# Patient Record
Sex: Male | Born: 1960 | Race: White | Hispanic: No | Marital: Single | Smoking: Former smoker
Health system: Southern US, Community
[De-identification: ages and names within clinical notes are randomized; demographics above are authoritative.]

## PROBLEM LIST (undated history)

## (undated) DIAGNOSIS — F1010 Alcohol abuse, uncomplicated: Secondary | ICD-10-CM

## (undated) DIAGNOSIS — I10 Essential (primary) hypertension: Secondary | ICD-10-CM

## (undated) DIAGNOSIS — I499 Cardiac arrhythmia, unspecified: Secondary | ICD-10-CM

## (undated) DIAGNOSIS — E785 Hyperlipidemia, unspecified: Secondary | ICD-10-CM

## (undated) DIAGNOSIS — K769 Liver disease, unspecified: Secondary | ICD-10-CM

## (undated) DIAGNOSIS — R000 Tachycardia, unspecified: Secondary | ICD-10-CM

## (undated) DIAGNOSIS — E039 Hypothyroidism, unspecified: Secondary | ICD-10-CM

## (undated) DIAGNOSIS — F419 Anxiety disorder, unspecified: Secondary | ICD-10-CM

## (undated) DIAGNOSIS — T7840XA Allergy, unspecified, initial encounter: Secondary | ICD-10-CM

## (undated) DIAGNOSIS — Z87442 Personal history of urinary calculi: Secondary | ICD-10-CM

## (undated) DIAGNOSIS — D510 Vitamin B12 deficiency anemia due to intrinsic factor deficiency: Secondary | ICD-10-CM

## (undated) DIAGNOSIS — K8020 Calculus of gallbladder without cholecystitis without obstruction: Secondary | ICD-10-CM

## (undated) DIAGNOSIS — M1990 Unspecified osteoarthritis, unspecified site: Secondary | ICD-10-CM

## (undated) DIAGNOSIS — F329 Major depressive disorder, single episode, unspecified: Secondary | ICD-10-CM

## (undated) DIAGNOSIS — F32A Depression, unspecified: Secondary | ICD-10-CM

## (undated) DIAGNOSIS — C189 Malignant neoplasm of colon, unspecified: Secondary | ICD-10-CM

## (undated) DIAGNOSIS — E538 Deficiency of other specified B group vitamins: Secondary | ICD-10-CM

## (undated) DIAGNOSIS — I483 Typical atrial flutter: Secondary | ICD-10-CM

## (undated) DIAGNOSIS — K7460 Unspecified cirrhosis of liver: Secondary | ICD-10-CM

## (undated) DIAGNOSIS — N200 Calculus of kidney: Secondary | ICD-10-CM

## (undated) DIAGNOSIS — D649 Anemia, unspecified: Secondary | ICD-10-CM

## (undated) DIAGNOSIS — R12 Heartburn: Secondary | ICD-10-CM

## (undated) DIAGNOSIS — R569 Unspecified convulsions: Secondary | ICD-10-CM

## (undated) HISTORY — DX: Hypothyroidism, unspecified: E03.9

## (undated) HISTORY — DX: Unspecified osteoarthritis, unspecified site: M19.90

## (undated) HISTORY — DX: Hyperlipidemia, unspecified: E78.5

## (undated) HISTORY — DX: Tachycardia, unspecified: R00.0

## (undated) HISTORY — DX: Anxiety disorder, unspecified: F41.9

## (undated) HISTORY — PX: MINOR CARPAL TUNNEL: SHX6472

## (undated) HISTORY — DX: Allergy, unspecified, initial encounter: T78.40XA

## (undated) HISTORY — DX: Heartburn: R12

## (undated) HISTORY — DX: Vitamin B12 deficiency anemia due to intrinsic factor deficiency: D51.0

## (undated) HISTORY — DX: Deficiency of other specified B group vitamins: E53.8

## (undated) HISTORY — DX: Malignant neoplasm of colon, unspecified: C18.9

## (undated) HISTORY — DX: Typical atrial flutter: I48.3

## (undated) HISTORY — DX: Unspecified convulsions: R56.9

## (undated) HISTORY — DX: Unspecified cirrhosis of liver: K74.60

## (undated) HISTORY — DX: Liver disease, unspecified: K76.9

---

## 2003-08-22 ENCOUNTER — Emergency Department (HOSPITAL_COMMUNITY): Admission: EM | Admit: 2003-08-22 | Discharge: 2003-08-22 | Payer: Self-pay | Admitting: *Deleted

## 2008-10-19 ENCOUNTER — Emergency Department (HOSPITAL_COMMUNITY): Admission: EM | Admit: 2008-10-19 | Discharge: 2008-10-19 | Payer: Self-pay | Admitting: Emergency Medicine

## 2010-05-03 LAB — LITHIUM LEVEL: Lithium Lvl: 1.31 mEq/L (ref 0.80–1.40)

## 2010-05-03 LAB — URINALYSIS, ROUTINE W REFLEX MICROSCOPIC
Bilirubin Urine: NEGATIVE
Ketones, ur: NEGATIVE mg/dL
Nitrite: NEGATIVE
Protein, ur: NEGATIVE mg/dL
Urobilinogen, UA: 1 mg/dL (ref 0.0–1.0)

## 2010-05-03 LAB — RAPID URINE DRUG SCREEN, HOSP PERFORMED
Cocaine: NOT DETECTED
Tetrahydrocannabinol: NOT DETECTED

## 2012-06-16 ENCOUNTER — Other Ambulatory Visit: Payer: Self-pay | Admitting: Neurology

## 2012-06-16 DIAGNOSIS — G35 Multiple sclerosis: Secondary | ICD-10-CM

## 2012-06-23 ENCOUNTER — Ambulatory Visit (HOSPITAL_COMMUNITY)
Admission: RE | Admit: 2012-06-23 | Discharge: 2012-06-23 | Disposition: A | Discharge status: Home / Self Care | Payer: Self-pay | Source: Ambulatory Visit | Attending: Neurology | Admitting: Neurology

## 2012-06-23 ENCOUNTER — Encounter (HOSPITAL_COMMUNITY): Payer: Self-pay

## 2012-06-23 DIAGNOSIS — G9389 Other specified disorders of brain: Secondary | ICD-10-CM | POA: Insufficient documentation

## 2012-06-23 DIAGNOSIS — G35 Multiple sclerosis: Secondary | ICD-10-CM

## 2012-06-23 DIAGNOSIS — R209 Unspecified disturbances of skin sensation: Secondary | ICD-10-CM | POA: Insufficient documentation

## 2013-11-03 ENCOUNTER — Other Ambulatory Visit (HOSPITAL_COMMUNITY): Payer: Self-pay | Admitting: Respiratory Therapy

## 2013-11-03 DIAGNOSIS — G4730 Sleep apnea, unspecified: Secondary | ICD-10-CM

## 2015-03-03 ENCOUNTER — Encounter (HOSPITAL_COMMUNITY): Payer: Self-pay | Admitting: Emergency Medicine

## 2015-03-03 ENCOUNTER — Emergency Department (HOSPITAL_COMMUNITY)
Admission: EM | Admit: 2015-03-03 | Discharge: 2015-03-03 | Disposition: A | Discharge status: Home / Self Care | Payer: Self-pay | Attending: Emergency Medicine | Admitting: Emergency Medicine

## 2015-03-03 DIAGNOSIS — K625 Hemorrhage of anus and rectum: Secondary | ICD-10-CM | POA: Insufficient documentation

## 2015-03-03 DIAGNOSIS — Q232 Congenital mitral stenosis: Secondary | ICD-10-CM | POA: Insufficient documentation

## 2015-03-03 DIAGNOSIS — I10 Essential (primary) hypertension: Secondary | ICD-10-CM | POA: Insufficient documentation

## 2015-03-03 HISTORY — DX: Essential (primary) hypertension: I10

## 2015-03-03 NOTE — ED Notes (Signed)
Having rectal bleeding (blood-tinged) in last year.  Pt has stopping taking all other medications because he has read on the Internet that it causes.  Currently on nothing except Xanax, last dose last night.  Had one blood-tinged stool this am, stool not blood.

## 2015-03-08 ENCOUNTER — Emergency Department (HOSPITAL_COMMUNITY)
Admission: EM | Admit: 2015-03-08 | Discharge: 2015-03-08 | Disposition: A | Discharge status: Home / Self Care | Payer: Self-pay | Attending: Emergency Medicine | Admitting: Emergency Medicine

## 2015-03-08 ENCOUNTER — Encounter (HOSPITAL_COMMUNITY): Payer: Self-pay | Admitting: Emergency Medicine

## 2015-03-08 DIAGNOSIS — Q232 Congenital mitral stenosis: Secondary | ICD-10-CM | POA: Insufficient documentation

## 2015-03-08 DIAGNOSIS — K921 Melena: Secondary | ICD-10-CM | POA: Insufficient documentation

## 2015-03-08 DIAGNOSIS — Z880 Allergy status to penicillin: Secondary | ICD-10-CM | POA: Insufficient documentation

## 2015-03-08 DIAGNOSIS — R0789 Other chest pain: Secondary | ICD-10-CM | POA: Insufficient documentation

## 2015-03-08 DIAGNOSIS — R0602 Shortness of breath: Secondary | ICD-10-CM | POA: Insufficient documentation

## 2015-03-08 DIAGNOSIS — I10 Essential (primary) hypertension: Secondary | ICD-10-CM | POA: Insufficient documentation

## 2015-03-08 DIAGNOSIS — Z79899 Other long term (current) drug therapy: Secondary | ICD-10-CM | POA: Insufficient documentation

## 2015-03-08 DIAGNOSIS — F329 Major depressive disorder, single episode, unspecified: Secondary | ICD-10-CM | POA: Insufficient documentation

## 2015-03-08 DIAGNOSIS — R51 Headache: Secondary | ICD-10-CM | POA: Insufficient documentation

## 2015-03-08 DIAGNOSIS — R05 Cough: Secondary | ICD-10-CM | POA: Insufficient documentation

## 2015-03-08 DIAGNOSIS — R509 Fever, unspecified: Secondary | ICD-10-CM | POA: Insufficient documentation

## 2015-03-08 HISTORY — DX: Major depressive disorder, single episode, unspecified: F32.9

## 2015-03-08 HISTORY — DX: Depression, unspecified: F32.A

## 2015-03-08 LAB — CBC WITH DIFFERENTIAL/PLATELET
BASOS PCT: 0 %
Basophils Absolute: 0 10*3/uL (ref 0.0–0.1)
Eosinophils Absolute: 0.2 10*3/uL (ref 0.0–0.7)
Eosinophils Relative: 3 %
HEMATOCRIT: 43.9 % (ref 39.0–52.0)
HEMOGLOBIN: 15.4 g/dL (ref 13.0–17.0)
LYMPHS ABS: 1.7 10*3/uL (ref 0.7–4.0)
LYMPHS PCT: 29 %
MCH: 31.5 pg (ref 26.0–34.0)
MCHC: 35.1 g/dL (ref 30.0–36.0)
MCV: 89.8 fL (ref 78.0–100.0)
MONOS PCT: 9 %
Monocytes Absolute: 0.5 10*3/uL (ref 0.1–1.0)
NEUTROS ABS: 3.4 10*3/uL (ref 1.7–7.7)
NEUTROS PCT: 59 %
Platelets: 180 10*3/uL (ref 150–400)
RBC: 4.89 MIL/uL (ref 4.22–5.81)
RDW: 12.6 % (ref 11.5–15.5)
WBC: 5.8 10*3/uL (ref 4.0–10.5)

## 2015-03-08 LAB — COMPREHENSIVE METABOLIC PANEL
ALBUMIN: 4 g/dL (ref 3.5–5.0)
ALK PHOS: 81 U/L (ref 38–126)
ALT: 21 U/L (ref 17–63)
ANION GAP: 8 (ref 5–15)
AST: 20 U/L (ref 15–41)
BILIRUBIN TOTAL: 0.6 mg/dL (ref 0.3–1.2)
BUN: 14 mg/dL (ref 6–20)
CALCIUM: 8.5 mg/dL — AB (ref 8.9–10.3)
CO2: 27 mmol/L (ref 22–32)
Chloride: 104 mmol/L (ref 101–111)
Creatinine, Ser: 0.88 mg/dL (ref 0.61–1.24)
GFR calc Af Amer: >60 mL/min (ref >60)
GFR calc non Af Amer: >60 mL/min (ref >60)
GLUCOSE: 95 mg/dL (ref 65–99)
Potassium: 3.3 mmol/L — ABNORMAL LOW (ref 3.5–5.1)
Sodium: 139 mmol/L (ref 135–145)
TOTAL PROTEIN: 6.7 g/dL (ref 6.5–8.1)

## 2015-03-08 LAB — LACTATE DEHYDROGENASE: LDH: 124 U/L (ref 98–192)

## 2015-03-08 NOTE — ED Notes (Signed)
Lab at bedside

## 2015-03-08 NOTE — ED Notes (Signed)
PT c/o bright red blood intermittently x8 months and worsening x7 days. PT also states, "I think I have black mold poisoning bc I have symptoms of headache, blood in stool, chronic nausea, flu like symptoms, body aches for over 6 months."

## 2015-03-08 NOTE — ED Provider Notes (Signed)
CSN: [id number]     Arrival date & time 03/08/15  1031 History  By signing my name below, I, Hansel Feinstein, attest that this documentation has been prepared under the direction and in the presence of Merrily Pew, MD. Electronically Signed: Hansel Feinstein, ED Scribe. 03/08/2015. 11:35 AM.    Chief Complaint  Patient presents with  . Rectal Bleeding    The history is provided by the patient. No language interpreter was used.    HPI Comments: Earl [name] is a 55 y.o. male with h/o congenital mitral stenosis, HTN who presents to the Emergency Department complaining of mild, intermittent bloody stools for 8 months. Last episode yesterday. Pt states he sees a small amount of blood-streaking in his stools and occasionally sees blood on toilet paper. Pt attributes his symptoms to SSRIs and BP medication, and notes resolution of symptoms after he discontinues these medications. He states he is not happy with his BP medication because his BP is occasionally low. Pt has not previously been evaluated for his current symptoms. No h/o hemorrhoids, diverticulosis. No FHx of colon cancer. He also complains of persistent cough, mild SOB, chest tightness with deep inhalation, constant mild HA, subjective fever. He notes these symptoms have been ongoing for 6 months and reports he believes he has been exposed to toxic mold. Pt reports he has seen mold in his home and is in the process of having the mold removed. Pt is not currently followed by a PCP. He is followed by neurology. He denies CP, abdominal pain.   Past Medical History  Diagnosis Date  . MS (congenital mitral stenosis)   . Hypertension   . Irregular heart beat   . Depression    Past Surgical History  Procedure Laterality Date  . Minor carpal tunnel      right   History reviewed. No pertinent family history. Social History  Substance Use Topics  . Smoking status: Former Smoker    Quit date: 03/03/1979  . Smokeless tobacco: None  . Alcohol Use:  4.8 oz/week    8 Cans of beer per week     Comment: drinks heavily at times    Review of Systems  Constitutional: Positive for fever ( subjective).  Respiratory: Positive for cough, chest tightness and shortness of breath.   Cardiovascular: Negative for chest pain.  Gastrointestinal: Positive for blood in stool. Negative for abdominal pain.  Neurological: Positive for headaches.  All other systems reviewed and are negative.  Allergies  Amoxicillin and Asa  Home Medications   Prior to Admission medications   Medication Sig Start Date End Date Taking? Authorizing Provider  ALPRAZolam Duanne Moron) 1 MG tablet Take 1 mg by mouth 4 (four) times daily as needed for anxiety.   Yes Historical Provider, MD  cyanocobalamin (,VITAMIN B-12,) 1000 MCG/ML injection Inject 1,000 mcg into the muscle once.   Yes Historical Provider, MD  gabapentin (NEURONTIN) 600 MG tablet Take 600 mg by mouth 3 (three) times daily.   Yes Historical Provider, MD  Vitamin D, Ergocalciferol, (DRISDOL) 50000 units CAPS capsule Take 50,000 Units by mouth every 7 (seven) days.   Yes Historical Provider, MD  zinc gluconate 50 MG tablet Take 25 mg by mouth once a week.   Yes Historical Provider, MD   BP 116/93 mmHg  Pulse 68  Temp(Src) 97.5 F (36.4 C) (Oral)  Resp 16  Ht 5\' 10"  (1.778 m)  Wt 175 lb (79.379 kg)  BMI 25.11 kg/m2  SpO2 100% Physical Exam  Constitutional: He is oriented to person, place, and time. He appears well-developed and well-nourished.  HENT:  Head: Normocephalic and atraumatic.  Eyes: Conjunctivae and EOM are normal. Pupils are equal, round, and reactive to light.  Neck: Normal range of motion. Neck supple.  Cardiovascular: Normal rate, regular rhythm and normal heart sounds.  Exam reveals no gallop and no friction rub.   No murmur heard. Pulmonary/Chest: Effort normal and breath sounds normal. No respiratory distress. He has no wheezes. He has no rales.  Lungs CTA bilaterally.   Abdominal:  Soft. Bowel sounds are normal. He exhibits no distension and no mass. There is no tenderness. There is no rebound and no guarding.  Genitourinary: Prostate normal.  Chaperone present throughout entire exam.  No gross blood, no hemorrhoids, no fissures.   Musculoskeletal: Normal range of motion.  Neurological: He is alert and oriented to person, place, and time.  Skin: Skin is warm and dry.  Psychiatric: He has a normal mood and affect. His behavior is normal.  Nursing note and vitals reviewed.   ED Course  Procedures (including critical care time) DIAGNOSTIC STUDIES: Oxygen Saturation is 98% on RA, normal by my interpretation.    COORDINATION OF CARE: 11:33 AM Discussed treatment plan with pt at bedside and pt agreed to plan.   Labs Review Labs Reviewed  COMPREHENSIVE METABOLIC PANEL - Abnormal; Notable for the following:    Potassium 3.3 (*)    Calcium 8.5 (*)    All other components within normal limits  CBC WITH DIFFERENTIAL/PLATELET  LACTATE DEHYDROGENASE  POCT GASTRIC OCCULT BLOOD (1-CARD TO LAB)  POC OCCULT BLOOD, ED    Imaging Review No results found. I have personally reviewed and evaluated these images and lab results as part of my medical decision-making.  MDM   Final diagnoses:  Blood in stool    Patient multiple complaints which she thinks are all related to toxic mold. However here his main reason for coming in his rectal bleeding. He has intermittent episodes of bright red blood on his stool studies on his toilet paper after wiping. No weakness, she was breath, chest pain no, light headedness or feeling like to pass out. Labs and rest of exam as above. Suspect possible irritation as a cause of his bleeding since his Hemoccult here is negative. He will obtain PCP follow-up for further evaluation of these symptoms. Patient is also 55 years old and needs a colonoscopy so we'll also try to get that done as well in [place] where he's from, i have low suspicion for  inflammatory bowel disease, neoplasm at this time however these are possibilities and is why he needs close follow-up.  I personally performed the services described in this documentation, which was scribed in my presence. The recorded information has been reviewed and is accurate.    Merrily Pew, MD 03/08/15 1539

## 2015-03-08 NOTE — Discharge Instructions (Signed)
°Emergency Department Resource Guide °1) Find a Doctor and Pay Out of Pocket °Although you won't have to find out who is covered by your insurance plan, it is a good idea to ask around and get recommendations. You will then need to call the office and see if the doctor you have chosen will accept you as a new patient and what types of options they offer for patients who are self-pay. Some doctors offer discounts or will set up payment plans for their patients who do not have insurance, but you will need to ask so you aren't surprised when you get to your appointment. ° °2) Contact Your Local Health Department °Not all health departments have doctors that can see patients for sick visits, but many do, so it is worth a call to see if yours does. If you don't know where your local health department is, you can check in your phone book. The CDC also has a tool to help you locate your state's health department, and many state websites also have listings of all of their local health departments. ° °3) Find a Walk-in Clinic °If your illness is not likely to be very severe or complicated, you may want to try a walk in clinic. These are popping up all over the country in pharmacies, drugstores, and shopping centers. They're usually staffed by nurse practitioners or physician assistants that have been trained to treat common illnesses and complaints. They're usually fairly quick and inexpensive. However, if you have serious medical issues or chronic medical problems, these are probably not your best option. ° °No Primary Care Doctor: °- Call [facility] at  832-8000 - they can help you locate a primary care doctor that  accepts your insurance, provides certain services, etc. °- Physician Referral Service- 1-800-533-3463 ° °Chronic Pain Problems: °Organization         Address  Phone   Notes  °[facility]  (336) 297-2271 Patients need to be referred by their primary care doctor.  ° °Medication  Assistance: °Organization         Address  Phone   Notes  °[place] [facility] [address] °[place], [place] [address] (336) 641-8030 --Must be a resident of [place] °-- Must have NO insurance coverage whatsoever (no Medicaid/ Medicare, etc.) °-- The pt. MUST have a primary care doctor that directs their care regularly and follows them in the community °  °[facility]  (866) 331-1348   °[employer]  (888) 892-1162   ° °Agencies that provide inexpensive medical care: °Organization         Address  Phone   Notes  °[facility]  (336) 832-8035   °[facility]    (336) 832-7272   °[facility] [address] °[place], [place] [address] (336) 832-4777   °[facility] [address], °[place] (336) 271-4999   °[facility]    (336) 373-0678   °[facility]    (336) 272-1050   °[facility] ° [address], [place] Phone:  (336) 832-4444, Fax:  (336) 832-4440 Hours of Operation:  9 am - 6 pm, M-F.  Also accepts Medicaid/Medicare and self-pay.  °[facility] ° [address], [place] Phone: (336) 832-3150, Fax: (336) [phone]. Hours of Operation:  8:30 am - 5:30 pm, M-F.  Also accepts Medicaid and self-pay.  °[facility] [place] [address], [place] Phone: (336) 878-6027   °[facility] [address], [place], [place] (336)723-1848, Ext. 123 Mondays & Thursdays: 7-9 AM.  First 15 patients are seen on a first come, first serve basis. °  ° °Medicaid-accepting [place] Providers: ° °Organization         Address  Phone   Notes  °[facility] [address], [place] (336) 641-2100 Also accepts self-pay patients.  °[facility] [address], [place] ° (336) 856-9996   °[facility] [address], [place]  (336) 288-8857   °[facility] [address] [place] [address], [place] (336) 299-7000   °Veita Bland [address], [place]  ° (336) 373-1557 Only accepts [place] Access Medicaid patients after they have their name applied to their card.  ° °Self-Pay (no insurance) in [place]: ° °Organization         Address  Phone   Notes  °Sickle Cell Patients, [facility] [address], [place] (336) 832-1970   °[facility] [address], [place] (336) 832-4400   °[facility] ° [address], [place] (336) 992-4800   °[facility]/Dr. Osei-Bonsu ° [address] [place] [address], [place] or [address], [place] (336) 841-8500 Phone number for both [place] and [place] locations is the same.  °[facility] [address], [place] (336) 299-0000   °[facility] [address] [place] [address], [place] or [address] (336) 852-7530 °(336) 878-2260   °[facility] [address], [place] (336) 350-1642, phone; (336) 294-5005, fax Sees patients 1st and 3rd Saturday of every month.  Must not qualify for public or private insurance (i.e. Medicaid, Medicare, [place] Health Choice, Veterans' Benefits) • Household income should be no more than 200% of the poverty level •The clinic cannot treat you if you are pregnant or think you are pregnant • Sexually transmitted diseases are not treated at the clinic.  ° ° °Dental Care: °Organization         Address  Phone  Notes  °[place] [facility] [address], [place] (336) 641-6152 Accepts children up to age 21 who are enrolled in Medicaid or [place] Health Choice; pregnant women with a Medicaid card; and children who have applied for Medicaid or [place] Health Choice, but were declined, whose parents can pay a reduced fee at time of service.  °[place]  [facility] [place]  [address], [place] (336) 641-7733 Accepts children up to age 21 who are enrolled in Medicaid or [place] Health Choice; pregnant women with a Medicaid card; and children who have applied for Medicaid or [place] Health Choice, but were declined, whose parents can pay a reduced fee at time of service.  °[facility] ° [address], [place] (336) 641-4533 Patients are seen by appointment only. Walk-ins are not accepted. [facility] will see patients 18 years of age and older. °Monday - Tuesday (8am-5pm) °Most Wednesdays (8:30-5pm) °$30 per visit, cash only  °[facility] ° [address], [place] (336) 641-4533 Patients are seen by appointment only. Walk-ins are not accepted. [facility] will see patients 18 years of age and older. °One   Wednesday Evening (Monthly: Volunteer Based).  $30 per visit, cash only  °[facility]  (919) 537-3737 for adults; Children under age 4, call Graduate Pediatric Dentistry at (919) [phone]. Children aged 4-14, please call (919) [phone] to request a pediatric application. ° Dental services are provided in all areas of dental care including fillings, crowns and bridges, complete and partial dentures, implants, gum treatment, root canals, and extractions. Preventive care is also provided. Treatment is provided to both adults and children. °Patients are selected via a lottery and there is often a waiting list. °  °[facility] [address], °[place] ° (336) 763-8833 [email or web address] °  °[facility] [address], [place], [place] (336)723-1848, Ext. 123 Second and Fourth Thursday of each month, opens at 6:30 AM; Clinic ends at 9 AM.  Patients are seen on a first-come first-served basis, and a limited number are seen during each clinic.  ° °[facility] ° [address], [place], [place] (336) 723-7904    Eligibility Requirements °You must have lived in [place], [place], or [place] counties for at least the last three months. °  You cannot be eligible for state or federal sponsored healthcare insurance, including Veterans Administration, Medicaid, or Medicare. °  You generally cannot be eligible for healthcare insurance through your employer.  °  How to apply: °Eligibility screenings are held every Tuesday and Wednesday afternoon from 1:00 pm until 4:00 pm. You do not need an appointment for the interview!  °[facility] [address], [place], [place] 336-631-2330   °[facility]  336-342-8273   °[facility]  336-703-3100   °[facility]  336-570-6415   ° °Behavioral Health Resources in the Community: °Intensive Outpatient Programs °Organization         Address  Phone  Notes  °[facility] [address], [place], [place] 336-878-6098   °[facility] [address], [place], [place] 336-832-9800   °[facility] [address], [place], [place] ° 336-882-2125   °[facility] [address],  °[place], [place] 1-800-853-5163 or 336-641-4981   °Substance Abuse Resources °Organization         Address  Phone  Notes  °[facility]  336-882-2125   °[facility]  336-784-9470   °[facility]  336-285-9073   °[facility]  336-845-3988   °Residential & Outpatient Substance Abuse Program  1-800-659-3381   °Psychological Services °Organization         Address  Phone  Notes  °[facility]  336- 832-9600   °[facility]  336- 378-7881   °[facility] [address], [place] 1-800-853-5163 or 336-641-4981   ° °Mobile Crisis Teams °Organization         Address  Phone  Notes  °[facility], Mobile Crisis Care Unit  1-877-626-1772   °Assertive °[facility] ° [address]  [place], [place] 336-834-9664   °Sharon DeEsch [address] °[place] 336-554-5454   ° °Self-Help/Support Groups °Organization         Address  Phone             Notes  °[facility] - variety of support groups  336- 373-1402 Call for more information  °Narcotics Anonymous (NA), [facility] [address], °[place]  2 meetings at this location  ° °  Residential Treatment Programs °Organization         Address  Phone  Notes  °[facility] [address],    °[place]  1-866-801-8205   °[facility] ° [address], [place], [place] 704-293-8524   °[facility] [address], [place] 336-845-3988 Admissions: 8am-3pm M-F  °[facility] [address]    °[place], [place] 336-841-1104   °[facility] [address], [place], [place] 336-379-7146   °[facility] [address]  °[place], [place] 336-285-9073   °[facility] - Intensive Outpatient [address], [place], [place] 336-852-3033   °[facility] [address]  °[place], [place] 1-877-615-2722 or 336-784-9470   °[facility] ([facility] [address] , [place] 336-227-7417 Accepts Medicaid  °[facility] [address]  °[place] 1-800-659-3381 Substance Abuse/Addiction Treatment  ° °[place] Behavioral Health Resources °Organization         Address  Phone  Notes  °[facility]  (888) 581-9988   °Julie Brannon, PhD [address] [place], [place]   (336) 349-5553 or (336) 951-0000   °[facility]   [address] °[place], [place] (336) 349-4454   °[facility] [address], [place], [place] (336) 342-8316 Insurance/Medicaid/sponsorship through [facility]  °[facility] [address]                                    [place], [place] (336) 342-8316 Therapy/tele-psych/case    °[facility] [address]  ° [place], [place] (336) 349-2233    °Dr. Arfeen  (336) 349-4544   °[facility] [place]  [employer] [place] [facility] 1) [address], [place] °2) [address], [place] °3)  [address], [place] (336) 349-3220 °(336) 342-7768 ° °(336) 342-8140   °[place] [facility] (336) 342-1394 or (336) 342-3537 (After Hours)    ° ° °

## 2015-03-08 NOTE — ED Notes (Signed)
MD at bedside. 

## 2015-03-08 NOTE — ED Notes (Signed)
Occult Blood Stool is NEGATIVE

## 2015-03-09 LAB — POC OCCULT BLOOD, ED: Fecal Occult Bld: NEGATIVE

## 2015-06-29 ENCOUNTER — Emergency Department (HOSPITAL_COMMUNITY): Payer: Medicaid Other

## 2015-06-29 ENCOUNTER — Emergency Department (HOSPITAL_COMMUNITY)
Admission: EM | Admit: 2015-06-29 | Discharge: 2015-06-29 | Disposition: A | Discharge status: Home / Self Care | Payer: Medicaid Other | Attending: Emergency Medicine | Admitting: Emergency Medicine

## 2015-06-29 ENCOUNTER — Encounter (HOSPITAL_COMMUNITY): Payer: Self-pay | Admitting: *Deleted

## 2015-06-29 DIAGNOSIS — Z79899 Other long term (current) drug therapy: Secondary | ICD-10-CM | POA: Diagnosis not present

## 2015-06-29 DIAGNOSIS — S0083XA Contusion of other part of head, initial encounter: Secondary | ICD-10-CM | POA: Insufficient documentation

## 2015-06-29 DIAGNOSIS — Y999 Unspecified external cause status: Secondary | ICD-10-CM | POA: Insufficient documentation

## 2015-06-29 DIAGNOSIS — S0093XA Contusion of unspecified part of head, initial encounter: Secondary | ICD-10-CM | POA: Insufficient documentation

## 2015-06-29 DIAGNOSIS — Y929 Unspecified place or not applicable: Secondary | ICD-10-CM | POA: Diagnosis not present

## 2015-06-29 DIAGNOSIS — S0011XA Contusion of right eyelid and periocular area, initial encounter: Secondary | ICD-10-CM | POA: Diagnosis not present

## 2015-06-29 DIAGNOSIS — F329 Major depressive disorder, single episode, unspecified: Secondary | ICD-10-CM | POA: Diagnosis not present

## 2015-06-29 DIAGNOSIS — Y9389 Activity, other specified: Secondary | ICD-10-CM | POA: Diagnosis not present

## 2015-06-29 DIAGNOSIS — S0990XA Unspecified injury of head, initial encounter: Secondary | ICD-10-CM | POA: Diagnosis present

## 2015-06-29 DIAGNOSIS — I10 Essential (primary) hypertension: Secondary | ICD-10-CM | POA: Diagnosis not present

## 2015-06-29 DIAGNOSIS — Z87891 Personal history of nicotine dependence: Secondary | ICD-10-CM | POA: Insufficient documentation

## 2015-06-29 DIAGNOSIS — W1809XA Striking against other object with subsequent fall, initial encounter: Secondary | ICD-10-CM | POA: Insufficient documentation

## 2015-06-29 NOTE — Discharge Instructions (Signed)
Follow up with your md as needed.  Take tylenol or motrin for pain

## 2015-06-29 NOTE — ED Notes (Signed)
Pt comes in by EMS for fall. Per had 2 fall during the night while [facility]. Pt has hematoma to posterior head and his left forehead, cheek and nose. Pt is alert and oriented at this time. NAD noted

## 2015-06-29 NOTE — ED Provider Notes (Signed)
CSN: [id number]     Arrival date & time 06/29/15  [id number] History  By signing my name below, I, Earl [name], attest that this documentation has been prepared under the direction and in the presence of Earl Ferguson, MD.   Electronically Signed: Nicole [name], ED Scribe. 06/29/2015. 10:22 AM   Chief Complaint  Patient presents with  . Fall    Patient is a 55 y.o. male presenting with fall. The history is provided by the patient. No language interpreter was used.  Fall This is a chronic problem. The current episode started 12 to 24 hours ago. Episode frequency: Pt says he has a history of falls. The problem has been resolved. Pertinent negatives include no chest pain, no abdominal pain and no headaches. Nothing aggravates the symptoms. Nothing relieves the symptoms. He has tried nothing for the symptoms.   HPI Comments: Earl [name] is a 55 y.o. male who presents to the Emergency Department complaining of sudden onset, head injury s/p fall last night after having a syncopal episode. Pt states he has hx of syncope which is being treated by Dr. Merlene Laughter. Pt reports associated posterior head and left forehead hematoma. He also complains of right eye ecchymosis, and swelling of his nose. He states "this is the hardest I have hit my head when I have fallen". No other associated symptoms. No worsening or alleviating factors noted. Pt denies neck pain, back pain, or any other pertinent symptoms. Pt is UTD on his tetanus.  Past Medical History  Diagnosis Date  . MS (congenital mitral stenosis)   . Hypertension   . Irregular heart beat   . Depression    Past Surgical History  Procedure Laterality Date  . Minor carpal tunnel      right   No family history on file. Social History  Substance Use Topics  . Smoking status: Former Smoker    Quit date: 03/03/1979  . Smokeless tobacco: None  . Alcohol Use: 4.8 oz/week    8 Cans of beer per week     Comment: drinks heavily at times     Review of Systems  Constitutional: Negative for appetite change and fatigue.  HENT: Negative for congestion, ear discharge and sinus pressure.        Swelling noted to nose.  Eyes: Negative for discharge.  Respiratory: Negative for cough.   Cardiovascular: Negative for chest pain.  Gastrointestinal: Negative for abdominal pain and diarrhea.  Genitourinary: Negative for frequency and hematuria.  Musculoskeletal: Negative for back pain and neck pain.  Skin: Negative for rash.       Posterior head and left forehead hematoma. Right eye ecchymosis.  Neurological: Negative for seizures and headaches.  Psychiatric/Behavioral: Negative for hallucinations.     Allergies  Amoxicillin and Asa  Home Medications   Prior to Admission medications   Medication Sig Start Date End Date Taking? Authorizing Provider  ALPRAZolam Duanne Moron) 1 MG tablet Take 1 mg by mouth 4 (four) times daily as needed for anxiety.    Historical Provider, MD  cyanocobalamin (,VITAMIN B-12,) 1000 MCG/ML injection Inject 1,000 mcg into the muscle once.    Historical Provider, MD  gabapentin (NEURONTIN) 600 MG tablet Take 600 mg by mouth 3 (three) times daily.    Historical Provider, MD  Vitamin D, Ergocalciferol, (DRISDOL) 50000 units CAPS capsule Take 50,000 Units by mouth every 7 (seven) days.    Historical Provider, MD  zinc gluconate 50 MG tablet Take 25 mg by mouth once a week.  Historical Provider, MD   BP 158/99 mmHg  Pulse 94  Temp(Src) 97.1 F (36.2 C) (Oral)  Resp 16  Ht 5\' 10"  (1.778 m)  Wt 25 lb (11.34 kg)  BMI 3.59 kg/m2  SpO2 100% Physical Exam  Constitutional: He is oriented to person, place, and time. He appears well-developed.  HENT:  Head: Normocephalic.  Ecchymosis to right eye and forehead. Swelling to his nose. Dried blood to both nares.  Eyes: Conjunctivae and EOM are normal. No scleral icterus.  Neck: Neck supple. No thyromegaly present.  Cardiovascular: Normal rate and regular  rhythm.  Exam reveals no gallop and no friction rub.   No murmur heard. Pulmonary/Chest: No stridor. He has no wheezes. He has no rales. He exhibits no tenderness.  Abdominal: He exhibits no distension. There is no tenderness. There is no rebound.  Musculoskeletal: Normal range of motion. He exhibits no edema.  Lymphadenopathy:    He has no cervical adenopathy.  Neurological: He is oriented to person, place, and time. He exhibits normal muscle tone. Coordination normal.  Skin: Ecchymosis noted. No rash noted. No erythema.  Psychiatric: He has a normal mood and affect. His behavior is normal.    ED Course  Procedures (including critical care time) DIAGNOSTIC STUDIES: Oxygen Saturation is 100% on RA, normal by my interpretation.    COORDINATION OF CARE: 10:22 AM Discussed treatment plan with pt at bedside and pt agreed to plan.  Labs Review Labs Reviewed - No data to display  Imaging Review No results found. I have personally reviewed and evaluated these images and lab results as part of my medical decision-making.   EKG Interpretation None      MDM   Final diagnoses:  None   Patient has CT of the head and cervical spine and facial bones which did not show any significant injury. He will be discharged home and will take Tylenol or Motrin for pain and follow-up with his PCP.j   The chart was scribed for me under my direct supervision.  I personally performed the history, physical, and medical decision making and all procedures in the evaluation of this patient.Earl Ferguson, MD 06/29/15 [phone]

## 2015-08-30 ENCOUNTER — Encounter: Payer: Self-pay | Admitting: Pediatrics

## 2015-08-30 ENCOUNTER — Ambulatory Visit (INDEPENDENT_AMBULATORY_CARE_PROVIDER_SITE_OTHER): Payer: Medicaid Other | Admitting: Pediatrics

## 2015-08-30 VITALS — BP 155/99 | HR 70 | Temp 97.1°F | Ht 70.0 in | Wt 224.2 lb

## 2015-08-30 DIAGNOSIS — R930 Abnormal findings on diagnostic imaging of skull and head, not elsewhere classified: Secondary | ICD-10-CM | POA: Diagnosis not present

## 2015-08-30 DIAGNOSIS — G629 Polyneuropathy, unspecified: Secondary | ICD-10-CM | POA: Diagnosis not present

## 2015-08-30 DIAGNOSIS — R9089 Other abnormal findings on diagnostic imaging of central nervous system: Secondary | ICD-10-CM

## 2015-08-30 DIAGNOSIS — Z6832 Body mass index (BMI) 32.0-32.9, adult: Secondary | ICD-10-CM | POA: Diagnosis not present

## 2015-08-30 DIAGNOSIS — E538 Deficiency of other specified B group vitamins: Secondary | ICD-10-CM | POA: Diagnosis not present

## 2015-08-30 DIAGNOSIS — I10 Essential (primary) hypertension: Secondary | ICD-10-CM | POA: Diagnosis not present

## 2015-08-30 DIAGNOSIS — Z1211 Encounter for screening for malignant neoplasm of colon: Secondary | ICD-10-CM | POA: Diagnosis not present

## 2015-08-30 DIAGNOSIS — K625 Hemorrhage of anus and rectum: Secondary | ICD-10-CM | POA: Diagnosis not present

## 2015-08-30 DIAGNOSIS — Z6834 Body mass index (BMI) 34.0-34.9, adult: Secondary | ICD-10-CM | POA: Insufficient documentation

## 2015-08-30 MED ORDER — AMLODIPINE BESYLATE 5 MG PO TABS: 5.0000 mg | ORAL_TABLET | Freq: Every day | ORAL | 3 refills | Status: DC

## 2015-08-30 NOTE — Progress Notes (Signed)
Subjective:    Patient ID: Earl [name], male    DOB: Nov 14, 1960, 55 y.o.   MRN: [id number]  CC: New Patient (Initial Visit) (Hypertension)   HPI: Earl [name] is a 55 y.o. male presenting for New Patient (Initial Visit) (Hypertension)  HTN: ongoing problem Says he was on a diuretic that caused bleeding in urine and stool per pt, bright orange urine, burning Has been on hydrochlorothiazide, he thinks that is the medicine that helped Has cuff at home, getting high, sometimes 180s/110s Has been taking prednisone for clonidine reaction, having dry mouth, sore throat, then pneumonia Was given bactrim to help with chest symptoms Off of clonidine for past 3-4 weeks Not on any BP meds now No headaches, no vision changes No chest pain, no SOB or CP with exertion  Seeing a neurologist for syncope and neuropathy in legs/feet On gabapentin for neuropathy Had MRI that was abnormal, possibly MS vs microvascular ischemic changes Neurologist prescribing xanax  Getting B12 shots regularly for B12 def   Depression screen [facility] 2/9 08/30/2015  Decreased Interest 0  Down, Depressed, Hopeless 0  PHQ - 2 Score 0   Fam hx: no colon ca, HTN in father Mother-HLD  Past Medical History:  Diagnosis Date  . Depression   . Hypertension   . Irregular heart beat   . MS (congenital mitral stenosis)     History  Smoking Status  . Former Smoker  . Quit date: 03/03/1979  Smokeless Tobacco  . Never Used   EtOH, usually no more than 1-2 glasses at a time At most a bottle of wine once a month in a day  ROS: All systems negative other than what is in HPI     Objective:    BP (!) 155/99 (BP Location: Left Arm, Cuff Size: Large)   Pulse 70   Temp 97.1 F (36.2 C) (Oral)   Ht 5' 10"  (1.778 m)   Wt 224 lb 3.2 oz (101.7 kg)   BMI 32.17 kg/m   Wt Readings from Last 3 Encounters:  08/30/15 224 lb 3.2 oz (101.7 kg)  06/29/15 25 lb (11.3 kg)  03/08/15 175 lb (79.4 kg)     Gen: NAD,  alert, cooperative with exam, NCAT EYES: EOMI, no scleral injection or icterus ENT:  TMs obscurred with cerumen b/l OP without erythema LYMPH: no cervical LAD CV: NRRR, normal S1/S2, no murmur, distal pulses 2+ b/l Resp: CTABL, no wheezes, normal WOB Ext: No edema, warm Neuro: Alert and oriented, strength equal b/l UE and LE, coordination grossly normal MSK: normal muscle bulk     Assessment & Plan:    Demario was seen today for hypertension.  Diagnoses and all orders for this visit:  Essential hypertension Uncontrolled, not on any medicines Start below, let me know if still elevated in 5 days, will increase to 75m RTC 2 weeks for recheck -     amLODipine (NORVASC) 5 MG tablet; Take 1 tablet (5 mg total) by mouth daily. -     BMP8+EGFR  Screening for colon cancer Rectal bleeding, BRB, back in Feb, seen in ED for it. Neg hemoccult at that time No recent bleeding Due for screening colonoscopy -     Ambulatory referral to Gastroenterology  Neuropathy ([facility] -     Ambulatory referral to Neurology  Rectal bleeding Due for screening colonoscopy, ordered  Abnormal brain MRI Followed by neurology, possibly MS as cause of neuropathy symptoms per pt   Follow up plan: Return  in about 2 weeks (around 09/13/2015) for recheck meds/BP.  [name], MD [facility] 08/30/2015, 9:00 AM

## 2015-08-31 ENCOUNTER — Telehealth: Payer: Self-pay | Admitting: Pediatrics

## 2015-08-31 ENCOUNTER — Telehealth: Payer: Self-pay | Admitting: *Deleted

## 2015-08-31 LAB — BMP8+EGFR
BUN/Creatinine Ratio: 14 (ref 9–20)
BUN: 13 mg/dL (ref 6–24)
CO2: 24 mmol/L (ref 18–29)
CREATININE: 0.92 mg/dL (ref 0.76–1.27)
Calcium: 9 mg/dL (ref 8.7–10.2)
Chloride: 102 mmol/L (ref 96–106)
GFR, EST AFRICAN AMERICAN: 107 mL/min/{1.73_m2} (ref >59)
GFR, EST NON AFRICAN AMERICAN: 93 mL/min/{1.73_m2} (ref >59)
Glucose: 82 mg/dL (ref 65–99)
POTASSIUM: 4.4 mmol/L (ref 3.5–5.2)
SODIUM: 141 mmol/L (ref 134–144)

## 2015-08-31 NOTE — Telephone Encounter (Signed)
Pt called to inform BP is 190/128 Heart rate is 110-120 Denies cp or sob Pt instructed to go to ED Verbalizes understanding

## 2015-09-03 ENCOUNTER — Other Ambulatory Visit: Payer: Self-pay | Admitting: Pediatrics

## 2015-09-03 ENCOUNTER — Telehealth: Payer: Self-pay | Admitting: Pediatrics

## 2015-09-03 DIAGNOSIS — I10 Essential (primary) hypertension: Secondary | ICD-10-CM

## 2015-09-03 MED ORDER — AMLODIPINE BESYLATE 10 MG PO TABS: 10.0000 mg | ORAL_TABLET | Freq: Every day | ORAL | 0 refills | Status: DC

## 2015-09-03 NOTE — Telephone Encounter (Signed)
That's fine, I sent in the refill. Should be on 10mg  amlodipine once a day then.

## 2015-09-03 NOTE — Telephone Encounter (Signed)
What are his blood pressures at home?

## 2015-09-03 NOTE — Telephone Encounter (Signed)
Pt aware of rx and to cont to monitor

## 2015-09-03 NOTE — Telephone Encounter (Signed)
Dr Evette Doffing, He states that on day 3 of the new med - he started taking 2 pills a day - now on the 2 pills a day, he is still running about 130-140 over 90 about every time he checks the BP  Please advise

## 2015-09-05 ENCOUNTER — Encounter (INDEPENDENT_AMBULATORY_CARE_PROVIDER_SITE_OTHER): Payer: Self-pay | Admitting: *Deleted

## 2015-09-14 ENCOUNTER — Ambulatory Visit (INDEPENDENT_AMBULATORY_CARE_PROVIDER_SITE_OTHER): Payer: Medicaid Other | Admitting: Pediatrics

## 2015-09-14 ENCOUNTER — Encounter: Payer: Self-pay | Admitting: Pediatrics

## 2015-09-14 VITALS — BP 130/86 | HR 79 | Temp 97.6°F | Ht 70.0 in | Wt 225.6 lb

## 2015-09-14 DIAGNOSIS — E538 Deficiency of other specified B group vitamins: Secondary | ICD-10-CM

## 2015-09-14 DIAGNOSIS — Z6832 Body mass index (BMI) 32.0-32.9, adult: Secondary | ICD-10-CM

## 2015-09-14 DIAGNOSIS — I10 Essential (primary) hypertension: Secondary | ICD-10-CM

## 2015-09-14 MED ORDER — LISINOPRIL 20 MG PO TABS: 20.0000 mg | ORAL_TABLET | Freq: Every day | ORAL | 3 refills | Status: DC

## 2015-09-14 NOTE — Patient Instructions (Signed)
Goal blood pressure <140 on top, <90 on bottom

## 2015-09-14 NOTE — Progress Notes (Signed)
  Subjective:   Patient ID: Earl [name], male    DOB: 1960-06-30, 55 y.o.   MRN: [id number] CC: Follow-up (2 week)  HPI: Earl [name] is a 55 y.o. male presenting for Follow-up (2 week)  Taking 0.'1mg'$  clonidine BID now Tried up to '10mg'$  of amlodipine for 10 days, no change in BP so went back to clonidine  Now on clonidine alone Dry mouth ongoing No headaches Just dry mouth BPs better controlled at home Remains anxious Does not want to try SNRI again, had bad reaction Hesitant about SSRIs On xanax Rx by neurologist for possible MS  GAD 7 : Generalized Anxiety Score 09/14/2015  Nervous, Anxious, on Edge 1  Control/stop worrying 3  Worry too much - different things 3  Trouble relaxing 3  Restless 2  Easily annoyed or irritable 3  Afraid - awful might happen 3  Total GAD 7 Score 18  Anxiety Difficulty Very difficult    Depression screen [facility] 2/9 09/14/2015 08/30/2015  Decreased Interest 3 0  Down, Depressed, Hopeless 2 0  PHQ - 2 Score 5 0  Altered sleeping 3 -  Tired, decreased energy 1 -  Change in appetite 3 -  Feeling bad or failure about yourself  1 -  Trouble concentrating 1 -  Moving slowly or fidgety/restless 2 -  Suicidal thoughts 0 -  PHQ-9 Score 16 -  Difficult doing work/chores Very difficult -     Relevant past medical, surgical, family and social history reviewed. Allergies and medications reviewed and updated. History  Smoking Status  . Former Smoker  . Quit date: 03/03/1979  Smokeless Tobacco  . Never Used   ROS: Per HPI   Objective:    BP 130/86   Pulse 79   Temp 97.6 F (36.4 C) (Oral)   Ht '5\' 10"'$  (1.778 m)   Wt 225 lb 9.6 oz (102.3 kg)   BMI 32.37 kg/m   Wt Readings from Last 3 Encounters:  09/14/15 225 lb 9.6 oz (102.3 kg)  08/30/15 224 lb 3.2 oz (101.7 kg)  06/29/15 25 lb (11.3 kg)    Gen: NAD, alert, cooperative with exam, NCAT EYES: EOMI, no conjunctival injection, or no icterus CV: NRRR, normal S1/S2, no murmur, distal pulses  2+ b/l Resp: CTABL, no wheezes, normal WOB Ext: No edema, warm Neuro: Alert and oriented, strength equal b/l UE and LE, coordination grossly normal MSK: normal muscle bulk Psych: normal affect  Assessment & Plan:  Earl [name] was seen today for follow-up multiple med problems.  Diagnoses and all orders for this visit:  Essential hypertension Amlodipine didn't help his BP per pt Doesn't like s/e of clonidine Will start lisinopril -     lisinopril (PRINIVIL,ZESTRIL) 20 MG tablet; Take 1 tablet (20 mg total) by mouth daily. -     BMP8+EGFR; Future in 4 weeks  BMI 32.0-32.9,adult Continue lifestyle changes, increase physical activity and diet changes  Vitamin B12 deficiency Has been low in past, was on injections, last injection several months ago -     Vitamin B12  Anxiety Discussed options, ideally would see counselor Pt hesitant now Not interested in medication  Follow up plan: Return in about 3 months (around 12/15/2015). [name], MD [place]

## 2015-09-15 LAB — VITAMIN B12: VITAMIN B 12: 261 pg/mL (ref 211–946)

## 2015-09-17 ENCOUNTER — Other Ambulatory Visit: Payer: Self-pay | Admitting: *Deleted

## 2015-09-17 DIAGNOSIS — E538 Deficiency of other specified B group vitamins: Secondary | ICD-10-CM

## 2015-09-17 MED ORDER — CYANOCOBALAMIN 1000 MCG/ML IJ SOLN
1000.0000 ug | INTRAMUSCULAR | Status: DC
  Administered 2015-09-20 – 2015-12-31 (×4): 1000 ug via INTRAMUSCULAR

## 2015-09-18 ENCOUNTER — Telehealth: Payer: Self-pay | Admitting: Pediatrics

## 2015-09-18 NOTE — Telephone Encounter (Signed)
Hope you don't mind, but thought you could handle this better than me.WS

## 2015-09-18 NOTE — Telephone Encounter (Signed)
Pt started Thursday evening headache started yesterday, and low back pain & left leg pain has history of back pain in this area but has had no new injury to this area. Took med this am, about half hour later back hurt but no headache today.

## 2015-09-19 ENCOUNTER — Other Ambulatory Visit (INDEPENDENT_AMBULATORY_CARE_PROVIDER_SITE_OTHER): Payer: Self-pay | Admitting: *Deleted

## 2015-09-19 DIAGNOSIS — Z1211 Encounter for screening for malignant neoplasm of colon: Secondary | ICD-10-CM | POA: Insufficient documentation

## 2015-09-19 DIAGNOSIS — Z800 Family history of malignant neoplasm of digestive organs: Secondary | ICD-10-CM | POA: Insufficient documentation

## 2015-09-19 NOTE — Telephone Encounter (Signed)
Spoke with pt. Symptoms have resolved, pt thinks due to dehydration. SBPs in 120s, off of clonidine, on lisinopril 20mg  once a day now.

## 2015-09-20 ENCOUNTER — Ambulatory Visit (INDEPENDENT_AMBULATORY_CARE_PROVIDER_SITE_OTHER): Payer: Medicaid Other | Admitting: *Deleted

## 2015-09-20 DIAGNOSIS — E538 Deficiency of other specified B group vitamins: Secondary | ICD-10-CM | POA: Diagnosis not present

## 2015-09-20 NOTE — Progress Notes (Signed)
Pt given B12 injection IM right deltoid and tolerated well. 

## 2015-10-29 ENCOUNTER — Encounter: Payer: Self-pay | Admitting: Pediatrics

## 2015-10-29 ENCOUNTER — Ambulatory Visit (INDEPENDENT_AMBULATORY_CARE_PROVIDER_SITE_OTHER): Payer: Medicaid Other | Admitting: Pediatrics

## 2015-10-29 VITALS — BP 136/89 | HR 64 | Temp 97.3°F | Ht 70.0 in | Wt 226.2 lb

## 2015-10-29 DIAGNOSIS — I10 Essential (primary) hypertension: Secondary | ICD-10-CM

## 2015-10-29 DIAGNOSIS — E538 Deficiency of other specified B group vitamins: Secondary | ICD-10-CM

## 2015-10-29 DIAGNOSIS — L219 Seborrheic dermatitis, unspecified: Secondary | ICD-10-CM | POA: Diagnosis not present

## 2015-10-29 MED ORDER — LISINOPRIL 40 MG PO TABS: 40.0000 mg | ORAL_TABLET | Freq: Every day | ORAL | 3 refills | Status: DC

## 2015-10-29 MED ORDER — KETOCONAZOLE 2 % EX SHAM: 1.0000 "application " | MEDICATED_SHAMPOO | CUTANEOUS | 0 refills | Status: DC

## 2015-10-29 NOTE — Patient Instructions (Signed)
Antifungal shampoo: (ketoconazole) The shampoo should be left on for five to ten minutes before rinsing off. The medicated shampoo can be used daily or at least two or three times per week for several weeks, until remission is achieved. Subsequently, the use of the medicated shampoo once a week may be helpful to prevent relapse.  Let me know if not improving

## 2015-10-29 NOTE — Progress Notes (Signed)
  Subjective:   Patient ID: Earl [name], male    DOB: 1960-11-11, 55 y.o.   MRN: [id number] CC: Follow-up HTN HPI: Earl [name] is a 55 y.o. male presenting for Follow-up  HTN: taking lisinopril twice a day BPs at home 110s-120s No side effects from medications Off of clonidine Dry mouth resolved Had some cough over hte past few weeks but has also resolved, he did have some other URI symptoms Has flaky skin over ears in hair line, some in side burns as well  Relevant past medical, surgical, family and social history reviewed. Allergies and medications reviewed and updated. History  Smoking Status  . Former Smoker  . Quit date: 03/03/1979  Smokeless Tobacco  . Never Used   ROS: Per HPI   Objective:    BP 136/89   Pulse 64   Temp 97.3 F (36.3 C) (Oral)   Ht 5' 10" (1.778 m)   Wt 226 lb 3.2 oz (102.6 kg)   BMI 32.46 kg/m   Wt Readings from Last 3 Encounters:  10/29/15 226 lb 3.2 oz (102.6 kg)  09/14/15 225 lb 9.6 oz (102.3 kg)  08/30/15 224 lb 3.2 oz (101.7 kg)    Gen: NAD, alert, cooperative with exam, NCAT EYES: EOMI, no conjunctival injection, or no icterus ENT:  TMs pearly gray b/l, OP without erythema LYMPH: no cervical LAD CV: NRRR, normal S1/S2, no murmur, distal pulses 2+ b/l Resp: CTABL, no wheezes, normal WOB Ext: No edema, warm Neuro: Alert and oriented, strength equal b/l UE and LE, coordination grossly normal MSK: normal muscle bulk Skin: flaking skin over ears b/l, also in hair, esp just in hairline  Assessment & Plan:  Cornie was seen today for follow-up multiple med prolems.  Diagnoses and all orders for this visit:  Seborrheic dermatitis -     ketoconazole (NIZORAL) 2 % shampoo; Apply 1 application topically 2 (two) times a week.  Essential hypertension Improved control, took 20 mg yesterday Cont 23m lisinopril daily Labs today -     lisinopril (PRINIVIL,ZESTRIL) 40 MG tablet; Take 1 tablet (40 mg total) by mouth daily. -      BMP8+EGFR  Vitamin B12 deficiency Due for injection today  Follow up plan: Return in about 6 months (around 04/28/2016). CAssunta Found MD [place]

## 2015-10-30 LAB — BMP8+EGFR
BUN/Creatinine Ratio: 11 (ref 9–20)
BUN: 9 mg/dL (ref 6–24)
CALCIUM: 8.7 mg/dL (ref 8.7–10.2)
CO2: 25 mmol/L (ref 18–29)
CREATININE: 0.84 mg/dL (ref 0.76–1.27)
Chloride: 101 mmol/L (ref 96–106)
GFR calc Af Amer: 113 mL/min/{1.73_m2} (ref >59)
GFR, EST NON AFRICAN AMERICAN: 98 mL/min/{1.73_m2} (ref >59)
GLUCOSE: 82 mg/dL (ref 65–99)
Potassium: 4.1 mmol/L (ref 3.5–5.2)
SODIUM: 139 mmol/L (ref 134–144)

## 2015-11-29 ENCOUNTER — Encounter (INDEPENDENT_AMBULATORY_CARE_PROVIDER_SITE_OTHER): Payer: Self-pay | Admitting: *Deleted

## 2015-11-29 ENCOUNTER — Telehealth (INDEPENDENT_AMBULATORY_CARE_PROVIDER_SITE_OTHER): Payer: Self-pay | Admitting: *Deleted

## 2015-11-29 NOTE — Telephone Encounter (Signed)
Patient needs trilyte 

## 2015-11-30 ENCOUNTER — Ambulatory Visit (INDEPENDENT_AMBULATORY_CARE_PROVIDER_SITE_OTHER): Payer: Medicaid Other | Admitting: Pediatrics

## 2015-11-30 ENCOUNTER — Encounter: Payer: Self-pay | Admitting: Pediatrics

## 2015-11-30 VITALS — BP 117/82 | HR 71 | Temp 98.3°F | Ht 70.0 in | Wt 231.2 lb

## 2015-11-30 DIAGNOSIS — E538 Deficiency of other specified B group vitamins: Secondary | ICD-10-CM

## 2015-11-30 DIAGNOSIS — K921 Melena: Secondary | ICD-10-CM | POA: Insufficient documentation

## 2015-11-30 DIAGNOSIS — R319 Hematuria, unspecified: Secondary | ICD-10-CM | POA: Insufficient documentation

## 2015-11-30 DIAGNOSIS — I10 Essential (primary) hypertension: Secondary | ICD-10-CM

## 2015-11-30 DIAGNOSIS — Z7289 Other problems related to lifestyle: Secondary | ICD-10-CM | POA: Insufficient documentation

## 2015-11-30 DIAGNOSIS — Z789 Other specified health status: Secondary | ICD-10-CM | POA: Diagnosis not present

## 2015-11-30 LAB — URINALYSIS, COMPLETE
BILIRUBIN UA: NEGATIVE
GLUCOSE, UA: NEGATIVE
KETONES UA: NEGATIVE
Leukocytes, UA: NEGATIVE
NITRITE UA: NEGATIVE
Protein, UA: NEGATIVE
RBC UA: NEGATIVE
SPEC GRAV UA: 1.015 (ref 1.005–1.030)
UUROB: 0.2 mg/dL (ref 0.2–1.0)
pH, UA: 5.5 (ref 5.0–7.5)

## 2015-11-30 LAB — MICROSCOPIC EXAMINATION
Bacteria, UA: NONE SEEN
EPITHELIAL CELLS (NON RENAL): NONE SEEN /HPF (ref 0–10)
RBC MICROSCOPIC, UA: NONE SEEN /HPF (ref 0–?)

## 2015-11-30 NOTE — Progress Notes (Signed)
  Subjective:   Patient ID: Earl [name], male    DOB: September 25, 1960, 55 y.o.   MRN: [id number] CC: Follow-up (hypertension)  HPI: Earl [name] is a 55 y.o. male presenting for Follow-up (hypertension)  Has had blood in stool bc of HCTZ and a certain kind of beer he says Has colonoscopy scheduled next few weeks Also had episode of BRB in stools after episode of heavy drinking last week No further bleeding  HTN: Started on lisinopril Doing fine on medication Lowest at home 105/65  EtOH: Drinks most days, sometimes 1/2 bottle, sometimes whole bottle Never had withdrawal from EtOH  Followed by neurology for focal neuro symtpoms, some L sided smile droop  Relevant past medical, surgical, family and social history reviewed. Allergies and medications reviewed and updated. History  Smoking Status  . Former Smoker  . Quit date: 03/03/1979  Smokeless Tobacco  . Never Used   ROS: Per HPI   Objective:    BP 117/82   Pulse 71   Temp 98.3 F (36.8 C) (Oral)   Ht 5\' 10"  (1.778 m)   Wt 231 lb 3.2 oz (104.9 kg)   BMI 33.17 kg/m   Wt Readings from Last 3 Encounters:  11/30/15 231 lb 3.2 oz (104.9 kg)  10/29/15 226 lb 3.2 oz (102.6 kg)  09/14/15 225 lb 9.6 oz (102.3 kg)    Gen: NAD, alert, cooperative with exam, NCAT EYES: EOMI, no conjunctival injection, or no icterus LYMPH: no cervical LAD CV: NRRR, normal S1/S2, no murmur, distal pulses 2+ b/l Resp: CTABL, no wheezes, normal WOB Abd: +BS, soft, NTND. no guarding or organomegaly Ext: No edema, warm Neuro: Alert and oriented MSK: normal muscle bulk  Assessment & Plan:  [name] was seen today for follow-up multiple med problems.  Diagnoses and all orders for this visit:  Hematuria, unspecified type Occurred while on HCTZ one year ago Stopped when he stopped the medication None since None before -     Urinalysis, Complete  Alcohol use Discussed decreasing amount of drinking No h/o withdrawal  Hematochezia None  now Has colonoscopy scheduled in future Discussed decreasing alcohol use  Essential hypertension Well controlled  Cont medication Let me know if starts to decrease or getting lightheaded at home  Follow up plan: 5 mo as scheduled [name], MD [place]

## 2015-12-03 MED ORDER — PEG 3350-KCL-NA BICARB-NACL 420 G PO SOLR: 4000.0000 mL | Freq: Once | ORAL | 0 refills | Status: AC

## 2015-12-06 ENCOUNTER — Telehealth (INDEPENDENT_AMBULATORY_CARE_PROVIDER_SITE_OTHER): Payer: Self-pay | Admitting: *Deleted

## 2015-12-06 NOTE — Telephone Encounter (Signed)
Agree 

## 2015-12-06 NOTE — Telephone Encounter (Signed)
Referring MD/PCP: [facility]   Procedure: tcs  Reason/Indication:  Screening, fam hx colon ca  Has patient had this procedure before?  yno  If so, when, by whom and where?    Is there a family history of colon cancer?  Yes, great aunt  Who?  What age when diagnosed?    Is patient diabetic?   no      Does patient have prosthetic heart valve or mechanical valve?  no  Do you have a pacemaker?  no  Has patient ever had endocarditis? no  Has patient had joint replacement within last 12 months?  no  Does patient tend to be constipated or take laxatives? yes  Does patient have a history of alcohol/drug use?  no  Is patient on Coumadin, Plavix and/or Aspirin? no  Medications: xanax 1 mg bid, vit d3 50,000 iu daily, gabapentin 600 mg 1/2 tab tid, zinc 50 mg 1 tab weekly, potassium daily      NO NEED FOR PROPOFOL PER DR REHMAN Allergies: see epic  Medication Adjustment:       Procedure date & time: 01/02/16 at 1200

## 2015-12-31 ENCOUNTER — Ambulatory Visit (INDEPENDENT_AMBULATORY_CARE_PROVIDER_SITE_OTHER): Payer: Medicaid Other | Admitting: Pediatrics

## 2015-12-31 ENCOUNTER — Encounter: Payer: Self-pay | Admitting: Pediatrics

## 2015-12-31 VITALS — BP 111/79 | HR 58 | Temp 99.2°F | Ht 70.0 in | Wt 237.0 lb

## 2015-12-31 DIAGNOSIS — I10 Essential (primary) hypertension: Secondary | ICD-10-CM | POA: Diagnosis not present

## 2015-12-31 DIAGNOSIS — Z789 Other specified health status: Secondary | ICD-10-CM

## 2015-12-31 DIAGNOSIS — Z800 Family history of malignant neoplasm of digestive organs: Secondary | ICD-10-CM | POA: Diagnosis not present

## 2015-12-31 DIAGNOSIS — Z7289 Other problems related to lifestyle: Secondary | ICD-10-CM

## 2015-12-31 DIAGNOSIS — K625 Hemorrhage of anus and rectum: Secondary | ICD-10-CM

## 2015-12-31 DIAGNOSIS — E538 Deficiency of other specified B group vitamins: Secondary | ICD-10-CM

## 2015-12-31 NOTE — Progress Notes (Signed)
  Subjective:   Patient ID: Earl [name], male    DOB: 1960/08/01, 55 y.o.   MRN: [id number] CC: Follow-up (1 month) multiple med problems  HPI: Earl [name] is a 55 y.o. male presenting for Follow-up (1 month)  Has colonoscopy later this week to eval bleeding  Quit drinking completely a month ago Last stool yesterday, no blood then About a week ago went twice in one day, had some red stools then, says he isnt sure if it was food or stool Has had some constipation since he quit drinking Thinks the red wine was keeping him regular  HTN: taking lisinopril daily  One month ago had episode of BP in 90s, felt lightheaded No symptoms since then BP at home has been aroudn the same  Relevant past medical, surgical, family and social history reviewed. Allergies and medications reviewed and updated. History  Smoking Status  . Former Smoker  . Quit date: 03/03/1979  Smokeless Tobacco  . Never Used   ROS: Per HPI   Objective:    BP 111/79   Pulse (!) 58   Temp 99.2 F (37.3 C) (Oral)   Ht 5\' 10"  (1.778 m)   Wt 237 lb (107.5 kg)   BMI 34.01 kg/m   Wt Readings from Last 3 Encounters:  12/31/15 237 lb (107.5 kg)  11/30/15 231 lb 3.2 oz (104.9 kg)  10/29/15 226 lb 3.2 oz (102.6 kg)    Gen: NAD, alert, cooperative with exam, NCAT EYES: EOMI, no conjunctival injection, or no icterus CV: NRRR, normal S1/S2, no murmur Resp: CTABL, no wheezes, normal WOB Abd: +BS, soft, NTND. no guarding or organomegaly Ext: No edema, warm Neuro: Alert and oriented MSK: normal muscle bulk  Assessment & Plan:  Nitin was seen today for follow-up.  Diagnoses and all orders for this visit:  Essential hypertension Well controlled One episode of lightheadedness a month ago, none since then Cont current med regimen  Rectal bleeding Continues to regularly have blood in stool, not every time Has colonoscopy scheduled   Vitamin B12 deficiency Cont B12 injections  Family history of colon  cancer Has colonoscopy scheduled  Alcohol use Stopped drinking x 1 mo Plans to continue abstinence  Follow up plan: Return in about 5 months (around 05/30/2016). [name], MD [place]

## 2016-01-01 ENCOUNTER — Telehealth (INDEPENDENT_AMBULATORY_CARE_PROVIDER_SITE_OTHER): Payer: Self-pay | Admitting: *Deleted

## 2016-01-01 NOTE — Telephone Encounter (Signed)
Patient is having medical issues, swollen glands in face, twitching & weakness on one side of mouth which causes spit to run out, seeing neurologist, trying to get a diagnosis of what is causing this -- per Dr Laural Golden, patient needs to hold off on doing TCS until these issues are resolved. Patient is aware

## 2016-01-02 ENCOUNTER — Encounter (HOSPITAL_COMMUNITY): Admission: RE | Payer: Self-pay | Source: Ambulatory Visit

## 2016-01-02 ENCOUNTER — Ambulatory Visit (HOSPITAL_COMMUNITY): Admission: RE | Admit: 2016-01-02 | Payer: Medicaid Other | Source: Ambulatory Visit | Admitting: Internal Medicine

## 2016-01-02 SURGERY — COLONOSCOPY
Anesthesia: Moderate Sedation

## 2016-02-04 ENCOUNTER — Encounter (INDEPENDENT_AMBULATORY_CARE_PROVIDER_SITE_OTHER): Payer: Self-pay | Admitting: *Deleted

## 2016-02-04 ENCOUNTER — Other Ambulatory Visit (INDEPENDENT_AMBULATORY_CARE_PROVIDER_SITE_OTHER): Payer: Self-pay | Admitting: *Deleted

## 2016-02-04 DIAGNOSIS — Z800 Family history of malignant neoplasm of digestive organs: Secondary | ICD-10-CM | POA: Insufficient documentation

## 2016-02-04 DIAGNOSIS — Z1211 Encounter for screening for malignant neoplasm of colon: Secondary | ICD-10-CM

## 2016-03-04 ENCOUNTER — Other Ambulatory Visit: Payer: Self-pay | Admitting: Pediatrics

## 2016-03-04 DIAGNOSIS — I10 Essential (primary) hypertension: Secondary | ICD-10-CM

## 2016-03-11 ENCOUNTER — Ambulatory Visit: Payer: Medicaid Other | Admitting: Pediatrics

## 2016-03-11 ENCOUNTER — Telehealth: Payer: Self-pay | Admitting: Pediatrics

## 2016-03-21 ENCOUNTER — Telehealth (INDEPENDENT_AMBULATORY_CARE_PROVIDER_SITE_OTHER): Payer: Self-pay | Admitting: *Deleted

## 2016-03-21 NOTE — Telephone Encounter (Signed)
Referring MD/PCP: [facility]  Procedure: tcs  Reason/Indication:  Screening, fam hx colon ca  Has patient had this procedure before?  yno             If so, when, by whom and where?    Is there a family history of colon cancer?  Yes, great aunt             Who?  What age when diagnosed?    Is patient diabetic?   no                                                  Does patient have prosthetic heart valve or mechanical valve?  no  Do you have a pacemaker?  no  Has patient ever had endocarditis? no  Has patient had joint replacement within last 12 months?  no  Does patient tend to be constipated or take laxatives? yes  Does patient have a history of alcohol/drug use?  no  Is patient on Coumadin, Plavix and/or Aspirin? no  Medications: xanax 1 mg bid, vit d3 50,000 iu daily, gabapentin 600 mg 1/2 tab tid, zinc 50 mg 1 tab weekly, potassium daily  NO NEED FOR PROPOFOL PER DR [facility]  Allergies: see epic  Medication Adjustment per Dr Laural Golden:                                             Procedure date & time: 04/17/16 at 1200

## 2016-03-24 NOTE — Telephone Encounter (Signed)
agree

## 2016-04-16 NOTE — OR Nursing (Signed)
Called patient for pre-op phone call. Patient stated he had an episode last week where his heart rate got up to 175. Patient stated that he has had palpitations for years. He takes Lisinopril for hypertension. He has not seen a cardiologist in several years. Dr. Laural Golden notified and said ok to proceed with colonoscopy tomorrow.

## 2016-04-17 ENCOUNTER — Encounter (HOSPITAL_COMMUNITY): Payer: Self-pay | Admitting: *Deleted

## 2016-04-17 ENCOUNTER — Encounter (HOSPITAL_COMMUNITY)
Admission: RE | Disposition: A | Discharge status: Home / Self Care | Payer: Self-pay | Source: Ambulatory Visit | Attending: Internal Medicine

## 2016-04-17 ENCOUNTER — Ambulatory Visit (HOSPITAL_COMMUNITY)
Admission: RE | Admit: 2016-04-17 | Discharge: 2016-04-17 | Disposition: A | Discharge status: Home / Self Care | Payer: Medicaid Other | Source: Ambulatory Visit | Attending: Internal Medicine | Admitting: Internal Medicine

## 2016-04-17 DIAGNOSIS — Z886 Allergy status to analgesic agent status: Secondary | ICD-10-CM | POA: Insufficient documentation

## 2016-04-17 DIAGNOSIS — D125 Benign neoplasm of sigmoid colon: Secondary | ICD-10-CM | POA: Insufficient documentation

## 2016-04-17 DIAGNOSIS — Z880 Allergy status to penicillin: Secondary | ICD-10-CM | POA: Diagnosis not present

## 2016-04-17 DIAGNOSIS — Z79899 Other long term (current) drug therapy: Secondary | ICD-10-CM | POA: Insufficient documentation

## 2016-04-17 DIAGNOSIS — F329 Major depressive disorder, single episode, unspecified: Secondary | ICD-10-CM | POA: Insufficient documentation

## 2016-04-17 DIAGNOSIS — Z1211 Encounter for screening for malignant neoplasm of colon: Secondary | ICD-10-CM

## 2016-04-17 DIAGNOSIS — Z800 Family history of malignant neoplasm of digestive organs: Secondary | ICD-10-CM | POA: Insufficient documentation

## 2016-04-17 DIAGNOSIS — K648 Other hemorrhoids: Secondary | ICD-10-CM | POA: Diagnosis not present

## 2016-04-17 DIAGNOSIS — I10 Essential (primary) hypertension: Secondary | ICD-10-CM | POA: Diagnosis not present

## 2016-04-17 DIAGNOSIS — Z87891 Personal history of nicotine dependence: Secondary | ICD-10-CM | POA: Insufficient documentation

## 2016-04-17 DIAGNOSIS — C187 Malignant neoplasm of sigmoid colon: Secondary | ICD-10-CM | POA: Diagnosis not present

## 2016-04-17 HISTORY — PX: COLONOSCOPY: SHX5424

## 2016-04-17 HISTORY — PX: POLYPECTOMY: SHX5525

## 2016-04-17 SURGERY — COLONOSCOPY
Anesthesia: Moderate Sedation

## 2016-04-17 MED ORDER — MEPERIDINE HCL 50 MG/ML IJ SOLN
INTRAMUSCULAR | Status: AC
  Filled 2016-04-17: qty 1

## 2016-04-17 MED ORDER — SODIUM CHLORIDE 0.9 % IV SOLN
INTRAVENOUS | Status: DC
Start: 2016-04-17 — End: 2016-04-17
  Administered 2016-04-17: 10:00:00 via INTRAVENOUS

## 2016-04-17 MED ORDER — PROMETHAZINE HCL 25 MG/ML IJ SOLN
INTRAMUSCULAR | Status: DC | PRN
  Administered 2016-04-17: 12.5 mg via INTRAVENOUS

## 2016-04-17 MED ORDER — MIDAZOLAM HCL 5 MG/5ML IJ SOLN
INTRAMUSCULAR | Status: AC
  Filled 2016-04-17: qty 10

## 2016-04-17 MED ORDER — MIDAZOLAM HCL 5 MG/5ML IJ SOLN
INTRAMUSCULAR | Status: AC
  Filled 2016-04-17: qty 5

## 2016-04-17 MED ORDER — PROMETHAZINE HCL 25 MG/ML IJ SOLN
INTRAMUSCULAR | Status: AC
  Filled 2016-04-17: qty 1

## 2016-04-17 MED ORDER — DOCUSATE SODIUM 100 MG PO CAPS: 200.0000 mg | ORAL_CAPSULE | Freq: Every day | ORAL | 0 refills | Status: DC

## 2016-04-17 MED ORDER — MIDAZOLAM HCL 5 MG/5ML IJ SOLN
INTRAMUSCULAR | Status: DC | PRN
  Administered 2016-04-17: 2 mg via INTRAVENOUS
  Administered 2016-04-17 (×2): 3 mg via INTRAVENOUS
  Administered 2016-04-17: 2 mg via INTRAVENOUS
  Administered 2016-04-17: 3 mg via INTRAVENOUS
  Administered 2016-04-17: 2 mg via INTRAVENOUS

## 2016-04-17 MED ORDER — PSYLLIUM 28 % PO PACK: 1.0000 | PACK | Freq: Every day | ORAL | Status: DC

## 2016-04-17 MED ORDER — STERILE WATER FOR IRRIGATION IR SOLN
Status: DC | PRN
  Administered 2016-04-17: 2.5 mL

## 2016-04-17 MED ORDER — MEPERIDINE HCL 50 MG/ML IJ SOLN
INTRAMUSCULAR | Status: DC | PRN
  Administered 2016-04-17 (×4): 25 mg via INTRAVENOUS

## 2016-04-17 NOTE — H&P (Signed)
Earl [name] is an 56 y.o. male.   Chief Complaint: Patient is here for colonoscopy. HPI: Patient is 56 year old Caucasian male was here for screening colonoscopy. He says he has had lifelong constipation. He takes laxatives every now and then. He denies abdominal pain change in bowel habits or rectal bleeding. Family history is positive for CRC in great  Aunt.  Past Medical History:  Diagnosis Date  . Depression   . Hypertension   . Irregular heart beat   . MS (congenital mitral stenosis)     Past Surgical History:  Procedure Laterality Date  . MINOR CARPAL TUNNEL     right    History reviewed. No pertinent family history. Social History:  reports that he quit smoking about 37 years ago. He has never used smokeless tobacco. He reports that he drinks about 4.8 oz of alcohol per week . He reports that he does not use drugs.  Allergies:  Allergies  Allergen Reactions  . Amitriptyline Anaphylaxis  . Amoxicillin Anaphylaxis  . Asa [Aspirin] Anaphylaxis  . Clonidine Derivatives Other (See Comments)    Dry Mouth  . Hydrochlorothiazide     Caused rectal bleeding and hematuria    Facility-Administered Medications Prior to Admission  Medication Dose Route Frequency Provider Last Rate Last Dose  . cyanocobalamin ((VITAMIN B-12)) injection 1,000 mcg  1,000 mcg Intramuscular Q30 days [name], MD   1,000 mcg at 12/31/15 1529   Medications Prior to Admission  Medication Sig Dispense Refill  . ALPRAZolam (XANAX) 1 MG tablet Take 1 mg by mouth 2 (two) times daily as needed for anxiety.     . calcium carbonate (TUMS - DOSED IN MG ELEMENTAL CALCIUM) 500 MG chewable tablet Chew 3 tablets by mouth as needed for indigestion or heartburn.    . DULoxetine (CYMBALTA) 30 MG capsule Take 30 mg by mouth daily.    Marland Kitchen gabapentin (NEURONTIN) 300 MG capsule Take 300-600 mg by mouth 3 (three) times daily as needed (foot pain).     Marland Kitchen ketoconazole (NIZORAL) 2 % shampoo Apply 1 application  topically 2 (two) times a week. 120 mL 0  . lisinopril (PRINIVIL,ZESTRIL) 40 MG tablet TAKE ONE TABLET BY MOUTH ONCE DAILY 30 tablet 3  . pregabalin (LYRICA) 100 MG capsule Take 100 mg by mouth 2 (two) times daily.    . ranitidine (ZANTAC) 150 MG tablet Take 150 mg by mouth as needed for heartburn.    . Vitamin D, Ergocalciferol, (DRISDOL) 50000 units CAPS capsule Take 50,000 Units by mouth every Friday.     . zinc gluconate 50 MG tablet Take 50 mg by mouth every Friday.     . Cyanocobalamin (B-12 COMPLIANCE INJECTION) 1000 MCG/ML KIT Inject 1,000 mcg as directed. Every 2 to 3 months      No results found for this or any previous visit (from the past 48 hour(s)). No results found.  ROS  Blood pressure 123/89, pulse 80, temperature 97.6 F (36.4 C), temperature source Oral, resp. rate 17, height 5' 10"  (1.778 m), weight 215 lb (97.5 kg), SpO2 98 %. Physical Exam  Constitutional: He appears well-developed and well-nourished.  HENT:  Mouth/Throat: Oropharynx is clear and moist.  Eyes: Conjunctivae are normal. No scleral icterus.  Neck: No thyromegaly present.  Cardiovascular: Normal rate, regular rhythm and normal heart sounds.   No murmur heard. Respiratory: Effort normal and breath sounds normal.  GI:  Abdomen is full but soft and nontender without organomegaly or masses.  Musculoskeletal: He exhibits  no edema.  Lymphadenopathy:    He has no cervical adenopathy.  Neurological: He is alert.  Skin: Skin is warm and dry.     Assessment/Plan Average risk screening colonoscopy.  Hildred Laser, MD 04/17/2016, 10:45 AM

## 2016-04-17 NOTE — Discharge Instructions (Signed)
No aspirin or NSAIDs for 1 week. Resume usual medications and high fiber diet. Colace 200mg  by mouth daily at bedtime. Metamucil 3-4 g by mouth daily at bedtime. Physician will call with biopsy results.      Colonoscopy, Adult, Care After This sheet gives you information about how to care for yourself after your procedure. Your doctor may also give you more specific instructions. If you have problems or questions, call your doctor. Follow these instructions at home: General instructions    For the first 24 hours after the procedure:  Do not drive or use machinery.  Do not sign important documents.  Do not drink alcohol.  Do your daily activities more slowly than normal.  Eat foods that are soft and easy to digest.  Rest often.  Take over-the-counter or prescription medicines only as told by your doctor.  It is up to you to get the results of your procedure. Ask your doctor, or the department performing the procedure, when your results will be ready. To help cramping and bloating:   Try walking around.  Put heat on your belly (abdomen) as told by your doctor. Use a heat source that your doctor recommends, such as a moist heat pack or a heating pad.  Put a towel between your skin and the heat source.  Leave the heat on for 20-30 minutes.  Remove the heat if your skin turns bright red. This is especially important if you cannot feel pain, heat, or cold. You can get burned. Eating and drinking   Drink enough fluid to keep your pee (urine) clear or pale yellow.  Return to your normal diet as told by your doctor. Avoid heavy or fried foods that are hard to digest.  Avoid drinking alcohol for as long as told by your doctor. Contact a doctor if:  You have blood in your poop (stool) 2-3 days after the procedure. Get help right away if:  You have more than a small amount of blood in your poop.  You see large clumps of tissue (blood clots) in your poop.  Your belly is  swollen.  You feel sick to your stomach (nauseous).  You throw up (vomit).  You have a fever.  You have belly pain that gets worse, and medicine does not help your pain.    Colon Polyps Polyps are tissue growths inside the body. Polyps can grow in many places, including the large intestine (colon). A polyp may be a round bump or a mushroom-shaped growth. You could have one polyp or several. Most colon polyps are noncancerous (benign). However, some colon polyps can become cancerous over time. What are the causes? The exact cause of colon polyps is not known. What increases the risk? This condition is more likely to develop in people who:  Have a family history of colon cancer or colon polyps.  Are older than 30 or older than 45 if they are African American.  Have inflammatory bowel disease, such as ulcerative colitis or Crohn disease.  Are overweight.  Smoke cigarettes.  Do not get enough exercise.  Drink too much alcohol.  Eat a diet that is:  High in fat and red meat.  Low in fiber.  Had childhood cancer that was treated with abdominal radiation. What are the signs or symptoms? Most polyps do not cause symptoms. If you have symptoms, they may include:  Blood coming from your rectum when having a bowel movement.  Blood in your stool.The stool may look dark red or  black.  A change in bowel habits, such as constipation or diarrhea. How is this diagnosed? This condition is diagnosed with a colonoscopy. This is a procedure that uses a lighted, flexible scope to look at the inside of your colon. How is this treated? Treatment for this condition involves removing any polyps that are found. Those polyps will then be tested for cancer. If cancer is found, your health care provider will talk to you about options for colon cancer treatment. Follow these instructions at home: Diet   Eat plenty of fiber, such as fruits, vegetables, and whole grains.  Eat foods that are  high in calcium and vitamin D, such as milk, cheese, yogurt, eggs, liver, fish, and broccoli.  Limit foods high in fat, red meats, and processed meats, such as hot dogs, sausage, bacon, and lunch meats.  Maintain a healthy weight, or lose weight if recommended by your health care provider. General instructions   Do not smoke cigarettes.  Do not drink alcohol excessively.  Keep all follow-up visits as told by your health care provider. This is important. This includes keeping regularly scheduled colonoscopies. Talk to your health care provider about when you need a colonoscopy.  Exercise every day or as told by your health care provider. Contact a health care provider if:  You have new or worsening bleeding during a bowel movement.  You have new or increased blood in your stool.  You have a change in bowel habits.  You unexpectedly lose weight. This information is not intended to replace advice given to you by your health care provider. Make sure you discuss any questions you have with your health care provider. Document Released: 10/10/2003 Document Revised: 06/21/2015 Document Reviewed: 12/04/2014 Elsevier Interactive Patient Education  2017 [employer].

## 2016-04-17 NOTE — Op Note (Signed)
[facility] Patient Name: Earl [name] Procedure Date: 04/17/2016 10:16 AM MRN: [id number] Date of Birth: 10/17/1960 Attending MD: Hildred Laser , MD CSN: [id number] Age: 56 Admit Type: Outpatient Procedure:                Colonoscopy Indications:              Screening for colorectal malignant neoplasm Providers:                Hildred Laser, MD, Janeece Riggers, RN, Lurline Del, RN Referring MD:             [name], MD Medicines:                Promethazine 12.5 mg IV, Meperidine 100 mg IV,                            Midazolam 15 mg IV Complications:            No immediate complications. Estimated Blood Loss:     Estimated blood loss: none. Procedure:                Pre-Anesthesia Assessment:                           - Prior to the procedure, a History and Physical                            was performed, and patient medications and                            allergies were reviewed. [name] tolerance of                            previous anesthesia was also reviewed. The risks                            and benefits of the procedure and the sedation                            options and risks were discussed with the patient.                            All questions were answered, and informed consent                            was obtained. Prior Anticoagulants: The patient has                            taken no previous anticoagulant or antiplatelet                            agents. ASA Grade Assessment: II - A patient with                            mild systemic disease. After reviewing the risks  and benefits, the patient was deemed in                            satisfactory condition to undergo the procedure.                           After obtaining informed consent, the colonoscope                            was passed under direct vision. Throughout the                            procedure, the patient's blood pressure, pulse,  and                            oxygen saturations were monitored continuously. The                            EC-[id number] ([id number]) scope was introduced through                            the anus and advanced to the the cecum, identified                            by appendiceal orifice and ileocecal valve. The                            colonoscopy was performed without difficulty. The                            patient tolerated the procedure well. The quality                            of the bowel preparation was adequate. The                            ileocecal valve, appendiceal orifice, and rectum                            were photographed. Scope In: 11:03:59 AM Scope Out: 11:20:12 AM Total Procedure Duration: 0 hours 16 minutes 13 seconds  Findings:      The perianal and digital rectal examinations were normal.      A 10 mm polyp was found in the distal sigmoid colon. The polyp was       semi-pedunculated. The polyp was removed with a hot snare. Resection and       retrieval were complete.      The exam was otherwise normal throughout the examined colon.      Internal hemorrhoids were found during retroflexion. The hemorrhoids       were medium-sized. Impression:               - One 10 mm polyp in the distal sigmoid colon,  removed with a hot snare. Resected and retrieved.                           - Internal hemorrhoids. Moderate Sedation:      Moderate (conscious) sedation was administered by the endoscopy nurse       and supervised by the endoscopist. The following parameters were       monitored: oxygen saturation, heart rate, blood pressure, CO2       capnography and response to care. Total physician intraservice time was       30 minutes. Recommendation:           - Patient has a contact number available for                            emergencies. The signs and symptoms of potential                            delayed complications were  discussed with the                            patient. Return to normal activities tomorrow.                            Written discharge instructions were provided to the                            patient.                           - Resume previous diet.                           - High fiber diet today.                           - Continue present medications.                           - Await pathology results.                           - No aspirin, ibuprofen, naproxen, or other                            non-steroidal anti-inflammatory drugs for 7 days                            after polyp removal.                           - Repeat colonoscopy in 5 years for surveillance. Procedure Code(s):        --- Professional ---                           [phone], Colonoscopy, flexible; with removal of  tumor(s), polyp(s), or other lesion(s) by snare                            technique                           99152, Moderate sedation services provided by the                            same physician or other qualified health care                            professional performing the diagnostic or                            therapeutic service that the sedation supports,                            requiring the presence of an independent trained                            observer to assist in the monitoring of the                            patient's level of consciousness and physiological                            status; initial 15 minutes of intraservice time,                            patient age 11 years or older                           [phone], Moderate sedation services; each additional                            15 minutes intraservice time Diagnosis Code(s):        --- Professional ---                           Z12.11, Encounter for screening for malignant                            neoplasm of colon                           D12.5, Benign neoplasm of  sigmoid colon                           K64.8, Other hemorrhoids CPT copyright 2016 American Medical Association. All rights reserved. The codes documented in this report are preliminary and upon coder review may  be revised to meet current compliance requirements. Hildred Laser, MD Hildred Laser, MD 04/17/2016 11:30:21 AM This report has been signed electronically. Number of Addenda: 0

## 2016-04-21 ENCOUNTER — Encounter (HOSPITAL_COMMUNITY): Payer: Self-pay | Admitting: Internal Medicine

## 2016-04-28 ENCOUNTER — Encounter (INDEPENDENT_AMBULATORY_CARE_PROVIDER_SITE_OTHER): Payer: Self-pay | Admitting: *Deleted

## 2016-04-28 ENCOUNTER — Ambulatory Visit (INDEPENDENT_AMBULATORY_CARE_PROVIDER_SITE_OTHER): Payer: Medicaid Other | Admitting: Pediatrics

## 2016-04-28 ENCOUNTER — Other Ambulatory Visit (INDEPENDENT_AMBULATORY_CARE_PROVIDER_SITE_OTHER): Payer: Self-pay | Admitting: Internal Medicine

## 2016-04-28 ENCOUNTER — Encounter: Payer: Self-pay | Admitting: Pediatrics

## 2016-04-28 VITALS — BP 118/76 | HR 70 | Temp 97.7°F | Ht 70.0 in | Wt 239.2 lb

## 2016-04-28 DIAGNOSIS — F329 Major depressive disorder, single episode, unspecified: Secondary | ICD-10-CM | POA: Diagnosis not present

## 2016-04-28 DIAGNOSIS — K5900 Constipation, unspecified: Secondary | ICD-10-CM

## 2016-04-28 DIAGNOSIS — F32A Depression, unspecified: Secondary | ICD-10-CM

## 2016-04-28 DIAGNOSIS — E538 Deficiency of other specified B group vitamins: Secondary | ICD-10-CM

## 2016-04-28 DIAGNOSIS — C187 Malignant neoplasm of sigmoid colon: Secondary | ICD-10-CM

## 2016-04-28 DIAGNOSIS — I10 Essential (primary) hypertension: Secondary | ICD-10-CM | POA: Diagnosis not present

## 2016-04-28 DIAGNOSIS — R002 Palpitations: Secondary | ICD-10-CM | POA: Diagnosis not present

## 2016-04-28 MED ORDER — LINACLOTIDE 290 MCG PO CAPS: 290.0000 ug | ORAL_CAPSULE | Freq: Every day | ORAL | 1 refills | Status: DC

## 2016-04-28 MED ORDER — DULOXETINE HCL 30 MG PO CPEP: 60.0000 mg | ORAL_CAPSULE | Freq: Every day | ORAL | 3 refills | Status: DC

## 2016-04-28 MED ORDER — CYANOCOBALAMIN 1000 MCG/ML IJ SOLN
1000.0000 ug | Freq: Once | INTRAMUSCULAR | Status: AC
  Administered 2016-04-28: 1000 ug via INTRAMUSCULAR

## 2016-04-28 NOTE — Progress Notes (Signed)
  Subjective:   Patient ID: Earl [name], male    DOB: 1960-06-28, 56 y.o.   MRN: [id number] CC: Follow-up multiple med problems  HPI: [name] is a 56 y.o. male presenting for Follow-up  Gained about 40 lbs after stopping drinking EtOH Has lost 10 lbs Is constipated all the time Takes OTC stool softener regularly, not helping Going about every 6 days  Recent cancerous polyp removed Following with GI  Appetite has gone way up since stopping drinking Avoids sugary foods, eating low fat foods Eating vegetables some Says he feels hungry all the time  Says he doesn't feel like taking with people, going out since he stopped drinking Doesn't feel sad or hopeless Mood has been down though On cymbalta 30mg  now Has been on several SSRIs in the past, have not helped  Episode at home of heart palpitations, says he checked his HR and it was in the 170s Stayed like that for several minutes Has never happened before or since Had some chest pressure at the time  Relevant past medical, surgical, family and social history reviewed. Allergies and medications reviewed and updated. History  Smoking Status  . Former Smoker  . Quit date: 03/03/1979  Smokeless Tobacco  . Never Used   ROS: Per HPI   Objective:    BP 118/76   Pulse 70   Temp 97.7 F (36.5 C) (Oral)   Ht 5\' 10"  (1.778 m)   Wt 239 lb 3.2 oz (108.5 kg)   BMI 34.32 kg/m   Wt Readings from Last 3 Encounters:  04/28/16 239 lb 3.2 oz (108.5 kg)  04/17/16 215 lb (97.5 kg)  12/31/15 237 lb (107.5 kg)    Gen: NAD, alert, cooperative with exam, NCAT EYES: EOMI, no conjunctival injection, or no icterus ENT:  TMs pearly gray b/l, OP without erythema LYMPH: no cervical LAD CV: NRRR, normal S1/S2, no murmur, distal pulses 2+ b/l Resp: CTABL, no wheezes, normal WOB Abd: +BS, soft, NTND. no guarding or organomegaly Ext: No edema, warm Neuro: Alert and oriented, strength equal b/l UE and LE, coordination grossly  normal MSK: normal muscle bulk  Assessment & Plan:  [name] was seen today for follow-up multi med problems  Diagnoses and all orders for this visit:  Depression, unspecified depression type Ongoing symptoms, increase to 60mg  daiy -     DULoxetine (CYMBALTA) 30 MG capsule; Take 2 capsules (60 mg total) by mouth daily.  Constipation, unspecified constipation type OTC meds not working, trial of below -     linaclotide (LINZESS) 290 MCG CAPS capsule; Take 1 capsule (290 mcg total) by mouth daily before breakfast.  Essential hypertension Well controlle,d cont current med  Vitamin B12 deficiency B12 shot today, return to clinic q4wks for next IM shots  Heart palpitations Says HR at home was 170s per his cuff Lasted a few minutes then came down slowly Will refer to cardiology  Follow up plan: No Follow-up on file. [name], MD [place]

## 2016-04-28 NOTE — Addendum Note (Signed)
Addended by: Wardell Heath on: 04/28/2016 02:54 PM   Modules accepted: Orders

## 2016-04-29 LAB — CEA: CEA: <0.5 ng/mL

## 2016-04-30 ENCOUNTER — Ambulatory Visit (HOSPITAL_COMMUNITY)
Admission: RE | Admit: 2016-04-30 | Discharge: 2016-04-30 | Disposition: A | Discharge status: Home / Self Care | Payer: Medicaid Other | Source: Ambulatory Visit | Attending: Internal Medicine | Admitting: Internal Medicine

## 2016-04-30 DIAGNOSIS — N200 Calculus of kidney: Secondary | ICD-10-CM | POA: Diagnosis not present

## 2016-04-30 DIAGNOSIS — C187 Malignant neoplasm of sigmoid colon: Secondary | ICD-10-CM | POA: Insufficient documentation

## 2016-04-30 LAB — POCT I-STAT CREATININE: Creatinine, Ser: 1 mg/dL (ref 0.61–1.24)

## 2016-04-30 MED ORDER — IOPAMIDOL (ISOVUE-300) INJECTION 61%
100.0000 mL | Freq: Once | INTRAVENOUS | Status: AC | PRN
  Administered 2016-04-30: 100 mL via INTRAVENOUS

## 2016-05-05 ENCOUNTER — Telehealth (INDEPENDENT_AMBULATORY_CARE_PROVIDER_SITE_OTHER): Payer: Self-pay | Admitting: Internal Medicine

## 2016-05-05 ENCOUNTER — Other Ambulatory Visit (INDEPENDENT_AMBULATORY_CARE_PROVIDER_SITE_OTHER): Payer: Self-pay | Admitting: *Deleted

## 2016-05-05 DIAGNOSIS — K625 Hemorrhage of anus and rectum: Secondary | ICD-10-CM

## 2016-05-05 NOTE — Telephone Encounter (Signed)
I am forwarding this to Deberah Castle ,NP as she is the extender in the office, to address. Dr.Rehman is out off campus for a few days.

## 2016-05-05 NOTE — Telephone Encounter (Signed)
Patient called and stated that his regular doctor has prescribed Linzess for him and under the circumstances he'd like to know if this is something that he should take.  If not, he would like for [place] to prescribe something for him.  He wants to know if Linzess is the drug he should be taking right now.  He has not taken any yet.  [phone]

## 2016-05-05 NOTE — Telephone Encounter (Signed)
If this medication was prescribed for constipation, it is okay to take. Please let patient know.

## 2016-05-05 NOTE — Telephone Encounter (Signed)
Patient was called and advised that Dr.Rehman is out of town . This will be addressed with him and we would call him back with Dr.Rehman's recommendation.

## 2016-05-07 NOTE — Telephone Encounter (Signed)
Unable to reach patient vis phone. Rang numerous times ,no voice mail picked up.

## 2016-05-07 NOTE — Telephone Encounter (Signed)
Per Dr.Rehman - Patient may take this for constipation. Patient called and made aware.

## 2016-05-15 ENCOUNTER — Encounter: Payer: Self-pay | Admitting: Cardiovascular Disease

## 2016-05-15 ENCOUNTER — Ambulatory Visit (INDEPENDENT_AMBULATORY_CARE_PROVIDER_SITE_OTHER): Payer: Medicaid Other | Admitting: Cardiovascular Disease

## 2016-05-15 VITALS — BP 111/77 | HR 60 | Ht 70.0 in | Wt 236.2 lb

## 2016-05-15 DIAGNOSIS — I10 Essential (primary) hypertension: Secondary | ICD-10-CM

## 2016-05-15 DIAGNOSIS — R002 Palpitations: Secondary | ICD-10-CM

## 2016-05-15 NOTE — Patient Instructions (Signed)
Medication Instructions:  Continue all current medications.  Labwork: none  Testing/Procedures: none  Follow-Up: As needed.    Any Other Special Instructions Will Be Listed Below (If Applicable).  If you need a refill on your cardiac medications before your next appointment, please call your pharmacy.  

## 2016-05-15 NOTE — Progress Notes (Signed)
CARDIOLOGY CONSULT NOTE  Patient ID: Earl [name] MRN: [id number] DOB/AGE: January 22, 1961 56 y.o.  Admit date: (Not on file) Primary Physician: [name], MD Referring Physician: Evette Doffing  Reason for Consultation: palpitations  HPI: Earl [name] is a 56 y.o. male who is being seen today for the evaluation of palpitations at the request of [name], MD.   He has a history of hypertension, depression, and vitamin B-12 deficiency.  Labs on 08/30/15 which I reviewed showed normal renal function and sodium, potassium, and calcium levels.  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  He said he has had palpitations on and off for years. He may go several months without experiencing them and then may have them once or twice a week. They last for 5 seconds. After starting Lyrica within one to two weeks he developed rapid palpitations. He checked his heart rate and it was 175 bpm using 2 different cuffs. It gradually resolved on its own. On a separate occasion it was as high as 150 bpm. It also resolved on its own.  He did an IT consultant and found that some other patients who have used Lyrica experienced similar symptoms.  He denies exertional chest pain, shortness of breath, and sleep apnea. He denies leg swelling, orthopnea, and paroxysmal nocturnal dyspnea.  He had experienced dizziness in the past but it was due to antihypertensive medications.  He quit drinking alcohol in December 2017 and put on 40 pounds but has since lost 10 pounds.    Allergies  Allergen Reactions  . Amitriptyline Anaphylaxis  . Amoxicillin Anaphylaxis  . Asa [Aspirin] Anaphylaxis  . Clonidine Derivatives Other (See Comments)    Dry Mouth  . Hydrochlorothiazide     Caused rectal bleeding and hematuria     Current Outpatient Prescriptions  Medication Sig Dispense Refill  . ALPRAZolam (XANAX) 1 MG tablet Take 1 mg by mouth 2 (two) times daily as needed for anxiety.     . calcium carbonate (TUMS - DOSED IN MG ELEMENTAL CALCIUM) 500 MG chewable tablet Chew 3 tablets by mouth as needed for indigestion or heartburn.    . Cyanocobalamin (B-12 COMPLIANCE INJECTION) 1000 MCG/ML KIT Inject 1,000 mcg as directed. Every 2 to 3 months    . docusate sodium (COLACE) 100 MG capsule Take 2 capsules (200 mg total) by mouth daily. 1 capsule 0  . DULoxetine (CYMBALTA) 30 MG capsule Take 2 capsules (60 mg total) by mouth daily. 60 capsule 3  . gabapentin (NEURONTIN) 300 MG capsule Take 300-600 mg by mouth 3 (three) times daily as needed (foot pain).     Marland Kitchen ketoconazole (NIZORAL) 2 % shampoo Apply 1 application topically 2 (two) times a week. 120 mL 0  . linaclotide (LINZESS) 290 MCG CAPS capsule Take 1 capsule (290 mcg total) by mouth daily before breakfast. 30 capsule 1  . lisinopril (PRINIVIL,ZESTRIL) 40 MG tablet TAKE ONE TABLET BY MOUTH ONCE DAILY 30 tablet 3  . pregabalin (LYRICA) 100 MG capsule Take 100 mg by mouth 2 (two) times daily.    . psyllium (METAMUCIL SMOOTH TEXTURE) 28 % packet Take 1 packet by mouth at bedtime.    . ranitidine (ZANTAC) 150 MG tablet Take 150 mg by mouth as needed for heartburn.    . Vitamin D, Ergocalciferol, (DRISDOL) 50000 units  CAPS capsule Take 50,000 Units by mouth every Friday.     . zinc gluconate 50 MG tablet Take 50 mg by mouth every Friday.      No current facility-administered medications for this visit.     Past Medical History:  Diagnosis Date  . Colon cancer ([place])   . Depression   . Hypertension   . Irregular heart beat   . MS (congenital mitral stenosis)     Past Surgical History:  Procedure Laterality Date  . COLONOSCOPY N/A 04/17/2016   Procedure: COLONOSCOPY;  Surgeon: [name], MD;  [facility];  Service: Endoscopy;  Laterality:  N/A;  1200  . MINOR CARPAL TUNNEL     right  . POLYPECTOMY  04/17/2016   Procedure: POLYPECTOMY;  Surgeon: [name], MD;  Location: [facility];  Service: Endoscopy;;  sigmoid    Social History   Social History  . Marital status: Single    Spouse name: N/A  . Number of children: N/A  . Years of education: N/A   Occupational History  . Not on file.   Social History Main Topics  . Smoking status: Former Smoker    Quit date: 03/03/1979  . Smokeless tobacco: Never Used  . Alcohol use 4.8 oz/week    8 Cans of beer per week     Comment: drinks heavily at times  . Drug use: No  . Sexual activity: Not on file   Other Topics Concern  . Not on file   Social History Narrative  . No narrative on file     No family history of premature CAD in 1st degree relatives.  Current Meds  Medication Sig  . ALPRAZolam (XANAX) 1 MG tablet Take 1 mg by mouth 2 (two) times daily as needed for anxiety.   . calcium carbonate (TUMS - DOSED IN MG ELEMENTAL CALCIUM) 500 MG chewable tablet Chew 3 tablets by mouth as needed for indigestion or heartburn.  . Cyanocobalamin (B-12 COMPLIANCE INJECTION) 1000 MCG/ML KIT Inject 1,000 mcg as directed. Every 2 to 3 months  . docusate sodium (COLACE) 100 MG capsule Take 2 capsules (200 mg total) by mouth daily.  . DULoxetine (CYMBALTA) 30 MG capsule Take 2 capsules (60 mg total) by mouth daily.  Marland Kitchen gabapentin (NEURONTIN) 300 MG capsule Take 300-600 mg by mouth 3 (three) times daily as needed (foot pain).   Marland Kitchen ketoconazole (NIZORAL) 2 % shampoo Apply 1 application topically 2 (two) times a week.  . linaclotide (LINZESS) 290 MCG CAPS capsule Take 1 capsule (290 mcg total) by mouth daily before breakfast.  . lisinopril (PRINIVIL,ZESTRIL) 40 MG tablet TAKE ONE TABLET BY MOUTH ONCE DAILY  . pregabalin (LYRICA) 100 MG capsule Take 100 mg by mouth 2 (two) times daily.  . psyllium (METAMUCIL SMOOTH TEXTURE) 28 % packet Take 1 packet by mouth at bedtime.  .  ranitidine (ZANTAC) 150 MG tablet Take 150 mg by mouth as needed for heartburn.  . Vitamin D, Ergocalciferol, (DRISDOL) 50000 units CAPS capsule Take 50,000 Units by mouth every Friday.   . zinc gluconate 50 MG tablet Take 50 mg by mouth every Friday.       Review of systems complete and found to be negative unless listed above in HPI    Physical exam Blood pressure 111/77, pulse 60, height 5' 10"  (1.778 m), weight 236 lb 3.2 oz (107.1 kg), SpO2 96 %. General: NAD Neck: No JVD, no thyromegaly or thyroid nodule.  Lungs: Clear to  auscultation bilaterally with normal respiratory effort. CV: Nondisplaced PMI. Regular rate and rhythm, normal S1/S2, no S3/S4, no murmur.  No peripheral edema.  No carotid bruit.    Abdomen: Firm, obese. Skin: Intact without lesions or rashes.  Neurologic: Alert and oriented x 3.  Psych: Normal affect. Extremities: No clubbing or cyanosis.  HEENT: Normal.   ECG: Most recent ECG reviewed.   Labs: Lab Results  Component Value Date/Time   K 4.1 10/29/2015 05:03 PM   BUN 9 10/29/2015 05:03 PM   CREATININE 1.00 04/30/2016 12:49 PM   ALT 21 03/08/2015 11:38 AM   HGB 15.4 03/08/2015 11:38 AM     Lipids: No results found for: LDLCALC, LDLDIRECT, CHOL, TRIG, HDL      ASSESSMENT AND PLAN:  1. Palpitations: It appears there was a direct correlation between tachycardia and palpitations after the institution of Lyrica. They have since resolved since stopping this medication. His ECG is normal. I told him should he have a recurrence of rapid palpitations, I would then consider event monitoring. Physical exam is unremarkable. I do not feel cardiovascular testing is indicated at this time.  2. Hypertension: Controlled. No changes.  Disposition: Follow up prn  Signed: Kate Sable, M.D., F.A.C.C.  05/15/2016, 9:35 AM

## 2016-05-26 ENCOUNTER — Ambulatory Visit (INDEPENDENT_AMBULATORY_CARE_PROVIDER_SITE_OTHER): Payer: Medicaid Other | Admitting: *Deleted

## 2016-05-26 DIAGNOSIS — E538 Deficiency of other specified B group vitamins: Secondary | ICD-10-CM | POA: Diagnosis not present

## 2016-05-26 MED ORDER — CYANOCOBALAMIN 1000 MCG/ML IJ SOLN
1000.0000 ug | INTRAMUSCULAR | Status: DC
Start: 2016-05-26 — End: 2019-05-13
  Administered 2016-05-26 – 2018-09-22 (×12): 1000 ug via INTRAMUSCULAR

## 2016-05-26 NOTE — Progress Notes (Signed)
Pt given vit B12 inj Tolerated well 

## 2016-06-02 ENCOUNTER — Ambulatory Visit: Payer: Medicaid Other | Admitting: Pediatrics

## 2016-06-08 ENCOUNTER — Encounter (HOSPITAL_COMMUNITY): Payer: Self-pay | Admitting: *Deleted

## 2016-06-08 ENCOUNTER — Emergency Department (HOSPITAL_COMMUNITY)
Admission: EM | Admit: 2016-06-08 | Discharge: 2016-06-08 | Disposition: A | Discharge status: Home / Self Care | Payer: Medicaid Other | Attending: Emergency Medicine | Admitting: Emergency Medicine

## 2016-06-08 DIAGNOSIS — R002 Palpitations: Secondary | ICD-10-CM | POA: Diagnosis present

## 2016-06-08 DIAGNOSIS — Z85038 Personal history of other malignant neoplasm of large intestine: Secondary | ICD-10-CM | POA: Diagnosis not present

## 2016-06-08 DIAGNOSIS — Z79899 Other long term (current) drug therapy: Secondary | ICD-10-CM | POA: Insufficient documentation

## 2016-06-08 DIAGNOSIS — F1010 Alcohol abuse, uncomplicated: Secondary | ICD-10-CM | POA: Diagnosis not present

## 2016-06-08 DIAGNOSIS — I480 Paroxysmal atrial fibrillation: Secondary | ICD-10-CM | POA: Insufficient documentation

## 2016-06-08 DIAGNOSIS — Z87891 Personal history of nicotine dependence: Secondary | ICD-10-CM | POA: Insufficient documentation

## 2016-06-08 DIAGNOSIS — I10 Essential (primary) hypertension: Secondary | ICD-10-CM | POA: Insufficient documentation

## 2016-06-08 DIAGNOSIS — I4892 Unspecified atrial flutter: Secondary | ICD-10-CM

## 2016-06-08 LAB — COMPREHENSIVE METABOLIC PANEL
ALK PHOS: 69 U/L (ref 38–126)
ALT: 28 U/L (ref 17–63)
AST: 25 U/L (ref 15–41)
Albumin: 4.2 g/dL (ref 3.5–5.0)
Anion gap: 9 (ref 5–15)
BILIRUBIN TOTAL: 0.5 mg/dL (ref 0.3–1.2)
BUN: 14 mg/dL (ref 6–20)
CALCIUM: 9.8 mg/dL (ref 8.9–10.3)
CO2: 27 mmol/L (ref 22–32)
CREATININE: 0.92 mg/dL (ref 0.61–1.24)
Chloride: 100 mmol/L — ABNORMAL LOW (ref 101–111)
GFR calc non Af Amer: >60 mL/min (ref >60)
Glucose, Bld: 98 mg/dL (ref 65–99)
Potassium: 3.6 mmol/L (ref 3.5–5.1)
Sodium: 136 mmol/L (ref 135–145)
TOTAL PROTEIN: 7 g/dL (ref 6.5–8.1)

## 2016-06-08 LAB — CBC WITH DIFFERENTIAL/PLATELET
BASOS ABS: 0 10*3/uL (ref 0.0–0.1)
Basophils Relative: 0 %
Eosinophils Absolute: 0.2 10*3/uL (ref 0.0–0.7)
Eosinophils Relative: 2 %
HCT: 46.5 % (ref 39.0–52.0)
HEMOGLOBIN: 16.4 g/dL (ref 13.0–17.0)
LYMPHS ABS: 3 10*3/uL (ref 0.7–4.0)
LYMPHS PCT: 49 %
MCH: 32 pg (ref 26.0–34.0)
MCHC: 35.3 g/dL (ref 30.0–36.0)
MCV: 90.6 fL (ref 78.0–100.0)
Monocytes Absolute: 0.5 10*3/uL (ref 0.1–1.0)
Monocytes Relative: 7 %
NEUTROS ABS: 2.7 10*3/uL (ref 1.7–7.7)
NEUTROS PCT: 42 %
PLATELETS: 198 10*3/uL (ref 150–400)
RBC: 5.13 MIL/uL (ref 4.22–5.81)
RDW: 13 % (ref 11.5–15.5)
WBC: 6.4 10*3/uL (ref 4.0–10.5)

## 2016-06-08 LAB — MAGNESIUM: MAGNESIUM: 1.9 mg/dL (ref 1.7–2.4)

## 2016-06-08 LAB — TROPONIN I

## 2016-06-08 MED ORDER — DILTIAZEM HCL 100 MG IV SOLR
5.0000 mg/h | INTRAVENOUS | Status: DC
  Administered 2016-06-08: 5 mg/h via INTRAVENOUS
  Filled 2016-06-08 (×2): qty 100

## 2016-06-08 MED ORDER — DILTIAZEM HCL 30 MG PO TABS
30.0000 mg | ORAL_TABLET | Freq: Once | ORAL | Status: AC
  Administered 2016-06-08: 30 mg via ORAL
  Filled 2016-06-08: qty 1

## 2016-06-08 MED ORDER — DILTIAZEM LOAD VIA INFUSION
20.0000 mg | Freq: Once | INTRAVENOUS | Status: AC
  Administered 2016-06-08: 20 mg via INTRAVENOUS
  Filled 2016-06-08: qty 20

## 2016-06-08 MED ORDER — FLECAINIDE ACETATE 100 MG PO TABS
ORAL_TABLET | ORAL | Status: AC
  Filled 2016-06-08: qty 3

## 2016-06-08 MED ORDER — APIXABAN 5 MG PO TABS
ORAL_TABLET | ORAL | Status: AC
  Filled 2016-06-08: qty 1

## 2016-06-08 MED ORDER — APIXABAN 5 MG PO TABS
5.0000 mg | ORAL_TABLET | ORAL | Status: AC
  Administered 2016-06-08: 5 mg via ORAL
  Filled 2016-06-08: qty 1

## 2016-06-08 MED ORDER — FLECAINIDE ACETATE 100 MG PO TABS
300.0000 mg | ORAL_TABLET | ORAL | Status: AC
  Administered 2016-06-08: 300 mg via ORAL
  Filled 2016-06-08: qty 3

## 2016-06-08 MED ORDER — DILTIAZEM HCL ER COATED BEADS 120 MG PO CP24: 120.0000 mg | ORAL_CAPSULE | Freq: Every day | ORAL | 0 refills | Status: DC

## 2016-06-08 MED ORDER — APIXABAN 5 MG PO TABS: 5.0000 mg | ORAL_TABLET | Freq: Two times a day (BID) | ORAL | 0 refills | Status: DC

## 2016-06-08 NOTE — Discharge Instructions (Signed)
Do not drink alcohol - it can cause your heart to go into atrial fibrillation or atrial flutter.  Lyrica may play a role in developing atrial flutter - talk with your cardiologist about if it is safe to take.

## 2016-06-08 NOTE — ED Provider Notes (Addendum)
[facility] DEPT Provider Note   CSN: [id number] Arrival date & time: 06/08/16  0234     History   Chief Complaint Chief Complaint  Patient presents with  . Tachycardia    HPI Earl [name] is a 56 y.o. male.  The history is provided by the patient.  He had onset at 1 AM since of palpitations. He used a blood pressure machine to check his heart rate and noted it was around 150. He has had several other episodes of in the last several months. He had seen a cardiologist. There was some concern it could be related to Lyrica and he had stopped taking Lyrica. This is the first episode he has had since discontinuing Lyrica. He is a heavy drinker with up to 10 drinks a day. With this current episode of palpitations, he denies chest pain or dyspnea with or is mild nausea. There is no diaphoresis. He is a nonsmoker.  Past Medical History:  Diagnosis Date  . Colon cancer ([place])   . Depression   . Hypertension   . Irregular heart beat   . MS (congenital mitral stenosis)     Patient Active Problem List   Diagnosis Date Noted  . Family hx of colon cancer 02/04/2016  . Alcohol use 11/30/2015  . Hematuria 11/30/2015  . Hematochezia 11/30/2015  . Special screening for malignant neoplasms, colon 09/19/2015  . Family history of colon cancer 09/19/2015  . Neuropathy 08/30/2015  . Rectal bleeding 08/30/2015  . Essential hypertension 08/30/2015  . Abnormal brain MRI 08/30/2015  . Vitamin B12 deficiency 08/30/2015  . BMI 32.0-32.9,adult 08/30/2015    Past Surgical History:  Procedure Laterality Date  . COLONOSCOPY N/A 04/17/2016   Procedure: COLONOSCOPY;  Surgeon: [name], MD;  [facility];  Service: Endoscopy;  Laterality: N/A;  1200  . MINOR CARPAL TUNNEL     right  . POLYPECTOMY  04/17/2016   Procedure: POLYPECTOMY;  Surgeon: [name], MD;  [facility];  Service: Endoscopy;;  sigmoid       Home Medications    Prior to Admission  medications   Medication Sig Start Date End Date Taking? Authorizing Provider  ALPRAZolam Duanne Moron) 1 MG tablet Take 1 mg by mouth 2 (two) times daily as needed for anxiety.    Yes [provider]  calcium carbonate (TUMS - DOSED IN MG ELEMENTAL CALCIUM) 500 MG chewable tablet Chew 3 tablets by mouth as needed for indigestion or heartburn.   Yes [provider]  Cyanocobalamin (B-12 COMPLIANCE INJECTION) 1000 MCG/ML KIT Inject 1,000 mcg as directed. Every 2 to 3 months   Yes [provider]  docusate sodium (COLACE) 100 MG capsule Take 2 capsules (200 mg total) by mouth daily. 04/17/16  Yes Rehman, Mechele Dawley, MD  DULoxetine (CYMBALTA) 30 MG capsule Take 2 capsules (60 mg total) by mouth daily. 04/28/16  Yes [name], MD  gabapentin (NEURONTIN) 300 MG capsule Take 300-600 mg by mouth 3 (three) times daily as needed (foot pain).    Yes [provider]  ketoconazole (NIZORAL) 2 % shampoo Apply 1 application topically 2 (two) times a week. 10/29/15  Yes [name], MD  linaclotide [facility]) 290 MCG CAPS capsule Take 1 capsule (290 mcg total) by mouth daily before breakfast. 04/28/16  Yes [name], MD  lisinopril (PRINIVIL,ZESTRIL) 40 MG tablet TAKE ONE TABLET BY MOUTH ONCE DAILY 03/05/16  Yes [name], MD  psyllium ([place])  28 % packet Take 1 packet by mouth at bedtime. 04/17/16  Yes Rehman, Mechele Dawley, MD  ranitidine (ZANTAC) 150 MG tablet Take 150 mg by mouth as needed for heartburn.   Yes [provider]  Vitamin D, Ergocalciferol, (DRISDOL) 50000 units CAPS capsule Take 50,000 Units by mouth every Friday.    Yes [provider]  zinc gluconate 50 MG tablet Take 50 mg by mouth every Friday.    Yes [provider]  pregabalin (LYRICA) 100 MG capsule Take 100 mg by mouth 2 (two) times daily.    [provider]    Family History Family History  Problem Relation Age of Onset  . Heart disease  Father   . Coronary artery disease Father   . Coronary artery disease Paternal Grandfather     Social History Social History  Substance Use Topics  . Smoking status: Former Smoker    Quit date: 03/03/1979  . Smokeless tobacco: Never Used  . Alcohol use 4.8 oz/week    8 Cans of beer per week     Comment: drinks heavily at times     Allergies   Amitriptyline; Amoxicillin; Asa [aspirin]; Clonidine derivatives; and Hydrochlorothiazide   Review of Systems Review of Systems  All other systems reviewed and are negative.    Physical Exam Updated Vital Signs BP (!) 127/103   Pulse (!) 147   Temp 97.9 F (36.6 C) (Oral)   Resp 17   Ht _0  (1.778 m)   Wt 220 lb (99.8 kg)   SpO2 96%   BMI 31.57 kg/m   Physical Exam  Nursing note and vitals reviewed.  56 year old male, resting comfortably and in no acute distress. Vital signs are significant for hypertension and tachycardia. Oxygen saturation is 96%, which is normal. Head is normocephalic and atraumatic. PERRLA, EOMI. Oropharynx is clear. Neck is nontender and supple without adenopathy or JVD. Back is nontender and there is no CVA tenderness. Lungs are clear without rales, wheezes, or rhonchi. Chest is nontender. Heart is tachycardic without murmur. Abdomen is soft, flat, nontender without masses or hepatosplenomegaly and peristalsis is normoactive. Extremities have no cyanosis or edema, full range of motion is present. Skin is warm and dry without rash. Neurologic: Mental status is normal, cranial nerves are intact, there are no motor or sensory deficits.  ED Treatments / Results  Labs (all labs ordered are listed, but only abnormal results are displayed) Labs Reviewed  COMPREHENSIVE METABOLIC PANEL - Abnormal; Notable for the following:       Result Value   Chloride 100 (*)    All other components within normal limits  CBC WITH DIFFERENTIAL/PLATELET  TROPONIN I  MAGNESIUM    EKG  EKG  Interpretation  Date/Time:  Sunday Jun 08 2016 02:42:05 EDT Ventricular Rate:  154 PR Interval:    QRS Duration: 97 QT Interval:  307 QTC Calculation: 492 R Axis:   27 Text Interpretation:  Atrial flutter with 2 to 1 block ST depression, probably rate related Borderline prolonged QT interval No old tracing to compare Confirmed by Delora Fuel ([id number]) on 06/08/2016 2:47:44 AM       EKG Interpretation  Date/Time:  Sunday Jun 08 2016 03:51:33 EDT Ventricular Rate:  73 PR Interval:    QRS Duration: 109 QT Interval:  383 QTC Calculation: 422 R Axis:   3 Text Interpretation:  Sinus rhythm Normal ECG When compared with ECG of EARLIER SAME DATE Sinus rhythm has replaced Atrial flutter with 2  to 1 block Nonspecific ST abnormality has resolved Confirmed by Delora Fuel ([id number]) on 06/08/2016 3:59:25 AM       Procedures Procedures (including critical care time) CRITICAL CARE Performed by: HYIFO,YDXAJ Total critical care time: 35 minutes Critical care time was exclusive of separately billable procedures and treating other patients. Critical care was necessary to treat or prevent imminent or life-threatening deterioration. Critical care was time spent personally by me on the following activities: development of treatment plan with patient and/or surrogate as well as nursing, discussions with consultants, evaluation of patient's response to treatment, examination of patient, obtaining history from patient or surrogate, ordering and performing treatments and interventions, ordering and review of laboratory studies, ordering and review of radiographic studies, pulse oximetry and re-evaluation of patient's condition.  Medications Ordered in ED Medications  apixaban (ELIQUIS) tablet 5 mg (not administered)  diltiazem (CARDIZEM) 1 mg/mL load via infusion 20 mg (not administered)    And  diltiazem (CARDIZEM) 100 mg in dextrose 5 % 100 mL (1 mg/mL) infusion (not administered)  flecainide (TAMBOCOR)  tablet 300 mg (not administered)     Initial Impression / Assessment and Plan / ED Course  I have reviewed the triage vital signs and the nursing notes.  Pertinent labs & imaging results that were available during my care of the patient were reviewed by me and considered in my medical decision making (see chart for details).  Atrial flutter with 21 conduction. Old records are reviewed confirming cardiology evaluation 2 months ago. He did not have any outpatient cardiac monitoring done. Patient was given option of immediate electrical cardioversion versus an attempt at chemical conversion with flecainide. He has chosen flecainide. He is given diltiazem for rate control and is started on apixaban for anticoagulation.  CHA2DS2-VASc Score = 1 (1 point for hypertension)  Shortly following above-noted medication, he converted to sinus rhythm. He is discharged with prescriptions for diltiazem and apixaban, and referred to the atrial fibrillation clinic. He has asked whether he can resume taking Lyrica. Review of literature does show a slight increased risk of atrial arrhythmias with taking Lyrica. He is advised to discuss this with his cardiologist.  Final Clinical Impressions(s) / ED Diagnoses   Final diagnoses:  Atrial flutter, paroxysmal (HCC)  Alcohol abuse    New Prescriptions New Prescriptions   APIXABAN (ELIQUIS) 5 MG TABS TABLET    Take 1 tablet (5 mg total) by mouth 2 (two) times daily.   DILTIAZEM (CARDIZEM CD) 120 MG 24 HR CAPSULE    Take 1 capsule (120 mg total) by mouth daily.     Delora Fuel, MD 28/78/67 [id number]    Delora Fuel, MD 94/70/96 [phone]

## 2016-06-08 NOTE — ED Triage Notes (Signed)
Pt states he was watching TV and felt like heart was racing. Checked & HR was 163.

## 2016-06-08 NOTE — ED Notes (Signed)
Pt verbalized understanding of discharge instructions. Pt ambulatory to waiting room.  

## 2016-06-12 ENCOUNTER — Encounter (HOSPITAL_COMMUNITY): Payer: Self-pay | Admitting: Nurse Practitioner

## 2016-06-12 ENCOUNTER — Ambulatory Visit (HOSPITAL_COMMUNITY)
Admission: RE | Admit: 2016-06-12 | Discharge: 2016-06-12 | Disposition: A | Discharge status: Home / Self Care | Payer: Medicaid Other | Source: Ambulatory Visit | Attending: Nurse Practitioner | Admitting: Nurse Practitioner

## 2016-06-12 VITALS — BP 118/76 | HR 73 | Ht 70.0 in | Wt 233.8 lb

## 2016-06-12 DIAGNOSIS — Z79899 Other long term (current) drug therapy: Secondary | ICD-10-CM | POA: Insufficient documentation

## 2016-06-12 DIAGNOSIS — Z8249 Family history of ischemic heart disease and other diseases of the circulatory system: Secondary | ICD-10-CM | POA: Insufficient documentation

## 2016-06-12 DIAGNOSIS — Z87891 Personal history of nicotine dependence: Secondary | ICD-10-CM | POA: Diagnosis not present

## 2016-06-12 DIAGNOSIS — Z7901 Long term (current) use of anticoagulants: Secondary | ICD-10-CM | POA: Diagnosis not present

## 2016-06-12 DIAGNOSIS — Z888 Allergy status to other drugs, medicaments and biological substances status: Secondary | ICD-10-CM | POA: Insufficient documentation

## 2016-06-12 DIAGNOSIS — Z886 Allergy status to analgesic agent status: Secondary | ICD-10-CM | POA: Insufficient documentation

## 2016-06-12 DIAGNOSIS — I10 Essential (primary) hypertension: Secondary | ICD-10-CM | POA: Diagnosis not present

## 2016-06-12 DIAGNOSIS — Z880 Allergy status to penicillin: Secondary | ICD-10-CM | POA: Insufficient documentation

## 2016-06-12 DIAGNOSIS — I4892 Unspecified atrial flutter: Secondary | ICD-10-CM | POA: Insufficient documentation

## 2016-06-12 DIAGNOSIS — Z85038 Personal history of other malignant neoplasm of large intestine: Secondary | ICD-10-CM | POA: Insufficient documentation

## 2016-06-12 DIAGNOSIS — F329 Major depressive disorder, single episode, unspecified: Secondary | ICD-10-CM | POA: Insufficient documentation

## 2016-06-12 MED ORDER — DILTIAZEM HCL ER COATED BEADS 120 MG PO CP24
120.0000 mg | ORAL_CAPSULE | Freq: Every day | ORAL | 6 refills | Status: DC
Start: 2016-06-12 — End: 2016-07-31

## 2016-06-12 MED ORDER — APIXABAN 5 MG PO TABS: 5.0000 mg | ORAL_TABLET | Freq: Two times a day (BID) | ORAL | 6 refills | Status: DC

## 2016-06-12 MED ORDER — DILTIAZEM HCL 30 MG PO TABS: ORAL_TABLET | ORAL | 1 refills | Status: DC

## 2016-06-12 NOTE — Patient Instructions (Addendum)
Cardizem 30mg  -- take 1 tablet every 4 hours AS NEEDED for palpitations or rapid heart rate   Butch Penny will be in touch with you once echo has resulted and she has discussed ekg with [name]

## 2016-06-12 NOTE — Progress Notes (Signed)
Primary Care Physician: Earl Maize, MD Referring Physician: Dr. Lonia [name] is a 56 y.o. male with a h/o HTN and palpitations that is in the afib clinic for evaluation. He has noted palpitations for few months but had sustained tachycardia and was evaluated in the ER with atrial flutter. He was given flecainide pill in pocket but converted just as he was swallowing pills. He was placed on diltiazem and apixaban for a chadsvasc score of 1. He has read all about atrial flutter and wants to have an ablation. He very much dislikes meds and wants to get off DOAC as soon as possible.  He was drinking alcohol but has quit his alcohol use when the irregular heart beat started. No tobacco or significant caffeine use. Denies a snoring history.  Today, he denies symptoms of palpitations, chest pain, shortness of breath, orthopnea, PND, lower extremity edema, dizziness, presyncope, syncope, or neurologic sequela. The patient is tolerating medications without difficulties and is otherwise without complaint today.   Past Medical History:  Diagnosis Date  . Colon cancer ([place])   . Depression   . Hypertension   . Irregular heart beat   . MS (congenital mitral stenosis)    Past Surgical History:  Procedure Laterality Date  . COLONOSCOPY N/A 04/17/2016   Procedure: COLONOSCOPY;  Surgeon: Earl Houston, MD;  [facility];  Service: Endoscopy;  Laterality: N/A;  1200  . MINOR CARPAL TUNNEL     right  . POLYPECTOMY  04/17/2016   Procedure: POLYPECTOMY;  Surgeon: Earl Houston, MD;  [facility];  Service: Endoscopy;;  sigmoid    Current Outpatient Prescriptions  Medication Sig Dispense Refill  . ALPRAZolam (XANAX) 1 MG tablet Take 1 mg by mouth 2 (two) times daily as needed for anxiety.     Marland Kitchen apixaban (ELIQUIS) 5 MG TABS tablet Take 1 tablet (5 mg total) by mouth 2 (two) times daily. 60 tablet 6  . calcium carbonate (TUMS - DOSED IN MG ELEMENTAL CALCIUM)  500 MG chewable tablet Chew 3 tablets by mouth as needed for indigestion or heartburn.    . Cyanocobalamin (B-12 COMPLIANCE INJECTION) 1000 MCG/ML KIT Inject 1,000 mcg as directed. Every 2 to 3 months    . diltiazem (CARDIZEM CD) 120 MG 24 hr capsule Take 1 capsule (120 mg total) by mouth daily. 30 capsule 6  . DULoxetine (CYMBALTA) 30 MG capsule Take 2 capsules (60 mg total) by mouth daily. 60 capsule 3  . gabapentin (NEURONTIN) 300 MG capsule Take 300-600 mg by mouth 3 (three) times daily as needed (foot pain).     Marland Kitchen ketoconazole (NIZORAL) 2 % shampoo Apply 1 application topically 2 (two) times a week. 120 mL 0  . lisinopril (PRINIVIL,ZESTRIL) 40 MG tablet TAKE ONE TABLET BY MOUTH ONCE DAILY 30 tablet 3  . pregabalin (LYRICA) 100 MG capsule Take 100 mg by mouth 2 (two) times daily.    . Vitamin D, Ergocalciferol, (DRISDOL) 50000 units CAPS capsule Take 50,000 Units by mouth every Friday.     . zinc gluconate 50 MG tablet Take 50 mg by mouth every Friday.     . diltiazem (CARDIZEM) 30 MG tablet Take 1 tablet every 4 hours AS NEEDED for palpitations/rapid heart rate 45 tablet 1  . linaclotide (LINZESS) 290 MCG CAPS capsule Take 1 capsule (290 mcg total) by mouth daily before breakfast. (Patient not taking: Reported on 06/12/2016) 30 capsule 1  . ranitidine (ZANTAC) 150 MG  tablet Take 150 mg by mouth as needed for heartburn.     Current Facility-Administered Medications  Medication Dose Route Frequency Provider Last Rate Last Dose  . cyanocobalamin ((VITAMIN B-12)) injection 1,000 mcg  1,000 mcg Intramuscular Q30 days Earl Maize, MD   1,000 mcg at 05/26/16 1520    Allergies  Allergen Reactions  . Amitriptyline Anaphylaxis  . Amoxicillin Anaphylaxis  . Asa [Aspirin] Anaphylaxis  . Clonidine Derivatives Other (See Comments)    Dry Mouth  . Hydrochlorothiazide     Caused rectal bleeding and hematuria    Social History   Social History  . Marital status: Single    Spouse name: N/A    . Number of children: N/A  . Years of education: N/A   Occupational History  . Not on file.   Social History Main Topics  . Smoking status: Former Smoker    Quit date: 03/03/1979  . Smokeless tobacco: Never Used  . Alcohol use 4.8 oz/week    8 Cans of beer per week     Comment: drinks heavily at times  . Drug use: No  . Sexual activity: Not on file   Other Topics Concern  . Not on file   Social History Narrative  . No narrative on file    Family History  Problem Relation Age of Onset  . Heart disease Father   . Coronary artery disease Father   . Coronary artery disease Paternal Grandfather     ROS- All systems are reviewed and negative except as per the HPI above  Physical Exam: Vitals:   06/12/16 1439  BP: 118/76  Pulse: 73  Weight: 233 lb 12.8 oz (106.1 kg)  Height: '5\' 10"'$  (1.778 m)   Wt Readings from Last 3 Encounters:  06/12/16 233 lb 12.8 oz (106.1 kg)  06/08/16 220 lb (99.8 kg)  05/15/16 236 lb 3.2 oz (107.1 kg)    Labs: Lab Results  Component Value Date   NA 136 06/08/2016   K 3.6 06/08/2016   CL 100 (L) 06/08/2016   CO2 27 06/08/2016   GLUCOSE 98 06/08/2016   BUN 14 06/08/2016   CREATININE 0.92 06/08/2016   CALCIUM 9.8 06/08/2016   MG 1.9 06/08/2016   No results found for: INR No results found for: CHOL, HDL, LDLCALC, TRIG   GEN- The patient is well appearing, alert and oriented x 3 today.   Head- normocephalic, atraumatic Eyes-  Sclera clear, conjunctiva pink Ears- hearing intact Oropharynx- clear Neck- supple, no JVP Lymph- no cervical lymphadenopathy Lungs- Clear to ausculation bilaterally, normal work of breathing Heart- Regular rate and rhythm, no murmurs, rubs or gallops, PMI not laterally displaced GI- soft, NT, ND, + BS Extremities- no clubbing, cyanosis, or edema MS- no significant deformity or atrophy Skin- no rash or lesion Psych- euthymic mood, full affect Neuro- strength and sensation are intact  EKG- ER ekg's  reviewed and appear to be atrial flutter, possible typical Today, NSR at 73 bpm, pr int 166 ms qrs int 100 ms qtc 458 ms    Assessment and Plan: 1. New onset atrial flutter Appears typical  Continue xarelto for a chadsvasc score of 1(htn) Continue cardizem 120 mg a day 30 mg cardizem given as well for breakthrough episodes Echo   Will refer to [name] for possible atrial flutter ablation pending echo results, as he is not a fan of meds  Butch Penny C. Mila Homer [facility] [address] [place], [place] [address]  [phone]

## 2016-06-17 ENCOUNTER — Ambulatory Visit (HOSPITAL_COMMUNITY)
Admission: RE | Admit: 2016-06-17 | Discharge: 2016-06-17 | Disposition: A | Discharge status: Home / Self Care | Payer: Medicaid Other | Source: Ambulatory Visit | Attending: Nurse Practitioner | Admitting: Nurse Practitioner

## 2016-06-17 DIAGNOSIS — I4892 Unspecified atrial flutter: Secondary | ICD-10-CM | POA: Insufficient documentation

## 2016-06-17 LAB — ECHOCARDIOGRAM COMPLETE
E decel time: 211 msec
E/e' ratio: 9.96
FS: 35 % (ref 28–44)
IVS/LV PW RATIO, ED: 0.98
LA vol A4C: 38 ml
LADIAMINDEX: 1.42 cm/m2
LASIZE: 33 mm
LEFT ATRIUM END SYS DIAM: 33 mm
LV E/e'average: 9.96
LV TDI E'LATERAL: 7.83
LV sys vol index: 12 mL/m2
LV sys vol: 27 mL
LVDIAVOL: 73 mL (ref 62–150)
LVDIAVOLIN: 31 mL/m2
LVEEMED: 9.96
LVELAT: 7.83 cm/s
LVOT SV: 94 mL
LVOT VTI: 22.6 cm
LVOT area: 4.15 cm2
LVOT diameter: 23 mm
LVOT peak grad rest: 4 mmHg
LVOT peak vel: 103 cm/s
Lateral S' vel: 11.7 cm/s
MV Dec: 211
MV pk A vel: 68.2 m/s
MV pk E vel: 78 m/s
MVPG: 2 mmHg
PW: 10.1 mm — AB (ref 0.6–1.1)
RV sys press: 16 mmHg
Reg peak vel: 181 cm/s
Simpson's disk: 63
Stroke v: 46 ml
TAPSE: 21.2 mm
TDI e' medial: 7.4
TRMAXVEL: 181 cm/s

## 2016-06-17 NOTE — Progress Notes (Signed)
*  PRELIMINARY RESULTS* Echocardiogram 2D Echocardiogram has been performed.  Earl [name] 06/17/2016, 3:33 PM

## 2016-06-25 ENCOUNTER — Ambulatory Visit: Payer: Medicaid Other

## 2016-06-25 ENCOUNTER — Telehealth: Payer: Self-pay | Admitting: Internal Medicine

## 2016-06-25 NOTE — Telephone Encounter (Signed)
Unsure of who called patient but I did review his upcoming appt with [name] and his echo results. Pt will call if further questions.

## 2016-06-25 NOTE — Telephone Encounter (Signed)
New Message ° ° pt verbalized that she is returning call for rn °

## 2016-06-30 ENCOUNTER — Ambulatory Visit (INDEPENDENT_AMBULATORY_CARE_PROVIDER_SITE_OTHER): Payer: Medicaid Other | Admitting: Internal Medicine

## 2016-06-30 ENCOUNTER — Encounter: Payer: Self-pay | Admitting: Internal Medicine

## 2016-06-30 VITALS — BP 119/74 | HR 63 | Ht 70.0 in | Wt 237.6 lb

## 2016-06-30 DIAGNOSIS — F1020 Alcohol dependence, uncomplicated: Secondary | ICD-10-CM | POA: Diagnosis not present

## 2016-06-30 DIAGNOSIS — I4892 Unspecified atrial flutter: Secondary | ICD-10-CM | POA: Diagnosis not present

## 2016-06-30 DIAGNOSIS — I10 Essential (primary) hypertension: Secondary | ICD-10-CM

## 2016-06-30 NOTE — Patient Instructions (Signed)
Medication Instructions:  Your physician recommends that you continue on your current medications as directed. Please refer to the Current Medication list given to you today.   Labwork: Your physician recommends that you return for lab work on 07/23/16 at 2pm---you do not have to fast   Testing/Procedures:  Your physician has recommended that you have an ablation. Catheter ablation is a medical procedure used to treat some cardiac arrhythmias (irregular heartbeats). During catheter ablation, a long, thin, flexible tube is put into a blood vessel in your groin (upper thigh), or neck. This tube is called an ablation catheter. It is then guided to your heart through the blood vessel. Radio frequency waves destroy small areas of heart tissue where abnormal heartbeats may cause an arrhythmia to start. Please see the instruction sheet given to you today.---07/31/16  Please arrive at [facility] at 5:30am Do not eat or drink after midnight the night prior to the procedure Do not take any medications the morning of the test Plan for one night stay Will need someone to drive you home at discharge      Follow-Up: Your physician recommends that you schedule a follow-up appointment in: 4 weeks from 07/31/16 with Dr Rayann Heman   Any Other Special Instructions Will Be Listed Below (If Applicable).     If you need a refill on your cardiac medications before your next appointment, please call your pharmacy.

## 2016-06-30 NOTE — Progress Notes (Signed)
Electrophysiology Office Note   Date:  07/02/2016   ID:  Earl [name], DOB Jun 05, 1960, MRN [id number]  PCP:  [name], MD  Cardiologist:  Dr Bronson Ing Primary Electrophysiologist: [name], MD    Chief Complaint  Patient presents with  . Atrial Flutter     History of Present Illness: Earl [name] is a 56 y.o. male who presents today for electrophysiology evaluation.   The patient has typical appearing atrial flutter.  He is referred for EP consultation regarding therapeutic strategies by Dr Bronson Ing and Roderic Palau NP.  He has HTN and heavy ETOH use.  Recently, he developed tachypalpitations.  He was evaluated and noted to have atrial flutter.  He has been placed on eliquis and diltiazem.  He worries about tolerance of medicines long term.  She reports fatigue and "nervousness" when in atrial flutter.  Today, he denies symptoms of chest pain, shortness of breath, orthopnea, PND, lower extremity edema, claudication, dizziness, presyncope, syncope, snoring, bleeding, or neurologic sequela. The patient is tolerating medications without difficulties and is otherwise without complaint today.    Past Medical History:  Diagnosis Date  . Colon cancer ([place])   . Depression   . Hypertension   . Typical atrial flutter [name])    Past Surgical History:  Procedure Laterality Date  . COLONOSCOPY N/A 04/17/2016   Procedure: COLONOSCOPY;  Surgeon: [name], MD;  [facility];  Service: Endoscopy;  Laterality: N/A;  1200  . MINOR CARPAL TUNNEL     right  . POLYPECTOMY  04/17/2016   Procedure: POLYPECTOMY;  Surgeon: [name], MD;  Location: [facility];  Service: Endoscopy;;  sigmoid     Current Outpatient Prescriptions  Medication Sig Dispense Refill  . ALPRAZolam (XANAX) 1 MG tablet Take 1 mg by mouth 2 (two) times daily as needed for anxiety.     Marland Kitchen apixaban (ELIQUIS) 5 MG TABS tablet Take 1 tablet (5 mg total) by mouth 2 (two) times daily. 60  tablet 6  . calcium carbonate (TUMS - DOSED IN MG ELEMENTAL CALCIUM) 500 MG chewable tablet Chew 3 tablets by mouth as needed for indigestion or heartburn (Use as directed).     . Cyanocobalamin (B-12 COMPLIANCE INJECTION) 1000 MCG/ML KIT Inject 1,000 mcg as directed. Every 2 to 3 months    . diltiazem (CARDIZEM CD) 120 MG 24 hr capsule Take 1 capsule (120 mg total) by mouth daily. 30 capsule 6  . diltiazem (CARDIZEM) 30 MG tablet Take 1 tablet by mouth every 4 hours AS NEEDED for palpitations/rapid heart rate    . DULoxetine (CYMBALTA) 30 MG capsule Take 2 capsules (60 mg total) by mouth daily. 60 capsule 3  . gabapentin (NEURONTIN) 300 MG capsule Take 300-600 mg by mouth 3 (three) times daily as needed (foot pain).     Marland Kitchen ketoconazole (NIZORAL) 2 % shampoo Apply 1 application topically 2 (two) times a week. 120 mL 0  . pregabalin (LYRICA) 100 MG capsule Take 100 mg by mouth 2 (two) times daily.    . ranitidine (ZANTAC) 150 MG tablet Take 150 mg by mouth daily as needed for heartburn.     . Vitamin D, Ergocalciferol, (DRISDOL) 50000 units CAPS capsule Take 50,000 Units by mouth every Friday.     . zinc gluconate 50 MG tablet Take 50 mg by mouth every Friday.     . linaclotide (LINZESS) 72 MCG capsule Take 1 capsule (72 mcg total) by mouth daily before breakfast.  30 capsule 3  . lisinopril (PRINIVIL,ZESTRIL) 40 MG tablet Take 1 tablet (40 mg total) by mouth daily. 90 tablet 1   Current Facility-Administered Medications  Medication Dose Route Frequency Provider Last Rate Last Dose  . cyanocobalamin ((VITAMIN B-12)) injection 1,000 mcg  1,000 mcg Intramuscular Q30 days [name], MD   1,000 mcg at 07/01/16 1554    Allergies:   Amitriptyline; Amoxicillin; Asa [aspirin]; Clonidine derivatives; and Hydrochlorothiazide   Social History:  The patient  reports that he quit smoking about 37 years ago. He has never used smokeless tobacco. He reports that he drinks about 4.8 oz of alcohol per week .  He reports that he does not use drugs.   Family History:  [name]  family history includes Coronary artery disease in his father and paternal grandfather; Heart disease in his father.    ROS:  Please see the history of present illness.   All other systems are personally reviewed and negative.    PHYSICAL EXAM: VS:  BP 119/74   Pulse 63   Ht 5\' 10"  (1.778 m)   Wt 237 lb 9.6 oz (107.8 kg)   SpO2 96%   BMI 34.09 kg/m  , BMI Body mass index is 34.09 kg/m. GEN: Well nourished, well developed, in no acute distress  HEENT: normal  Neck: no JVD, carotid bruits, or masses Cardiac: RRR; no murmurs, rubs, or gallops,no edema  Respiratory:  clear to auscultation bilaterally, normal work of breathing GI: soft, nontender, nondistended, + BS MS: no deformity or atrophy  Skin: warm and dry  Neuro:  Strength and sensation are intact Psych: euthymic mood, full affect  EKG:  EKG is ordered today. The ekg ordered today is personally reviewed and shows sinus rhythm 63 bpm, PR 180 msec, QRS 110 msec, Qtc 440 msec, otherwise normal ekg   Recent Labs: 06/08/2016: ALT 28; BUN 14; Creatinine, Ser 0.92; Hemoglobin 16.4; Magnesium 1.9; Platelets 198; Potassium 3.6; Sodium 136  personally reviewed   Lipid Panel  No results found for: CHOL, TRIG, HDL, CHOLHDL, VLDL, LDLCALC, LDLDIRECT personally reviewed   Wt Readings from Last 3 Encounters:  07/01/16 233 lb (105.7 kg)  06/30/16 237 lb 9.6 oz (107.8 kg)  06/12/16 233 lb 12.8 oz (106.1 kg)      Other studies personally reviewed: Additional studies/ records that were reviewed today include: AF clinic notes , echo 06/17/16 Review of the above records today demonstrates: EF 55-60%, no significant valvular disease, normal LA size   ASSESSMENT AND PLAN:  1.  Typical appearing atrial flutter The patient had typical appearing atrial flutter documented 06/08/16 on ekg.  He has not had afib documented.  Therapeutic strategies for atrial flutter  including medicine and ablation were discussed in detail with the patient today. Risk, benefits, and alternatives to EP study and radiofrequency ablation were also discussed in detail today. These risks include but are not limited to stroke, bleeding, vascular damage, tamponade, perforation, damage to the heart and other structures, AV block requiring pacemaker, worsening renal function, and death. The patient understands these risk and wishes to proceed.  We will therefore proceed with catheter ablation at the next available time. He will continue eliquis in the short term.  2. ETOH We discussed avoidance in order to control atrial flutter and prevent atrial fibrillation in the future.  3. HTN Stable No change required today    Current medicines are reviewed at length with the patient today.   The patient does not have concerns  regarding his medicines.  The following changes were made today:  none  Labs/ tests ordered today include:  Orders Placed This Encounter  Procedures  . Basic metabolic panel  . CBC with Differential  . EKG 12-Lead     Signed, [name], MD    [place] [address] [phone] (office) [phone] (fax)

## 2016-07-01 ENCOUNTER — Encounter: Payer: Self-pay | Admitting: Pediatrics

## 2016-07-01 ENCOUNTER — Ambulatory Visit (INDEPENDENT_AMBULATORY_CARE_PROVIDER_SITE_OTHER): Payer: Medicaid Other | Admitting: Pediatrics

## 2016-07-01 VITALS — BP 124/88 | HR 70 | Temp 96.8°F | Ht 70.0 in | Wt 233.0 lb

## 2016-07-01 DIAGNOSIS — F419 Anxiety disorder, unspecified: Secondary | ICD-10-CM

## 2016-07-01 DIAGNOSIS — K5900 Constipation, unspecified: Secondary | ICD-10-CM | POA: Diagnosis not present

## 2016-07-01 DIAGNOSIS — I10 Essential (primary) hypertension: Secondary | ICD-10-CM

## 2016-07-01 DIAGNOSIS — E538 Deficiency of other specified B group vitamins: Secondary | ICD-10-CM | POA: Diagnosis not present

## 2016-07-01 MED ORDER — LINACLOTIDE 72 MCG PO CAPS: 72.0000 ug | ORAL_CAPSULE | Freq: Every day | ORAL | 3 refills | Status: DC

## 2016-07-01 MED ORDER — LISINOPRIL 40 MG PO TABS: 40.0000 mg | ORAL_TABLET | Freq: Every day | ORAL | 1 refills | Status: DC
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
## 2016-07-01 NOTE — Patient Instructions (Addendum)
[email or web address]  Your provider wants you to schedule an appointment with a Psychologist/Psychiatrist. The following list of offices requires the patient to call and make their own appointment, as there is information they need that only you can provide. Please feel free to choose form the following providers:  [place] in [place]  [place]  [phone] [place], [place]  (Scheduled through [place]) Must call and do an interview for appointment. Sees Children / Accepts Medicaid  [facility] [place]  [address] [place], [place]  [phone] [address] [place], [place] for Autism but does not treat it Sees Children / Accepts Medicaid  [facility]    [phone] [address]   [place], [place] Medication management, substance abuse, bipolar, grief, family, marriage, OCD, anxiety, PTSD Sees children / Accepts Medicaid  [facility]    [phone] [address] [place], [place] children / Accepts [facility]  [facility]  [phone] [address] [place], [place]   Dr Lorenza Evangelist     [phone] [address] [place], [place]  Sees ADD & ADHD for treatment Accepts [facility]  [facility]  [phone] [address] [place], [place] for Autism Accepts Medicaid  [facility]   [phone] [address], [place] children as young as 70 years old Accepts [facility]     [phone]    [place], [place] [address] Sees children Accepts Medicaid

## 2016-07-02 ENCOUNTER — Encounter: Payer: Self-pay | Admitting: Internal Medicine

## 2016-07-23 ENCOUNTER — Encounter (INDEPENDENT_AMBULATORY_CARE_PROVIDER_SITE_OTHER): Payer: Self-pay | Admitting: *Deleted

## 2016-07-23 ENCOUNTER — Other Ambulatory Visit: Payer: Medicaid Other | Admitting: *Deleted

## 2016-07-23 ENCOUNTER — Other Ambulatory Visit: Payer: Medicaid Other

## 2016-07-23 ENCOUNTER — Other Ambulatory Visit (INDEPENDENT_AMBULATORY_CARE_PROVIDER_SITE_OTHER): Payer: Self-pay | Admitting: *Deleted

## 2016-07-23 DIAGNOSIS — I4892 Unspecified atrial flutter: Secondary | ICD-10-CM

## 2016-07-23 DIAGNOSIS — K625 Hemorrhage of anus and rectum: Secondary | ICD-10-CM

## 2016-07-23 DIAGNOSIS — C187 Malignant neoplasm of sigmoid colon: Secondary | ICD-10-CM

## 2016-07-24 LAB — CBC WITH DIFFERENTIAL/PLATELET
Basophils Absolute: 0 10*3/uL (ref 0.0–0.2)
Basos: 0 %
EOS (ABSOLUTE): 0.1 10*3/uL (ref 0.0–0.4)
EOS: 2 %
HEMATOCRIT: 43 % (ref 37.5–51.0)
HEMOGLOBIN: 14.9 g/dL (ref 13.0–17.7)
IMMATURE GRANS (ABS): 0 10*3/uL (ref 0.0–0.1)
IMMATURE GRANULOCYTES: 0 %
LYMPHS ABS: 1.6 10*3/uL (ref 0.7–3.1)
Lymphs: 26 %
MCH: 30.9 pg (ref 26.6–33.0)
MCHC: 34.7 g/dL (ref 31.5–35.7)
MCV: 89 fL (ref 79–97)
MONOCYTES: 9 %
Monocytes Absolute: 0.6 10*3/uL (ref 0.1–0.9)
Neutrophils Absolute: 3.8 10*3/uL (ref 1.4–7.0)
Neutrophils: 63 %
Platelets: 201 10*3/uL (ref 150–379)
RBC: 4.82 x10E6/uL (ref 4.14–5.80)
RDW: 13.6 % (ref 12.3–15.4)
WBC: 6.2 10*3/uL (ref 3.4–10.8)

## 2016-07-24 LAB — BASIC METABOLIC PANEL
BUN / CREAT RATIO: 14 (ref 9–20)
BUN: 12 mg/dL (ref 6–24)
CO2: 19 mmol/L — ABNORMAL LOW (ref 20–29)
CREATININE: 0.84 mg/dL (ref 0.76–1.27)
Calcium: 7.9 mg/dL — ABNORMAL LOW (ref 8.7–10.2)
Chloride: 106 mmol/L (ref 96–106)
GFR calc Af Amer: 112 mL/min/{1.73_m2} (ref >59)
GFR calc non Af Amer: 97 mL/min/{1.73_m2} (ref >59)
Glucose: 91 mg/dL (ref 65–99)
Potassium: 4.4 mmol/L (ref 3.5–5.2)
Sodium: 142 mmol/L (ref 134–144)

## 2016-07-29 ENCOUNTER — Other Ambulatory Visit: Payer: Self-pay | Admitting: Pediatrics

## 2016-07-29 NOTE — Progress Notes (Signed)
Pt aware and he will come by Friday afternoon or Monday for the calcium re-draw

## 2016-07-31 ENCOUNTER — Ambulatory Visit (HOSPITAL_COMMUNITY): Payer: Medicaid Other | Admitting: Certified Registered Nurse Anesthetist

## 2016-07-31 ENCOUNTER — Ambulatory Visit (HOSPITAL_COMMUNITY)
Admission: RE | Admit: 2016-07-31 | Discharge: 2016-07-31 | Disposition: A | Discharge status: Home / Self Care | Payer: Medicaid Other | Source: Ambulatory Visit | Attending: Internal Medicine | Admitting: Internal Medicine

## 2016-07-31 ENCOUNTER — Encounter (HOSPITAL_COMMUNITY)
Admission: RE | Disposition: A | Discharge status: Home / Self Care | Payer: Self-pay | Source: Ambulatory Visit | Attending: Internal Medicine

## 2016-07-31 DIAGNOSIS — Z87891 Personal history of nicotine dependence: Secondary | ICD-10-CM | POA: Diagnosis not present

## 2016-07-31 DIAGNOSIS — Z85038 Personal history of other malignant neoplasm of large intestine: Secondary | ICD-10-CM | POA: Diagnosis not present

## 2016-07-31 DIAGNOSIS — I471 Supraventricular tachycardia: Secondary | ICD-10-CM | POA: Insufficient documentation

## 2016-07-31 DIAGNOSIS — Z880 Allergy status to penicillin: Secondary | ICD-10-CM | POA: Insufficient documentation

## 2016-07-31 DIAGNOSIS — I483 Typical atrial flutter: Secondary | ICD-10-CM | POA: Diagnosis not present

## 2016-07-31 DIAGNOSIS — F329 Major depressive disorder, single episode, unspecified: Secondary | ICD-10-CM | POA: Insufficient documentation

## 2016-07-31 DIAGNOSIS — Z8249 Family history of ischemic heart disease and other diseases of the circulatory system: Secondary | ICD-10-CM | POA: Insufficient documentation

## 2016-07-31 DIAGNOSIS — I10 Essential (primary) hypertension: Secondary | ICD-10-CM | POA: Insufficient documentation

## 2016-07-31 DIAGNOSIS — Z886 Allergy status to analgesic agent status: Secondary | ICD-10-CM | POA: Insufficient documentation

## 2016-07-31 DIAGNOSIS — I4892 Unspecified atrial flutter: Secondary | ICD-10-CM | POA: Diagnosis not present

## 2016-07-31 HISTORY — PX: A-FLUTTER ABLATION: EP1230

## 2016-07-31 SURGERY — A-FLUTTER ABLATION
Anesthesia: General

## 2016-07-31 MED ORDER — ISOPROTERENOL HCL 0.2 MG/ML IJ SOLN
INTRAMUSCULAR | Status: AC
  Filled 2016-07-31: qty 5

## 2016-07-31 MED ORDER — FENTANYL CITRATE (PF) 100 MCG/2ML IJ SOLN
INTRAMUSCULAR | Status: DC | PRN
  Administered 2016-07-31 (×2): 25 ug via INTRAVENOUS
  Administered 2016-07-31: 50 ug via INTRAVENOUS
  Administered 2016-07-31 (×2): 25 ug via INTRAVENOUS
  Administered 2016-07-31: 50 ug via INTRAVENOUS
  Administered 2016-07-31 (×4): 25 ug via INTRAVENOUS

## 2016-07-31 MED ORDER — BUPIVACAINE HCL (PF) 0.25 % IJ SOLN
INTRAMUSCULAR | Status: DC | PRN
  Administered 2016-07-31: 20 mL

## 2016-07-31 MED ORDER — SODIUM CHLORIDE 0.9 % IV SOLN: 250.0000 mL | INTRAVENOUS | Status: DC | PRN

## 2016-07-31 MED ORDER — HYDROCODONE-ACETAMINOPHEN 5-325 MG PO TABS
ORAL_TABLET | ORAL | Status: AC
  Filled 2016-07-31: qty 2

## 2016-07-31 MED ORDER — HYDROMORPHONE HCL 1 MG/ML IJ SOLN: 0.2500 mg | INTRAMUSCULAR | Status: DC | PRN

## 2016-07-31 MED ORDER — ALBUTEROL SULFATE HFA 108 (90 BASE) MCG/ACT IN AERS
INHALATION_SPRAY | RESPIRATORY_TRACT | Status: DC | PRN
  Administered 2016-07-31: 2 via RESPIRATORY_TRACT

## 2016-07-31 MED ORDER — SODIUM CHLORIDE 0.9% FLUSH: 3.0000 mL | INTRAVENOUS | Status: DC | PRN

## 2016-07-31 MED ORDER — HYDROCODONE-ACETAMINOPHEN 5-325 MG PO TABS
1.0000 | ORAL_TABLET | ORAL | Status: DC | PRN
Start: 2016-07-31 — End: 2016-07-31
  Administered 2016-07-31: 2 via ORAL
  Filled 2016-07-31: qty 2

## 2016-07-31 MED ORDER — ONDANSETRON HCL 4 MG/2ML IJ SOLN
INTRAMUSCULAR | Status: DC | PRN
  Administered 2016-07-31: 4 mg via INTRAVENOUS

## 2016-07-31 MED ORDER — PROPOFOL 10 MG/ML IV BOLUS
INTRAVENOUS | Status: DC | PRN
  Administered 2016-07-31: 30 mg via INTRAVENOUS
  Administered 2016-07-31: 200 mg via INTRAVENOUS

## 2016-07-31 MED ORDER — MEPERIDINE HCL 25 MG/ML IJ SOLN: 6.2500 mg | INTRAMUSCULAR | Status: DC | PRN

## 2016-07-31 MED ORDER — MIDAZOLAM HCL 5 MG/5ML IJ SOLN
INTRAMUSCULAR | Status: DC | PRN
  Administered 2016-07-31: 2 mg via INTRAVENOUS

## 2016-07-31 MED ORDER — SODIUM CHLORIDE 0.9% FLUSH: 3.0000 mL | Freq: Two times a day (BID) | INTRAVENOUS | Status: DC

## 2016-07-31 MED ORDER — ONDANSETRON HCL 4 MG/2ML IJ SOLN: 4.0000 mg | Freq: Once | INTRAMUSCULAR | Status: DC | PRN

## 2016-07-31 MED ORDER — LIDOCAINE HCL (CARDIAC) 20 MG/ML IV SOLN
INTRAVENOUS | Status: DC | PRN
  Administered 2016-07-31: 100 mg via INTRAVENOUS

## 2016-07-31 MED ORDER — DEXTROSE 5 % IV SOLN
INTRAVENOUS | Status: DC | PRN
  Administered 2016-07-31: 10 ug/min via INTRAVENOUS

## 2016-07-31 MED ORDER — SODIUM CHLORIDE 0.9 % IV SOLN
INTRAVENOUS | Status: DC
Start: 2016-07-31 — End: 2016-07-31
  Administered 2016-07-31 (×2): via INTRAVENOUS

## 2016-07-31 MED ORDER — BUPIVACAINE HCL (PF) 0.25 % IJ SOLN
INTRAMUSCULAR | Status: AC
  Filled 2016-07-31: qty 30

## 2016-07-31 SURGICAL SUPPLY — 11 items
BAG SNAP BAND KOVER 36X36 (MISCELLANEOUS) ×3 IMPLANT
BLANKET WARM UNDERBOD FULL ACC (MISCELLANEOUS) ×3 IMPLANT
CATH EZ STEER NAV 8MM F-J CUR (ABLATOR) ×3 IMPLANT
CATH WEBSTER BI DIR CS D-F CRV (CATHETERS) ×3 IMPLANT
GLIDESHEATH SLENDER 7FR .021G (SHEATH) IMPLANT
PACK EP LATEX FREE (CUSTOM PROCEDURE TRAY) ×2
PACK EP LF (CUSTOM PROCEDURE TRAY) ×1 IMPLANT
PAD DEFIB LIFELINK (PAD) ×3 IMPLANT
PATCH CARTO3 (PAD) ×3 IMPLANT
SHEATH PINNACLE 7F 10CM (SHEATH) ×6 IMPLANT
SHEATH PINNACLE 8F 10CM (SHEATH) ×3 IMPLANT

## 2016-07-31 NOTE — Progress Notes (Signed)
Site area: rt groin fv sheaths Site Prior to Removal:  Level 0 Pressure Applied For: 20 minutes Manual:   yes Patient Status During Pull:  stable Post Pull Site:  Level 0 Post Pull Instructions Given:  yes Post Pull Pulses Present: palpable Dressing Applied:  Gauze and tegaderm Bedrest begins @ 1050 Comments: IV saline locked

## 2016-07-31 NOTE — H&P (Signed)
Cardiologist:  Dr Bronson Ing Primary Electrophysiologist: [name], MD          Chief Complaint  Patient presents with  . Atrial Flutter     History of Present Illness: Earl [name] is a 56 y.o. male who presents today for atrial flutter ablation.   The patient has typical appearing atrial flutter.  He is referred for EP consultation regarding therapeutic strategies by Dr Bronson Ing and Roderic Palau NP.  He has HTN and heavy ETOH use.  Recently, he developed tachypalpitations.  He was evaluated and noted to have atrial flutter.  He has been placed on eliquis and diltiazem.  He worries about tolerance of medicines long term.  She reports fatigue and "nervousness" when in atrial flutter. no afib history. Today, he denies symptoms of chest pain, shortness of breath, orthopnea, PND, lower extremity edema, claudication, dizziness, presyncope, syncope, snoring, bleeding, or neurologic sequela. The patient is tolerating medications without difficulties and is otherwise without complaint today.        Past Medical History:  Diagnosis Date  . Colon cancer ([place])   . Depression   . Hypertension   . Typical atrial flutter [facility])         Past Surgical History:  Procedure Laterality Date  . COLONOSCOPY N/A 04/17/2016   Procedure: COLONOSCOPY;  Surgeon: [name], MD;  [facility];  Service: Endoscopy;  Laterality: N/A;  1200  . MINOR CARPAL TUNNEL     right  . POLYPECTOMY  04/17/2016   Procedure: POLYPECTOMY;  Surgeon: [name], MD;  [facility];  Service: Endoscopy;;  sigmoid           Current Outpatient Prescriptions  Medication Sig Dispense Refill  . ALPRAZolam (XANAX) 1 MG tablet Take 1 mg by mouth 2 (two) times daily as needed for anxiety.     Marland Kitchen apixaban (ELIQUIS) 5 MG TABS tablet Take 1 tablet (5 mg total) by mouth 2 (two) times daily. 60 tablet 6  . calcium carbonate (TUMS - DOSED IN MG ELEMENTAL CALCIUM) 500 MG  chewable tablet Chew 3 tablets by mouth as needed for indigestion or heartburn (Use as directed).     . Cyanocobalamin (B-12 COMPLIANCE INJECTION) 1000 MCG/ML KIT Inject 1,000 mcg as directed. Every 2 to 3 months    . diltiazem (CARDIZEM CD) 120 MG 24 hr capsule Take 1 capsule (120 mg total) by mouth daily. 30 capsule 6  . diltiazem (CARDIZEM) 30 MG tablet Take 1 tablet by mouth every 4 hours AS NEEDED for palpitations/rapid heart rate    . DULoxetine (CYMBALTA) 30 MG capsule Take 2 capsules (60 mg total) by mouth daily. 60 capsule 3  . gabapentin (NEURONTIN) 300 MG capsule Take 300-600 mg by mouth 3 (three) times daily as needed (foot pain).     Marland Kitchen ketoconazole (NIZORAL) 2 % shampoo Apply 1 application topically 2 (two) times a week. 120 mL 0  . pregabalin (LYRICA) 100 MG capsule Take 100 mg by mouth 2 (two) times daily.    . ranitidine (ZANTAC) 150 MG tablet Take 150 mg by mouth daily as needed for heartburn.     . Vitamin D, Ergocalciferol, (DRISDOL) 50000 units CAPS capsule Take 50,000 Units by mouth every Friday.     . zinc gluconate 50 MG tablet Take 50 mg by mouth every Friday.     . linaclotide (LINZESS) 72 MCG capsule Take 1 capsule (72 mcg total) by mouth daily before breakfast. 30 capsule 3  .  lisinopril (PRINIVIL,ZESTRIL) 40 MG tablet Take 1 tablet (40 mg total) by mouth daily. 90 tablet 1            Current Facility-Administered Medications  Medication Dose Route Frequency Provider Last Rate Last Dose  . cyanocobalamin ((VITAMIN B-12)) injection 1,000 mcg  1,000 mcg Intramuscular Q30 days [name], MD   1,000 mcg at 07/01/16 1554    Allergies:   Amitriptyline; Amoxicillin; Asa [aspirin]; Clonidine derivatives; and Hydrochlorothiazide   Social History:  The patient  reports that he quit smoking about 37 years ago. He has never used smokeless tobacco. He reports that he drinks about 4.8 oz of alcohol per week . He reports that he does not use drugs.    Family History:  [name]  family history includes Coronary artery disease in his father and paternal grandfather; Heart disease in his father.    ROS:  Please see the history of present illness.   All other systems are personally reviewed and negative.    PHYSICAL EXAM: VS:   Vitals:   07/31/16 0539 07/31/16 0619  BP: (!) 151/90   Pulse: 64   Resp:  18  Temp: 97.9 F (36.6 C)     GEN: Well nourished, well developed, in no acute distress  HEENT: normal  Neck: no JVD, carotid bruits, or masses Cardiac: RRR; no murmurs, rubs, or gallops,no edema  Respiratory:  clear to auscultation bilaterally, normal work of breathing GI: soft, nontender, nondistended, + BS MS: no deformity or atrophy  Skin: warm and dry  Neuro:  Strength and sensation are intact Psych: euthymic mood, full affect    Other studies personally reviewed: Additional studies/ records that were reviewed today include: AF clinic notes , echo 06/17/16 Review of the above records today demonstrates: EF 55-60%, no significant valvular disease, normal LA size   ASSESSMENT AND PLAN:  1.  Typical appearing atrial flutter The patient had typical appearing atrial flutter documented 06/08/16 on ekg.  He has not had afib documented.  Therapeutic strategies for atrial flutter including medicine and ablation were discussed in detail with the patient today. Risk, benefits, and alternatives to EP study and radiofrequency ablation were also discussed in detail today. These risks include but are not limited to stroke, bleeding, vascular damage, tamponade, perforation, damage to the heart and other structures, AV block requiring pacemaker, worsening renal function, and death. The patient understands these risk and wishes to proceed.  He reports compliance with eliquis without interruption since his last visit with me.   2. ETOH I have advised that he quit. I worry that this may be an issue for him going  forward.  [name] MD, [facility] 07/31/2016 7:25 AM

## 2016-07-31 NOTE — Discharge Instructions (Signed)
No driving for 3 days. No lifting over 5 lbs for 1 week. No sexual activity for 1 week. You may return to work in 1 week. Keep procedure site clean & dry. If you notice increased pain, swelling, bleeding or pus, call/return!  You may shower, but no soaking baths/hot tubs/pools for 1 week.   Angiogram, Care After This sheet gives you information about how to care for yourself after your procedure. Your health care provider may also give you more specific instructions. If you have problems or questions, contact your health care provider. What can I expect after the procedure? After the procedure, it is common to have bruising and tenderness at the catheter insertion area. Follow these instructions at home: Insertion site care  Follow instructions from your health care provider about how to take care of your insertion site. Make sure you: ? Wash your hands with soap and water before you change your bandage (dressing). If soap and water are not available, use hand sanitizer. ? Change your dressing as told by your health care provider. ? Leave stitches (sutures), skin glue, or adhesive strips in place. These skin closures may need to stay in place for 2 weeks or longer. If adhesive strip edges start to loosen and curl up, you may trim the loose edges. Do not remove adhesive strips completely unless your health care provider tells you to do that.  Do not take baths, swim, or use a hot tub until your health care provider approves.  You may shower 24-48 hours after the procedure or as told by your health care provider. ? Gently wash the site with plain soap and water. ? Pat the area dry with a clean towel. ? Do not rub the site. This may cause bleeding.  Do not apply powder or lotion to the site. Keep the site clean and dry.  Check your insertion site every day for signs of infection. Check for: ? Redness, swelling, or pain. ? Fluid or blood. ? Warmth. ? Pus or a bad smell. Activity  Rest as  told by your health care provider, usually for 1-2 days.  Do not lift anything that is heavier than 10 lbs. (4.5 kg) or as told by your health care provider.  Do not drive for 24 hours if you were given a medicine to help you relax (sedative).  Do not drive or use heavy machinery while taking prescription pain medicine. General instructions  Return to your normal activities as told by your health care provider, usually in about a week. Ask your health care provider what activities are safe for you.  If the catheter site starts bleeding, lie flat and put pressure on the site. If the bleeding does not stop, get help right away. This is a medical emergency.  Drink enough fluid to keep your urine clear or pale yellow. This helps flush the contrast dye from your body.  Take over-the-counter and prescription medicines only as told by your health care provider.  Keep all follow-up visits as told by your health care provider. This is important. Contact a health care provider if:  You have a fever or chills.  You have redness, swelling, or pain around your insertion site.  You have fluid or blood coming from your insertion site.  The insertion site feels warm to the touch.  You have pus or a bad smell coming from your insertion site.  You have bruising around the insertion site.  You notice blood collecting in the tissue around  the catheter site (hematoma). The hematoma may be painful to the touch. Get help right away if:  You have severe pain at the catheter insertion area.  The catheter insertion area swells very fast.  The catheter insertion area is bleeding, and the bleeding does not stop when you hold steady pressure on the area.  The area near or just beyond the catheter insertion site becomes pale, cool, tingly, or numb. These symptoms may represent a serious problem that is an emergency. Do not wait to see if the symptoms will go away. Get medical help right away. Call your  local emergency services (911 in the [place] Do not drive yourself to the hospital. Summary  After the procedure, it is common to have bruising and tenderness at the catheter insertion area.  After the procedure, it is important to rest and drink plenty of fluids.  Do not take baths, swim, or use a hot tub until your health care provider says it is okay to do so. You may shower 24-48 hours after the procedure or as told by your health care provider.  If the catheter site starts bleeding, lie flat and put pressure on the site. If the bleeding does not stop, get help right away. This is a medical emergency. This information is not intended to replace advice given to you by your health care provider. Make sure you discuss any questions you have with your health care provider. Document Released: 08/01/2004 Document Revised: 12/19/2015 Document Reviewed: 12/19/2015 Elsevier Interactive Patient Education  2017 [employer].

## 2016-07-31 NOTE — Anesthesia Preprocedure Evaluation (Signed)
Anesthesia Evaluation  Patient identified by MRN, date of birth, ID band Patient awake    Reviewed: Allergy & Precautions, NPO status , Patient's Chart, lab work & pertinent test results  Airway Mallampati: I  TM Distance: >3 FB Neck ROM: Full    Dental   Pulmonary former smoker,    Pulmonary exam normal        Cardiovascular hypertension, Pt. on medications Normal cardiovascular exam     Neuro/Psych Depression    GI/Hepatic   Endo/Other    Renal/GU      Musculoskeletal   Abdominal   Peds  Hematology   Anesthesia Other Findings   Reproductive/Obstetrics                             Anesthesia Physical Anesthesia Plan  ASA: III  Anesthesia Plan: General   Post-op Pain Management:    Induction: Intravenous  PONV Risk Score and Plan: 2 and Ondansetron and Treatment may vary due to age or medical condition  Airway Management Planned: LMA  Additional Equipment:   Intra-op Plan:   Post-operative Plan: Extubation in OR  Informed Consent: I have reviewed the patients History and Physical, chart, labs and discussed the procedure including the risks, benefits and alternatives for the proposed anesthesia with the patient or authorized representative who has indicated his/her understanding and acceptance.     Plan Discussed with: CRNA and Surgeon  Anesthesia Plan Comments:         Anesthesia Quick Evaluation

## 2016-07-31 NOTE — Transfer of Care (Signed)
Immediate Anesthesia Transfer of Care Note  Patient: Earl [name]  Procedure(s) Performed: Procedure(s): A-Flutter Ablation (N/A)  Patient Location: PACU and Cath Lab  Anesthesia Type:General  Level of Consciousness: awake, alert , oriented and patient cooperative  Airway & Oxygen Therapy: Patient Spontanous Breathing and Patient connected to face mask oxygen  Post-op Assessment: Report given to RN and Post -op Vital signs reviewed and stable  Post vital signs: Reviewed and stable  Last Vitals:  Vitals:   07/31/16 0539 07/31/16 0619  BP: (!) 151/90   Pulse: 64   Resp:  18  Temp: 36.6 C     Last Pain:  Vitals:   07/31/16 0539  TempSrc: Oral         Complications: No apparent anesthesia complications

## 2016-07-31 NOTE — Anesthesia Procedure Notes (Signed)
Procedure Name: LMA Insertion Date/Time: 07/31/2016 7:57 AM Performed by: Shirlyn Goltz Pre-anesthesia Checklist: Patient identified, Emergency Drugs available, Suction available and Patient being monitored Patient Re-evaluated:Patient Re-evaluated prior to inductionOxygen Delivery Method: Circle system utilized Preoxygenation: Pre-oxygenation with 100% oxygen Intubation Type: IV induction Ventilation: Mask ventilation without difficulty LMA: LMA inserted LMA Size: 5.0 Number of attempts: 2 Placement Confirmation: positive ETCO2 and breath sounds checked- equal and bilateral Tube secured with: Tape Dental Injury: Teeth and Oropharynx as per pre-operative assessment

## 2016-07-31 NOTE — Anesthesia Postprocedure Evaluation (Signed)
Anesthesia Post Note  Patient: [name]  Procedure(s) Performed: Procedure(s) (LRB): A-Flutter Ablation (N/A)     Patient location during evaluation: PACU Anesthesia Type: General Level of consciousness: awake and alert Pain management: pain level controlled Vital Signs Assessment: post-procedure vital signs reviewed and stable Respiratory status: spontaneous breathing, nonlabored ventilation, respiratory function stable and patient connected to nasal cannula oxygen Cardiovascular status: blood pressure returned to baseline and stable Postop Assessment: no signs of nausea or vomiting Anesthetic complications: no    Last Vitals:  Vitals:   07/31/16 1230 07/31/16 1300  BP: (!) 150/95 (!) 149/84  Pulse: 88 85  Resp: (!) 22 13  Temp:      Last Pain:  Vitals:   07/31/16 1206  TempSrc:   PainSc: 3                  Kela Baccari DAVID

## 2016-08-01 ENCOUNTER — Encounter (HOSPITAL_COMMUNITY): Payer: Self-pay | Admitting: Internal Medicine

## 2016-08-04 ENCOUNTER — Other Ambulatory Visit: Payer: Medicaid Other

## 2016-08-05 LAB — CMP14+EGFR
A/G RATIO: 1.8 (ref 1.2–2.2)
ALK PHOS: 91 IU/L (ref 39–117)
ALT: 38 IU/L (ref 0–44)
AST: 28 IU/L (ref 0–40)
Albumin: 4.2 g/dL (ref 3.5–5.5)
BILIRUBIN TOTAL: 0.5 mg/dL (ref 0.0–1.2)
BUN/Creatinine Ratio: 12 (ref 9–20)
BUN: 12 mg/dL (ref 6–24)
CHLORIDE: 103 mmol/L (ref 96–106)
CO2: 22 mmol/L (ref 20–29)
Calcium: 8.8 mg/dL (ref 8.7–10.2)
Creatinine, Ser: 1.04 mg/dL (ref 0.76–1.27)
GFR calc Af Amer: 92 mL/min/{1.73_m2} (ref >59)
GFR, EST NON AFRICAN AMERICAN: 79 mL/min/{1.73_m2} (ref >59)
GLOBULIN, TOTAL: 2.4 g/dL (ref 1.5–4.5)
Glucose: 84 mg/dL (ref 65–99)
POTASSIUM: 4.3 mmol/L (ref 3.5–5.2)
SODIUM: 139 mmol/L (ref 134–144)
Total Protein: 6.6 g/dL (ref 6.0–8.5)

## 2016-08-05 LAB — CEA: CEA: <0.5 ng/mL

## 2016-08-07 ENCOUNTER — Other Ambulatory Visit (INDEPENDENT_AMBULATORY_CARE_PROVIDER_SITE_OTHER): Payer: Self-pay | Admitting: *Deleted

## 2016-08-07 DIAGNOSIS — Z800 Family history of malignant neoplasm of digestive organs: Secondary | ICD-10-CM

## 2016-08-27 ENCOUNTER — Encounter: Payer: Self-pay | Admitting: Internal Medicine

## 2016-08-27 ENCOUNTER — Ambulatory Visit (INDEPENDENT_AMBULATORY_CARE_PROVIDER_SITE_OTHER): Payer: Medicaid Other | Admitting: Internal Medicine

## 2016-08-27 VITALS — BP 104/82 | HR 65 | Ht 70.0 in | Wt 238.4 lb

## 2016-08-27 DIAGNOSIS — I471 Supraventricular tachycardia: Secondary | ICD-10-CM | POA: Diagnosis not present

## 2016-08-27 DIAGNOSIS — I4892 Unspecified atrial flutter: Secondary | ICD-10-CM

## 2016-08-27 DIAGNOSIS — I10 Essential (primary) hypertension: Secondary | ICD-10-CM | POA: Diagnosis not present

## 2016-08-27 NOTE — Patient Instructions (Signed)
Medication Instructions:  STOP Eliquis   Labwork: None Ordered   Testing/Procedures: None Ordered   Follow-Up: Your physician recommends that you schedule a follow-up appointment in: as needed with [name]   If you need a refill on your cardiac medications before your next appointment, please call your pharmacy.   Thank you for choosing [facility]! Christen Bame, RN [phone]

## 2016-08-27 NOTE — Progress Notes (Signed)
PCP: [name], MD Primary Cardiologist: Dr Lonia Skinner is a 56 y.o. male who presents today for routine electrophysiology followup.  Since his recent ablation, the patient reports doing very well.  he denies procedure related complications and is pleased with the results of the procedure.  Today, he denies symptoms of palpitations, chest pain, shortness of breath,  lower extremity edema, dizziness, presyncope, or syncope.  The patient is otherwise without complaint today.   Past Medical History:  Diagnosis Date  . Colon cancer ([place])   . Depression   . Hypertension   . Typical atrial flutter [facility])    Past Surgical History:  Procedure Laterality Date  . A-FLUTTER ABLATION N/A 07/31/2016   Procedure: A-Flutter Ablation;  Surgeon: [name], MD;  Location: [place] CV LAB;  Service: Cardiovascular;  Laterality: N/A;  . COLONOSCOPY N/A 04/17/2016   Procedure: COLONOSCOPY;  Surgeon: [name], MD;  [facility];  Service: Endoscopy;  Laterality: N/A;  1200  . MINOR CARPAL TUNNEL     right  . POLYPECTOMY  04/17/2016   Procedure: POLYPECTOMY;  Surgeon: [name], MD;  [facility];  Service: Endoscopy;;  sigmoid    ROS- all systems are personally reviewed and negatives except as per HPI above  Current Outpatient Prescriptions  Medication Sig Dispense Refill  . ALPRAZolam (XANAX) 1 MG tablet Take 1 mg by mouth 2 (two) times daily as needed for anxiety.     Marland Kitchen apixaban (ELIQUIS) 5 MG TABS tablet Take 1 tablet (5 mg total) by mouth 2 (two) times daily. 60 tablet 6  . calcium carbonate (TUMS - DOSED IN MG ELEMENTAL CALCIUM) 500 MG chewable tablet Chew 2-3 tablets by mouth as needed for indigestion or heartburn (Use as directed).     . Cyanocobalamin (B-12 COMPLIANCE INJECTION) 1000 MCG/ML KIT Inject 1,000 mcg as directed every 30 (thirty) days.     Marland Kitchen diltiazem (CARDIZEM) 30 MG tablet Take 30 mg by mouth every 4 (four) hours as needed  (palpitations/rapid heart rate).     . DULoxetine (CYMBALTA) 30 MG capsule Take 30 mg by mouth 2 (two) times daily.    Marland Kitchen gabapentin (NEURONTIN) 300 MG capsule Take 300 mg by mouth 3 (three) times daily as needed (foot pain).    . hydrocortisone cream 1 % Apply 1 application topically daily as needed for itching.    Marland Kitchen ketoconazole (NIZORAL) 2 % shampoo Apply 1 application topically 2 (two) times a week. (Patient taking differently: Apply 1 application topically once a week. ) 120 mL 0  . ketoconazole (NIZORAL) 2 % shampoo Apply 1 application topically once a week.    . linaclotide (LINZESS) 72 MCG capsule Take 72 mcg by mouth daily as needed (constipation).    Marland Kitchen lisinopril (PRINIVIL,ZESTRIL) 40 MG tablet Take 1 tablet (40 mg total) by mouth daily. 90 tablet 1  . pregabalin (LYRICA) 100 MG capsule Take 100 mg by mouth 2 (two) times daily as needed (pain).    . ranitidine (ZANTAC) 150 MG tablet Take 150 mg by mouth daily as needed for heartburn.     . Vitamin D, Ergocalciferol, (DRISDOL) 50000 units CAPS capsule Take 50,000 Units by mouth once a week.     . zinc gluconate 50 MG tablet Take 50 mg by mouth once a week. Take with vitamin d     Current Facility-Administered Medications  Medication Dose Route Frequency Provider Last Rate Last Dose  . cyanocobalamin ((VITAMIN  B-12)) injection 1,000 mcg  1,000 mcg Intramuscular Q30 days [name], MD   1,000 mcg at 07/01/16 1554    Physical Exam: Vitals:   08/27/16 1345  BP: 104/82  Pulse: 65  Weight: 238 lb 6.4 oz (108.1 kg)  Height: 5' 10" (1.778 m)    GEN- The patient is well appearing, alert and oriented x 3 today.   Head- normocephalic, atraumatic Eyes-  Sclera clear, conjunctiva pink Ears- hearing intact Oropharynx- clear Lungs- Clear to ausculation bilaterally, normal work of breathing Heart- Regular rate and rhythm, no murmurs, rubs or gallops, PMI not laterally displaced GI- soft, NT, ND, + BS Extremities- no clubbing,  cyanosis, or edema  EKG tracing ordered today is personally reviewed and shows sinus rhythm, normal ekg  Assessment and Plan:  1. Ectopic atrial tachycardia/ atrial flutter Doing well s/p ablation Stop anticoagulation  2. ETOH Cessation advised  3. HTN Stable No change required today  Return to see me as needed  [name] MD, [facility] 08/27/2016 2:18 PM

## 2016-08-29 ENCOUNTER — Encounter: Payer: Self-pay | Admitting: Pediatrics

## 2016-08-29 ENCOUNTER — Ambulatory Visit: Payer: Medicaid Other | Admitting: Pediatrics

## 2016-08-29 ENCOUNTER — Ambulatory Visit (INDEPENDENT_AMBULATORY_CARE_PROVIDER_SITE_OTHER): Payer: Medicaid Other | Admitting: Pediatrics

## 2016-08-29 VITALS — BP 138/89 | HR 73 | Temp 97.1°F | Ht 70.0 in | Wt 238.0 lb

## 2016-08-29 DIAGNOSIS — H6122 Impacted cerumen, left ear: Secondary | ICD-10-CM

## 2016-08-29 NOTE — Progress Notes (Signed)
  Subjective:   Patient ID: Earl [name], male    DOB: 14-Jun-1960, 57 y.o.   MRN: [id number] CC: Ear stopped up  HPI: Earl [name] is a 56 y.o. male presenting for Ear stopped up  Feels much better since the ablation, says feels like his heart is running "smoother"  L ear has felt stopped up for the last few weeks Used a qtip recently and felt even more stopped up  Some nasal drainage last few weeks  Continues to be bothered by neuropathy, takes gabapentin, limits his ability to walk for long distances Hoping to start exercising at the pool at the [facility] Eating one large meal a day, sometimes smaller snacks                                                                 Says he is not drinking any etoh      Doesn't snore, sleeping ok at night, feels well rested when he gets up in the morning  Relevant past medical, surgical, family and social history reviewed. Allergies and medications reviewed and updated. History  Smoking Status  . Former Smoker  . Quit date: 03/03/1979  Smokeless Tobacco  . Never Used   ROS: Per HPI   Objective:    BP 138/89   Pulse 73   Temp (!) 97.1 F (36.2 C) (Oral)   Ht 5\' 10"  (1.778 m)   Wt 238 lb (108 kg)   BMI 34.15 kg/m   Wt Readings from Last 3 Encounters:  08/29/16 238 lb (108 kg)  08/27/16 238 lb 6.4 oz (108.1 kg)  07/31/16 228 lb (103.4 kg)    Gen: NAD, alert, cooperative with exam, NCAT EYES: EOMI, no conjunctival injection, or no icterus ENT:  L TM with impacted cerumen, slightly injected TM L side after TMs pearly gray b/l, OP without erythema LYMPH: no cervical LAD CV: NRRR, normal S1/S2, no murmur, distal pulses 2+ b/l Resp: CTABL, no wheezes, normal WOB Abd: +BS, soft, NTND. no guarding or organomegaly Ext: No edema, warm Neuro: Alert and oriented  Assessment & Plan:  Earl [name] was seen today for ear stopped up.  Diagnoses and all orders for this visit:  Impacted cerumen of left ear Ear irrigated with removal of  cerumen and improvement in symptoms  Impacted cerumen removal: After procedure described to patient and patient agreed with proceeding, cerumen impaction was removed from L ears using irrigation.  Pt tolerated procedure well.     Follow up plan: No Follow-up on file. [name], MD [place]

## 2016-09-02 ENCOUNTER — Ambulatory Visit (INDEPENDENT_AMBULATORY_CARE_PROVIDER_SITE_OTHER): Payer: Medicaid Other | Admitting: *Deleted

## 2016-09-02 DIAGNOSIS — E538 Deficiency of other specified B group vitamins: Secondary | ICD-10-CM

## 2016-09-02 NOTE — Progress Notes (Signed)
Pt given Cyanocobalamin inj Tolerated well 

## 2016-09-07 ENCOUNTER — Encounter (HOSPITAL_COMMUNITY): Payer: Self-pay | Admitting: Emergency Medicine

## 2016-09-07 ENCOUNTER — Emergency Department (HOSPITAL_COMMUNITY)
Admission: EM | Admit: 2016-09-07 | Discharge: 2016-09-07 | Disposition: A | Discharge status: Home / Self Care | Payer: Medicaid Other | Attending: Emergency Medicine | Admitting: Emergency Medicine

## 2016-09-07 DIAGNOSIS — Z87891 Personal history of nicotine dependence: Secondary | ICD-10-CM | POA: Diagnosis not present

## 2016-09-07 DIAGNOSIS — I10 Essential (primary) hypertension: Secondary | ICD-10-CM | POA: Diagnosis not present

## 2016-09-07 DIAGNOSIS — Z79899 Other long term (current) drug therapy: Secondary | ICD-10-CM | POA: Insufficient documentation

## 2016-09-07 DIAGNOSIS — Z85038 Personal history of other malignant neoplasm of large intestine: Secondary | ICD-10-CM | POA: Insufficient documentation

## 2016-09-07 DIAGNOSIS — K0889 Other specified disorders of teeth and supporting structures: Secondary | ICD-10-CM | POA: Diagnosis present

## 2016-09-07 DIAGNOSIS — K047 Periapical abscess without sinus: Secondary | ICD-10-CM | POA: Insufficient documentation

## 2016-09-07 HISTORY — DX: Alcohol abuse, uncomplicated: F10.10

## 2016-09-07 MED ORDER — HYDROCODONE-ACETAMINOPHEN 5-325 MG PO TABS: 2.0000 | ORAL_TABLET | ORAL | 0 refills | Status: DC | PRN

## 2016-09-07 MED ORDER — HYDROCODONE-ACETAMINOPHEN 5-325 MG PO TABS
2.0000 | ORAL_TABLET | Freq: Once | ORAL | Status: AC
  Administered 2016-09-07: 2 via ORAL
  Filled 2016-09-07: qty 2

## 2016-09-07 NOTE — ED Provider Notes (Signed)
[place] DEPT Provider Note   CSN: [id number] Arrival date & time: 09/07/16  8786     History   Chief Complaint Chief Complaint  Patient presents with  . Dental Pain    HPI Earl [name] is a 56 y.o. male.  The history is provided by the patient. No language interpreter was used.  Dental Pain   This is a new problem. The problem occurs constantly. The problem has been gradually worsening. The pain is severe. The treatment provided no relief.  Pt saw dentist in [place] 2 days ago.  Pt started on clindamycin.  Pt complains of increased pain.    Past Medical History:  Diagnosis Date  . Alcohol abuse   . Colon cancer ([place])   . Depression   . Hypertension   . Typical atrial flutter [facility])     Patient Active Problem List   Diagnosis Date Noted  . Family hx of colon cancer 02/04/2016  . Alcohol use 11/30/2015  . Hematuria 11/30/2015  . Hematochezia 11/30/2015  . Special screening for malignant neoplasms, colon 09/19/2015  . Family history of colon cancer 09/19/2015  . Neuropathy 08/30/2015  . Rectal bleeding 08/30/2015  . Essential hypertension 08/30/2015  . Abnormal brain MRI 08/30/2015  . Vitamin B12 deficiency 08/30/2015  . BMI 32.0-32.9,adult 08/30/2015    Past Surgical History:  Procedure Laterality Date  . A-FLUTTER ABLATION N/A 07/31/2016   Procedure: A-Flutter Ablation;  Surgeon: [name], MD;  Location: [place] CV LAB;  Service: Cardiovascular;  Laterality: N/A;  . COLONOSCOPY N/A 04/17/2016   Procedure: COLONOSCOPY;  Surgeon: [name], MD;  [facility];  Service: Endoscopy;  Laterality: N/A;  1200  . MINOR CARPAL TUNNEL     right  . POLYPECTOMY  04/17/2016   Procedure: POLYPECTOMY;  Surgeon: [name], MD;  [facility];  Service: Endoscopy;;  sigmoid       Home Medications    Prior to Admission medications   Medication Sig Start Date End Date Taking? Authorizing Provider  ALPRAZolam Duanne Moron) 1 MG tablet  Take 1 mg by mouth 2 (two) times daily as needed for anxiety.     [provider]  calcium carbonate (TUMS - DOSED IN MG ELEMENTAL CALCIUM) 500 MG chewable tablet Chew 2-3 tablets by mouth as needed for indigestion or heartburn (Use as directed).     [provider]  Cyanocobalamin (B-12 COMPLIANCE INJECTION) 1000 MCG/ML KIT Inject 1,000 mcg as directed every 30 (thirty) days.     [provider]  diltiazem (CARDIZEM) 30 MG tablet Take 30 mg by mouth every 4 (four) hours as needed (palpitations/rapid heart rate).     [provider]  DULoxetine (CYMBALTA) 30 MG capsule Take 30 mg by mouth 2 (two) times daily.    [provider]  gabapentin (NEURONTIN) 300 MG capsule Take 300 mg by mouth 3 (three) times daily as needed (foot pain).    [provider]  HYDROcodone-acetaminophen (NORCO/VICODIN) 5-325 MG tablet Take 2 tablets by mouth every 4 (four) hours as needed. 09/07/16   Fransico Meadow, PA-C  hydrocortisone cream 1 % Apply 1 application topically daily as needed for itching.    [provider]  ketoconazole (NIZORAL) 2 % shampoo Apply 1 application topically 2 (two) times a week. Patient taking differently: Apply 1 application topically once a week.  10/29/15   [name], MD  ketoconazole (NIZORAL) 2 % shampoo Apply 1 application topically once a week.  [provider]  linaclotide (LINZESS) 72 MCG capsule Take 72 mcg by mouth daily as needed (constipation).    [provider]  lisinopril (PRINIVIL,ZESTRIL) 40 MG tablet Take 1 tablet (40 mg total) by mouth daily. 07/01/16   [name], MD  pregabalin (LYRICA) 100 MG capsule Take 100 mg by mouth 2 (two) times daily as needed (pain).    [provider]  ranitidine (ZANTAC) 150 MG tablet Take 150 mg by mouth daily as needed for heartburn.     [provider]  Vitamin D, Ergocalciferol, (DRISDOL) 50000 units CAPS capsule Take 50,000 Units by  mouth once a week.     [provider]  zinc gluconate 50 MG tablet Take 50 mg by mouth once a week. Take with vitamin d    [provider]    Family History Family History  Problem Relation Age of Onset  . Heart disease Father   . Coronary artery disease Father   . Coronary artery disease Paternal Grandfather     Social History Social History  Substance Use Topics  . Smoking status: Former Smoker    Quit date: 03/03/1979  . Smokeless tobacco: Never Used  . Alcohol use 4.8 oz/week    8 Cans of beer per week     Comment: drinks heavily at times     Allergies   Amitriptyline; Amoxicillin; Asa [aspirin]; Clonidine derivatives; and Hydrochlorothiazide   Review of Systems Review of Systems  All other systems reviewed and are negative.    Physical Exam Updated Vital Signs BP (!) 154/114   Pulse 60   Temp 97.6 F (36.4 C) (Axillary)   Resp 18   Ht 5' 10"  (1.778 m)   Wt 108 kg (238 lb)   SpO2 100%   BMI 34.15 kg/m   Physical Exam  Constitutional: He appears well-developed and well-nourished.  HENT:  Head: Normocephalic.  Large cavity right lower mouth  Swelling around gum.   Eyes: Pupils are equal, round, and reactive to light.  Pulmonary/Chest: Effort normal.  Musculoskeletal: Normal range of motion.  Neurological: He is alert.  Skin: Skin is warm.  Nursing note and vitals reviewed.    ED Treatments / Results  Labs (all labs ordered are listed, but only abnormal results are displayed) Labs Reviewed - No data to display  EKG  EKG Interpretation None       Radiology No results found.  Procedures Procedures (including critical care time)  Medications Ordered in ED Medications  HYDROcodone-acetaminophen (NORCO/VICODIN) 5-325 MG per tablet 2 tablet (not administered)     Initial Impression / Assessment and Plan / ED Course  I have reviewed the triage vital signs and the nursing notes.  Pertinent labs & imaging results that  were available during my care of the patient were reviewed by me and considered in my medical decision making (see chart for details).     Pt and father want something done acutely.  I advised pt he will need to call his dentist on Monday.  He does not need an I and D. Currently.  P tgiven 2 hydrocodone her RX  Meds ordered this encounter  Medications  . HYDROcodone-acetaminophen (NORCO/VICODIN) 5-325 MG per tablet 2 tablet  . HYDROcodone-acetaminophen (NORCO/VICODIN) 5-325 MG tablet    Sig: Take 2 tablets by mouth every 4 (four) hours as needed.    Dispense:  10 tablet    Refill:  0    Order Specific Question:   Supervising Provider  Answer:   MILLER, [place]  .    Final Clinical Impressions(s) / ED Diagnoses   Final diagnoses:  Dental abscess    New Prescriptions New Prescriptions   HYDROCODONE-ACETAMINOPHEN (NORCO/VICODIN) 5-325 MG TABLET    Take 2 tablets by mouth every 4 (four) hours as needed.  An After Visit Summary was printed and given to the patient.    Fransico Meadow, PA-C 09/07/16 [place], [place], DO 09/10/16 1544

## 2016-09-07 NOTE — ED Triage Notes (Signed)
Pt c/o right lower toothache worsening over night. Pt states he has been on antibiotics and has an appointment with a dentist.

## 2016-09-07 NOTE — Discharge Instructions (Signed)
Call your Dentist tomorrow.  Continue clindamycin.

## 2016-09-09 ENCOUNTER — Emergency Department (HOSPITAL_COMMUNITY)
Admission: EM | Admit: 2016-09-09 | Discharge: 2016-09-09 | Disposition: A | Discharge status: Home / Self Care | Payer: Medicaid Other | Attending: Emergency Medicine | Admitting: Emergency Medicine

## 2016-09-09 ENCOUNTER — Encounter (HOSPITAL_COMMUNITY): Payer: Self-pay

## 2016-09-09 ENCOUNTER — Telehealth: Payer: Self-pay | Admitting: Internal Medicine

## 2016-09-09 ENCOUNTER — Ambulatory Visit: Payer: Medicaid Other | Admitting: Family Medicine

## 2016-09-09 DIAGNOSIS — L089 Local infection of the skin and subcutaneous tissue, unspecified: Secondary | ICD-10-CM | POA: Diagnosis not present

## 2016-09-09 DIAGNOSIS — Z87891 Personal history of nicotine dependence: Secondary | ICD-10-CM | POA: Diagnosis not present

## 2016-09-09 DIAGNOSIS — Z79899 Other long term (current) drug therapy: Secondary | ICD-10-CM | POA: Diagnosis not present

## 2016-09-09 DIAGNOSIS — I10 Essential (primary) hypertension: Secondary | ICD-10-CM | POA: Insufficient documentation

## 2016-09-09 DIAGNOSIS — L539 Erythematous condition, unspecified: Secondary | ICD-10-CM | POA: Diagnosis present

## 2016-09-09 DIAGNOSIS — Z85038 Personal history of other malignant neoplasm of large intestine: Secondary | ICD-10-CM | POA: Insufficient documentation

## 2016-09-09 MED ORDER — SULFAMETHOXAZOLE-TRIMETHOPRIM 800-160 MG PO TABS: 1.0000 | ORAL_TABLET | Freq: Two times a day (BID) | ORAL | 0 refills | Status: AC

## 2016-09-09 MED ORDER — SULFAMETHOXAZOLE-TRIMETHOPRIM 800-160 MG PO TABS
1.0000 | ORAL_TABLET | Freq: Once | ORAL | Status: AC
  Administered 2016-09-09: 1 via ORAL
  Filled 2016-09-09: qty 1

## 2016-09-09 NOTE — Discharge Instructions (Signed)
As discussed, Follow up with your primary care provider for recheck in 3 days. Take bactrim for the next 7 days even if it gets better.  Return if symptoms worsen, fever, chills, pain, swelling or other concerning symptoms in the meantime.

## 2016-09-09 NOTE — ED Triage Notes (Signed)
Per Pt, Pt is coming from home with complaints of erythema noted to the left thumb that started right after his Atrial Flutter in July. Pt reports decreasing with Bactrim and returning. Hx of MRSA.

## 2016-09-09 NOTE — ED Provider Notes (Signed)
[facility] DEPT Provider Note   CSN: [id number] Arrival date & time: 09/09/16  1626  By signing my name below, I, Margit Banda, attest that this documentation has been prepared under the direction and in the presence of Avie Echevaria, PA-C. Electronically Signed: Margit Banda, ED Scribe. 09/09/16. 7:45 PM.  History   Chief Complaint Chief Complaint  Patient presents with  . Hand INfection    HPI [name] is a 56 y.o. male with a PMHx of MRSA, who presents to the Emergency Department complaining of gradually redness to his left thumb that started ~ 2 days. Associated sx include mild swelling. States like it feels like a sun burn. Pt reports he saw a dentist for a dental abscess and was given bactrim which was improving the redness to his thumb. After taking bactrim for 7 days he saw another dentist who switched him to clindamycin which he has been taking for the last 3 days. Pt states the redness to his thumb has returned and has become worsen than before. No known injuries. Pt denies fever, chills, nausea, vomiting, pain, or any other complaints at this time.   The history is provided by the patient. No language interpreter was used.    Past Medical History:  Diagnosis Date  . Alcohol abuse   . Colon cancer ([place])   . Depression   . Hypertension   . Typical atrial flutter [facility])     Patient Active Problem List   Diagnosis Date Noted  . Family hx of colon cancer 02/04/2016  . Alcohol use 11/30/2015  . Hematuria 11/30/2015  . Hematochezia 11/30/2015  . Special screening for malignant neoplasms, colon 09/19/2015  . Family history of colon cancer 09/19/2015  . Neuropathy 08/30/2015  . Rectal bleeding 08/30/2015  . Essential hypertension 08/30/2015  . Abnormal brain MRI 08/30/2015  . Vitamin B12 deficiency 08/30/2015  . BMI 32.0-32.9,adult 08/30/2015    Past Surgical History:  Procedure Laterality Date  . A-FLUTTER ABLATION N/A 07/31/2016   Procedure:  A-Flutter Ablation;  Surgeon: [name], MD;  Location: [name];  Service: Cardiovascular;  Laterality: N/A;  . COLONOSCOPY N/A 04/17/2016   Procedure: COLONOSCOPY;  Surgeon: [name], MD;  Location: [facility];  Service: Endoscopy;  Laterality: N/A;  1200  . MINOR CARPAL TUNNEL     right  . POLYPECTOMY  04/17/2016   Procedure: POLYPECTOMY;  Surgeon: [name], MD;  Location: [facility]O SUITE;  Service: Endoscopy;;  sigmoid       Home Medications    Prior to Admission medications   Medication Sig Start Date End Date Taking? Authorizing Provider  ALPRAZolam Duanne Moron) 1 MG tablet Take 1 mg by mouth 2 (two) times daily as needed for anxiety.     [provider]  calcium carbonate (TUMS - DOSED IN MG ELEMENTAL CALCIUM) 500 MG chewable tablet Chew 2-3 tablets by mouth as needed for indigestion or heartburn (Use as directed).     [provider]  Cyanocobalamin (B-12 COMPLIANCE INJECTION) 1000 MCG/ML KIT Inject 1,000 mcg as directed every 30 (thirty) days.     [provider]  diltiazem (CARDIZEM) 30 MG tablet Take 30 mg by mouth every 4 (four) hours as needed (palpitations/rapid heart rate).     [provider]  DULoxetine (CYMBALTA) 30 MG capsule Take 30 mg by mouth 2 (two) times daily.    [provider]  gabapentin (NEURONTIN) 300 MG capsule Take 300 mg by mouth 3 (three) times  daily as needed (foot pain).    [provider]  HYDROcodone-acetaminophen (NORCO/VICODIN) 5-325 MG tablet Take 2 tablets by mouth every 4 (four) hours as needed. 09/07/16   Fransico Meadow, PA-C  hydrocortisone cream 1 % Apply 1 application topically daily as needed for itching.    [provider]  ketoconazole (NIZORAL) 2 % shampoo Apply 1 application topically 2 (two) times a week. Patient taking differently: Apply 1 application topically once a week.  10/29/15   [name], MD  ketoconazole (NIZORAL) 2 % shampoo Apply 1  application topically once a week.    [provider]  linaclotide (LINZESS) 72 MCG capsule Take 72 mcg by mouth daily as needed (constipation).    [provider]  lisinopril (PRINIVIL,ZESTRIL) 40 MG tablet Take 1 tablet (40 mg total) by mouth daily. 07/01/16   [name], MD  pregabalin (LYRICA) 100 MG capsule Take 100 mg by mouth 2 (two) times daily as needed (pain).    [provider]  ranitidine (ZANTAC) 150 MG tablet Take 150 mg by mouth daily as needed for heartburn.     [provider]  sulfamethoxazole-trimethoprim (BACTRIM DS,SEPTRA DS) 800-160 MG tablet Take 1 tablet by mouth 2 (two) times daily. 09/09/16 09/16/16  Avie Echevaria B, PA-C  Vitamin D, Ergocalciferol, (DRISDOL) 50000 units CAPS capsule Take 50,000 Units by mouth once a week.     [provider]  zinc gluconate 50 MG tablet Take 50 mg by mouth once a week. Take with vitamin d    [provider]    Family History Family History  Problem Relation Age of Onset  . Heart disease Father   . Coronary artery disease Father   . Coronary artery disease Paternal Grandfather     Social History Social History  Substance Use Topics  . Smoking status: Former Smoker    Quit date: 03/03/1979  . Smokeless tobacco: Never Used  . Alcohol use 4.8 oz/week    8 Cans of beer per week     Comment: drinks heavily at times     Allergies   Amitriptyline; Amoxicillin; Asa [aspirin]; Clonidine derivatives; and Hydrochlorothiazide   Review of Systems Review of Systems  Constitutional: Negative for chills and fever.  Gastrointestinal: Negative for nausea and vomiting.  Musculoskeletal: Negative for arthralgias and joint swelling.  Skin: Positive for color change. Negative for pallor and wound.  Neurological: Negative for weakness and numbness.     Physical Exam Updated Vital Signs BP (!) 155/85 (BP Location: Right Arm)   Pulse 90   Temp 98.5 F (36.9 C) (Oral)   Resp  16   Ht 5' 10"  (1.778 m)   Wt 108 kg (238 lb)   SpO2 96%   BMI 34.15 kg/m   Physical Exam  Constitutional: He appears well-developed and well-nourished. No distress.  Patient is afebrile, non-toxic appearing, sitting comfortably in chair in no acute distress.  HENT:  Head: Normocephalic and atraumatic.  Eyes: Conjunctivae are normal.  Neck: Normal range of motion.  Cardiovascular: Normal rate, regular rhythm, normal heart sounds and intact distal pulses.   Pulmonary/Chest: Effort normal and breath sounds normal. No respiratory distress. He has no wheezes. He has no rales.  Abdominal: He exhibits no distension.  Musculoskeletal: Normal range of motion. He exhibits no edema, tenderness or deformity.  Erythema to his left thumb over the DIP to the dorsum of the hand. No tenderness or warmth. FROM.  Neurological: He is alert. No sensory  deficit.  Skin: Skin is warm and dry. Capillary refill takes less than 2 seconds. No rash noted. He is not diaphoretic. There is erythema. No pallor.  Psychiatric: He has a normal mood and affect. His behavior is normal.  Nursing note and vitals reviewed.    ED Treatments / Results  DIAGNOSTIC STUDIES: Oxygen Saturation is 96% on RA, adequate by my interpretation.   COORDINATION OF CARE: 7:45 PM-Discussed next steps with pt. Pt verbalized understanding and is agreeable with the plan.   Labs (all labs ordered are listed, but only abnormal results are displayed) Labs Reviewed - No data to display  EKG  EKG Interpretation None       Radiology No results found.  Procedures Procedures (including critical care time)  Medications Ordered in ED Medications  sulfamethoxazole-trimethoprim (BACTRIM DS,SEPTRA DS) 800-160 MG per tablet 1 tablet (not administered)     Initial Impression / Assessment and Plan / ED Course  I have reviewed the triage vital signs and the nursing notes.  Pertinent labs & imaging results that were available during  my care of the patient were reviewed by me and considered in my medical decision making (see chart for details).     Patient presents with erythema to the left thumb (1.5 x 1.5 cm). No arthralgia or swelling, warmth.  Patient reported improvement with bactrim and recurrence after discontinuation and starting with clindamycin. He describes the sensation as comparable to a sunburn. No pain with movement. Patient is afebrile, nontoxic-appearing. Brisk cap refill and sensation intact.  Patient was discussed with Dr. Dayna Barker who recommends starting bactrim in addition to clindamycin and have it rechecked in 3 days.  Discharge home with bactrim and close follow up. . Discussed strict return precautions and advised to return to the emergency department if experiencing any new or worsening symptoms. Instructions were understood and patient agreed with discharge plan.  Final Clinical Impressions(s) / ED Diagnoses   Final diagnoses:  Superficial skin infection    New Prescriptions New Prescriptions   SULFAMETHOXAZOLE-TRIMETHOPRIM (BACTRIM DS,SEPTRA DS) 800-160 MG TABLET    Take 1 tablet by mouth 2 (two) times daily.   I personally performed the services described in this documentation, which was scribed in my presence. The recorded information has been reviewed and is accurate.     Emeline General, PA-C 09/09/16 2050    Merrily Pew, MD 09/09/16 [phone]

## 2016-09-09 NOTE — Telephone Encounter (Signed)
New Message  Pt call requesting to speak with RN. Pt states after his procedure with [name] he thumb looked and felt like it was burnt. Pt states he's not sure what exactly what it was. Pt states it went away, but has now come back and he thinks it could possibly be mercer. Pt states he has had mercer in the past and would like to speak wit some on as soon as possible. Please call back to discuss

## 2016-09-09 NOTE — Telephone Encounter (Signed)
Returned call to patient and have asked that he call his PCP to take a look at his thumb.  He will call them.

## 2016-09-11 ENCOUNTER — Ambulatory Visit (INDEPENDENT_AMBULATORY_CARE_PROVIDER_SITE_OTHER): Payer: Medicaid Other | Admitting: Pediatrics

## 2016-09-11 ENCOUNTER — Encounter: Payer: Self-pay | Admitting: Pediatrics

## 2016-09-11 ENCOUNTER — Encounter (INDEPENDENT_AMBULATORY_CARE_PROVIDER_SITE_OTHER): Payer: Self-pay

## 2016-09-11 VITALS — BP 139/87 | HR 79 | Temp 97.5°F | Ht 70.0 in | Wt 242.0 lb

## 2016-09-11 DIAGNOSIS — L03012 Cellulitis of left finger: Secondary | ICD-10-CM | POA: Diagnosis not present

## 2016-09-11 NOTE — Progress Notes (Signed)
  Subjective:   Patient ID: [name], male    DOB: 03/20/1960, 56 y.o.   MRN: [id number] CC: Follow-up (Left thumb, MRSA)  HPI: [name] is a 56 y.o. male presenting for Follow-up (Left thumb, MRSA)  Seen in ED Has had red swollen L thumb IP joint No pain Was treated with bactrim, switched to clindamycin and had worsening redness so in ED was switched back to bactrim Pt says has been improving since then No drainage Thumb joint feels slightly tight, numb over redness, normal feeling in palmar surface of thumb and tip of thumb Normal thumb nail No cuts or scrapes to thumb  Constipation ongoing Has to take linzess for stooling, has multiple loose stools after that, limits his going out that day however  Relevant past medical, surgical, family and social history reviewed. Allergies and medications reviewed and updated. History  Smoking Status  . Former Smoker  . Quit date: 03/03/1979  Smokeless Tobacco  . Never Used   ROS: Per HPI   Objective:    BP 139/87   Pulse 79   Temp (!) 97.5 F (36.4 C) (Oral)   Ht 5\' 10"  (1.778 m)   Wt 242 lb (109.8 kg)   BMI 34.72 kg/m   Wt Readings from Last 3 Encounters:  09/11/16 242 lb (109.8 kg)  09/09/16 238 lb (108 kg)  09/07/16 238 lb (108 kg)    Gen: NAD, alert, cooperative with exam, NCAT EYES: EOMI, no conjunctival injection, or no icterus CV: NRRR, normal S1/S2, no murmur, distal pulses 2+ b/l Resp: CTABL, no wheezes, normal WOB Ext: No edema LE Neuro: Alert and oriented, decreased sensation over IP joint L thumb to monofilament and light touch, normal sensation otherwise on L hand MSK: slightly decreased flexion L thumb compared with R in IP joint Slightly red and slightly swollen L IP joint L side compared to R side  Assessment & Plan:  [name] was seen today for follow-up med problems  Diagnoses and all orders for this visit:  Cellulitis of finger of left hand Improving, cont abx  Constipation  Side  effects from OTC meds including discomfort or they didn't work linzess lowest dose helps, sometimes too much Cont to increase fiber in diet, drink plenty of fluids Will cont linzess for now prn   Follow up plan: No Follow-up on file. [name], MD [place]

## 2016-09-24 ENCOUNTER — Encounter: Payer: Self-pay | Admitting: Family Medicine

## 2016-09-24 ENCOUNTER — Ambulatory Visit (INDEPENDENT_AMBULATORY_CARE_PROVIDER_SITE_OTHER): Payer: Medicaid Other | Admitting: Family Medicine

## 2016-09-24 VITALS — BP 115/78 | HR 58 | Temp 97.8°F | Ht 70.0 in | Wt 243.0 lb

## 2016-09-24 DIAGNOSIS — L03019 Cellulitis of unspecified finger: Secondary | ICD-10-CM

## 2016-09-24 MED ORDER — DOXYCYCLINE HYCLATE 100 MG PO TABS: 100.0000 mg | ORAL_TABLET | Freq: Two times a day (BID) | ORAL | 0 refills | Status: DC

## 2016-09-24 NOTE — Progress Notes (Signed)
Chief Complaint  Patient presents with  . Cellulitis    pt here today c/o ?infection of left thumb    HPI  Patient presents today for Sudden onset overnight of redness and swelling in the left thumb. Patient says this is the third time it's happened recently. He took sulfa for just a couple of weeks ago. He is concerned about MRSA. In the ER recently they thought it might be. However he denies any vesicle formation blistering pus etc. He denies any pain except that it feels like a sunburn but it isn't a painful sunburn. He denies fever chills and sweats.  PMH: Smoking status noted ROS: Per HPI  Objective: BP 115/78   Pulse (!) 58   Temp 97.8 F (36.6 C) (Oral)   Ht 5\' 10"  (1.778 m)   Wt 243 lb (110.2 kg)   BMI 34.87 kg/m  Gen: NAD, alert, cooperative with exam HEENT: NCAT, EOMI, PERRL Skin: There is a 3 cm erythema on the dorsum of the left thumb. Photo below shows comparison to the right thumb. Mild edema noted as well.    Ext: No edema, warm Neuro: Alert and oriented, No gross deficits  Assessment and plan:  1. Cellulitis of finger, unspecified laterality     Meds ordered this encounter  Medications  . doxycycline (VIBRA-TABS) 100 MG tablet    Sig: Take 1 tablet (100 mg total) by mouth 2 (two) times daily.    Dispense:  20 tablet    Refill:  0    Orders Placed This Encounter  Procedures  . Anaerobic and Aerobic Culture  . Ambulatory referral to Infectious Disease    Referral Priority:   Routine    Referral Type:   Consultation    Referral Reason:   Specialty Services Required    Requested Specialty:   Infectious Diseases    Number of Visits Requested:   1    Follow up as needed.  Claretta Fraise, MD

## 2016-09-26 ENCOUNTER — Telehealth: Payer: Self-pay | Admitting: Family Medicine

## 2016-09-26 NOTE — Telephone Encounter (Signed)
Pt aware we don't have final report back yet and will call back with results as soon as we get them.

## 2016-09-28 LAB — ANAEROBIC AND AEROBIC CULTURE

## 2016-10-01 ENCOUNTER — Encounter: Payer: Self-pay | Admitting: Internal Medicine

## 2016-10-01 ENCOUNTER — Ambulatory Visit (INDEPENDENT_AMBULATORY_CARE_PROVIDER_SITE_OTHER): Payer: Medicaid Other | Admitting: Internal Medicine

## 2016-10-01 DIAGNOSIS — M7989 Other specified soft tissue disorders: Secondary | ICD-10-CM | POA: Diagnosis not present

## 2016-10-01 NOTE — Progress Notes (Signed)
[facility] for Infectious Disease      Reason for Consult: possible cellulitis of thumb    Referring Physician: Dr. Livia Snellen    Patient ID: [name], male    DOB: 03/14/1960, 56 y.o.   MRN: [id number]  HPI:   Here for concern for thumb infection.  This has occurred 3 or 4 times since early July after his recent cardiac ablation for aflutter.  He describes this as swelling with pealing after the swelling that goes away on its own.  He has taken multiple courses of antibiotics including Bactrim, clindamycin and doxyxcycline, none of which worked.  It does not get painful or tender, is not warm and his flexibility remains largely intact.  It does just resolve on its own.  Not associated with fever or chills.  The distribution of the erythema is confluent and in a stocking glove like distribution with straight borders.  Thumb crush injury as a child.  Worked as a Furniture conservator/restorer.  Previous record reviewed pcp and thumb erythema noted in recent picture.   Past Medical History:  Diagnosis Date  . Alcohol abuse   . Colon cancer ([place])   . Depression   . Hypertension   . Typical atrial flutter ([place])     Prior to Admission medications   Medication Sig Start Date End Date Taking? Authorizing Provider  ALPRAZolam Duanne Moron) 1 MG tablet Take 1 mg by mouth 2 (two) times daily as needed for anxiety.    Yes [provider]  calcium carbonate (TUMS - DOSED IN MG ELEMENTAL CALCIUM) 500 MG chewable tablet Chew 2-3 tablets by mouth as needed for indigestion or heartburn (Use as directed).    Yes [provider]  Cyanocobalamin (B-12 COMPLIANCE INJECTION) 1000 MCG/ML KIT Inject 1,000 mcg as directed every 30 (thirty) days.    Yes [provider]  doxycycline (VIBRA-TABS) 100 MG tablet Take 1 tablet (100 mg total) by mouth 2 (two) times daily. 09/24/16  Yes Claretta Fraise, MD  DULoxetine (CYMBALTA) 30 MG capsule Take 30 mg by mouth 2 (two) times daily.   Yes [provider]  gabapentin (NEURONTIN) 300 MG capsule Take 300 mg by mouth 3 (three) times daily as needed (foot pain).   Yes [provider]  hydrocortisone cream 1 % Apply 1 application topically daily as needed for itching.   Yes [provider]  ketoconazole (NIZORAL) 2 % shampoo Apply 1 application topically once a week.   Yes [provider]  linaclotide (LINZESS) 72 MCG capsule Take 72 mcg by mouth daily as needed (constipation).   Yes [provider]  lisinopril (PRINIVIL,ZESTRIL) 40 MG tablet Take 1 tablet (40 mg total) by mouth daily. 07/01/16  Yes [name], MD  pregabalin (LYRICA) 100 MG capsule Take 100 mg by mouth 2 (two) times daily as needed (pain).   Yes [provider]  ranitidine (ZANTAC) 150 MG tablet Take 150 mg by mouth daily as needed for heartburn.    Yes [provider]  Vitamin D, Ergocalciferol, (DRISDOL) 50000 units CAPS capsule Take 50,000 Units by mouth once a week.    Yes [provider]  zinc gluconate 50 MG tablet Take 50 mg by mouth once a week. Take with vitamin d   Yes [provider]  diltiazem (CARDIZEM) 30 MG tablet Take 30 mg by mouth every 4 (four) hours as needed (palpitations/rapid heart rate).     [provider]    Allergies  Allergen  Reactions  . Amitriptyline Anaphylaxis  . Amoxicillin Anaphylaxis and Rash    Has patient had a PCN reaction causing immediate rash, facial/tongue/throat swelling, SOB or lightheadedness with hypotension: Yes Has patient had a PCN reaction causing severe rash involving mucus membranes or skin necrosis: No Has patient had a PCN reaction that required hospitalization: No Has patient had a PCN reaction occurring within the last 10 years: Yes If all of the above answers are "NO", then may proceed with Cephalosporin use.   Diona Fanti [Aspirin] Anaphylaxis  . Clonidine Derivatives Other (See Comments)    Severe Dry Mouth  . Hydrochlorothiazide      Caused rectal bleeding and hematuria    Social History  Substance Use Topics  . Smoking status: Former Smoker    Quit date: 03/03/1979  . Smokeless tobacco: Never Used  . Alcohol use No     Comment: drinks heavily at times    Family History  Problem Relation Age of Onset  . Heart disease Father   . Coronary artery disease Father   . Coronary artery disease Paternal Grandfather     Review of Systems  Constitutional: negative for fevers and chills Gastrointestinal: negative for nausea and diarrhea Integument/breast: negative for rash All other systems reviewed and are negative    Constitutional: in no apparent distress and alert  Vitals:   10/01/16 1529  BP: (!) 136/94  Pulse: 64   EYES: anicteric ENMT: no thrush Cardiovascular: Cor RRR Respiratory: CTA B'; normal respiratory effort GI: Bowel sounds are normal, liver is not enlarged, spleen is not enlarged Musculoskeletal: no pedal edema noted; left thumb now without erythema, slight swelling compared to right thumb Skin: negatives: no rash  Labs: Lab Results  Component Value Date   WBC 6.2 07/23/2016   HGB 14.9 07/23/2016   HCT 43.0 07/23/2016   MCV 89 07/23/2016   PLT 201 07/23/2016    Lab Results  Component Value Date   CREATININE 1.04 08/04/2016   BUN 12 08/04/2016   NA 139 08/04/2016   K 4.3 08/04/2016   CL 103 08/04/2016   CO2 22 08/04/2016    Lab Results  Component Value Date   ALT 38 08/04/2016   AST 28 08/04/2016   ALKPHOS 91 08/04/2016   BILITOT 0.5 08/04/2016     Assessment: Thumb erythema.  I am unsure of what is causing this.  I doubt this is an infection without the warmth or pain associated with cellulitis and that it is recurrent and in an even distribution.  I would suspect more an issue due to his previous injury though without pain associated with this it seems less likely related to his carpel tunnel, de Quervain's or other similar issue.    Plan: 1) I will refer to a hand  specialist/orthopedist for diagnosis or if any work up indicated.     2) no antibiotics indicated

## 2016-10-02 ENCOUNTER — Ambulatory Visit (INDEPENDENT_AMBULATORY_CARE_PROVIDER_SITE_OTHER): Payer: Medicaid Other | Admitting: Pediatrics

## 2016-10-02 ENCOUNTER — Encounter: Payer: Self-pay | Admitting: Pediatrics

## 2016-10-02 VITALS — BP 98/72 | HR 69 | Temp 98.9°F | Ht 68.0 in | Wt 239.6 lb

## 2016-10-02 DIAGNOSIS — M7989 Other specified soft tissue disorders: Secondary | ICD-10-CM

## 2016-10-02 DIAGNOSIS — E538 Deficiency of other specified B group vitamins: Secondary | ICD-10-CM

## 2016-10-02 DIAGNOSIS — G629 Polyneuropathy, unspecified: Secondary | ICD-10-CM | POA: Diagnosis not present

## 2016-10-02 DIAGNOSIS — F419 Anxiety disorder, unspecified: Secondary | ICD-10-CM | POA: Diagnosis not present

## 2016-10-02 DIAGNOSIS — I10 Essential (primary) hypertension: Secondary | ICD-10-CM
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
## 2016-10-03 LAB — VITAMIN D 25 HYDROXY (VIT D DEFICIENCY, FRACTURES): Vit D, 25-Hydroxy: 56.2 ng/mL (ref 30.0–100.0)

## 2016-10-03 LAB — FOLATE: Folate: 8.3 ng/mL (ref >3.0)

## 2016-10-03 LAB — TSH: TSH: 4.57 u[IU]/mL — ABNORMAL HIGH (ref 0.450–4.500)

## 2016-10-03 LAB — VITAMIN B12

## 2016-10-08 ENCOUNTER — Telehealth: Payer: Self-pay | Admitting: Pediatrics

## 2016-10-10 NOTE — Telephone Encounter (Signed)
Result note in 

## 2016-10-10 NOTE — Telephone Encounter (Signed)
Patient aware of lab results.

## 2016-10-23 ENCOUNTER — Other Ambulatory Visit (INDEPENDENT_AMBULATORY_CARE_PROVIDER_SITE_OTHER): Payer: Self-pay | Admitting: *Deleted

## 2016-10-23 ENCOUNTER — Encounter (INDEPENDENT_AMBULATORY_CARE_PROVIDER_SITE_OTHER): Payer: Self-pay | Admitting: *Deleted

## 2016-10-23 DIAGNOSIS — Z800 Family history of malignant neoplasm of digestive organs: Secondary | ICD-10-CM

## 2016-11-06 LAB — CEA: CEA: <0.5 ng/mL

## 2016-11-10 ENCOUNTER — Other Ambulatory Visit (INDEPENDENT_AMBULATORY_CARE_PROVIDER_SITE_OTHER): Payer: Self-pay | Admitting: *Deleted

## 2016-11-10 DIAGNOSIS — Z800 Family history of malignant neoplasm of digestive organs: Secondary | ICD-10-CM

## 2016-12-22 ENCOUNTER — Encounter (INDEPENDENT_AMBULATORY_CARE_PROVIDER_SITE_OTHER): Payer: Self-pay | Admitting: *Deleted

## 2016-12-22 ENCOUNTER — Other Ambulatory Visit (INDEPENDENT_AMBULATORY_CARE_PROVIDER_SITE_OTHER): Payer: Self-pay | Admitting: *Deleted

## 2016-12-22 DIAGNOSIS — Z800 Family history of malignant neoplasm of digestive organs: Secondary | ICD-10-CM

## 2017-01-02 ENCOUNTER — Other Ambulatory Visit: Payer: Self-pay | Admitting: Pediatrics

## 2017-01-02 DIAGNOSIS — I10 Essential (primary) hypertension: Secondary | ICD-10-CM

## 2017-01-08 LAB — CEA: CEA: <0.5 ng/mL

## 2017-01-14 ENCOUNTER — Other Ambulatory Visit (INDEPENDENT_AMBULATORY_CARE_PROVIDER_SITE_OTHER): Payer: Self-pay | Admitting: *Deleted

## 2017-02-09 ENCOUNTER — Telehealth: Payer: Self-pay | Admitting: Pediatrics

## 2017-02-09 NOTE — Telephone Encounter (Signed)
NTBS __  Made appt to discuss all

## 2017-02-10 ENCOUNTER — Ambulatory Visit: Payer: Medicaid Other | Admitting: Pediatrics

## 2017-02-10 ENCOUNTER — Other Ambulatory Visit: Payer: Self-pay

## 2017-02-10 ENCOUNTER — Encounter: Payer: Self-pay | Admitting: Pediatrics

## 2017-02-10 VITALS — BP 145/87 | HR 65 | Temp 96.9°F | Ht 68.0 in | Wt 254.0 lb

## 2017-02-10 DIAGNOSIS — I10 Essential (primary) hypertension: Secondary | ICD-10-CM

## 2017-02-10 DIAGNOSIS — E785 Hyperlipidemia, unspecified: Secondary | ICD-10-CM | POA: Diagnosis not present

## 2017-02-10 DIAGNOSIS — E039 Hypothyroidism, unspecified: Secondary | ICD-10-CM

## 2017-02-10 MED ORDER — AMLODIPINE BESYLATE 5 MG PO TABS: 5.0000 mg | ORAL_TABLET | Freq: Every day | ORAL | 3 refills | Status: DC

## 2017-02-10 NOTE — Progress Notes (Signed)
  Subjective:   Patient ID: Earl [name], male    DOB: Dec 17, 1960, 57 y.o.   MRN: [id number] CC: Weight Gain and Hypertension  HPI: [name] is a 57 y.o. male presenting for Weight Gain and Hypertension  Quit drinking alcohol and has had weight since then  Has tried topamax for weight loss, caused memory loss  HTN: BPs 150s/100s at home yesterday No CP, SOB Wants to exercise more regularly Was walking 2 hrs/day a year ago Not walking regularly he says due to neuropathy in his feet  Typically eats one meal a day, heavy in potatoes, ramen noodles, says he decreases salt he puts in  Thyroid level: borderline hypothyroid in the past, repeat now  Relevant past medical, surgical, family and social history reviewed. Allergies and medications reviewed and updated. Social History   Tobacco Use  Smoking Status Former Smoker  . Last attempt to quit: 03/03/1979  . Years since quitting: 37.9  Smokeless Tobacco Never Used   ROS: Per HPI   Objective:    BP (!) 145/87   Pulse 65   Temp (!) 96.9 F (36.1 C) (Oral)   Ht _0  (1.727 m)   Wt 254 lb (115.2 kg)   BMI 38.62 kg/m   Wt Readings from Last 3 Encounters:  02/10/17 254 lb (115.2 kg)  10/02/16 239 lb 9.6 oz (108.7 kg)  10/01/16 232 lb (105.2 kg)    Gen: NAD, alert, cooperative with exam, NCAT EYES: EOMI, no conjunctival injection, or no icterus ENT:   OP without erythema LYMPH: no cervical LAD CV: NRRR, normal S1/S2, no murmur, distal pulses 2+ b/l Resp: CTABL, no wheezes, normal WOB Abd: +BS, soft, NTND. no guarding or organomegaly Ext: No edema, warm Neuro: Alert and oriented, strength equal b/l UE and LE, coordination grossly normal MSK: normal muscle bulk  Assessment & Plan:  [name] was seen today for weight gain and hypertension.  Diagnoses and all orders for this visit:  Essential hypertension Cont lisinopril, start below DASH diet -     amLODipine (NORVASC) 5 MG tablet; Take 1 tablet (5 mg total)  by mouth daily. -     BMP8+EGFR  Hyperlipidemia, unspecified hyperlipidemia type Check lipids -     Lipid panel  Hypothyroidism, unspecified type TSH 4.57 last check -     TSH  Elevated BMI Walk daily, decrease sugar/carb intake  Follow up plan: Return in about 4 weeks (around 03/10/2017). [name], MD [place]

## 2017-02-10 NOTE — Patient Instructions (Addendum)
DASH Eating Plan DASH stands for "Dietary Approaches to Stop Hypertension." The DASH eating plan is a healthy eating plan that has been shown to reduce high blood pressure (hypertension). It may also reduce your risk for type 2 diabetes, heart disease, and stroke. The DASH eating plan may also help with weight loss. What are tips for following this plan? General guidelines  Avoid eating more than 2,300 mg (milligrams) of salt (sodium) a day. If you have hypertension, you may need to reduce your sodium intake to 1,500 mg a day.  Limit alcohol intake to no more than 1 drink a day for nonpregnant women and 2 drinks a day for men. One drink equals 12 oz of beer, 5 oz of wine, or 1 oz of hard liquor.  Work with your health care provider to maintain a healthy body weight or to lose weight. Ask what an ideal weight is for you.  Get at least 30 minutes of exercise that causes your heart to beat faster (aerobic exercise) most days of the week. Activities may include walking, swimming, or biking.  Work with your health care provider or diet and nutrition specialist (dietitian) to adjust your eating plan to your individual calorie needs. Reading food labels  Check food labels for the amount of sodium per serving. Choose foods with less than 5 percent of the Daily Value of sodium. Generally, foods with less than 300 mg of sodium per serving fit into this eating plan.  To find whole grains, look for the word "whole" as the first word in the ingredient list. Shopping  Buy products labeled as "low-sodium" or "no salt added."  Buy fresh foods. Avoid canned foods and premade or frozen meals. Cooking  Avoid adding salt when cooking. Use salt-free seasonings or herbs instead of table salt or sea salt. Check with your health care provider or pharmacist before using salt substitutes.  Do not fry foods. Cook foods using healthy methods such as baking, boiling, grilling, and broiling instead.  Cook with  heart-healthy oils, such as olive, canola, soybean, or sunflower oil. Meal planning   Eat a balanced diet that includes: ? 5 or more servings of fruits and vegetables each day. At each meal, try to fill half of your plate with fruits and vegetables. ? Up to 6-8 servings of whole grains each day. ? Less than 6 oz of lean meat, poultry, or fish each day. A 3-oz serving of meat is about the same size as a deck of cards. One egg equals 1 oz. ? 2 servings of low-fat dairy each day. ? A serving of nuts, seeds, or beans 5 times each week. ? Heart-healthy fats. Healthy fats called Omega-3 fatty acids are found in foods such as flaxseeds and coldwater fish, like sardines, salmon, and mackerel.  Limit how much you eat of the following: ? Canned or prepackaged foods. ? Food that is high in trans fat, such as fried foods. ? Food that is high in saturated fat, such as fatty meat. ? Sweets, desserts, sugary drinks, and other foods with added sugar. ? Full-fat dairy products.  Do not salt foods before eating.  Try to eat at least 2 vegetarian meals each week.  Eat more home-cooked food and less restaurant, buffet, and fast food.  When eating at a restaurant, ask that your food be prepared with less salt or no salt, if possible. What foods are recommended? The items listed may not be a complete list. Talk with your dietitian about what   dietary choices are best for you. Grains Whole-grain or whole-wheat bread. Whole-grain or whole-wheat pasta. Brown rice. Oatmeal. Quinoa. Bulgur. Whole-grain and low-sodium cereals. Pita bread. Low-fat, low-sodium crackers. Whole-wheat flour tortillas. Vegetables Fresh or frozen vegetables (raw, steamed, roasted, or grilled). Low-sodium or reduced-sodium tomato and vegetable juice. Low-sodium or reduced-sodium tomato sauce and tomato paste. Low-sodium or reduced-sodium canned vegetables. Fruits All fresh, dried, or frozen fruit. Canned fruit in natural juice (without  added sugar). Meat and other protein foods Skinless chicken or turkey. Ground chicken or turkey. Pork with fat trimmed off. Fish and seafood. Egg whites. Dried beans, peas, or lentils. Unsalted nuts, nut butters, and seeds. Unsalted canned beans. Lean cuts of beef with fat trimmed off. Low-sodium, lean deli meat. Dairy Low-fat (1%) or fat-free (skim) milk. Fat-free, low-fat, or reduced-fat cheeses. Nonfat, low-sodium ricotta or cottage cheese. Low-fat or nonfat yogurt. Low-fat, low-sodium cheese. Fats and oils Soft margarine without trans fats. Vegetable oil. Low-fat, reduced-fat, or light mayonnaise and salad dressings (reduced-sodium). Canola, safflower, olive, soybean, and sunflower oils. Avocado. Seasoning and other foods Herbs. Spices. Seasoning mixes without salt. Unsalted popcorn and pretzels. Fat-free sweets. What foods are not recommended? The items listed may not be a complete list. Talk with your dietitian about what dietary choices are best for you. Grains Baked goods made with fat, such as croissants, muffins, or some breads. Dry pasta or rice meal packs. Vegetables Creamed or fried vegetables. Vegetables in a cheese sauce. Regular canned vegetables (not low-sodium or reduced-sodium). Regular canned tomato sauce and paste (not low-sodium or reduced-sodium). Regular tomato and vegetable juice (not low-sodium or reduced-sodium). Pickles. Olives. Fruits Canned fruit in a light or heavy syrup. Fried fruit. Fruit in cream or butter sauce. Meat and other protein foods Fatty cuts of meat. Ribs. Fried meat. Bacon. Sausage. Bologna and other processed lunch meats. Salami. Fatback. Hotdogs. Bratwurst. Salted nuts and seeds. Canned beans with added salt. Canned or smoked fish. Whole eggs or egg yolks. Chicken or turkey with skin. Dairy Whole or 2% milk, cream, and half-and-half. Whole or full-fat cream cheese. Whole-fat or sweetened yogurt. Full-fat cheese. Nondairy creamers. Whipped toppings.  Processed cheese and cheese spreads. Fats and oils Butter. Stick margarine. Lard. Shortening. Ghee. Bacon fat. Tropical oils, such as coconut, palm kernel, or palm oil. Seasoning and other foods Salted popcorn and pretzels. Onion salt, garlic salt, seasoned salt, table salt, and sea salt. Worcestershire sauce. Tartar sauce. Barbecue sauce. Teriyaki sauce. Soy sauce, including reduced-sodium. Steak sauce. Canned and packaged gravies. Fish sauce. Oyster sauce. Cocktail sauce. Horseradish that you find on the shelf. Ketchup. Mustard. Meat flavorings and tenderizers. Bouillon cubes. Hot sauce and Tabasco sauce. Premade or packaged marinades. Premade or packaged taco seasonings. Relishes. Regular salad dressings. Where to find more information:  National Heart, Lung, and Blood Institute: www.nhlbi.nih.gov  American Heart Association: www.heart.org Summary  The DASH eating plan is a healthy eating plan that has been shown to reduce high blood pressure (hypertension). It may also reduce your risk for type 2 diabetes, heart disease, and stroke.  With the DASH eating plan, you should limit salt (sodium) intake to 2,300 mg a day. If you have hypertension, you may need to reduce your sodium intake to 1,500 mg a day.  When on the DASH eating plan, aim to eat more fresh fruits and vegetables, whole grains, lean proteins, low-fat dairy, and heart-healthy fats.  Work with your health care provider or diet and nutrition specialist (dietitian) to adjust your eating plan to your individual   calorie needs. This information is not intended to replace advice given to you by your health care provider. Make sure you discuss any questions you have with your health care provider. Document Released: 01/02/2011 Document Revised: 01/07/2016 Document Reviewed: 01/07/2016 Elsevier Interactive Patient Education  2018 Elsevier Inc.  

## 2017-02-11 LAB — TSH: TSH: 2.79 u[IU]/mL (ref 0.450–4.500)

## 2017-02-11 LAB — BMP8+EGFR
BUN/Creatinine Ratio: 10 (ref 9–20)
BUN: 11 mg/dL (ref 6–24)
CALCIUM: 8.7 mg/dL (ref 8.7–10.2)
CHLORIDE: 103 mmol/L (ref 96–106)
CO2: 24 mmol/L (ref 20–29)
Creatinine, Ser: 1.12 mg/dL (ref 0.76–1.27)
GFR calc non Af Amer: 73 mL/min/{1.73_m2} (ref >59)
GFR, EST AFRICAN AMERICAN: 84 mL/min/{1.73_m2} (ref >59)
Glucose: 91 mg/dL (ref 65–99)
POTASSIUM: 4.9 mmol/L (ref 3.5–5.2)
Sodium: 142 mmol/L (ref 134–144)

## 2017-02-11 LAB — LIPID PANEL
Chol/HDL Ratio: 4.8 ratio (ref 0.0–5.0)
Cholesterol, Total: 221 mg/dL — ABNORMAL HIGH (ref 100–199)
HDL: 46 mg/dL (ref >39)
LDL Calculated: 136 mg/dL — ABNORMAL HIGH (ref 0–99)
TRIGLYCERIDES: 195 mg/dL — AB (ref 0–149)
VLDL CHOLESTEROL CAL: 39 mg/dL (ref 5–40)

## 2017-02-16 ENCOUNTER — Other Ambulatory Visit: Payer: Self-pay | Admitting: Pediatrics

## 2017-02-16 DIAGNOSIS — E785 Hyperlipidemia, unspecified: Secondary | ICD-10-CM

## 2017-02-16 MED ORDER — PRAVASTATIN SODIUM 40 MG PO TABS: 40.0000 mg | ORAL_TABLET | Freq: Every day | ORAL | 3 refills | Status: DC

## 2017-03-06 ENCOUNTER — Emergency Department (HOSPITAL_COMMUNITY): Payer: Medicaid Other

## 2017-03-06 ENCOUNTER — Telehealth: Payer: Self-pay | Admitting: Internal Medicine

## 2017-03-06 ENCOUNTER — Encounter (HOSPITAL_COMMUNITY): Payer: Self-pay | Admitting: *Deleted

## 2017-03-06 ENCOUNTER — Emergency Department (HOSPITAL_COMMUNITY)
Admission: EM | Admit: 2017-03-06 | Discharge: 2017-03-06 | Disposition: A | Discharge status: Home / Self Care | Payer: Medicaid Other | Attending: Emergency Medicine | Admitting: Emergency Medicine

## 2017-03-06 DIAGNOSIS — R002 Palpitations: Secondary | ICD-10-CM | POA: Diagnosis not present

## 2017-03-06 DIAGNOSIS — Z87891 Personal history of nicotine dependence: Secondary | ICD-10-CM | POA: Insufficient documentation

## 2017-03-06 DIAGNOSIS — Z85038 Personal history of other malignant neoplasm of large intestine: Secondary | ICD-10-CM | POA: Diagnosis not present

## 2017-03-06 DIAGNOSIS — R000 Tachycardia, unspecified: Secondary | ICD-10-CM | POA: Diagnosis present

## 2017-03-06 DIAGNOSIS — I10 Essential (primary) hypertension: Secondary | ICD-10-CM | POA: Insufficient documentation

## 2017-03-06 LAB — CBC WITH DIFFERENTIAL/PLATELET
Basophils Absolute: 0 10*3/uL (ref 0.0–0.1)
Basophils Relative: 0 %
Eosinophils Absolute: 0.2 10*3/uL (ref 0.0–0.7)
Eosinophils Relative: 3 %
HEMATOCRIT: 47.2 % (ref 39.0–52.0)
HEMOGLOBIN: 15.8 g/dL (ref 13.0–17.0)
LYMPHS ABS: 1.7 10*3/uL (ref 0.7–4.0)
LYMPHS PCT: 36 %
MCH: 32.2 pg (ref 26.0–34.0)
MCHC: 33.5 g/dL (ref 30.0–36.0)
MCV: 96.3 fL (ref 78.0–100.0)
Monocytes Absolute: 0.4 10*3/uL (ref 0.1–1.0)
Monocytes Relative: 8 %
NEUTROS ABS: 2.5 10*3/uL (ref 1.7–7.7)
NEUTROS PCT: 53 %
Platelets: 207 10*3/uL (ref 150–400)
RBC: 4.9 MIL/uL (ref 4.22–5.81)
RDW: 12.2 % (ref 11.5–15.5)
WBC: 4.8 10*3/uL (ref 4.0–10.5)

## 2017-03-06 LAB — BASIC METABOLIC PANEL
ANION GAP: 12 (ref 5–15)
BUN: 18 mg/dL (ref 6–20)
CHLORIDE: 108 mmol/L (ref 101–111)
CO2: 20 mmol/L — ABNORMAL LOW (ref 22–32)
CREATININE: 0.96 mg/dL (ref 0.61–1.24)
Calcium: 9.1 mg/dL (ref 8.9–10.3)
GFR calc non Af Amer: >60 mL/min (ref >60)
Glucose, Bld: 96 mg/dL (ref 65–99)
POTASSIUM: 3.8 mmol/L (ref 3.5–5.1)
Sodium: 140 mmol/L (ref 135–145)

## 2017-03-06 LAB — TROPONIN I: Troponin I: <0.03 ng/mL (ref <0.03)

## 2017-03-06 LAB — MAGNESIUM: Magnesium: 1.7 mg/dL (ref 1.7–2.4)

## 2017-03-06 LAB — TSH: TSH: 5.031 u[IU]/mL — ABNORMAL HIGH (ref 0.350–4.500)

## 2017-03-06 MED ORDER — SODIUM CHLORIDE 0.9 % IV BOLUS (SEPSIS)
500.0000 mL | Freq: Once | INTRAVENOUS | Status: AC
  Administered 2017-03-06: 500 mL via INTRAVENOUS

## 2017-03-06 MED ORDER — THIAMINE HCL 100 MG/ML IJ SOLN
100.0000 mg | Freq: Once | INTRAMUSCULAR | Status: AC
  Administered 2017-03-06: 100 mg via INTRAVENOUS
  Filled 2017-03-06: qty 2

## 2017-03-06 MED ORDER — DILTIAZEM HCL 30 MG PO TABS: 30.0000 mg | ORAL_TABLET | Freq: Three times a day (TID) | ORAL | 0 refills | Status: DC | PRN

## 2017-03-06 NOTE — Telephone Encounter (Signed)
Earl [name] is calling because he is in AFIB a, his heart rate is 176 . Please call

## 2017-03-06 NOTE — Telephone Encounter (Signed)
Received call transferred directly from operator and spoke with pt.  He reports he felt elevated heart rate and used pulse ox to check heart rate.  Heart rate is currently running 160-180.  Yesterday he had episode of dark spots in his vision.  Has not had elevated heart rate like this since ablation. I advised pt to call 911 due to elevated heart rate.  He would prefer to have someone transport him to hospital.  He is near [facility] and will go to ED there.

## 2017-03-06 NOTE — Telephone Encounter (Signed)
Pt was evaluated/treated in the ED.

## 2017-03-06 NOTE — ED Provider Notes (Signed)
Emergency Department Provider Note   I have reviewed the triage vital signs and the nursing notes.   HISTORY  Chief Complaint Tachycardia   HPI Earl [name] is a 57 y.o. male with PMH of EtOH abuse, a-flutter, HTN, and depression presents to the emergency department for evaluation of sudden onset palpitations with HR in the 180s at home.  The patient is followed by cardiology [name] who performed an ablation in July 2018.  Since that time, the patient has not had palpitation episodes.  He was told at that time that he would need to stop drinking to prevent further symptoms. The patient did start drinking several months ago.  He drinks 1-2 bottles of wine per night.  No history of complicated withdrawal.   The patient is been compliant with his medications but is no longer anticoagulated after the ablation was performed.  He was previously on Eliquis.  Today he woke from sleep with palpitations symptoms.  No chest pain or dyspnea.  He has a pulse ox machine at home so apply that to his finger and found his pulse to be anywhere from 160-180.  He felt lightheaded.  He called [name] office who advised that he call 911 and present to the emergency department.  After approximately 1 hour of symptoms his palpitations resolved.  Only new medication is Amlodipine started in mid January. PCP is following TSH levels as an outpatient.    Past Medical History:  Diagnosis Date  . Alcohol abuse   . Colon cancer ([place])   . Depression   . Hypertension   . Typical atrial flutter [facility])     Patient Active Problem List   Diagnosis Date Noted  . Swelling of left thumb 10/01/2016  . Family hx of colon cancer 02/04/2016  . Alcohol use 11/30/2015  . Hematuria 11/30/2015  . Hematochezia 11/30/2015  . Special screening for malignant neoplasms, colon 09/19/2015  . Family history of colon cancer 09/19/2015  . Neuropathy 08/30/2015  . Rectal bleeding 08/30/2015  . Essential hypertension  08/30/2015  . Abnormal brain MRI 08/30/2015  . Vitamin B12 deficiency 08/30/2015  . BMI 32.0-32.9,adult 08/30/2015    Past Surgical History:  Procedure Laterality Date  . A-FLUTTER ABLATION N/A 07/31/2016   Procedure: A-Flutter Ablation;  Surgeon: [name], MD;  Location: [place] [facility];  Service: Cardiovascular;  Laterality: N/A;  . COLONOSCOPY N/A 04/17/2016   Procedure: COLONOSCOPY;  Surgeon: [name], MD;  Location[facility];  Service: Endoscopy;  Laterality: N/A;  1200  . MINOR CARPAL TUNNEL     right  . POLYPECTOMY  04/17/2016   Procedure: POLYPECTOMY;  Surgeon: [name], MD;  Location: [facility];  Service: Endoscopy;;  sigmoid    Current Outpatient Rx  . Order #: [id number] Class: Historical Med  . Order #: [id number] Class: Normal  . Order #: [id number] Class: Historical Med  . Order #: [id number] Class: Historical Med  . Order #: [id number] Class: Print  . Order #: [id number] Class: Historical Med  . Order #: [id number] Class: Historical Med  . Order #: [id number] Class: Historical Med  . Order #: [id number] Class: Historical Med  . Order #: [id number] Class: Historical Med  . Order #: [id number] Class: Normal  . Order #: [id number] Class: Normal  . Order #: [id number] Class: Historical Med  . Order #: [id number] Class: Historical Med  . Order #: [id number] Class: Historical Med  . Order #: [id number] Class: Historical Med    Allergies Amitriptyline; Amoxicillin; Asa [aspirin]; Clonidine derivatives; and Hydrochlorothiazide  Family History  Problem Relation Age of Onset  . Heart disease Father   . Coronary artery disease Father   . Coronary artery disease Paternal Grandfather     Social History Social History   Tobacco Use  . Smoking status: Former Smoker    Last attempt to quit: 03/03/1979    Years since quitting: 38.0  . Smokeless tobacco: Never Used  Substance Use Topics  . Alcohol use: No    Alcohol/week: 4.8 oz    Types: 8 Cans of beer per week     Comment: drinks heavily at times  . Drug use: No    Review of Systems  Constitutional: No fever/chills Eyes: No visual changes. ENT: No sore throat. Cardiovascular: Denies chest pain. Positive palpitations and lightheadedness. Respiratory: Denies shortness of breath. Gastrointestinal: No abdominal pain.  No nausea, no vomiting.  No diarrhea.  No constipation. Genitourinary: Negative for dysuria. Musculoskeletal: Negative for back pain. Skin: Negative for rash. Neurological: Negative for headaches, focal weakness or numbness.  10-point ROS otherwise negative.  ____________________________________________   PHYSICAL EXAM:  VITAL SIGNS: ED Triage Vitals  Enc Vitals Group     BP 03/06/17 1101 130/86     Pulse Rate 03/06/17 1057 89     Resp 03/06/17 1057 18     Temp 03/06/17 1057 97.6 F (36.4 C)     Temp src --      SpO2 03/06/17 1057 99 %     Weight 03/06/17 1055 255 lb (115.7 kg)     Height 03/06/17 1055 5\' 10"  (1.778 m)   Constitutional: Alert and oriented. Well appearing and in no acute distress. Eyes: Conjunctivae are normal. Head: Atraumatic. Nose: No congestion/rhinnorhea. Mouth/Throat: Mucous membranes are moist.  Neck: No stridor.  Cardiovascular: Normal rate, regular rhythm. Good peripheral circulation. Grossly normal heart sounds.   Respiratory: Normal respiratory effort.  No retractions. Lungs CTAB. Gastrointestinal: Soft and nontender. No distention.  Musculoskeletal: No lower extremity tenderness nor edema. No gross deformities of extremities. Neurologic:  Normal speech and language. No gross focal neurologic deficits are appreciated.  Skin:  Skin is warm, dry and intact. No rash noted.  ____________________________________________   LABS (all labs ordered are listed, but only abnormal results are displayed)  Labs Reviewed  BASIC METABOLIC PANEL - Abnormal; Notable for the following components:      Result Value   CO2 20 (*)    All other components  within normal limits  TSH - Abnormal; Notable for the following components:   TSH 5.031 (*)    All other components within normal limits  CBC WITH DIFFERENTIAL/PLATELET  TROPONIN I  MAGNESIUM   ____________________________________________  EKG   EKG Interpretation  Date/Time:  Friday March 06 2017 11:01:22 EST Ventricular Rate:  88 PR Interval:    QRS Duration: 95 QT Interval:  362 QTC Calculation: 438 R Axis:   68 Text Interpretation:  Sinus rhythm No STEMI.  Confirmed by Nanda Quinton [phone]) on 03/06/2017 11:33:05 AM       ____________________________________________  RADIOLOGY  Dg Chest 2 View  Result Date: 03/06/2017 CLINICAL DATA:  Palpitations, shortness of Breath EXAM: CHEST  2 VIEW COMPARISON:  08/26/2015 FINDINGS: Heart and mediastinal contours are within normal limits. No focal opacities or effusions. No acute bony abnormality. IMPRESSION: No active cardiopulmonary disease. Electronically Signed   By: Rolm Baptise M.D.   On: 03/06/2017 12:04    ____________________________________________   PROCEDURES  Procedure(s) performed:   Procedures  None ____________________________________________   INITIAL IMPRESSION / ASSESSMENT  AND PLAN / ED COURSE  Pertinent labs & imaging results that were available during my care of the patient were reviewed by me and considered in my medical decision making (see chart for details).  Patient presents to the emergency department for evaluation of palpitations.  He had proximal and one episode of severe palpitations with lightheadedness and pulse recorded at home of 180.  On arrival to the emergency department he is normal sinus rhythm with a rate of 80.  Never had chest pain or dyspnea.  Ablation performed in July 2018 by [name].  Patient is not anticoagulated.  He did resume drinking alcohol as noted above.  Plan for baseline labs including TSH.  Will give IV fluids, thiamine, and follow chest x-ray.  12:43 PM Spoke  with Dr. Domenic Polite regarding the patient's presentation.  He reviewed the chart and agrees with prescribing diltiazem 30 mg tablets to use as needed.  Advised the patient stop drinking and call [name] office today to schedule a close follow-up appointment.  Defer any decision to restart anticoagulation to the primary cardiologist.   At this time, I do not feel there is any life-threatening condition present. I have reviewed and discussed all results (EKG, imaging, lab, urine as appropriate), exam findings with patient. I have reviewed nursing notes and appropriate previous records.  I feel the patient is safe to be discharged home without further emergent workup. Discussed usual and customary return precautions. Patient and family (if present) verbalize understanding and are comfortable with this plan.  Patient will follow-up with their primary care provider. If they do not have a primary care provider, information for follow-up has been provided to them. All questions have been answered.  ____________________________________________  FINAL CLINICAL IMPRESSION(S) / ED DIAGNOSES  Final diagnoses:  Palpitations     MEDICATIONS GIVEN DURING THIS VISIT:  Medications  sodium chloride 0.9 % bolus 500 mL (0 mLs Intravenous Stopped 03/06/17 1301)  thiamine (B-1) injection 100 mg (100 mg Intravenous Given 03/06/17 1145)     NEW OUTPATIENT MEDICATIONS STARTED DURING THIS VISIT:  Discharge Medication List as of 03/06/2017 12:46 PM    START taking these medications   Details  diltiazem (CARDIZEM) 30 MG tablet Take 1 tablet (30 mg total) by mouth 3 (three) times daily as needed (palpitations)., Starting Fri 03/06/2017, Print        Note:  This document was prepared using Dragon voice recognition software and may include unintentional dictation errors.  Nanda Quinton, MD Emergency Medicine    Calix Heinbaugh, [facility], MD 03/06/17 [phone]

## 2017-03-06 NOTE — Discharge Instructions (Signed)
He was seen in the emergency department today for heart palpitations.  Your heart rhythm had returned to normal by the time he arrived in the emergency department.  He will need to follow-up with your cardiologist, [name] by phone today to schedule a follow-up appointment.  We are restarting the diltiazem 30 mg tablets which you can take only as needed if you develop palpitations.  Discuss any additional medications with your cardiologist.   Return to the emergency department with any worsening palpitations, chest pain, shortness of breath, or sudden onset lightheadedness or passing out.

## 2017-03-06 NOTE — ED Triage Notes (Signed)
Pt states his heart rate was over 180 for an hour this morning.  Has had an ablation in the past for atrial flutter.

## 2017-03-09 ENCOUNTER — Encounter: Payer: Self-pay | Admitting: Internal Medicine

## 2017-03-09 ENCOUNTER — Ambulatory Visit: Payer: Medicaid Other | Admitting: Internal Medicine

## 2017-03-09 VITALS — BP 126/92 | HR 74 | Ht 70.0 in | Wt 248.0 lb

## 2017-03-09 DIAGNOSIS — R002 Palpitations: Secondary | ICD-10-CM | POA: Diagnosis not present

## 2017-03-09 DIAGNOSIS — I4892 Unspecified atrial flutter: Secondary | ICD-10-CM

## 2017-03-09 DIAGNOSIS — I10 Essential (primary) hypertension: Secondary | ICD-10-CM

## 2017-03-09 NOTE — Patient Instructions (Signed)

## 2017-03-09 NOTE — Progress Notes (Signed)
PCP: [name], MD Primary Cardiologist: Dr Bronson Ing Primary EP: Dr Rayann Heman  Earl [name] is a 57 y.o. male who presents today for routine electrophysiology followup.  Since last being seen in our clinic, the patient reports doing reasonably well.  He reports no palpitations post ablation and did very well until this past Friday.  Unfortunately, he had returned recently to heavy ETOh and was drinking at least 2 fifths of wine every day.  He reports that he did this because he was "bored".  He is disabled and does not leave his him very often.  His tachypalpitations this past Friday began at rest and lasted about 1 hour.  + terminated with oral cardizem.  He was in sinus by the time he arrived to the ED.  Denies any further episodes.  Today, he denies symptoms of palpitations, chest pain, shortness of breath,  lower extremity edema, new neurologic symptoms, dizziness, presyncope, or syncope.  The patient is otherwise without complaint today.   Past Medical History:  Diagnosis Date  . Alcohol abuse   . Colon cancer ([place])   . Depression   . Hypertension   . Typical atrial flutter [facility])    Past Surgical History:  Procedure Laterality Date  . A-FLUTTER ABLATION N/A 07/31/2016   Procedure: A-Flutter Ablation;  Surgeon: [name], MD;  Location: [place] [facility];  Service: Cardiovascular;  Laterality: N/A;  . COLONOSCOPY N/A 04/17/2016   Procedure: COLONOSCOPY;  Surgeon: [name], MD;  [facility];  Service: Endoscopy;  Laterality: N/A;  1200  . MINOR CARPAL TUNNEL     right  . POLYPECTOMY  04/17/2016   Procedure: POLYPECTOMY;  Surgeon: [name], MD;  Location: [facility];  Service: Endoscopy;;  sigmoid    ROS- all systems are reviewed and negatives except as per HPI above  Current Outpatient Medications  Medication Sig Dispense Refill  . ALPRAZolam (XANAX) 1 MG tablet Take 1 mg by mouth 2 (two) times daily as needed for anxiety.     Marland Kitchen amLODipine  (NORVASC) 5 MG tablet Take 1 tablet (5 mg total) by mouth daily. 90 tablet 3  . calcium carbonate (TUMS - DOSED IN MG ELEMENTAL CALCIUM) 500 MG chewable tablet Chew 2-3 tablets by mouth as needed for indigestion or heartburn (Use as directed).     . Cyanocobalamin (B-12 COMPLIANCE INJECTION) 1000 MCG/ML KIT Inject 1,000 mcg as directed every 30 (thirty) days.     Marland Kitchen diltiazem (CARDIZEM) 30 MG tablet Take 1 tablet (30 mg total) by mouth 3 (three) times daily as needed (palpitations). 30 tablet 0  . DULoxetine (CYMBALTA) 30 MG capsule Take 30 mg by mouth 2 (two) times daily.    Marland Kitchen gabapentin (NEURONTIN) 300 MG capsule Take 300 mg by mouth 3 (three) times daily as needed (foot pain).    . hydrocortisone cream 1 % Apply 1 application topically daily as needed for itching.    Marland Kitchen ketoconazole (NIZORAL) 2 % shampoo Apply 1 application topically once a week.    . linaclotide (LINZESS) 72 MCG capsule Take 72 mcg by mouth daily as needed (constipation).    Marland Kitchen lisinopril (PRINIVIL,ZESTRIL) 40 MG tablet TAKE 1 TABLET BY MOUTH ONCE DAILY 90 tablet 0  . pravastatin (PRAVACHOL) 40 MG tablet Take 1 tablet (40 mg total) by mouth daily. 90 tablet 3  . pregabalin (LYRICA) 100 MG capsule Take 100 mg by mouth 2 (two) times daily as needed (pain).    Marland Kitchen  ranitidine (ZANTAC) 150 MG tablet Take 150 mg by mouth daily as needed for heartburn.     . Vitamin D, Ergocalciferol, (DRISDOL) 50000 units CAPS capsule Take 50,000 Units by mouth once a week.     . zinc gluconate 50 MG tablet Take 50 mg by mouth once a week. Take with vitamin d     Current Facility-Administered Medications  Medication Dose Route Frequency Provider Last Rate Last Dose  . cyanocobalamin ((VITAMIN B-12)) injection 1,000 mcg  1,000 mcg Intramuscular Q30 days [name], MD   1,000 mcg at 10/02/16 1545    Physical Exam: Vitals:   03/09/17 1043  BP: (!) 126/92  Pulse: 74  Weight: 248 lb (112.5 kg)  Height: 5' 10"  (1.778 m)    GEN- The patient is  well appearing, alert and oriented x 3 today.   Head- normocephalic, atraumatic Eyes-  Sclera clear, conjunctiva pink Ears- hearing intact Oropharynx- clear Lungs- Clear to ausculation bilaterally, normal work of breathing Heart- Regular rate and rhythm, no murmurs, rubs or gallops, PMI not laterally displaced GI- soft, NT, ND, + BS Extremities- no clubbing, cyanosis, or edema   Assessment and Plan:  1. Palpitations Unclear etiology Possible atach vs afib Likely exacerbated by ETOH Given chads2vasc score of 1, we will continue to follow clinically without initiation of anticoagulation today.   Consider implantable loop recorder implant if he has further palpitations Prn cardizem encouraged today for recurrence of episodes  2. ETOH Cessation advised  I have advised AA.  He declines  3. HTN Stable No change required today  4. Overweight Body mass index is 35.58 kg/m. Regular exercise and weight loss advised  Return to see me in 3 months He will contact my office if problems arise in the interim.  [name] MD, [facility] 03/09/2017 10:57 AM

## 2017-03-10 ENCOUNTER — Ambulatory Visit: Payer: Medicaid Other | Admitting: Pediatrics

## 2017-03-10 ENCOUNTER — Encounter: Payer: Self-pay | Admitting: Pediatrics

## 2017-03-10 VITALS — BP 126/76 | HR 74 | Temp 98.0°F | Ht 70.0 in | Wt 250.0 lb

## 2017-03-10 DIAGNOSIS — Z7289 Other problems related to lifestyle: Secondary | ICD-10-CM

## 2017-03-10 DIAGNOSIS — Z789 Other specified health status: Secondary | ICD-10-CM | POA: Diagnosis not present

## 2017-03-10 DIAGNOSIS — H04202 Unspecified epiphora, left lacrimal gland: Secondary | ICD-10-CM | POA: Diagnosis not present

## 2017-03-10 DIAGNOSIS — I10 Essential (primary) hypertension: Secondary | ICD-10-CM

## 2017-03-10 NOTE — Progress Notes (Signed)
  Subjective:   Patient ID: Earl [name], male    DOB: 1960-05-25, 57 y.o.   MRN: [id number] CC: Follow-up (4 week) multiple med problems HPI: Earl [name] is a 57 y.o. male presenting for Follow-up (4 week)  HTN: BPs at home 120s SBP. No CP, no HA  EtOH use: says last drink 5 days ago, no h/o withdrawals, tremors, seziures with cessation.   L eye burns and waters at times. Ongoing for months. No redness to the eye. No change in vision. No pain with light.   Walking 2 miles a day   Relevant past medical, surgical, family and social history reviewed. Allergies and medications reviewed and updated. Social History   Tobacco Use  Smoking Status Former Smoker  . Last attempt to quit: 03/03/1979  . Years since quitting: 38.0  Smokeless Tobacco Never Used   ROS: Per HPI   Objective:    BP 126/76   Pulse 74   Temp 98 F (36.7 C) (Oral)   Ht 5\' 10"  (1.778 m)   Wt 250 lb (113.4 kg)   BMI 35.87 kg/m   Wt Readings from Last 3 Encounters:  03/10/17 250 lb (113.4 kg)  03/09/17 248 lb (112.5 kg)  03/06/17 255 lb (115.7 kg)    Gen: NAD, alert, cooperative with exam, NCAT EYES: EOMI, no conjunctival injection, or no icterus. No redness around eyes. PERRL ENT:  TMs pearly gray b/l, OP without erythema LYMPH: no cervical LAD CV: NRRR, normal S1/S2, no murmur, distal pulses 2+ b/l Resp: CTABL, no wheezes, normal WOB Abd: +BS, soft, NT, mildly distended. no guarding or organomegaly Ext: No edema, warm Neuro: Alert and oriented, strength equal b/l UE and LE, coordination grossly normal MSK: normal muscle bulk  Assessment & Plan:  Mariah was seen today for follow-up HTN  Diagnoses and all orders for this visit:  Essential hypertension Much improved. Cont current meds.  Left epiphora Encouraged f/u with ophthalmology  Alcohol use Encouraged maintained abstinence.  Patient has not had withdrawal before.  Now off of all alcohol  Follow up plan: 3-6 months [name],  MD [place]

## 2017-03-10 NOTE — Patient Instructions (Signed)
Call for appointment with eye doctor  Keep checking BPs at home, call me with numbers in 1-2 weeks

## 2017-03-16 ENCOUNTER — Telehealth: Payer: Self-pay | Admitting: Pediatrics

## 2017-03-16 NOTE — Telephone Encounter (Signed)
Great, thanks. I am OK with those numbers. He should continue to check when he can. If > 140 on top or >90 on bottom let me know.

## 2017-03-16 NOTE — Telephone Encounter (Signed)
BP readings 120s/70s and low 130s/low 80s

## 2017-03-23 ENCOUNTER — Telehealth: Payer: Self-pay | Admitting: Internal Medicine

## 2017-03-23 NOTE — Telephone Encounter (Signed)
Patient calling,   States that he had an ablation. Patient asks how risky is caffeine? Would caffeine trigger AFIB?

## 2017-03-23 NOTE — Telephone Encounter (Signed)
Spoke with patient in regards to his caffeine intake post ablation.  I gave him the recommendation of 1 caffeine drink and 1 alcohol drink per day to not exacerbate the afib rhythm.  He verbalized understanding.

## 2017-04-01 ENCOUNTER — Ambulatory Visit: Payer: Medicaid Other | Admitting: Pediatrics

## 2017-04-02 ENCOUNTER — Other Ambulatory Visit: Payer: Self-pay | Admitting: Pediatrics

## 2017-04-02 DIAGNOSIS — I10 Essential (primary) hypertension: Secondary | ICD-10-CM

## 2017-04-02 MED ORDER — LISINOPRIL 40 MG PO TABS: 40.0000 mg | ORAL_TABLET | Freq: Every day | ORAL | 0 refills | Status: DC

## 2017-04-02 NOTE — Telephone Encounter (Signed)
Refill sent to pharmacy.  Patient will need to be seen for any further refills.  Per Dr. Evette Doffing

## 2017-04-03 ENCOUNTER — Ambulatory Visit: Payer: Medicaid Other | Admitting: Pediatrics

## 2017-04-10 ENCOUNTER — Ambulatory Visit: Payer: Medicaid Other | Admitting: Pediatrics

## 2017-04-13 ENCOUNTER — Encounter: Payer: Self-pay | Admitting: Pediatrics

## 2017-04-13 ENCOUNTER — Ambulatory Visit: Payer: Medicaid Other | Admitting: Pediatrics

## 2017-04-13 VITALS — BP 104/68 | HR 62 | Temp 96.7°F | Ht 70.0 in | Wt 242.4 lb

## 2017-04-13 DIAGNOSIS — I10 Essential (primary) hypertension: Secondary | ICD-10-CM | POA: Diagnosis not present

## 2017-04-13 DIAGNOSIS — G629 Polyneuropathy, unspecified: Secondary | ICD-10-CM

## 2017-04-13 DIAGNOSIS — Z125 Encounter for screening for malignant neoplasm of prostate: Secondary | ICD-10-CM

## 2017-04-13 DIAGNOSIS — Z6834 Body mass index (BMI) 34.0-34.9, adult: Secondary | ICD-10-CM

## 2017-04-13 MED ORDER — LOSARTAN POTASSIUM 100 MG PO TABS: 100.0000 mg | ORAL_TABLET | Freq: Every day | ORAL | 3 refills | Status: DC

## 2017-04-13 NOTE — Progress Notes (Signed)
  Subjective:   Patient ID: Earl [name], male    DOB: 1960-07-14, 57 y.o.   MRN: [id number] CC: Follow-up (6 month) Multiple medical problems HPI: Earl [name] is a 57 y.o. male presenting for Follow-up (6 month)  Has continued avoiding EtOH. Says he had no problem quitting EtOH starting a few weeks ago. Plans to continue to avoid it. Thinks it was raising his blood pressure.   HTN: rarely has feeling of lightheadedness when he jumps up to standing  Has appt to see eye doctor upcoming  H/o possible MS per pt. Follows with neurology/Dr. Merlene Laughter. Says he wants a second opinion. 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  No recent episodes of numbness or weakness on one side of his body or the other.  Relevant past medical, surgical, family and social history reviewed. Allergies and medications reviewed and updated. Social History   Tobacco Use  Smoking Status Former Smoker  . Last attempt to quit: 03/03/1979  . Years since quitting: 38.1  Smokeless Tobacco Never Used   ROS: Per HPI   Objective:    BP 104/68   Pulse 62   Temp (!) 96.7 F (35.9 C) (Oral)   Ht 5\' 10"  (1.778 m)   Wt 242 lb 6.4 oz (110 kg)   BMI 34.78 kg/m   Wt Readings from Last 3 Encounters:  04/13/17 242 lb 6.4 oz (110 kg)  03/10/17 250 lb (113.4 kg)  03/09/17 248 lb (112.5 kg)    Gen: NAD, alert, cooperative with exam, NCAT EYES: EOMI, no conjunctival injection, or no icterus ENT:  OP without erythema LYMPH: no cervical LAD CV: NRRR, normal S1/S2, no murmur, distal pulses 2+ b/l Resp: CTABL, no wheezes, normal WOB Abd: +BS, soft, NTND.  Ext: No edema, warm Neuro: Alert and oriented MSK: normal muscle bulk  Assessment & Plan:  Earl [name] was seen today for follow-up multiple medical problems.  Diagnoses and all orders for this visit:  Essential hypertension Much improved.  We will continue to follow at home.  Continue below medicine. -      losartan (COZAAR) 100 MG tablet; Take 1 tablet (100 mg total) by mouth daily.  Screening for prostate cancer -     PSA, total and free  Neuropathy Patient requests second opinion from neurology.  He is not currently having any worsening symptoms.  He has ongoing neuropathy in his legs and feet he has been told is from B12 deficiency.  Currently on B12 injections. -     Ambulatory referral to Neurology  BMI 34 Patient pleased with weight gain since stopping alcohol.  Plans to continue to remain abstinent.  Encouraged more regular exercise.  Follow up plan: Return in about 3 months (around 07/14/2017). [name], MD [place]

## 2017-04-14 ENCOUNTER — Telehealth (INDEPENDENT_AMBULATORY_CARE_PROVIDER_SITE_OTHER): Payer: Self-pay | Admitting: *Deleted

## 2017-04-14 LAB — PSA, TOTAL AND FREE
PROSTATE SPECIFIC AG, SERUM: 0.3 ng/mL (ref 0.0–4.0)
PSA, Free Pct: 40 %
PSA, Free: 0.12 ng/mL

## 2017-04-14 NOTE — Telephone Encounter (Addendum)
Patient had TCS 03/2016 -- when is he due for repeat, we have him for 5 yrs but he is adamant someone told him March of this year -- please advise

## 2017-04-14 NOTE — Telephone Encounter (Signed)
He is right.  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 He needs colonoscopy now.

## 2017-04-16 ENCOUNTER — Encounter (INDEPENDENT_AMBULATORY_CARE_PROVIDER_SITE_OTHER): Payer: Self-pay | Admitting: *Deleted

## 2017-04-16 ENCOUNTER — Telehealth (INDEPENDENT_AMBULATORY_CARE_PROVIDER_SITE_OTHER): Payer: Self-pay | Admitting: *Deleted

## 2017-04-16 ENCOUNTER — Other Ambulatory Visit (INDEPENDENT_AMBULATORY_CARE_PROVIDER_SITE_OTHER): Payer: Self-pay | Admitting: *Deleted

## 2017-04-16 DIAGNOSIS — Z85038 Personal history of other malignant neoplasm of large intestine: Secondary | ICD-10-CM | POA: Insufficient documentation

## 2017-04-16 MED ORDER — PEG 3350-KCL-NA BICARB-NACL 420 G PO SOLR: 4000.0000 mL | Freq: Once | ORAL | 0 refills | Status: AC

## 2017-04-16 NOTE — Telephone Encounter (Signed)
agree

## 2017-04-16 NOTE — Telephone Encounter (Signed)
Patient needs trilyte 

## 2017-04-16 NOTE — Telephone Encounter (Signed)
Referring MD/PCP: vincent   Procedure: tcs  Reason/Indication:  Hx colon ca  Has patient had this procedure before?  Yes, 03/2016  If so, when, by whom and where?    Is there a family history of colon cancer?  Yes, great aunt  Who?  What age when diagnosed?    Is patient diabetic?   no      Does patient have prosthetic heart valve or mechanical valve?  no  Do you have a pacemaker?  no  Has patient ever had endocarditis? no  Has patient had joint replacement within last 12 months?  no  Is patient constipated or do they take laxatives? yes  Does patient have a history of alcohol/drug use?  no  Is patient on blood thinner such as Coumadin, Plavix and/or Aspirin? no  Medications: see epic  Allergies: see epic  Medication Adjustment per Dr Lindi Adie, NP:   Procedure date & time: 05/07/17 at 200

## 2017-04-16 NOTE — Telephone Encounter (Signed)
TCS sch'd 05/07/17 at 200 (100), can't reach patient by phone, instructions mailed to patient

## 2017-05-07 ENCOUNTER — Ambulatory Visit (HOSPITAL_COMMUNITY)
Admission: RE | Admit: 2017-05-07 | Discharge: 2017-05-07 | Disposition: A | Discharge status: Home / Self Care | Payer: Medicaid Other | Source: Ambulatory Visit | Attending: Internal Medicine | Admitting: Internal Medicine

## 2017-05-07 ENCOUNTER — Other Ambulatory Visit: Payer: Self-pay

## 2017-05-07 ENCOUNTER — Encounter (HOSPITAL_COMMUNITY)
Admission: RE | Disposition: A | Discharge status: Home / Self Care | Payer: Self-pay | Source: Ambulatory Visit | Attending: Internal Medicine

## 2017-05-07 ENCOUNTER — Encounter (HOSPITAL_COMMUNITY): Payer: Self-pay | Admitting: *Deleted

## 2017-05-07 DIAGNOSIS — Z1211 Encounter for screening for malignant neoplasm of colon: Secondary | ICD-10-CM | POA: Insufficient documentation

## 2017-05-07 DIAGNOSIS — D122 Benign neoplasm of ascending colon: Secondary | ICD-10-CM | POA: Diagnosis not present

## 2017-05-07 DIAGNOSIS — Z85038 Personal history of other malignant neoplasm of large intestine: Secondary | ICD-10-CM | POA: Insufficient documentation

## 2017-05-07 DIAGNOSIS — D123 Benign neoplasm of transverse colon: Secondary | ICD-10-CM

## 2017-05-07 DIAGNOSIS — K648 Other hemorrhoids: Secondary | ICD-10-CM

## 2017-05-07 DIAGNOSIS — Z800 Family history of malignant neoplasm of digestive organs: Secondary | ICD-10-CM | POA: Diagnosis not present

## 2017-05-07 DIAGNOSIS — Z87891 Personal history of nicotine dependence: Secondary | ICD-10-CM | POA: Insufficient documentation

## 2017-05-07 DIAGNOSIS — I10 Essential (primary) hypertension: Secondary | ICD-10-CM | POA: Diagnosis not present

## 2017-05-07 DIAGNOSIS — Z79899 Other long term (current) drug therapy: Secondary | ICD-10-CM | POA: Diagnosis not present

## 2017-05-07 DIAGNOSIS — F329 Major depressive disorder, single episode, unspecified: Secondary | ICD-10-CM | POA: Diagnosis not present

## 2017-05-07 DIAGNOSIS — Z08 Encounter for follow-up examination after completed treatment for malignant neoplasm: Secondary | ICD-10-CM | POA: Diagnosis not present

## 2017-05-07 DIAGNOSIS — Z9889 Other specified postprocedural states: Secondary | ICD-10-CM | POA: Diagnosis not present

## 2017-05-07 HISTORY — DX: Calculus of gallbladder without cholecystitis without obstruction: K80.20

## 2017-05-07 HISTORY — PX: COLONOSCOPY: SHX5424

## 2017-05-07 HISTORY — DX: Calculus of kidney: N20.0

## 2017-05-07 SURGERY — COLONOSCOPY
Anesthesia: Moderate Sedation

## 2017-05-07 MED ORDER — MEPERIDINE HCL 50 MG/ML IJ SOLN
INTRAMUSCULAR | Status: DC | PRN
  Administered 2017-05-07 (×4): 25 mg via INTRAVENOUS

## 2017-05-07 MED ORDER — SODIUM CHLORIDE 0.9 % IV SOLN
INTRAVENOUS | Status: DC
  Administered 2017-05-07: 14:00:00 via INTRAVENOUS

## 2017-05-07 MED ORDER — STERILE WATER FOR IRRIGATION IR SOLN
Status: DC | PRN
  Administered 2017-05-07: 100 mL

## 2017-05-07 MED ORDER — MIDAZOLAM HCL 5 MG/5ML IJ SOLN
INTRAMUSCULAR | Status: DC | PRN
  Administered 2017-05-07: 2 mg via INTRAVENOUS
  Administered 2017-05-07: 3 mg via INTRAVENOUS
  Administered 2017-05-07: 2 mg via INTRAVENOUS

## 2017-05-07 MED ORDER — MIDAZOLAM HCL 5 MG/5ML IJ SOLN
INTRAMUSCULAR | Status: AC
  Filled 2017-05-07: qty 10

## 2017-05-07 MED ORDER — MEPERIDINE HCL 50 MG/ML IJ SOLN
INTRAMUSCULAR | Status: AC
  Filled 2017-05-07: qty 1

## 2017-05-07 NOTE — Discharge Instructions (Signed)
Resume usual medications and diet as before. No driving for 24 hours. Patient will call with results of biopsy and blood test. Next colonoscopy in 3 years.   Colonoscopy, Adult, Care After This sheet gives you information about how to care for yourself after your procedure. Your health care provider may also give you more specific instructions. If you have problems or questions, contact your health care provider. What can I expect after the procedure? Dr Laural Golden: [phone]; after hours and weekends call the hospital and have the GI doctor on call paged; they will call you back.  After the procedure, it is common to have:  A small amount of blood in your stool for 24 hours after the procedure.  Some gas.  Mild abdominal cramping or bloating.  Follow these instructions at home: General instructions   For the first 24 hours after the procedure: ? Do not drive or use machinery. ? Do not sign important documents. ? Do not drink alcohol. ? Do your regular daily activities at a slower pace than normal. ? Eat soft, easy-to-digest foods. ? Rest often.  Take over-the-counter or prescription medicines only as told by your health care provider.  It is up to you to get the results of your procedure. Ask your health care provider, or the department performing the procedure, when your results will be ready. Relieving cramping and bloating  Try walking around when you have cramps or feel bloated.  Eating and drinking   Drink enough fluid to keep your urine clear or pale yellow.  Resume your normal diet as instructed by your health care provider.\  Contact a health care provider if:  You have blood in your stool 2-3 days after the procedure. Get help right away if:  You have more than a small spotting of blood in your stool.  You pass large blood clots in your stool.  Your abdomen is swollen.  You have nausea or vomiting.  You have a fever.  You have increasing abdominal  pain that is not relieved with medicine. This information is not intended to replace advice given to you by your health care provider. Make sure you discuss any questions you have with your health care provider. Document Released: 08/28/2003 Document Revised: 10/08/2015 Document Reviewed: 03/27/2015 Elsevier Interactive Patient Education  [employer].

## 2017-05-07 NOTE — Op Note (Signed)
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 Providers:                Hildred Laser, MD, Rosina Lowenstein, RN, Nelma Rothman,                            Technician Referring MD:             Berlin Hun. Evette Doffing, MD Medicines:                Meperidine 100 mg IV, Midazolam 9 mg IV Complications:            No immediate complications. Estimated Blood Loss:     Estimated blood loss was minimal. Procedure:                Pre-Anesthesia Assessment:                           - Prior to the procedure, a History and Physical                            was performed, and patient medications and                            allergies were reviewed. [name] tolerance of                            previous anesthesia was also reviewed. The risks                            and benefits of the procedure and the sedation                            options and risks were discussed with the patient.                            All questions were answered, and informed consent                            was obtained. Prior Anticoagulants: The patient has                            taken no previous anticoagulant or antiplatelet  agents. ASA Grade Assessment: II - A patient with                            mild systemic disease. After reviewing the risks                            and benefits, the patient was deemed in                            satisfactory condition to undergo the procedure.                           After obtaining informed consent, the colonoscope                            was passed  under direct vision. Throughout the                            procedure, the patient's blood pressure, pulse, and                            oxygen saturations were monitored continuously. The                            [id number] ([id number]) scope was introduced through                            the anus and advanced to the the cecum, identified                            by appendiceal orifice and ileocecal valve. The                            colonoscopy was performed without difficulty. The                            patient tolerated the procedure well. The quality                            of the bowel preparation was adequate to identify                            polyps. The ileocecal valve, appendiceal orifice,                            and rectum were photographed. Scope In: 2:36:24 PM Scope Out: 3:01:14 PM Scope Withdrawal Time: 0 hours 16 minutes 25 seconds  Total Procedure Duration: 0 hours 24 minutes 50 seconds  Findings:      The perianal and digital rectal examinations were normal.      Two small polyps were found in the transverse colon and ascending colon.       The polyp was sessile.      A post polypectomy scar was found in the distal sigmoid colon.  Internal hemorrhoids were found during retroflexion. The hemorrhoids       were small. Impression:               - Two small polyps in the transverse colon and in                            the ascending colon. Biopsied.                           - Post-polypectomy scar in the distal sigmoid colon.                           - Internal hemorrhoids. Moderate Sedation:      Moderate (conscious) sedation was administered by the endoscopy nurse       and supervised by the endoscopist. The following parameters were       monitored: oxygen saturation, heart rate, blood pressure, CO2       capnography and response to care. Total physician intraservice time was       30 minutes. Recommendation:           - Patient has a  contact number available for                            emergencies. The signs and symptoms of potential                            delayed complications were discussed with the                            patient. Return to normal activities tomorrow.                            Written discharge instructions were provided to the                            patient.                           - Resume previous diet today.                           - Continue present medications.                           - Await pathology results.                           - Check CEA level today.                           - Repeat colonoscopy in 3 years for surveillance. Procedure Code(s):        --- Professional ---                           [phone], Colonoscopy, flexible; with biopsy, single  or multiple                           G0500, Moderate sedation services provided by the                            same physician or other qualified health care                            professional performing a gastrointestinal                            endoscopic service that sedation supports,                            requiring the presence of an independent trained                            observer to assist in the monitoring of the                            patient's level of consciousness and physiological                            status; initial 15 minutes of intra-service time;                            patient age 53 years or older (additional time may                            be reported with [phone], as appropriate)                           [phone], Moderate sedation services provided by the                            same physician or other qualified health care                            professional performing the diagnostic or                            therapeutic service that the sedation supports,                            requiring the presence of an independent  trained                            observer to assist in the monitoring of the                            patient's level of consciousness and physiological                            status;  each additional 15 minutes intraservice                            time (List separately in addition to code for                            primary service) Diagnosis Code(s):        --- Professional ---                           D74.128, Personal history of other malignant                            neoplasm of large intestine                           D12.3, Benign neoplasm of transverse colon (hepatic                            flexure or splenic flexure)                           D12.2, Benign neoplasm of ascending colon                           Z98.890, Other specified postprocedural states                           K64.8, Other hemorrhoids CPT copyright 2017 American [employer]. All rights reserved. The codes documented in this report are preliminary and upon coder review may  be revised to meet current compliance requirements. Hildred Laser, MD Hildred Laser, MD 05/07/2017 3:11:46 PM This report has been signed electronically. Number of Addenda: 0

## 2017-05-07 NOTE — H&P (Signed)
Earl [name] is an 57 y.o. male.   Chief Complaint: Patient is here for colonoscopy. HPI: She is 57 year old Caucasian male who underwent screening colonoscopy in March last year and was found to have polyp at sigmoid colon revealing invasive carcinoma.  Abdominopelvic CT was unremarkable.  CEA was normal.  Patient decided against surgery. He has had no problems until 12 days ago when he passed blood-tinged mucus after he took high-dose gabapentin. States he has lost 20 pounds by cutting back on calorie intake and walking 2 miles a day. Family history is positive for CRC in maternal aunt who was in her 30s at the time of diagnosis.  Past Medical History:  Diagnosis Date  . Alcohol abuse   . Colon cancer ([place])   . Depression   . Gall stones   . Hypertension   . Kidney stone   . Typical atrial flutter [facility])     Past Surgical History:  Procedure Laterality Date  . A-FLUTTER ABLATION N/A 07/31/2016   Procedure: A-Flutter Ablation;  Surgeon: [name], MD;  Location: [place] [facility];  Service: Cardiovascular;  Laterality: N/A;  . COLONOSCOPY N/A 04/17/2016   Procedure: COLONOSCOPY;  Surgeon: [name], MD;  [facility];  Service: Endoscopy;  Laterality: N/A;  1200  . MINOR CARPAL TUNNEL     right  . POLYPECTOMY  04/17/2016   Procedure: POLYPECTOMY;  Surgeon: [name], MD;  [facility];  Service: Endoscopy;;  sigmoid    Family History  Problem Relation Age of Onset  . Heart disease Father   . Coronary artery disease Father   . Coronary artery disease Paternal Grandfather    Social History:  reports that he quit smoking about 38 years ago. He has never used smokeless tobacco. He reports that he does not drink alcohol or use drugs.  Allergies:  Allergies  Allergen Reactions  . Amitriptyline Anaphylaxis  . Amoxicillin Anaphylaxis and Rash    Has patient had a PCN reaction causing immediate rash, facial/tongue/throat swelling, SOB or  lightheadedness with hypotension: Yes Has patient had a PCN reaction causing severe rash involving mucus membranes or skin necrosis: No Has patient had a PCN reaction that required hospitalization: No Has patient had a PCN reaction occurring within the last 10 years: Yes If all of the above answers are "NO", then may proceed with Cephalosporin use.   Diona Fanti [Aspirin] Anaphylaxis  . Clonidine Derivatives Other (See Comments)    Severe Dry Mouth  . Hydrochlorothiazide     Caused rectal bleeding and hematuria  . Lisinopril     Adverse reaction--causes cough    Facility-Administered Medications Prior to Admission  Medication Dose Route Frequency Provider Last Rate Last Dose  . cyanocobalamin ((VITAMIN B-12)) injection 1,000 mcg  1,000 mcg Intramuscular Q30 days [name], MD   1,000 mcg at 10/02/16 1545   Medications Prior to Admission  Medication Sig Dispense Refill  . ALPRAZolam (XANAX) 1 MG tablet Take 1 mg by mouth 2 (two) times daily as needed for anxiety.     Marland Kitchen diltiazem (CARTIA XT) 120 MG 24 hr capsule Take 120 mg by mouth daily.    . DULoxetine (CYMBALTA) 30 MG capsule Take 30 mg by mouth daily.     Marland Kitchen ketoconazole (NIZORAL) 2 % shampoo Apply 1 application topically once a week.    . linaclotide (LINZESS) 72 MCG capsule Take 72 mcg by mouth daily as needed (constipation).    Marland Kitchen losartan (  COZAAR) 100 MG tablet Take 1 tablet (100 mg total) by mouth daily. 90 tablet 3  . pravastatin (PRAVACHOL) 40 MG tablet Take 1 tablet (40 mg total) by mouth daily. 90 tablet 3  . pregabalin (LYRICA) 100 MG capsule Take 100 mg by mouth 3 (three) times daily.     Marland Kitchen topiramate (TOPAMAX) 25 MG capsule Take 50 mg by mouth at bedtime.    . Vitamin D, Ergocalciferol, (DRISDOL) 50000 units CAPS capsule Take 50,000 Units by mouth every 14 (fourteen) days. Fridays    . zinc gluconate 50 MG tablet Take 50 mg by mouth once a week. Take with vitamin d    . amLODipine (NORVASC) 5 MG tablet Take 1 tablet (5  mg total) by mouth daily. (Patient not taking: Reported on 04/30/2017) 90 tablet 3    No results found for this or any previous visit (from the past 48 hour(s)). No results found.  ROS  Blood pressure 108/71, pulse (!) 54, temperature (!) 97.4 F (36.3 C), temperature source Oral, resp. rate 18, height 5\' 10"  (1.778 m), weight 237 lb (107.5 kg), SpO2 100 %. Physical Exam  Constitutional: He appears well-developed and well-nourished.  HENT:  Mouth/Throat: Oropharynx is clear and moist.  Eyes: Conjunctivae are normal. No scleral icterus.  Neck: No thyromegaly present.  Cardiovascular: Normal rate, regular rhythm and normal heart sounds.  No murmur heard. Respiratory: Effort normal and breath sounds normal.  GI:  Abdomen is full.  Soft and nontender without organomegaly or masses.  Musculoskeletal: He exhibits no edema.  Lymphadenopathy:    He has no cervical adenopathy.  Neurological: He is alert.  Skin: Skin is warm and dry.     Assessment/Plan History of invasive adenocarcinoma and a tubular adenoma. Surveillance colonoscopy.Hildred Laser, MD 05/07/2017, 2:27 PM

## 2017-05-08 LAB — CEA: CEA1: 0.7 ng/mL (ref 0.0–4.7)

## 2017-05-11 ENCOUNTER — Other Ambulatory Visit (INDEPENDENT_AMBULATORY_CARE_PROVIDER_SITE_OTHER): Payer: Self-pay | Admitting: *Deleted

## 2017-05-11 DIAGNOSIS — Z85038 Personal history of other malignant neoplasm of large intestine: Secondary | ICD-10-CM

## 2017-05-13 ENCOUNTER — Encounter (HOSPITAL_COMMUNITY): Payer: Self-pay | Admitting: Internal Medicine

## 2017-05-19 ENCOUNTER — Encounter: Payer: Self-pay | Admitting: Neurology

## 2017-05-19 ENCOUNTER — Telehealth: Payer: Self-pay | Admitting: Neurology

## 2017-05-19 ENCOUNTER — Other Ambulatory Visit: Payer: Self-pay

## 2017-05-19 ENCOUNTER — Ambulatory Visit: Payer: Medicaid Other | Admitting: Neurology

## 2017-05-19 VITALS — BP 106/73 | HR 62 | Resp 18 | Ht 70.0 in | Wt 238.0 lb

## 2017-05-19 DIAGNOSIS — R269 Unspecified abnormalities of gait and mobility: Secondary | ICD-10-CM

## 2017-05-19 DIAGNOSIS — E538 Deficiency of other specified B group vitamins: Secondary | ICD-10-CM

## 2017-05-19 DIAGNOSIS — Z8639 Personal history of other endocrine, nutritional and metabolic disease: Secondary | ICD-10-CM

## 2017-05-19 DIAGNOSIS — R202 Paresthesia of skin: Secondary | ICD-10-CM | POA: Diagnosis not present

## 2017-05-19 DIAGNOSIS — Z87898 Personal history of other specified conditions: Secondary | ICD-10-CM

## 2017-05-19 DIAGNOSIS — G629 Polyneuropathy, unspecified: Secondary | ICD-10-CM | POA: Diagnosis not present

## 2017-05-19 DIAGNOSIS — G514 Facial myokymia: Secondary | ICD-10-CM

## 2017-05-19 DIAGNOSIS — R9089 Other abnormal findings on diagnostic imaging of central nervous system: Secondary | ICD-10-CM

## 2017-05-19 DIAGNOSIS — F1011 Alcohol abuse, in remission: Secondary | ICD-10-CM | POA: Insufficient documentation

## 2017-05-19 DIAGNOSIS — R200 Anesthesia of skin: Secondary | ICD-10-CM | POA: Diagnosis not present

## 2017-05-19 NOTE — Telephone Encounter (Signed)
Medicaid order sent to GI. They will reach out to the pt to scheduled and obtain auth.

## 2017-05-19 NOTE — Progress Notes (Signed)
[name]  PATIENT: Earl [name] DOB: Feb 24, 1960  REFERRING DOCTOR OR PCP:  Dr. Evette Doffing SOURCE: Patient, notes from primary care, imaging reports, MRI images personally reviewed on PACS  _________________________________   HISTORICAL  CHIEF COMPLAINT:  Chief Complaint  Patient presents with  . Peripheral Neuropathy    Numbness bilat legs from the legs down.  Onset years ago. Until recently he was alternating Lyrica 132m tid for 30 days, then Gabapentin 3054mtid for 30 days.  Sts. when he recently restarted Gabapentin, it caused severe constipation and short lived fever. So, he is no longer taking Gaabpentin, is taking Lyrical10052mid now. Had an MRI  brain in 2014 and would like to discuss those results.  Has CD with him/fim  . Abnormal MRI Brain    HISTORY OF PRESENT ILLNESS:  I had the pleasure seeing your patient, Earl [name] for neurologic consultation regarding his numbness and abnormal brain MRI and question about the possibility of multiple sclerosis.  He is a 57 17 man with numbness in his legs and left facial twitching.      He has had leg numbness x 6-7 years.  He was diagnosed with a severe B12 deficiency at that time.  He was started on B12 shots and the numbness improved but did not resolve.    He had a NCV/EMG by Dr. DooMerlene Laughter He says he was diagnosed with a small fiber polyneuropathy after a biopsy.    He was started on Lyrica with some benefit.    He also notes twitches in the left side of his face that started in early 2017.   He never had wealness.   He also notes some numbness and a funny sensation in the left face.   He sometimes drools on that side.   Imitially, the symptoms seemed to fluctuate but now are more constant.  Sometimes, he feels the eyes jerk a little, especially if looking through a rifle scope.    He notes intermittentdiplopia...more a shadow around the primary image.   He has atrial  flutter, treated with ablation with benefit.    He has ben found to have mild hypothyroid and used to be on synthroid.   He has a history of alcohol abuse (2 bottles of wine a day) but stopped a couple years ago and has not resumed.  I personally reviewed the MRI dated 06/23/2012.  Shows some scattered T2/FLAIR hyperintense foci predominantly in the deep white matter.  There was one focus in the cerebellum also.  This is a nonspecific finding and most likely represents mild chronic microvascular ischemic change.  The lesions do not have a demyelinating appearance.  There were no acute findings.  REVIEW OF SYSTEMS: Constitutional: No fevers, chills, sweats, or change in appetite.     Eyes: No visual changes, double vision, eye pain Ear, nose and throat: No hearing loss, ear pain, nasal congestion, sore throat Cardiovascular: No chest pain, palpitations Respiratory: No shortness of breath at rest or with exertion.   No wheezes GastrointestinaI: No nausea, vomiting, diarrhea, abdominal pain, fecal incontinence Genitourinary: No dysuria, urinary retention or frequency.  No nocturia. Musculoskeletal: No neck pain, back pain Integumentary: No rash, pruritus, skin lesions Neurological: as above Psychiatric: No depression at this time.  No anxiety Endocrine: No palpitations, diaphoresis, change in appetite, change in weigh or increased thirst Hematologic/Lymphatic: No anemia, purpura, petechiae. Allergic/Immunologic: No itchy/runny eyes, nasal congestion, recent allergic reactions, rashes  ALLERGIES: Allergies  Allergen Reactions  . Amitriptyline Anaphylaxis  . Amoxicillin Anaphylaxis and Rash    Has patient had a PCN reaction causing immediate rash, facial/tongue/throat swelling, SOB or lightheadedness with hypotension: Yes Has patient had a PCN reaction causing severe rash involving mucus membranes or skin necrosis: No Has patient had a PCN reaction that required hospitalization: No Has  patient had a PCN reaction occurring within the last 10 years: Yes If all of the above answers are "NO", then may proceed with Cephalosporin use.   Diona Fanti [Aspirin] Anaphylaxis  . Clonidine Derivatives Other (See Comments)    Severe Dry Mouth  . Hydrochlorothiazide     Caused rectal bleeding and hematuria  . Lisinopril     Adverse reaction--causes cough    HOME MEDICATIONS:  Current Outpatient Medications:  .  ALPRAZolam (XANAX) 1 MG tablet, Take 1 mg by mouth 2 (two) times daily as needed for anxiety. , Disp: , Rfl:  .  diltiazem (CARTIA XT) 120 MG 24 hr capsule, Take 120 mg by mouth daily., Disp: , Rfl:  .  DULoxetine (CYMBALTA) 30 MG capsule, Take 30 mg by mouth daily. , Disp: , Rfl:  .  ketoconazole (NIZORAL) 2 % shampoo, Apply 1 application topically once a week., Disp: , Rfl:  .  linaclotide (LINZESS) 72 MCG capsule, Take 72 mcg by mouth daily as needed (constipation)., Disp: , Rfl:  .  losartan (COZAAR) 100 MG tablet, Take 1 tablet (100 mg total) by mouth daily., Disp: 90 tablet, Rfl: 3 .  pravastatin (PRAVACHOL) 40 MG tablet, Take 1 tablet (40 mg total) by mouth daily., Disp: 90 tablet, Rfl: 3 .  pregabalin (LYRICA) 100 MG capsule, Take 100 mg by mouth 3 (three) times daily. , Disp: , Rfl:  .  topiramate (TOPAMAX) 25 MG capsule, Take 50 mg by mouth at bedtime., Disp: , Rfl:  .  Vitamin D, Ergocalciferol, (DRISDOL) 50000 units CAPS capsule, Take 50,000 Units by mouth every 14 (fourteen) days. Fridays, Disp: , Rfl:  .  zinc gluconate 50 MG tablet, Take 50 mg by mouth once a week. Take with vitamin d, Disp: , Rfl:   Current Facility-Administered Medications:  .  cyanocobalamin ((VITAMIN B-12)) injection 1,000 mcg, 1,000 mcg, Intramuscular, Q30 days, [name], MD, 1,000 mcg at 10/02/16 1545  PAST MEDICAL HISTORY: Past Medical History:  Diagnosis Date  . Alcohol abuse   . Colon cancer ([place])   . Depression   . Gall stones   . Hypertension   . Kidney stone   . Typical  atrial flutter ([place])     PAST SURGICAL HISTORY: Past Surgical History:  Procedure Laterality Date  . A-FLUTTER ABLATION N/A 07/31/2016   Procedure: A-Flutter Ablation;  Surgeon: [name], MD;  Location: [place] [facility];  Service: Cardiovascular;  Laterality: N/A;  . COLONOSCOPY N/A 04/17/2016   Procedure: COLONOSCOPY;  Surgeon: [name], MD;  [facility];  Service: Endoscopy;  Laterality: N/A;  1200  . COLONOSCOPY N/A 05/07/2017   Procedure: COLONOSCOPY;  Surgeon: [name], MD;  [facility];  Service: Endoscopy;  Laterality: N/A;  200  . MINOR CARPAL TUNNEL     right  . POLYPECTOMY  04/17/2016   Procedure: POLYPECTOMY;  Surgeon: [name], MD;  [facility];  Service: Endoscopy;;  sigmoid    FAMILY HISTORY: Family History  Problem Relation Age of Onset  . Alzheimer's disease Mother   . Heart disease Father   . Coronary  artery disease Father   . Coronary artery disease Paternal Grandfather     SOCIAL HISTORY:  Social History   Socioeconomic History  . Marital status: Single    Spouse name: Not on file  . Number of children: Not on file  . Years of education: Not on file  . Highest education level: Not on file  Occupational History  . Not on file  Social Needs  . Financial resource strain: Not on file  . Food insecurity:    Worry: Not on file    Inability: Not on file  . Transportation needs:    Medical: Not on file    Non-medical: Not on file  Tobacco Use  . Smoking status: Former Smoker    Last attempt to quit: 03/03/1979    Years since quitting: 38.2  . Smokeless tobacco: Never Used  Substance and Sexual Activity  . Alcohol use: No    Alcohol/week: 4.8 oz    Types: 8 Cans of beer per week    Comment: drinks heavily at times  . Drug use: No  . Sexual activity: Not on file  Lifestyle  . Physical activity:    Days per week: Not on file    Minutes per session: Not on file  . Stress: Not on file    Relationships  . Social connections:    Talks on phone: Not on file    Gets together: Not on file    Attends religious service: Not on file    Active member of club or organization: Not on file    Attends meetings of clubs or organizations: Not on file    Relationship status: Not on file  . Intimate partner violence:    Fear of current or ex partner: Not on file    Emotionally abused: Not on file    Physically abused: Not on file    Forced sexual activity: Not on file  Other Topics Concern  . Not on file  Social History Narrative   Disabled   Lives in [place]:   05/19/17 1305  BP: 106/73  Pulse: 62  Resp: 18  Weight: 238 lb (108 kg)  Height: _0  (1.778 m)    Body mass index is 34.15 kg/m.   General: The patient is well-developed and well-nourished and in no acute distress  Eyes:  Funduscopic exam shows normal optic discs and retinal vessels.  Neck: The neck is supple, no carotid bruits are noted.  The neck is nontender.  Cardiovascular: The heart has a regular rate and rhythm with a normal S1 and S2. There were no murmurs, gallops or rubs.    Skin: Extremities are without significant edema.  Musculoskeletal:  Back is nontender  Neurologic Exam  Mental status: The patient is alert and oriented x 3 at the time of the examination. The patient has apparent normal recent and remote memory, with an apparently normal attention span and concentration ability.   Speech is normal.  Cranial nerves: Extraocular movements are full. Pupils are equal, round, and reactive to light and accomodation.  Visual fields are full.  Facial symmetry is present. There is good facial sensation to soft touch bilaterally.Facial strength is normal.  Trapezius and sternocleidomastoid strength is normal. No dysarthria is noted.  The tongue is midline, and the patient has symmetric elevation of the soft palate. No obvious hearing deficits are noted.  Motor:  Muscle  bulk is normal.   Tone is normal.  Strength is  5 / 5 in all 4 extremities.   Sensory: Sensory testing is intact to pinprick, soft touch and vibration sensation in the arms and he has mild reduced vibration sensation in the toes..  Coordination: Cerebellar testing reveals good finger-nose-finger and heel-to-shin bilaterally.  Gait and station: Station is normal.   Gait is normal. Tandem gait is slightly wide. Romberg is negative.   Reflexes: Deep tendon reflexes are symmetric and normal bilaterally.   Plantar responses are flexor.    DIAGNOSTIC DATA (LABS, IMAGING, TESTING) - I reviewed patient records, labs, notes, testing and imaging myself where available.  Lab Results  Component Value Date   WBC 4.8 03/06/2017   HGB 15.8 03/06/2017   HCT 47.2 03/06/2017   MCV 96.3 03/06/2017   PLT 207 03/06/2017      Component Value Date/Time   NA 140 03/06/2017 1121   NA 142 02/10/2017 1614   K 3.8 03/06/2017 1121   CL 108 03/06/2017 1121   CO2 20 (L) 03/06/2017 1121   GLUCOSE 96 03/06/2017 1121   BUN 18 03/06/2017 1121   BUN 11 02/10/2017 1614   CREATININE 0.96 03/06/2017 1121   CALCIUM 9.1 03/06/2017 1121   PROT 6.6 08/04/2016 1319   ALBUMIN 4.2 08/04/2016 1319   AST 28 08/04/2016 1319   ALT 38 08/04/2016 1319   ALKPHOS 91 08/04/2016 1319   BILITOT 0.5 08/04/2016 1319   GFRNONAA >60 03/06/2017 1121   GFRAA >60 03/06/2017 1121   Lab Results  Component Value Date   CHOL 221 (H) 02/10/2017   HDL 46 02/10/2017   LDLCALC 136 (H) 02/10/2017   TRIG 195 (H) 02/10/2017   CHOLHDL 4.8 02/10/2017   No results found for: HGBA1C Lab Results  Component Value Date   VITAMINB12 >2000 (H) 10/02/2016   Lab Results  Component Value Date   TSH 5.031 (H) 03/06/2017       ASSESSMENT AND PLAN  Numbness and tingling - Plan: MR BRAIN W WO CONTRAST, MR CERVICAL SPINE W WO CONTRAST, ANA w/Reflex, Sedimentation rate  Facial twitching - Plan: MR BRAIN W WO CONTRAST, ANA w/Reflex,  Sedimentation rate  History of non anemic vitamin B12 deficiency - Plan: MR CERVICAL SPINE W WO CONTRAST  Gait disturbance - Plan: MR CERVICAL SPINE W WO CONTRAST  Numbness  Abnormal brain MRI  Vitamin B12 deficiency  Neuropathy  History of alcohol abuse   In summary, Mr. Packard is a 57 year old man who has had a 6 or 7-year history of numbness in his legs that began when he had a severe B12 deficiency.  Symptoms are better and it is likely that the residual numbness is due to the sequela of the B12 deficiency, possibly due to myeloneuropathy.  Additionally he has twitching in the left side of his face.  I did not witness any in the office.  It is possible he has hemifacial spasm though I cannot be certain.  It is also possible that he has some sequela from a prior mild Bell's palsy.   He is concerned about the possibility of multiple sclerosis.  I personally reviewed the MRI images and they are much more consistent with mild chronic microvascular ischemic change rather than MS.  1.   MRI brain and cervical spine to evaluate the etiology of his numbness and facial twitching 2.   Check ANA and ESR 3.   We will call him with the results of the MRI.  He will return as needed.  Thank you  for asking me to see Mr. Haughey.  Please let me know if I can be of further assistance with him or other patients in the future. Richard A. Felecia Shelling, MD, [facility] 1/58/3094, 0:76 PM Certified in Neurology, Clinical Neurophysiology, Sleep Medicine, Pain Medicine and Neuroimaging  [name] [address], [place], [place] [address] [phone]

## 2017-05-20 LAB — ANA W/REFLEX: Anti Nuclear Antibody(ANA): NEGATIVE

## 2017-05-20 LAB — SEDIMENTATION RATE: Sed Rate: 2 mm/hr (ref 0–30)

## 2017-05-21 ENCOUNTER — Telehealth: Payer: Self-pay | Admitting: Neurology

## 2017-05-21 DIAGNOSIS — R9089 Other abnormal findings on diagnostic imaging of central nervous system: Secondary | ICD-10-CM

## 2017-05-21 DIAGNOSIS — S0540XA Penetrating wound of orbit with or without foreign body, unspecified eye, initial encounter: Secondary | ICD-10-CM

## 2017-05-21 NOTE — Telephone Encounter (Signed)
Melissa with [facility] called she stated they will have to do a Xray of the eyes first because the patient may have metal in his eyes. [facility] eye form body. When you get a chance can you put a order in.

## 2017-05-21 NOTE — Telephone Encounter (Signed)
Thank you :)

## 2017-05-25 ENCOUNTER — Telehealth: Payer: Self-pay | Admitting: *Deleted

## 2017-05-25 NOTE — Telephone Encounter (Signed)
Spoke with pt. and reviewed below lab results. He verbalized understanding of same/fim 

## 2017-05-27 ENCOUNTER — Ambulatory Visit
Admission: RE | Admit: 2017-05-27 | Discharge: 2017-05-27 | Disposition: A | Discharge status: Home / Self Care | Payer: Medicaid Other | Source: Ambulatory Visit | Attending: Neurology | Admitting: Neurology

## 2017-05-27 ENCOUNTER — Other Ambulatory Visit: Payer: Self-pay | Admitting: Neurology

## 2017-05-27 ENCOUNTER — Other Ambulatory Visit: Payer: Medicaid Other

## 2017-05-27 DIAGNOSIS — R200 Anesthesia of skin: Secondary | ICD-10-CM

## 2017-05-27 DIAGNOSIS — Z1389 Encounter for screening for other disorder: Secondary | ICD-10-CM

## 2017-05-27 DIAGNOSIS — R202 Paresthesia of skin: Principal | ICD-10-CM

## 2017-05-27 DIAGNOSIS — Z8639 Personal history of other endocrine, nutritional and metabolic disease: Secondary | ICD-10-CM | POA: Diagnosis not present

## 2017-05-27 DIAGNOSIS — G914 Hydrocephalus in diseases classified elsewhere: Secondary | ICD-10-CM | POA: Diagnosis not present

## 2017-05-27 DIAGNOSIS — Z0189 Encounter for other specified special examinations: Secondary | ICD-10-CM

## 2017-05-27 DIAGNOSIS — G514 Facial myokymia: Secondary | ICD-10-CM

## 2017-05-27 DIAGNOSIS — R269 Unspecified abnormalities of gait and mobility: Secondary | ICD-10-CM

## 2017-05-27 MED ORDER — GADOBENATE DIMEGLUMINE 529 MG/ML IV SOLN
20.0000 mL | Freq: Once | INTRAVENOUS | Status: AC | PRN
  Administered 2017-05-27: 20 mL via INTRAVENOUS

## 2017-05-28 ENCOUNTER — Telehealth: Payer: Self-pay | Admitting: *Deleted

## 2017-05-28 NOTE — Telephone Encounter (Signed)
-----   Message from Britt Bottom, MD sent at 05/27/2017  7:40 PM EDT ----- Please have him know that the MRI of the brain was normal for age.  The MRI of the cervical spine shows a normal spinal cord.  He does have some disc bulging and bone spurs in the cervical spine but there is no nerve root compression.

## 2017-05-28 NOTE — Telephone Encounter (Signed)
Spoke with pt. and reviewed below MRI report.  He verbalized understanding of same and will call back for a f/u appt. if sx. do not improve or if they get worse/fim

## 2017-06-19 ENCOUNTER — Ambulatory Visit (INDEPENDENT_AMBULATORY_CARE_PROVIDER_SITE_OTHER): Payer: Medicaid Other | Admitting: Internal Medicine

## 2017-06-19 ENCOUNTER — Encounter: Payer: Self-pay | Admitting: Internal Medicine

## 2017-06-19 VITALS — BP 118/62 | HR 51 | Ht 70.0 in | Wt 231.0 lb

## 2017-06-19 DIAGNOSIS — I10 Essential (primary) hypertension: Secondary | ICD-10-CM

## 2017-06-19 DIAGNOSIS — R002 Palpitations: Secondary | ICD-10-CM | POA: Diagnosis not present

## 2017-06-19 NOTE — Progress Notes (Signed)
PCP: [name], MD Primary Cardiologist: Dr Bronson Ing Primary EP: Dr Rayann Heman  Earl [name] is a 57 y.o. male who presents today for routine electrophysiology followup.  Since last being seen in our clinic, the patient reports doing very well.  Today, he denies symptoms of palpitations, chest pain, shortness of breath,  lower extremity edema, dizziness, presyncope, or syncope.  The patient is otherwise without complaint today.   Past Medical History:  Diagnosis Date  . Alcohol abuse   . Colon cancer ([place])   . Depression   . Gall stones   . Hypertension   . Kidney stone   . Typical atrial flutter [facility])    Past Surgical History:  Procedure Laterality Date  . A-FLUTTER ABLATION N/A 07/31/2016   Procedure: A-Flutter Ablation;  Surgeon: [name], MD;  Location: [place] [facility];  Service: Cardiovascular;  Laterality: N/A;  . COLONOSCOPY N/A 04/17/2016   Procedure: COLONOSCOPY;  Surgeon: [name], MD;  [facility];  Service: Endoscopy;  Laterality: N/A;  1200  . COLONOSCOPY N/A 05/07/2017   Procedure: COLONOSCOPY;  Surgeon: [name], MD;  [facility];  Service: Endoscopy;  Laterality: N/A;  200  . MINOR CARPAL TUNNEL     right  . POLYPECTOMY  04/17/2016   Procedure: POLYPECTOMY;  Surgeon: [name], MD;  [facility];  Service: Endoscopy;;  sigmoid    ROS- all systems are reviewed and negatives except as per HPI above  Current Outpatient Medications  Medication Sig Dispense Refill  . ALPRAZolam (XANAX) 1 MG tablet Take 1 mg by mouth 2 (two) times daily as needed for anxiety.     Marland Kitchen diltiazem (CARTIA XT) 120 MG 24 hr capsule Take 120 mg by mouth daily.    . DULoxetine (CYMBALTA) 30 MG capsule Take 30 mg by mouth daily.     Marland Kitchen ketoconazole (NIZORAL) 2 % shampoo Apply 1 application topically once a week.    . linaclotide (LINZESS) 72 MCG capsule Take 72 mcg by mouth daily as needed (constipation).    Marland Kitchen losartan  (COZAAR) 100 MG tablet Take 1 tablet (100 mg total) by mouth daily. 90 tablet 3  . pravastatin (PRAVACHOL) 40 MG tablet Take 1 tablet (40 mg total) by mouth daily. 90 tablet 3  . pregabalin (LYRICA) 100 MG capsule Take 100 mg by mouth 3 (three) times daily.     Marland Kitchen topiramate (TOPAMAX) 25 MG capsule Take 50 mg by mouth at bedtime.    . Vitamin D, Ergocalciferol, (DRISDOL) 50000 units CAPS capsule Take 50,000 Units by mouth every 14 (fourteen) days. Fridays    . zinc gluconate 50 MG tablet Take 50 mg by mouth once a week. Take with vitamin d     Current Facility-Administered Medications  Medication Dose Route Frequency Provider Last Rate Last Dose  . cyanocobalamin ((VITAMIN B-12)) injection 1,000 mcg  1,000 mcg Intramuscular Q30 days [name], MD   1,000 mcg at 10/02/16 1545    Physical Exam: Vitals:   06/19/17 1355  BP: 118/62  Pulse: (!) 51  Weight: 231 lb (104.8 kg)  Height: 5\' 10"  (1.778 m)    GEN- The patient is well appearing, alert and oriented x 3 today.   Head- normocephalic, atraumatic Eyes-  Sclera clear, conjunctiva pink Ears- hearing intact Oropharynx- clear Lungs- Clear to ausculation bilaterally, normal work of breathing Heart- Regular rate and rhythm, no murmurs, rubs or gallops, PMI not laterally displaced GI-  soft, NT, ND, + BS Extremities- no clubbing, cyanosis, or edema Psych- flat affect  Wt Readings from Last 3 Encounters:  06/19/17 231 lb (104.8 kg)  05/19/17 238 lb (108 kg)  05/07/17 237 lb (107.5 kg)    EKG tracing ordered today is personally reviewed and shows sinus rhythm 51 bpm, PR 184 msec, QRS 106, Qtc 409 msec  Assessment and Plan:  1. Palpitations Resolved with ETOH avoidance  2. ETOh  Cessation advised  3. HTN Stable No change required today  4. Overweight Body mass index is 33.15 kg/m. Wt Readings from Last 3 Encounters:  06/19/17 231 lb (104.8 kg)  05/19/17 238 lb (108 kg)  05/07/17 237 lb (107.5 kg)  lifestyle  modification encouraged  Return to see EP PA in 6 months  [name] MD, [facility] 06/19/2017 2:37 PM

## 2017-06-19 NOTE — Patient Instructions (Addendum)
Medication Instructions:  Your physician recommends that you continue on your current medications as directed. Please refer to the Current Medication list given to you today.  Labwork: None ordered.  Testing/Procedures: None ordered.  Follow-Up: Your physician wants you to follow-up in: 6 months with Renee Ursuy, PA. You will receive a reminder letter in the mail two months in advance. If you don't receive a letter, please call our office to schedule the follow-up appointment.   Any Other Special Instructions Will Be Listed Below (If Applicable).     If you need a refill on your cardiac medications before your next appointment, please call your pharmacy.  

## 2017-07-02 ENCOUNTER — Other Ambulatory Visit (HOSPITAL_COMMUNITY): Payer: Self-pay | Admitting: Nurse Practitioner

## 2017-08-03 ENCOUNTER — Ambulatory Visit: Payer: Medicaid Other | Admitting: Pediatrics

## 2017-08-03 ENCOUNTER — Encounter: Payer: Self-pay | Admitting: Pediatrics

## 2017-08-03 VITALS — BP 113/73 | HR 65 | Temp 97.9°F | Ht 70.0 in | Wt 234.0 lb

## 2017-08-03 DIAGNOSIS — F329 Major depressive disorder, single episode, unspecified: Secondary | ICD-10-CM

## 2017-08-03 DIAGNOSIS — R4589 Other symptoms and signs involving emotional state: Secondary | ICD-10-CM

## 2017-08-03 DIAGNOSIS — E538 Deficiency of other specified B group vitamins: Secondary | ICD-10-CM

## 2017-08-03 DIAGNOSIS — E039 Hypothyroidism, unspecified: Secondary | ICD-10-CM | POA: Diagnosis not present

## 2017-08-03 NOTE — Progress Notes (Signed)
  Subjective:   Patient ID: [name], male    DOB: 1960/03/14, 57 y.o.   MRN: [id number] CC: Medical Management of Chronic Issues  HPI: DEVELL PARKERSON is a 57 y.o. 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  Due today.  Symptoms stable.  Elevated TSH: At times has had slightly elevated TSH.  He says his mood is been down over the last few weeks.  He has not shaved regarding her cut is usually would.  He says he feels safe at home.  No thoughts of self-harm.  Not interested in medication for treatment of depression or counseling at this time.    Recent colonoscopy, next due in 3 years.  Relevant past medical, surgical, family and social history reviewed. Allergies and medications reviewed and updated. Social History   Tobacco Use  Smoking Status Former Smoker  . Last attempt to quit: 03/03/1979  . Years since quitting: 38.4  Smokeless Tobacco Never Used   ROS: Per HPI   Objective:    BP 113/73   Pulse 65   Temp 97.9 F (36.6 C) (Oral)   Ht 5\' 10"  (1.778 m)   Wt 234 lb (106.1 kg)   BMI 33.58 kg/m   Wt Readings from Last 3 Encounters:  08/03/17 234 lb (106.1 kg)  06/19/17 231 lb (104.8 kg)  05/19/17 238 lb (108 kg)    Gen: NAD, alert, cooperative with exam, NCAT EYES: EOMI, no conjunctival injection, or no icterus ENT:   OP without erythema LYMPH: no cervical LAD CV: NRRR, normal S1/S2, no murmur, distal pulses 2+ b/l Resp: CTABL, no wheezes, normal WOB Abd: +BS, soft, NTND. no guarding or organomegaly Ext: No edema, warm Neuro: Alert and oriented MSK: normal muscle bulk  Assessment & Plan:  Riaan was seen today for medical management of chronic issues.  Diagnoses and all orders for this visit:  Hypothyroidism, unspecified type Recheck TSH, not currently on medicines. -     TSH -     T4, Free  Vitamin B12 deficiency B12 injection today. -     Vitamin B12  Depressed mood Discussed options.   Patient wants to wait before starting medication counseling.  Let me know if symptoms worsen.  Follow up plan: Return in about 6 months (around 02/03/2018). [name], MD [place]

## 2017-08-04 LAB — T4, FREE: Free T4: 0.89 ng/dL (ref 0.82–1.77)

## 2017-08-04 LAB — VITAMIN B12: Vitamin B-12: >2000 pg/mL — ABNORMAL HIGH (ref 232–1245)

## 2017-08-04 LAB — TSH: TSH: 4.01 u[IU]/mL (ref 0.450–4.500)

## 2017-09-04 ENCOUNTER — Ambulatory Visit (INDEPENDENT_AMBULATORY_CARE_PROVIDER_SITE_OTHER): Payer: Medicaid Other | Admitting: *Deleted

## 2017-09-04 DIAGNOSIS — E538 Deficiency of other specified B group vitamins: Secondary | ICD-10-CM

## 2017-09-04 NOTE — Progress Notes (Signed)
Pt given Cyanocobalamin inj Tolerated well 

## 2017-10-05 ENCOUNTER — Other Ambulatory Visit (INDEPENDENT_AMBULATORY_CARE_PROVIDER_SITE_OTHER): Payer: Self-pay | Admitting: *Deleted

## 2017-10-05 ENCOUNTER — Telehealth (INDEPENDENT_AMBULATORY_CARE_PROVIDER_SITE_OTHER): Payer: Self-pay | Admitting: Internal Medicine

## 2017-10-05 DIAGNOSIS — Z85038 Personal history of other malignant neoplasm of large intestine: Secondary | ICD-10-CM

## 2017-10-05 NOTE — Telephone Encounter (Signed)
Patient called stated he had a colonoscopy in April 2019 - stated he was told then he should received a letter from [place] in about 4 months - stated he has received anything yet - please call [phone]

## 2017-10-05 NOTE — Telephone Encounter (Signed)
Patient was to have a lab work in July. A new order has been done , he may go today or tomorrow and have it drawn. Would you please call the patient.

## 2017-10-06 ENCOUNTER — Ambulatory Visit (INDEPENDENT_AMBULATORY_CARE_PROVIDER_SITE_OTHER): Payer: Medicaid Other | Admitting: *Deleted

## 2017-10-06 DIAGNOSIS — E538 Deficiency of other specified B group vitamins: Secondary | ICD-10-CM

## 2017-10-06 NOTE — Progress Notes (Signed)
Pt given Cyanocobalamin inj Tolerated well 

## 2017-10-07 LAB — CEA

## 2017-10-08 ENCOUNTER — Other Ambulatory Visit (INDEPENDENT_AMBULATORY_CARE_PROVIDER_SITE_OTHER): Payer: Self-pay | Admitting: *Deleted

## 2017-10-08 DIAGNOSIS — Z85038 Personal history of other malignant neoplasm of large intestine: Secondary | ICD-10-CM

## 2017-10-08 NOTE — Progress Notes (Signed)
Per Dr.Rehman the patient will need CEA in 3 months and a Colonoscopy Surveillance in 3 years , that is from April 2019.  CEA noted for 3 months , 01/07/2018.

## 2017-10-27 ENCOUNTER — Telehealth: Payer: Self-pay | Admitting: Pediatrics

## 2017-10-27 NOTE — Telephone Encounter (Signed)
Attempted to return phone call- NVM

## 2017-10-28 NOTE — Telephone Encounter (Signed)
No answer.  Attempts  to reach patient will be done again later.

## 2017-10-28 NOTE — Telephone Encounter (Signed)
He is on two BP meds, cartia/diltiazem 120mg  once a day and losartan 100mg  once a day. Is he having side effects from these medicines? If BPs have been more elevated recently he should be seen in clinic.

## 2017-11-06 ENCOUNTER — Ambulatory Visit (INDEPENDENT_AMBULATORY_CARE_PROVIDER_SITE_OTHER): Payer: Medicaid Other | Admitting: *Deleted

## 2017-11-06 DIAGNOSIS — E538 Deficiency of other specified B group vitamins: Secondary | ICD-10-CM

## 2017-11-06 NOTE — Progress Notes (Signed)
Pt given Cyanocobalamin inj Tolerated well 

## 2017-11-10 ENCOUNTER — Encounter: Payer: Self-pay | Admitting: Physician Assistant

## 2017-11-29 NOTE — Progress Notes (Deleted)
Cardiology Office Note Date:  11/29/2017  Patient ID:  Earl, [name] [birth year], MRN [id number] PCP:  [name], MD  Cardiologist:  Dr. Bronson Ing Electrophysiologist: [name]  ***refresh   Chief Complaint: planned f/u  History of Present Illness: Earl [name] is a 57 y.o. male with history of HTN, AFlutter (s/p ablation 2018), and ETOH abuse.  He comes in today to be seen for [name], last seen by him in May of this year.  At that visit was doing well, palpitations had reduced with reduction in ETOH, cessation was advised, no changes to medicines were made.  *** palps *** ETOH *** meds *** labs   Past Medical History:  Diagnosis Date  . Alcohol abuse   . Colon cancer ([place])   . Depression   . Gall stones   . Hypertension   . Kidney stone   . Typical atrial flutter [facility])     Past Surgical History:  Procedure Laterality Date  . A-FLUTTER ABLATION N/A 07/31/2016   Procedure: A-Flutter Ablation;  Surgeon: [name], MD;  Location: [place] [facility];  Service: Cardiovascular;  Laterality: N/A;  . COLONOSCOPY N/A 04/17/2016   Procedure: COLONOSCOPY;  Surgeon: [name], MD;  [facility];  Service: Endoscopy;  Laterality: N/A;  1200  . COLONOSCOPY N/A 05/07/2017   Procedure: COLONOSCOPY;  Surgeon: [name], MD;  [facility];  Service: Endoscopy;  Laterality: N/A;  200  . MINOR CARPAL TUNNEL     right  . POLYPECTOMY  04/17/2016   Procedure: POLYPECTOMY;  Surgeon: [name], MD;  [facility];  Service: Endoscopy;;  sigmoid    Current Outpatient Medications  Medication Sig Dispense Refill  . ALPRAZolam (XANAX) 1 MG tablet Take 1 mg by mouth 2 (two) times daily as needed for anxiety.     Marland Kitchen CARTIA XT 120 MG 24 hr capsule TAKE 1 CAPSULE BY MOUTH ONCE DAILY 30 capsule 6  . DULoxetine (CYMBALTA) 60 MG capsule Take 60 mg by mouth daily.  2  . ketoconazole (NIZORAL) 2 % shampoo Apply 1 application  topically once a week.    . linaclotide (LINZESS) 72 MCG capsule Take 72 mcg by mouth daily as needed (constipation).    Marland Kitchen losartan (COZAAR) 100 MG tablet Take 1 tablet (100 mg total) by mouth daily. 90 tablet 3  . LYRICA 200 MG capsule Take 200 mg by mouth 2 (two) times daily.  2  . pravastatin (PRAVACHOL) 40 MG tablet Take 1 tablet (40 mg total) by mouth daily. 90 tablet 3  . topiramate (TOPAMAX) 25 MG capsule Take 50 mg by mouth at bedtime.    . Vitamin D, Ergocalciferol, (DRISDOL) 50000 units CAPS capsule Take 50,000 Units by mouth every 14 (fourteen) days. Fridays    . zinc gluconate 50 MG tablet Take 50 mg by mouth once a week. Take with vitamin d     Current Facility-Administered Medications  Medication Dose Route Frequency Provider Last Rate Last Dose  . cyanocobalamin ((VITAMIN B-12)) injection 1,000 mcg  1,000 mcg Intramuscular Q30 days [name], MD   1,000 mcg at 11/06/17 1445    Allergies:   Amitriptyline; Amoxicillin; Asa [aspirin]; Clonidine derivatives; Hydrochlorothiazide; and Lisinopril   Social History:  The patient  reports that he quit smoking about 38 years ago. He has never used smokeless tobacco. He reports that he does not drink alcohol or use drugs.   Family History:  [name] family history includes Alzheimer's disease in his mother; Coronary artery disease in his father and paternal grandfather; Heart disease in his father.  ROS:  Please see the history of present illness.  All other systems are reviewed and otherwise negative.   PHYSICAL EXAM: *** VS:  There were no vitals taken for this visit. BMI: There is no height or weight on file to calculate BMI. Well nourished, well developed, in no acute distress  HEENT: normocephalic, atraumatic  Neck: no JVD, carotid bruits or masses Cardiac:  *** RRR; no significant murmurs, no rubs, or gallops Lungs:  *** CTA b/l, no wheezing, rhonchi or rales  Abd: soft, nontender MS: no deformity or ***  atrophy Ext: *** o edema  Skin: warm and dry, no rash Neuro:  No gross deficits appreciated Psych: euthymic mood, full affect   EKG:  Not done today  07/31/16: EPS/ablation CONCLUSIONS:  1. Sinus rhythm upon presentation.  2. The clinical tachycardia was ectopic atrial tachycardia successfully ablated at the 8 oclock position along the tricuspid valve annulus.   3. Successful CTI ablation 4. No inducible arrhythmias following ablation.  5. No early apparent complications.   06/17/16: TTE Study Conclusions - Left ventricle: The cavity size was normal. Wall thickness was   normal. Systolic function was normal. The estimated ejection   fraction was in the range of 55% to 60%. Wall motion was normal;   there were no regional wall motion abnormalities. Indeterminate   diastolic function.  Recent Labs: 03/06/2017: BUN 18; Creatinine, Ser 0.96; Hemoglobin 15.8; Magnesium 1.7; Platelets 207; Potassium 3.8; Sodium 140 08/03/2017: TSH 4.010  02/10/2017: Chol/HDL Ratio 4.8; Cholesterol, Total 221; HDL 46; LDL Calculated 136; Triglycerides 195   CrCl cannot be calculated (Patient's most recent lab result is older than the maximum 21 days allowed.).   Wt Readings from Last 3 Encounters:  08/03/17 234 lb (106.1 kg)  06/19/17 231 lb (104.8 kg)  05/19/17 238 lb (108 kg)     Other studies reviewed: Additional studies/records reviewed today include: summarized above  ASSESSMENT AND PLAN:  1. AFlutter     S/p ablation July 2018, no longer on a/c  2. Palpitations     *** improved with ETOH reduction  3. HTN     ***    Disposition: F/u with ***  Current medicines are reviewed at length with the patient today.  The patient did not have any concerns regarding medicines.***  Signed, Tommye Standard, PA-C 11/29/2017 8:28 AM     [facility] [address] [place] [address] [phone] (office)  [phone] (fax)

## 2017-11-30 ENCOUNTER — Ambulatory Visit: Payer: Medicaid Other | Admitting: Physician Assistant

## 2017-11-30 NOTE — Progress Notes (Signed)
Cardiology Office Note Date:  12/01/2017  Patient ID:  Earl [name], Earl [name] [birth year], MRN [id number] PCP:  [name], MD  Cardiologist:  Dr. Bronson Ing Electrophysiologist: [name]   Chief Complaint: planned f/u  History of Present Illness: Earl [name] is a 57 y.o. male with history of HTN, AFlutter (s/p ablation 2018), and ETOH abuse.  He comes in today to be seen for [name], last seen by him in May of this year.  At that visit was doing well, palpitations had reduced with reduction in ETOH, cessation was advised, no changes to medicines were made.  He reports significant frustration with his weight gain.  Mentions when able to exercise he is able to get it off though with the summer being so hot was not able to do much, and his peripheral neuropathy linitis duration of his exercise as well. He reports eating well.  AT least thinks he is making good food choices.  He denies any CP, palpitations or SOB, no dizziness, near syncope or syncope. He denies smoking, no longer drinking.   Past Medical History:  Diagnosis Date  . Alcohol abuse   . Colon cancer ([place])   . Depression   . Gall stones   . Hypertension   . Kidney stone   . Typical atrial flutter [facility])     Past Surgical History:  Procedure Laterality Date  . A-FLUTTER ABLATION N/A 07/31/2016   Procedure: A-Flutter Ablation;  Surgeon: [name], MD;  Location: [place] [facility];  Service: Cardiovascular;  Laterality: N/A;  . COLONOSCOPY N/A 04/17/2016   Procedure: COLONOSCOPY;  Surgeon: [name], MD;  [facility];  Service: Endoscopy;  Laterality: N/A;  1200  . COLONOSCOPY N/A 05/07/2017   Procedure: COLONOSCOPY;  Surgeon: [name], MD;  [facility];  Service: Endoscopy;  Laterality: N/A;  200  . MINOR CARPAL TUNNEL     right  . POLYPECTOMY  04/17/2016   Procedure: POLYPECTOMY;  Surgeon: [name], MD;  [facility];  Service: Endoscopy;;  sigmoid      Current Outpatient Medications  Medication Sig Dispense Refill  . ALPRAZolam (XANAX) 1 MG tablet Take 1 mg by mouth 2 (two) times daily as needed for anxiety.     Marland Kitchen diltiazem (CARTIA XT) 120 MG 24 hr capsule Take 1 capsule (120 mg total) by mouth daily. 90 capsule 3  . DULoxetine (CYMBALTA) 60 MG capsule Take 60 mg by mouth daily.  2  . ketoconazole (NIZORAL) 2 % shampoo Apply 1 application topically once a week.    . linaclotide (LINZESS) 72 MCG capsule Take 72 mcg by mouth daily as needed (constipation).    Marland Kitchen losartan (COZAAR) 100 MG tablet Take 1 tablet (100 mg total) by mouth daily. 90 tablet 3  . LYRICA 200 MG capsule Take 200 mg by mouth 2 (two) times daily.  2  . pravastatin (PRAVACHOL) 40 MG tablet Take 1 tablet (40 mg total) by mouth daily. 90 tablet 3  . topiramate (TOPAMAX) 25 MG capsule Take 50 mg by mouth at bedtime.    . Vitamin D, Ergocalciferol, (DRISDOL) 50000 units CAPS capsule Take 50,000 Units by mouth every 14 (fourteen) days. Fridays    . zinc gluconate 50 MG tablet Take 50 mg by mouth once a week. Take with vitamin d     Current Facility-Administered Medications  Medication Dose Route Frequency Provider Last Rate Last Dose  . cyanocobalamin ((VITAMIN B-12)) injection  1,000 mcg  1,000 mcg Intramuscular Q30 days [name], MD   1,000 mcg at 11/06/17 1445    Allergies:   Amitriptyline; Amoxicillin; Asa [aspirin]; Clonidine derivatives; Hydrochlorothiazide; and Lisinopril   Social History:  The patient  reports that he quit smoking about 38 years ago. He has never used smokeless tobacco. He reports that he does not drink alcohol or use drugs.   Family History:  [name] family history includes Alzheimer's disease in his mother; Coronary artery disease in his father and paternal grandfather; Heart disease in his father.  ROS:  Please see the history of present illness.  All other systems are reviewed and otherwise negative.   PHYSICAL EXAM:  VS:  BP  129/89   Pulse 66   Ht 5\' 10"  (1.778 m)   Wt 250 lb (113.4 kg)   BMI 35.87 kg/m  BMI: Body mass index is 35.87 kg/m. Well nourished, well developed, in no acute distress  HEENT: normocephalic, atraumatic  Neck: no JVD, carotid bruits or masses Cardiac:  RRR; no significant murmurs, no rubs, or gallops Lungs:  CTA b/l, no wheezing, rhonchi or rales  Abd: soft, nontender MS: no deformity or atrophy Ext: No edema  Skin: warm and dry, no rash Neuro:  No gross deficits appreciated Psych: euthymic mood, full affect   EKG:  Not done today  07/31/16: EPS/ablation CONCLUSIONS:  1. Sinus rhythm upon presentation.  2. The clinical tachycardia was ectopic atrial tachycardia successfully ablated at the 8 oclock position along the tricuspid valve annulus.   3. Successful CTI ablation 4. No inducible arrhythmias following ablation.  5. No early apparent complications.   06/17/16: TTE Study Conclusions - Left ventricle: The cavity size was normal. Wall thickness was   normal. Systolic function was normal. The estimated ejection   fraction was in the range of 55% to 60%. Wall motion was normal;   there were no regional wall motion abnormalities. Indeterminate   diastolic function.  Recent Labs: 03/06/2017: BUN 18; Creatinine, Ser 0.96; Hemoglobin 15.8; Magnesium 1.7; Platelets 207; Potassium 3.8; Sodium 140 08/03/2017: TSH 4.010  02/10/2017: Chol/HDL Ratio 4.8; Cholesterol, Total 221; HDL 46; LDL Calculated 136; Triglycerides 195   CrCl cannot be calculated (Patient's most recent lab result is older than the maximum 21 days allowed.).   Wt Readings from Last 3 Encounters:  12/01/17 250 lb (113.4 kg)  08/03/17 234 lb (106.1 kg)  06/19/17 231 lb (104.8 kg)     Other studies reviewed: Additional studies/records reviewed today include: summarized above  ASSESSMENT AND PLAN:  1. AFlutter     S/p ablation July 2018, no longer on a/c  2. Palpitations     Resolved      Denies ETOH  3.  HTN    He reports typically not quite as high as today 4. Obesity     We discussed weight loss strategies at length.  Food choices and alternatives to traditional exercise such as stationary pedals he can do at home, and some light weights for UE exercising to do along with pedaling.  He is given information on the monthly free weight loss/nutrition class as well.     He seemed motivated and receptive to the information   Disposition: F/u in 6 months sooner if needed  Current medicines are reviewed at length with the patient today.  The patient did not have any concerns regarding medicines.  Venetia Night, PA-C 12/01/2017 4:15 PM     [facility]  [place] [address] [phone] (office)  [phone] (fax)

## 2017-12-01 ENCOUNTER — Ambulatory Visit: Payer: Medicaid Other | Admitting: Physician Assistant

## 2017-12-01 DIAGNOSIS — I10 Essential (primary) hypertension: Secondary | ICD-10-CM

## 2017-12-01 MED ORDER — DILTIAZEM HCL ER COATED BEADS 120 MG PO CP24: 120.0000 mg | ORAL_CAPSULE | Freq: Every day | ORAL | 3 refills | Status: DC

## 2017-12-01 MED ORDER — LOSARTAN POTASSIUM 100 MG PO TABS: 100.0000 mg | ORAL_TABLET | Freq: Every day | ORAL | 3 refills | Status: DC

## 2017-12-01 NOTE — Patient Instructions (Signed)
Medication Instructions:   Your physician recommends that you continue on your current medications as directed. Please refer to the Current Medication list given to you today.   If you need a refill on your cardiac medications before your next appointment, please call your pharmacy.  Labwork: NONE ORDERED  TODAY    Testing/Procedures: NONE ORDERED  TODAY    Follow-Up: Your physician wants you to follow-up in:  IN  6  MONTHS WITH URSUY You will receive a reminder letter in the mail two months in advance. If you don't receive a letter, please call our office to schedule the follow-up appointment.      Any Other Special Instructions Will Be Listed Below (If Applicable).                                                                                                                                                   

## 2017-12-08 ENCOUNTER — Ambulatory Visit: Payer: Medicaid Other

## 2017-12-09 ENCOUNTER — Ambulatory Visit (INDEPENDENT_AMBULATORY_CARE_PROVIDER_SITE_OTHER): Payer: Medicaid Other | Admitting: *Deleted

## 2017-12-09 DIAGNOSIS — E538 Deficiency of other specified B group vitamins: Secondary | ICD-10-CM

## 2017-12-09 NOTE — Progress Notes (Signed)
Pt given Cyanocobalamin inj Tolerated well 

## 2017-12-16 ENCOUNTER — Encounter (INDEPENDENT_AMBULATORY_CARE_PROVIDER_SITE_OTHER): Payer: Self-pay | Admitting: *Deleted

## 2017-12-16 ENCOUNTER — Other Ambulatory Visit (INDEPENDENT_AMBULATORY_CARE_PROVIDER_SITE_OTHER): Payer: Self-pay | Admitting: *Deleted

## 2017-12-16 DIAGNOSIS — Z85038 Personal history of other malignant neoplasm of large intestine: Secondary | ICD-10-CM

## 2018-01-08 LAB — CEA

## 2018-01-11 ENCOUNTER — Other Ambulatory Visit (INDEPENDENT_AMBULATORY_CARE_PROVIDER_SITE_OTHER): Payer: Self-pay | Admitting: *Deleted

## 2018-01-11 DIAGNOSIS — Z1159 Encounter for screening for other viral diseases: Secondary | ICD-10-CM

## 2018-01-11 DIAGNOSIS — Z85038 Personal history of other malignant neoplasm of large intestine: Secondary | ICD-10-CM

## 2018-01-11 DIAGNOSIS — F1011 Alcohol abuse, in remission: Secondary | ICD-10-CM

## 2018-02-03 ENCOUNTER — Encounter: Payer: Self-pay | Admitting: Pediatrics

## 2018-02-03 ENCOUNTER — Ambulatory Visit: Payer: Medicaid Other | Admitting: Pediatrics

## 2018-02-03 ENCOUNTER — Other Ambulatory Visit: Payer: Self-pay | Admitting: Pediatrics

## 2018-02-03 VITALS — BP 104/72 | HR 77 | Temp 96.7°F | Ht 70.0 in | Wt 251.0 lb

## 2018-02-03 DIAGNOSIS — I10 Essential (primary) hypertension: Secondary | ICD-10-CM | POA: Diagnosis not present

## 2018-02-03 DIAGNOSIS — E538 Deficiency of other specified B group vitamins: Secondary | ICD-10-CM

## 2018-02-03 DIAGNOSIS — E559 Vitamin D deficiency, unspecified: Secondary | ICD-10-CM | POA: Diagnosis not present

## 2018-02-03 DIAGNOSIS — E785 Hyperlipidemia, unspecified: Secondary | ICD-10-CM

## 2018-02-03 DIAGNOSIS — Z1159 Encounter for screening for other viral diseases: Secondary | ICD-10-CM

## 2018-02-03 MED ORDER — PRAVASTATIN SODIUM 40 MG PO TABS: 40.0000 mg | ORAL_TABLET | Freq: Every day | ORAL | 3 refills | Status: DC

## 2018-02-03 MED ORDER — LOSARTAN POTASSIUM 100 MG PO TABS: 100.0000 mg | ORAL_TABLET | Freq: Every day | ORAL | 3 refills | Status: DC

## 2018-02-03 NOTE — Progress Notes (Signed)
  Subjective:   Patient ID: Earl [name], male    DOB: 05-08-1960, 58 y.o.   MRN: [id number] CC: Follow-up multiple medical problems HPI: [name] is a 58 y.o. male   Vitamin D deficiency: Has been taking once weekly vitamin D 50,000 IU.  Several months ago he started taking it once a month or once every other month.  Vitamin B12 deficiency: Was getting monthly injections of vitamin B12.  Last injection was 2 months ago.  Hypertension: Taking medicine regularly.  No lightheadedness, chest pain, shortness of breath.  Hyperlipidemia: Tolerating statin, no side effects  Relevant past medical, surgical, family and social history reviewed. Allergies and medications reviewed and updated. Social History   Tobacco Use  Smoking Status Former Smoker  . Last attempt to quit: 03/03/1979  . Years since quitting: 38.9  Smokeless Tobacco Never Used   ROS: Per HPI   Objective:    BP 104/72   Pulse 77   Temp (!) 96.7 F (35.9 C) (Oral)   Ht 5' 10" (1.778 m)   Wt 251 lb (113.9 kg)   BMI 36.01 kg/m   Wt Readings from Last 3 Encounters:  02/03/18 251 lb (113.9 kg)  12/01/17 250 lb (113.4 kg)  08/03/17 234 lb (106.1 kg)    Gen: NAD, alert, cooperative with exam, NCAT EYES:  no conjunctival injection, or no icterus ENT:  OP without erythema LYMPH: no cervical LAD CV: NRRR, normal S1/S2, no murmur, distal pulses 2+ b/l Resp: CTABL, no wheezes, normal WOB Abd: +BS, soft, NTND.  Ext: No edema, warm Neuro: Alert and oriented MSK: normal muscle bulk  Assessment & Plan:  58 year old male here for medical problem follow-up.  Diagnoses and all orders for this visit:  Hyperlipidemia, unspecified hyperlipidemia type Stable, continue below -     pravastatin (PRAVACHOL) 40 MG tablet; Take 1 tablet (40 mg total) by mouth daily. -     Hepatic Function Panel  Essential hypertension Stable, continue below.  Any lightheadedness let me know. -     losartan (COZAAR) 100 MG tablet; Take 1  tablet (100 mg total) by mouth daily. -     BMP8+EGFR  Encounter for hepatitis C screening test for low risk patient -     Hepatitis C antibody  Vitamin B12 deficiency Should restart taking oral over-the-counter vitamin B-12.  Will recheck level today.  Will need repeat lab in the next couple months, may need to return to IM replacement.  Has had severe deficiency in the past. -     Vitamin B12  Vitamin D deficiency Restart taking daily over-the-counter vitamin D -     VITAMIN D 25 Hydroxy (Vit-D Deficiency, Fractures)   Follow up plan: Return in about 6 months (around 08/04/2018). [name], MD [place]

## 2018-02-04 LAB — BMP8+EGFR
BUN/Creatinine Ratio: 12 (ref 9–20)
BUN: 12 mg/dL (ref 6–24)
CALCIUM: 8.4 mg/dL — AB (ref 8.7–10.2)
CHLORIDE: 105 mmol/L (ref 96–106)
CO2: 22 mmol/L (ref 20–29)
Creatinine, Ser: 1 mg/dL (ref 0.76–1.27)
GFR calc Af Amer: 95 mL/min/{1.73_m2} (ref >59)
GFR calc non Af Amer: 83 mL/min/{1.73_m2} (ref >59)
Glucose: 90 mg/dL (ref 65–99)
POTASSIUM: 3.9 mmol/L (ref 3.5–5.2)
Sodium: 141 mmol/L (ref 134–144)

## 2018-02-04 LAB — HEPATIC FUNCTION PANEL
ALT: 53 IU/L — ABNORMAL HIGH (ref 0–44)
AST: 81 IU/L — ABNORMAL HIGH (ref 0–40)
Albumin: 4 g/dL (ref 3.5–5.5)
Alkaline Phosphatase: 112 IU/L (ref 39–117)
Bilirubin Total: 0.5 mg/dL (ref 0.0–1.2)
Bilirubin, Direct: 0.21 mg/dL (ref 0.00–0.40)
Total Protein: 6.5 g/dL (ref 6.0–8.5)

## 2018-02-04 LAB — VITAMIN B12: Vitamin B-12: 582 pg/mL (ref 232–1245)

## 2018-02-04 LAB — VITAMIN D 25 HYDROXY (VIT D DEFICIENCY, FRACTURES): VIT D 25 HYDROXY: 45.5 ng/mL (ref 30.0–100.0)

## 2018-02-04 LAB — HEPATITIS C ANTIBODY

## 2018-03-17 ENCOUNTER — Ambulatory Visit: Payer: Medicaid Other

## 2018-03-17 DIAGNOSIS — E538 Deficiency of other specified B group vitamins: Secondary | ICD-10-CM | POA: Diagnosis not present

## 2018-04-08 ENCOUNTER — Other Ambulatory Visit (INDEPENDENT_AMBULATORY_CARE_PROVIDER_SITE_OTHER): Payer: Self-pay | Admitting: *Deleted

## 2018-04-08 ENCOUNTER — Telehealth (INDEPENDENT_AMBULATORY_CARE_PROVIDER_SITE_OTHER): Payer: Self-pay | Admitting: Internal Medicine

## 2018-04-08 DIAGNOSIS — Z85038 Personal history of other malignant neoplasm of large intestine: Secondary | ICD-10-CM

## 2018-04-08 DIAGNOSIS — Z1159 Encounter for screening for other viral diseases: Secondary | ICD-10-CM

## 2018-04-08 DIAGNOSIS — F1011 Alcohol abuse, in remission: Secondary | ICD-10-CM

## 2018-04-08 NOTE — Telephone Encounter (Signed)
Patient called regarding lab work - states it seems like it has been a while since he had lab work done  -  Ph# [phone]

## 2018-04-08 NOTE — Telephone Encounter (Signed)
Patient is to have labs 04/09/2018. He was called and made aware. He plans to go tomorrow.

## 2018-04-09 DIAGNOSIS — Z85038 Personal history of other malignant neoplasm of large intestine: Secondary | ICD-10-CM | POA: Diagnosis not present

## 2018-04-12 LAB — HEPATITIS C ANTIBODY
Hepatitis C Ab: NONREACTIVE
SIGNAL TO CUT-OFF: 0.02 (ref <1.00)

## 2018-04-12 LAB — HEPATIC FUNCTION PANEL
AG Ratio: 1.7 (calc) (ref 1.0–2.5)
ALT: 49 U/L — ABNORMAL HIGH (ref 9–46)
AST: 79 U/L — ABNORMAL HIGH (ref 10–35)
Albumin: 4.1 g/dL (ref 3.6–5.1)
Alkaline phosphatase (APISO): 73 U/L (ref 35–144)
Bilirubin, Direct: 0.1 mg/dL (ref 0.0–0.2)
Globulin: 2.4 g/dL (calc) (ref 1.9–3.7)
Indirect Bilirubin: 0.6 mg/dL (calc) (ref 0.2–1.2)
Total Bilirubin: 0.7 mg/dL (ref 0.2–1.2)
Total Protein: 6.5 g/dL (ref 6.1–8.1)

## 2018-04-12 LAB — CEA: CEA: <0.5 ng/mL

## 2018-04-16 ENCOUNTER — Ambulatory Visit: Payer: Medicaid Other

## 2018-04-19 ENCOUNTER — Other Ambulatory Visit (INDEPENDENT_AMBULATORY_CARE_PROVIDER_SITE_OTHER): Payer: Self-pay | Admitting: *Deleted

## 2018-04-19 DIAGNOSIS — R7401 Elevation of levels of liver transaminase levels: Secondary | ICD-10-CM

## 2018-04-19 DIAGNOSIS — R740 Nonspecific elevation of levels of transaminase and lactic acid dehydrogenase [LDH]: Secondary | ICD-10-CM

## 2018-04-19 DIAGNOSIS — Z85038 Personal history of other malignant neoplasm of large intestine: Secondary | ICD-10-CM

## 2018-04-19 NOTE — Progress Notes (Signed)
Earl [name] - a order has been put in for US Abdomen Complete.

## 2018-05-07 DIAGNOSIS — J111 Influenza due to unidentified influenza virus with other respiratory manifestations: Secondary | ICD-10-CM | POA: Diagnosis not present

## 2018-05-07 DIAGNOSIS — Z79899 Other long term (current) drug therapy: Secondary | ICD-10-CM | POA: Diagnosis not present

## 2018-05-07 DIAGNOSIS — B349 Viral infection, unspecified: Secondary | ICD-10-CM | POA: Diagnosis not present

## 2018-05-10 ENCOUNTER — Telehealth: Payer: Self-pay | Admitting: *Deleted

## 2018-05-10 ENCOUNTER — Telehealth (INDEPENDENT_AMBULATORY_CARE_PROVIDER_SITE_OTHER): Payer: Self-pay | Admitting: *Deleted

## 2018-05-10 DIAGNOSIS — R0602 Shortness of breath: Secondary | ICD-10-CM | POA: Diagnosis not present

## 2018-05-10 DIAGNOSIS — R52 Pain, unspecified: Secondary | ICD-10-CM | POA: Diagnosis not present

## 2018-05-10 DIAGNOSIS — R509 Fever, unspecified: Secondary | ICD-10-CM | POA: Diagnosis not present

## 2018-05-10 DIAGNOSIS — R51 Headache: Secondary | ICD-10-CM | POA: Diagnosis not present

## 2018-05-10 DIAGNOSIS — R05 Cough: Secondary | ICD-10-CM | POA: Diagnosis not present

## 2018-05-10 DIAGNOSIS — Z20828 Contact with and (suspected) exposure to other viral communicable diseases: Secondary | ICD-10-CM | POA: Diagnosis not present

## 2018-05-10 NOTE — Telephone Encounter (Signed)
Patient called and canceled [place] sch'd 05/17/18 - states he is being tested for COVID-19 and will call us once he is better

## 2018-05-10 NOTE — Telephone Encounter (Signed)
Pt called stating he was seen at [facility] urgent care on Friday Sxs include fever, SOB and achiness with headache,denies cough Sxs started Thursday night Was told to go to [facility] PCR building for testing Pt will contact [facility] for guidance on testing

## 2018-05-14 ENCOUNTER — Ambulatory Visit: Payer: Medicaid Other

## 2018-05-17 ENCOUNTER — Ambulatory Visit (HOSPITAL_COMMUNITY): Payer: Medicaid Other

## 2018-05-31 ENCOUNTER — Telehealth (INDEPENDENT_AMBULATORY_CARE_PROVIDER_SITE_OTHER): Payer: Self-pay | Admitting: Internal Medicine

## 2018-05-31 NOTE — Telephone Encounter (Signed)
Patient left message wanting to know when he would need to have lab work done again - please call ph# [phone]

## 2018-05-31 NOTE — Telephone Encounter (Signed)
Will address with Dr.Rehman . Patient had procedure April 11 , 2019. It appears that he may need office visit.

## 2018-06-08 ENCOUNTER — Other Ambulatory Visit (INDEPENDENT_AMBULATORY_CARE_PROVIDER_SITE_OTHER): Payer: Self-pay | Admitting: *Deleted

## 2018-06-08 ENCOUNTER — Telehealth (INDEPENDENT_AMBULATORY_CARE_PROVIDER_SITE_OTHER): Payer: Self-pay | Admitting: *Deleted

## 2018-06-08 DIAGNOSIS — Z85038 Personal history of other malignant neoplasm of large intestine: Secondary | ICD-10-CM

## 2018-06-08 DIAGNOSIS — R7989 Other specified abnormal findings of blood chemistry: Secondary | ICD-10-CM

## 2018-06-08 NOTE — Telephone Encounter (Signed)
Per Dr.Rehman the patient will have lab work 07/09/2018 , the Abdominal Ultrasound complete is to rescheduled for June. Patient will need to have office visit after both are complete.

## 2018-06-08 NOTE — Telephone Encounter (Signed)
Patient was called and made aware of Per Dr.Rehman the patient will have lab work 07/09/2018 , the Abdominal Ultrasound complete is to rescheduled for June. Patient will need to have office visit after both are complete.  He says that he is finally set up with [facility]. He will be looking there for information sent to him. Patient cannot accept phone calls until after 12 pm as he works second shift.

## 2018-06-09 ENCOUNTER — Encounter (INDEPENDENT_AMBULATORY_CARE_PROVIDER_SITE_OTHER): Payer: Self-pay

## 2018-06-09 NOTE — Telephone Encounter (Signed)
Info submitted for PA on [place], will schedule once that is rec'd, patient has been sent MyChart message letting him know status

## 2018-06-10 ENCOUNTER — Other Ambulatory Visit: Payer: Self-pay

## 2018-06-10 ENCOUNTER — Telehealth: Payer: Self-pay | Admitting: Family Medicine

## 2018-06-10 NOTE — Telephone Encounter (Signed)
Make him an appointment to see me

## 2018-06-10 NOTE — Telephone Encounter (Signed)
Pt is coming in on 5/14 to have his b12 inj and wants to know if he can have labs drawn to check his b12 and D3

## 2018-06-11 ENCOUNTER — Ambulatory Visit: Payer: Medicaid Other | Admitting: *Deleted

## 2018-06-11 ENCOUNTER — Other Ambulatory Visit: Payer: Self-pay

## 2018-06-11 DIAGNOSIS — E538 Deficiency of other specified B group vitamins: Secondary | ICD-10-CM

## 2018-06-14 ENCOUNTER — Encounter (INDEPENDENT_AMBULATORY_CARE_PROVIDER_SITE_OTHER): Payer: Self-pay

## 2018-06-16 ENCOUNTER — Encounter (INDEPENDENT_AMBULATORY_CARE_PROVIDER_SITE_OTHER): Payer: Self-pay | Admitting: *Deleted

## 2018-06-16 ENCOUNTER — Other Ambulatory Visit (INDEPENDENT_AMBULATORY_CARE_PROVIDER_SITE_OTHER): Payer: Self-pay | Admitting: *Deleted

## 2018-06-16 DIAGNOSIS — Z85038 Personal history of other malignant neoplasm of large intestine: Secondary | ICD-10-CM

## 2018-06-16 DIAGNOSIS — R7989 Other specified abnormal findings of blood chemistry: Secondary | ICD-10-CM

## 2018-06-18 NOTE — Telephone Encounter (Signed)
Has upcoming appt.  This encounter will now be closed

## 2018-06-22 DIAGNOSIS — R945 Abnormal results of liver function studies: Secondary | ICD-10-CM | POA: Diagnosis not present

## 2018-06-22 DIAGNOSIS — Z85038 Personal history of other malignant neoplasm of large intestine: Secondary | ICD-10-CM | POA: Diagnosis not present

## 2018-06-23 LAB — HEPATIC FUNCTION PANEL
AG Ratio: 1.7 (calc) (ref 1.0–2.5)
ALT: 98 U/L — ABNORMAL HIGH (ref 9–46)
AST: 157 U/L — ABNORMAL HIGH (ref 10–35)
Albumin: 4 g/dL (ref 3.6–5.1)
Alkaline phosphatase (APISO): 106 U/L (ref 35–144)
Bilirubin, Direct: 0.1 mg/dL (ref 0.0–0.2)
Globulin: 2.4 g/dL (calc) (ref 1.9–3.7)
Indirect Bilirubin: 0.5 mg/dL (calc) (ref 0.2–1.2)
Total Bilirubin: 0.6 mg/dL (ref 0.2–1.2)
Total Protein: 6.4 g/dL (ref 6.1–8.1)

## 2018-06-23 LAB — CEA: CEA: <0.5 ng/mL

## 2018-06-25 ENCOUNTER — Ambulatory Visit: Payer: Medicaid Other | Admitting: Family Medicine

## 2018-06-28 DIAGNOSIS — Z8614 Personal history of Methicillin resistant Staphylococcus aureus infection: Secondary | ICD-10-CM | POA: Diagnosis not present

## 2018-06-28 DIAGNOSIS — Z886 Allergy status to analgesic agent status: Secondary | ICD-10-CM | POA: Diagnosis not present

## 2018-06-28 DIAGNOSIS — R197 Diarrhea, unspecified: Secondary | ICD-10-CM | POA: Diagnosis not present

## 2018-06-28 DIAGNOSIS — R7989 Other specified abnormal findings of blood chemistry: Secondary | ICD-10-CM | POA: Diagnosis not present

## 2018-06-28 DIAGNOSIS — Z880 Allergy status to penicillin: Secondary | ICD-10-CM | POA: Diagnosis not present

## 2018-06-28 DIAGNOSIS — A045 Campylobacter enteritis: Secondary | ICD-10-CM | POA: Diagnosis not present

## 2018-06-28 DIAGNOSIS — Z79899 Other long term (current) drug therapy: Secondary | ICD-10-CM | POA: Diagnosis not present

## 2018-07-06 ENCOUNTER — Encounter (INDEPENDENT_AMBULATORY_CARE_PROVIDER_SITE_OTHER): Payer: Self-pay | Admitting: *Deleted

## 2018-07-12 ENCOUNTER — Other Ambulatory Visit: Payer: Self-pay

## 2018-07-13 ENCOUNTER — Ambulatory Visit: Payer: Medicaid Other

## 2018-07-15 ENCOUNTER — Ambulatory Visit (HOSPITAL_COMMUNITY)
Admission: RE | Admit: 2018-07-15 | Discharge: 2018-07-15 | Disposition: A | Discharge status: Home / Self Care | Payer: Medicaid Other | Source: Ambulatory Visit | Attending: Internal Medicine | Admitting: Internal Medicine

## 2018-07-15 ENCOUNTER — Other Ambulatory Visit: Payer: Self-pay

## 2018-07-15 ENCOUNTER — Ambulatory Visit (HOSPITAL_COMMUNITY): Payer: Medicaid Other

## 2018-07-15 DIAGNOSIS — C189 Malignant neoplasm of colon, unspecified: Secondary | ICD-10-CM | POA: Diagnosis not present

## 2018-07-15 DIAGNOSIS — R740 Nonspecific elevation of levels of transaminase and lactic acid dehydrogenase [LDH]: Secondary | ICD-10-CM | POA: Diagnosis not present

## 2018-07-15 DIAGNOSIS — R7401 Elevation of levels of liver transaminase levels: Secondary | ICD-10-CM

## 2018-07-15 DIAGNOSIS — Z85038 Personal history of other malignant neoplasm of large intestine: Secondary | ICD-10-CM | POA: Diagnosis not present

## 2018-07-19 ENCOUNTER — Encounter (INDEPENDENT_AMBULATORY_CARE_PROVIDER_SITE_OTHER): Payer: Self-pay

## 2018-07-29 ENCOUNTER — Ambulatory Visit (INDEPENDENT_AMBULATORY_CARE_PROVIDER_SITE_OTHER): Payer: Medicaid Other | Admitting: Internal Medicine

## 2018-07-29 ENCOUNTER — Other Ambulatory Visit: Payer: Self-pay

## 2018-07-29 VITALS — BP 132/90 | HR 78 | Temp 98.3°F | Resp 18 | Ht 70.0 in | Wt 260.6 lb

## 2018-07-29 DIAGNOSIS — R945 Abnormal results of liver function studies: Secondary | ICD-10-CM

## 2018-07-29 DIAGNOSIS — R7989 Other specified abnormal findings of blood chemistry: Secondary | ICD-10-CM

## 2018-07-29 NOTE — Progress Notes (Signed)
Presenting complaint;  Follow-up for elevated transaminases.  Database and subjective:  Patient is 58 year old Caucasian male who underwent screening colonoscopy in March 2018 with removal of 10 mm pedunculated polyp.  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  CEA and abdominal pelvic CT was normal.  Patient declined to undergo surgery.  He has been getting CEAs at regular intervals and they have been normal.  He had surveillance colonoscopy in April 2019 with removal of 2 small polyps and these were tubular adenomas. Patient had routine blood work by Dr. Eddie Dibbles in January this year and his transaminases are elevated.  His AST was 81 and ALT was 53.  Transaminases prior to that have always been normal. Transaminases in March but also elevated.  Hepatitis C antibody was negative.  Hepatitis B surface antigen result is not available.  He believes he has tested negative in the past. Ultrasound could not be done until recently.  Abe People has no complaints.  He states he had food poisoning few weeks ago and has fully recovered.  He states he has a great appetite.  He says his appetite never goes down.  He was walking regularly until his father got sick and unable to drive him to park etc. He tells me his father is 28 years old who was recently diagnosed with advanced cirrhosis when he presented with new onset of ascites.  His father had and never drank alcohol.  Patient also does not drink alcohol. Review of his weight records reveal that he weighed 215 pounds in March 2018 and 242 pounds in March 2019.  He now weighs 260 pounds. He denies abdominal pain pruritus melena rectal bleeding or diarrhea.   Current Medications: Outpatient Encounter Medications as of 07/29/2018  Medication Sig  . lisinopril (ZESTRIL) 40 MG tablet Take 40 mg by mouth daily.  . Vitamin D, Ergocalciferol, (DRISDOL) 50000 units CAPS capsule Take 50,000 Units by mouth every 30 (thirty) days.  Fridays  . ALPRAZolam (XANAX) 1 MG tablet Take 1 mg by mouth 2 (two) times daily as needed for anxiety.   Marland Kitchen ketoconazole (NIZORAL) 2 % shampoo Apply 1 application topically once a week.  . linaclotide (LINZESS) 72 MCG capsule Take 72 mcg by mouth daily as needed (constipation).  Marland Kitchen losartan (COZAAR) 100 MG tablet Take 1 tablet (100 mg total) by mouth daily.  Marland Kitchen LYRICA 200 MG capsule Take 200 mg by mouth 2 (two) times daily.  Marland Kitchen zinc gluconate 50 MG tablet Take 50 mg by mouth once a week. Take with vitamin d  . [DISCONTINUED] diltiazem (CARTIA XT) 120 MG 24 hr capsule Take 1 capsule (120 mg total) by mouth daily.  . [DISCONTINUED] DULoxetine (CYMBALTA) 60 MG capsule Take 60 mg by mouth daily.  . [DISCONTINUED] pravastatin (PRAVACHOL) 40 MG tablet Take 1 tablet (40 mg total) by mouth daily.  . [DISCONTINUED] topiramate (TOPAMAX) 25 MG capsule Take 50 mg by mouth at bedtime.   Facility-Administered Encounter Medications as of 07/29/2018  Medication  . cyanocobalamin ((VITAMIN B-12)) injection 1,000 mcg     Objective: Blood pressure 132/90, pulse 78, temperature 98.3, temperature source Oral, resp. rate 18, height _0  (1.778 m), weight 260 lb (118.19 kg), SpO2 100 %. Patient is wearing a mask. Patient is alert and in no acute distress. Conjunctiva is pink. Sclera is nonicteric Oropharyngeal mucosa is normal. No neck masses or thyromegaly noted. Cardiac exam with regular rhythm normal S1 and S2. No murmur or gallop noted. Lungs  are clear to auscultation. Abdomen is obese but soft and nontender with organomegaly or masses. He has trace edema around ankles.  Labs/studies Results:   CBC Latest Ref Rng & Units 03/06/2017 07/23/2016 06/08/2016  WBC 4.0 - 10.5 K/uL 4.8 6.2 6.4  Hemoglobin 13.0 - 17.0 g/dL 15.8 14.9 16.4  Hematocrit 39.0 - 52.0 % 47.2 43.0 46.5  Platelets 150 - 400 K/uL 207 201 198    CMP Latest Ref Rng & Units 06/22/2018 04/09/2018 02/03/2018  Glucose 65 - 99 mg/dL - - 90  BUN 6  - 24 mg/dL - - 12  Creatinine 0.76 - 1.27 mg/dL - - 1.00  Sodium 134 - 144 mmol/L - - 141  Potassium 3.5 - 5.2 mmol/L - - 3.9  Chloride 96 - 106 mmol/L - - 105  CO2 20 - 29 mmol/L - - 22  Calcium 8.7 - 10.2 mg/dL - - 8.4(L)  Total Protein 6.1 - 8.1 g/dL 6.4 6.5 6.5  Total Bilirubin 0.2 - 1.2 mg/dL 0.6 0.7 0.5  Alkaline Phos 39 - 117 IU/L - - 112  AST 10 - 35 U/L 157(H) 79(H) 81(H)  ALT 9 - 46 U/L 98(H) 49(H) 53(H)    Hepatic Function Latest Ref Rng & Units 06/22/2018 04/09/2018 02/03/2018  Total Protein 6.1 - 8.1 g/dL 6.4 6.5 6.5  Albumin 3.5 - 5.5 g/dL - - 4.0  AST 10 - 35 U/L 157(H) 79(H) 81(H)  ALT 9 - 46 U/L 98(H) 49(H) 53(H)  Alk Phosphatase 39 - 117 IU/L - - 112  Total Bilirubin 0.2 - 1.2 mg/dL 0.6 0.7 0.5  Bilirubin, Direct 0.0 - 0.2 mg/dL 0.1 0.1 0.21    Abdominal ultrasound from 07/15/2018 Increased hepatic echogenicity.  No focal lesions noted in the liver.  Portal vein is patent with normal directional blood flow.  Gallbladder without stones or wall thickening.  Spleen is within normal limits.  Both kidneys and abdominal aorta are normal.  Assessment:  #1.  Elevated transaminases trending upwards.  Etiology felt to be fatty liver.  His transaminases were normal last year and year before.  He has gained 45 pounds in 15 months.  Doubt other etiologies but will screen for hepatitis B as well as iron overload. It remains to be seen as to the cause of liver disease in his father.  Once etiology is known he may be subjected to further evaluation.  #2.  History of invasive carcinoma and a 10 mm polyp with clear margins diagnosed in March 2018.  He declined to undergo surgery.  CEA levels have remained normal.  CT 2 years ago was unremarkable.  Last colonoscopy was in April 2019 and next one would be in March 2022  #3.  Obesity.  He has gained 45 pounds in the last 15 months. TSH was normal in July 2019.  He must change his eating habits and do regular physical activity or walking as  tolerated.  [place]icum   Plan:  Patient will go to the lab for LFTs serum ferritin and hepatitis B surface antigen in 3 weeks. Patient counseled for the need to increase physical activity and decrease calorie intake. If he is not able to lose weight would consider dietary consultation. Office visit in 6 months.

## 2018-07-29 NOTE — Patient Instructions (Signed)
Next blood work would be in 3 weeks.  Office will remind you. You must walk for at least 30 minutes daily and watch calorie intake.

## 2018-07-30 ENCOUNTER — Encounter (INDEPENDENT_AMBULATORY_CARE_PROVIDER_SITE_OTHER): Payer: Self-pay | Admitting: Internal Medicine

## 2018-08-02 ENCOUNTER — Other Ambulatory Visit (INDEPENDENT_AMBULATORY_CARE_PROVIDER_SITE_OTHER): Payer: Self-pay | Admitting: *Deleted

## 2018-08-02 DIAGNOSIS — R7989 Other specified abnormal findings of blood chemistry: Secondary | ICD-10-CM

## 2018-08-04 ENCOUNTER — Ambulatory Visit: Payer: Medicaid Other | Admitting: Family Medicine

## 2018-08-16 ENCOUNTER — Other Ambulatory Visit: Payer: Self-pay

## 2018-08-17 ENCOUNTER — Ambulatory Visit: Payer: Medicaid Other | Admitting: Family Medicine

## 2018-08-17 DIAGNOSIS — E039 Hypothyroidism, unspecified: Secondary | ICD-10-CM | POA: Insufficient documentation

## 2018-08-17 DIAGNOSIS — R1110 Vomiting, unspecified: Secondary | ICD-10-CM | POA: Diagnosis not present

## 2018-08-17 DIAGNOSIS — E782 Mixed hyperlipidemia: Secondary | ICD-10-CM | POA: Insufficient documentation

## 2018-08-17 DIAGNOSIS — R0902 Hypoxemia: Secondary | ICD-10-CM | POA: Diagnosis not present

## 2018-08-17 DIAGNOSIS — R000 Tachycardia, unspecified: Secondary | ICD-10-CM | POA: Diagnosis not present

## 2018-08-17 DIAGNOSIS — E559 Vitamin D deficiency, unspecified: Secondary | ICD-10-CM | POA: Insufficient documentation

## 2018-08-17 DIAGNOSIS — E038 Other specified hypothyroidism: Secondary | ICD-10-CM | POA: Insufficient documentation

## 2018-08-18 DIAGNOSIS — R0902 Hypoxemia: Secondary | ICD-10-CM | POA: Diagnosis not present

## 2018-08-18 DIAGNOSIS — R000 Tachycardia, unspecified: Secondary | ICD-10-CM | POA: Diagnosis not present

## 2018-08-18 DIAGNOSIS — R112 Nausea with vomiting, unspecified: Secondary | ICD-10-CM | POA: Diagnosis not present

## 2018-08-18 DIAGNOSIS — Z20828 Contact with and (suspected) exposure to other viral communicable diseases: Secondary | ICD-10-CM | POA: Diagnosis not present

## 2018-08-27 ENCOUNTER — Ambulatory Visit: Payer: Medicaid Other | Admitting: Family Medicine

## 2018-09-07 DIAGNOSIS — R945 Abnormal results of liver function studies: Secondary | ICD-10-CM | POA: Diagnosis not present

## 2018-09-08 LAB — HEPATIC FUNCTION PANEL
AG Ratio: 1.5 (calc) (ref 1.0–2.5)
ALT: 79 U/L — ABNORMAL HIGH (ref 9–46)
AST: 101 U/L — ABNORMAL HIGH (ref 10–35)
Albumin: 3.6 g/dL (ref 3.6–5.1)
Alkaline phosphatase (APISO): 79 U/L (ref 35–144)
Bilirubin, Direct: 0.2 mg/dL (ref 0.0–0.2)
Globulin: 2.4 g/dL (calc) (ref 1.9–3.7)
Indirect Bilirubin: 0.8 mg/dL (calc) (ref 0.2–1.2)
Total Bilirubin: 1 mg/dL (ref 0.2–1.2)
Total Protein: 6 g/dL — ABNORMAL LOW (ref 6.1–8.1)

## 2018-09-08 LAB — FERRITIN: Ferritin: 801 ng/mL — ABNORMAL HIGH (ref 38–380)

## 2018-09-08 LAB — HEPATITIS B SURFACE ANTIGEN: Hepatitis B Surface Ag: NONREACTIVE

## 2018-09-15 ENCOUNTER — Other Ambulatory Visit (INDEPENDENT_AMBULATORY_CARE_PROVIDER_SITE_OTHER): Payer: Self-pay | Admitting: *Deleted

## 2018-09-15 DIAGNOSIS — Z85038 Personal history of other malignant neoplasm of large intestine: Secondary | ICD-10-CM

## 2018-09-15 DIAGNOSIS — R7989 Other specified abnormal findings of blood chemistry: Secondary | ICD-10-CM

## 2018-09-21 ENCOUNTER — Other Ambulatory Visit: Payer: Self-pay

## 2018-09-22 ENCOUNTER — Ambulatory Visit (INDEPENDENT_AMBULATORY_CARE_PROVIDER_SITE_OTHER): Payer: Medicaid Other | Admitting: *Deleted

## 2018-09-22 DIAGNOSIS — E538 Deficiency of other specified B group vitamins: Secondary | ICD-10-CM | POA: Diagnosis not present

## 2018-09-22 NOTE — Progress Notes (Signed)
Pt given Cyanocobalamin inj Tolerated well 

## 2018-09-26 DIAGNOSIS — R000 Tachycardia, unspecified: Secondary | ICD-10-CM | POA: Diagnosis not present

## 2018-09-26 DIAGNOSIS — R0689 Other abnormalities of breathing: Secondary | ICD-10-CM | POA: Diagnosis not present

## 2018-09-26 DIAGNOSIS — I4891 Unspecified atrial fibrillation: Secondary | ICD-10-CM | POA: Diagnosis not present

## 2018-10-10 DIAGNOSIS — R0902 Hypoxemia: Secondary | ICD-10-CM | POA: Diagnosis not present

## 2018-10-10 DIAGNOSIS — I4892 Unspecified atrial flutter: Secondary | ICD-10-CM | POA: Diagnosis not present

## 2018-10-10 DIAGNOSIS — R000 Tachycardia, unspecified: Secondary | ICD-10-CM | POA: Diagnosis not present

## 2018-10-10 DIAGNOSIS — I959 Hypotension, unspecified: Secondary | ICD-10-CM | POA: Diagnosis not present

## 2018-10-10 DIAGNOSIS — R0989 Other specified symptoms and signs involving the circulatory and respiratory systems: Secondary | ICD-10-CM | POA: Diagnosis not present

## 2018-10-10 DIAGNOSIS — E876 Hypokalemia: Secondary | ICD-10-CM | POA: Diagnosis not present

## 2018-10-12 ENCOUNTER — Other Ambulatory Visit: Payer: Self-pay

## 2018-10-13 ENCOUNTER — Ambulatory Visit: Payer: Medicaid Other | Admitting: Family Medicine

## 2018-10-13 ENCOUNTER — Encounter: Payer: Self-pay | Admitting: Family Medicine

## 2018-10-13 VITALS — BP 135/82 | HR 100 | Temp 97.1°F | Resp 22 | Ht 70.0 in | Wt 263.0 lb

## 2018-10-13 DIAGNOSIS — E559 Vitamin D deficiency, unspecified: Secondary | ICD-10-CM | POA: Diagnosis not present

## 2018-10-13 DIAGNOSIS — I4892 Unspecified atrial flutter: Secondary | ICD-10-CM

## 2018-10-13 DIAGNOSIS — Z7289 Other problems related to lifestyle: Secondary | ICD-10-CM | POA: Diagnosis not present

## 2018-10-13 DIAGNOSIS — Z789 Other specified health status: Secondary | ICD-10-CM

## 2018-10-13 DIAGNOSIS — E039 Hypothyroidism, unspecified: Secondary | ICD-10-CM

## 2018-10-13 DIAGNOSIS — E782 Mixed hyperlipidemia: Secondary | ICD-10-CM | POA: Diagnosis not present

## 2018-10-13 DIAGNOSIS — E663 Overweight: Secondary | ICD-10-CM | POA: Insufficient documentation

## 2018-10-13 DIAGNOSIS — E538 Deficiency of other specified B group vitamins: Secondary | ICD-10-CM | POA: Diagnosis not present

## 2018-10-13 DIAGNOSIS — I10 Essential (primary) hypertension: Secondary | ICD-10-CM | POA: Diagnosis not present

## 2018-10-13 DIAGNOSIS — F1090 Alcohol use, unspecified, uncomplicated: Secondary | ICD-10-CM

## 2018-10-13 MED ORDER — CHLORTHALIDONE 25 MG PO TABS: 25.0000 mg | ORAL_TABLET | Freq: Every day | ORAL | 3 refills | Status: DC

## 2018-10-13 NOTE — Patient Instructions (Signed)
DASH Eating Plan DASH stands for "Dietary Approaches to Stop Hypertension." The DASH eating plan is a healthy eating plan that has been shown to reduce high blood pressure (hypertension). Additional health benefits may include reducing the risk of type 2 diabetes mellitus, heart disease, and stroke. The DASH eating plan may also help with weight loss.  WHAT DO I NEED TO KNOW ABOUT THE DASH EATING PLAN? For the DASH eating plan, you will follow these general guidelines:  Choose foods with a percent daily value for sodium of less than 5% (as listed on the food label).  Use salt-free seasonings or herbs instead of table salt or sea salt.  Check with your health care provider or pharmacist before using salt substitutes.  Eat lower-sodium products, often labeled as "lower sodium" or "no salt added."  Eat fresh foods.  Eat more vegetables, fruits, and low-fat dairy products.  Choose whole grains. Look for the word "whole" as the first word in the ingredient list.  Choose fish and skinless chicken or turkey more often than red meat. Limit fish, poultry, and meat to 6 oz (170 g) each day.  Limit sweets, desserts, sugars, and sugary drinks.  Choose heart-healthy fats.  Limit cheese to 1 oz (28 g) per day.  Eat more home-cooked food and less restaurant, buffet, and fast food.  Limit fried foods.  Cook foods using methods other than frying.  Limit canned vegetables. If you do use them, rinse them well to decrease the sodium.  When eating at a restaurant, ask that your food be prepared with less salt, or no salt if possible.  WHAT FOODS CAN I EAT? Seek help from a dietitian for individual calorie needs.  Grains Whole grain or whole wheat bread. Brown rice. Whole grain or whole wheat pasta. Quinoa, bulgur, and whole grain cereals. Low-sodium cereals. Corn or whole wheat flour tortillas. Whole grain cornbread. Whole grain crackers. Low-sodium crackers.  Vegetables Fresh or frozen  vegetables (raw, steamed, roasted, or grilled). Low-sodium or reduced-sodium tomato and vegetable juices. Low-sodium or reduced-sodium tomato sauce and paste. Low-sodium or reduced-sodium canned vegetables.   Fruits All fresh, canned (in natural juice), or frozen fruits.  Meat and Other Protein Products Ground beef (85% or leaner), grass-fed beef, or beef trimmed of fat. Skinless chicken or turkey. Ground chicken or turkey. Pork trimmed of fat. All fish and seafood. Eggs. Dried beans, peas, or lentils. Unsalted nuts and seeds. Unsalted canned beans.  Dairy Low-fat dairy products, such as skim or 1% milk, 2% or reduced-fat cheeses, low-fat ricotta or cottage cheese, or plain low-fat yogurt. Low-sodium or reduced-sodium cheeses.  Fats and Oils Tub margarines without trans fats. Light or reduced-fat mayonnaise and salad dressings (reduced sodium). Avocado. Safflower, olive, or canola oils. Natural peanut or almond butter.  Other Unsalted popcorn and pretzels. The items listed above may not be a complete list of recommended foods or beverages. Contact your dietitian for more options.  WHAT FOODS ARE NOT RECOMMENDED?  Grains White bread. White pasta. White rice. Refined cornbread. Bagels and croissants. Crackers that contain trans fat.  Vegetables Creamed or fried vegetables. Vegetables in a cheese sauce. Regular canned vegetables. Regular canned tomato sauce and paste. Regular tomato and vegetable juices.  Fruits Dried fruits. Canned fruit in light or heavy syrup. Fruit juice.  Meat and Other Protein Products Fatty cuts of meat. Ribs, chicken wings, bacon, sausage, bologna, salami, chitterlings, fatback, hot dogs, bratwurst, and packaged luncheon meats. Salted nuts and seeds. Canned beans with salt.    Dairy Whole or 2% milk, cream, half-and-half, and cream cheese. Whole-fat or sweetened yogurt. Full-fat cheeses or blue cheese. Nondairy creamers and whipped toppings. Processed cheese,  cheese spreads, or cheese curds.  Condiments Onion and garlic salt, seasoned salt, table salt, and sea salt. Canned and packaged gravies. Worcestershire sauce. Tartar sauce. Barbecue sauce. Teriyaki sauce. Soy sauce, including reduced sodium. Steak sauce. Fish sauce. Oyster sauce. Cocktail sauce. Horseradish. Ketchup and mustard. Meat flavorings and tenderizers. Bouillon cubes. Hot sauce. Tabasco sauce. Marinades. Taco seasonings. Relishes.  Fats and Oils Butter, stick margarine, lard, shortening, ghee, and bacon fat. Coconut, palm kernel, or palm oils. Regular salad dressings.  Other Pickles and olives. Salted popcorn and pretzels.  The items listed above may not be a complete list of foods and beverages to avoid. Contact your dietitian for more information.  WHERE CAN I FIND MORE INFORMATION? [employer]: www.nhlbi.nih.gov/health/health-topics/topics/dash/ Document Released: 01/02/2011 Document Revised: 05/30/2013 Document Reviewed: 11/17/2012 [employer] Patient Information 2015 [employer]. This information is not intended to replace advice given to you by your health care provider. Make sure you discuss any questions you have with your health care provider.   I think that you would greatly benefit from seeing a nutritionist.  If you are interested, please call Dr Sykes at [phone] to schedule an appointment.   

## 2018-10-13 NOTE — Progress Notes (Signed)
Subjective:  Patient ID: Earl [name], male    DOB: 1960-03-05, 58 y.o.   MRN: [id number]  Patient Care Team: Baruch Gouty, FNP as PCP - General (Family Medicine)   Chief Complaint:  Medical Management of Chronic Issues (6 mo ), Hyperlipidemia, Hypertension, and Hypothyroidism   HPI: Earl [name] is a 58 y.o. male presenting on 10/13/2018 for Medical Management of Chronic Issues (6 mo ), Hyperlipidemia, Hypertension, and Hypothyroidism  Today was my first visit with the pt. He was formerly followed by Dr. Evette Doffing who has left the practice. Pt is vague with history and unsure of current medications. Pt did not bring medication bottles with him today.   1. Mixed hyperlipidemia  Not on statin therapy. Last lipid panel in 01/2017 revealing mixed hyperlipidemia. Pt does not watch diet or exercise on a regular basis. Feels statin will cause harm to his body and does not want to take them.    2. Essential hypertension  Complaint with meds - No. Only takes clonidine when he feels it is needed. Feels is is causing dry mouth and cough and wishes to change this medication today. She does take Lisinopril on a daily basis though listed as an allergy. He states this is not the cause of his cough. Pt does not have medication bottles with him today.  Current Medications - Lisinopril, Clonidine, Cardizem Checking BP at home ranging 125/75 Exercising Regularly - No Watching Salt intake - No Pertinent ROS:  Headache - No Fatigue - No Visual Disturbances - No Chest pain - No Dyspnea - No Palpitations - intermittent but has history of A Flutter, states controlled with Cardizem LE edema - No They report good compliance with medications and can restate their regimen by memory. No medication side effects.  Family, social, and smoking history reviewed.   BP Readings from Last 3 Encounters:  10/13/18 135/82  07/30/18 132/90  02/03/18 104/72   CMP Latest Ref Rng & Units 09/07/2018 06/22/2018  04/09/2018  Glucose 65 - 99 mg/dL - - -  BUN 6 - 24 mg/dL - - -  Creatinine 0.76 - 1.27 mg/dL - - -  Sodium 134 - 144 mmol/L - - -  Potassium 3.5 - 5.2 mmol/L - - -  Chloride 96 - 106 mmol/L - - -  CO2 20 - 29 mmol/L - - -  Calcium 8.7 - 10.2 mg/dL - - -  Total Protein 6.1 - 8.1 g/dL 6.0(L) 6.4 6.5  Total Bilirubin 0.2 - 1.2 mg/dL 1.0 0.6 0.7  Alkaline Phos 39 - 117 IU/L - - -  AST 10 - 35 U/L 101(H) 157(H) 79(H)  ALT 9 - 46 U/L 79(H) 98(H) 49(H)      3. Acquired hypothyroidism  Previous borderline TSH levels. Denies symptoms. Not on repletion therapy.   4. Vitamin B12 deficiency  On monthly repletion therapy. States lower extremity paresthesias are well controlled with repletion therapy.    5. Vitamin D deficiency  Pt is taking oral repletion therapy. Denies bone pain and tenderness, muscle weakness, fracture, and difficulty walking. Lab Results  Component Value Date   VD25OH 45.5 02/03/2018   VD25OH 56.2 10/02/2016   Lab Results  Component Value Date   CALCIUM 8.4 (L) 02/03/2018      6. Atrial flutter, unspecified type ([place]) Chronic On Cardizem for rate control. Last saw cardiology in 11/2017. Feels he no longer needs to see them as he feels his symptoms are well controlled. No chest pain,  shortness of breath, weakness, dizziness, or syncope. Does have intermittent palpitations that do not last long. State this may happen 1-2 times per week.    7. Morbid obesity ([place]) Chronic Does not watch diet or exercise on regular basis.    8. Alcohol use  Pt has a history of alcohol abuse and elevated liver enzymes. Pt states he continues to drink on a daily basis but feels this is controlled and that he can stop at any time. Pt states he drinks several beers per day but not as much as he used to.      Relevant past medical, surgical, family, and social history reviewed and updated as indicated.  Allergies and medications reviewed and updated. Date reviewed: Chart in Epic.    Past Medical History:  Diagnosis Date  . Alcohol abuse   . Colon cancer ([place])   . Depression   . Gall stones   . Hypertension   . Kidney stone   . Typical atrial flutter [facility])     Past Surgical History:  Procedure Laterality Date  . A-FLUTTER ABLATION N/A 07/31/2016   Procedure: A-Flutter Ablation;  Surgeon: [name], MD;  Location: [place] [facility];  Service: Cardiovascular;  Laterality: N/A;  . COLONOSCOPY N/A 04/17/2016   Procedure: COLONOSCOPY;  Surgeon: [name], MD;  [facility];  Service: Endoscopy;  Laterality: N/A;  1200  . COLONOSCOPY N/A 05/07/2017   Procedure: COLONOSCOPY;  Surgeon: [name], MD;  Location: [facility];  Service: Endoscopy;  Laterality: N/A;  200  . MINOR CARPAL TUNNEL     right  . POLYPECTOMY  04/17/2016   Procedure: POLYPECTOMY;  Surgeon: [name], MD;  Location: [facility];  Service: Endoscopy;;  sigmoid    Social History   Socioeconomic History  . Marital status: Single    Spouse name: Not on file  . Number of children: Not on file  . Years of education: Not on file  . Highest education level: Not on file  Occupational History  . Not on file  Social Needs  . Financial resource strain: Not on file  . Food insecurity    Worry: Not on file    Inability: Not on file  . Transportation needs    Medical: Not on file    Non-medical: Not on file  Tobacco Use  . Smoking status: Former Smoker    Quit date: 03/03/1979    Years since quitting: 39.6  . Smokeless tobacco: Never Used  Substance and Sexual Activity  . Alcohol use: No    Alcohol/week: 8.0 standard drinks    Types: 8 Cans of beer per week    Comment: drinks heavily at times  . Drug use: No  . Sexual activity: Not on file  Lifestyle  . Physical activity    Days per week: Not on file    Minutes per session: Not on file  . Stress: Not on file  Relationships  . Social Herbalist on phone: Not on file    Gets together: Not on  file    Attends religious service: Not on file    Active member of club or organization: Not on file    Attends meetings of clubs or organizations: Not on file    Relationship status: Not on file  . Intimate partner violence    Fear of current or ex partner: Not on file    Emotionally abused: Not on file  Physically abused: Not on file    Forced sexual activity: Not on file  Other Topics Concern  . Not on file  Social History Narrative   Disabled   Lives in [place]    Outpatient Encounter Medications as of 10/13/2018  Medication Sig  . ALPRAZolam (XANAX) 1 MG tablet Take 1 mg by mouth 2 (two) times daily as needed for anxiety.   Marland Kitchen diltiazem (CARDIZEM CD) 120 MG 24 hr capsule Take 120 mg by mouth daily.  Marland Kitchen ketoconazole (NIZORAL) 2 % shampoo Apply 1 application topically once a week.  Marland Kitchen lisinopril (ZESTRIL) 40 MG tablet Take 40 mg by mouth daily.  Marland Kitchen LYRICA 200 MG capsule Take 200 mg by mouth 2 (two) times daily.  . Vitamin D, Ergocalciferol, (DRISDOL) 50000 units CAPS capsule Take 50,000 Units by mouth every 30 (thirty) days. Fridays  . zinc gluconate 50 MG tablet Take 50 mg by mouth once a week. Take with vitamin d  . [DISCONTINUED] cloNIDine (CATAPRES) 0.1 MG tablet Take 0.1 mg by mouth daily.  . [DISCONTINUED] linaclotide (LINZESS) 72 MCG capsule Take 72 mcg by mouth daily as needed (constipation).  . [DISCONTINUED] losartan (COZAAR) 100 MG tablet Take 1 tablet (100 mg total) by mouth daily.  . chlorthalidone (HYGROTON) 25 MG tablet Take 1 tablet (25 mg total) by mouth daily.   Facility-Administered Encounter Medications as of 10/13/2018  Medication  . cyanocobalamin ((VITAMIN B-12)) injection 1,000 mcg    Allergies  Allergen Reactions  . Amitriptyline Anaphylaxis  . Amoxicillin Anaphylaxis and Rash    Has patient had a PCN reaction causing immediate rash, facial/tongue/throat swelling, SOB or lightheadedness with hypotension: Yes Has patient had a PCN reaction causing severe  rash involving mucus membranes or skin necrosis: No Has patient had a PCN reaction that required hospitalization: No Has patient had a PCN reaction occurring within the last 10 years: Yes If all of the above answers are "NO", then may proceed with Cephalosporin use.   Diona Fanti [Aspirin] Anaphylaxis  . Clonidine Derivatives Other (See Comments)    Severe Dry Mouth  . Hydrochlorothiazide     Caused rectal bleeding and hematuria  . Lisinopril     Adverse reaction--causes cough    Review of Systems  Constitutional: Negative for activity change, appetite change, chills, diaphoresis, fatigue, fever and unexpected weight change.  HENT:       Dry mouth  Eyes: Negative.  Negative for photophobia and visual disturbance.  Respiratory: Positive for cough. Negative for choking, chest tightness and shortness of breath.   Cardiovascular: Positive for palpitations. Negative for chest pain and leg swelling.  Gastrointestinal: Negative for abdominal distention, abdominal pain, anal bleeding, blood in stool, constipation, diarrhea, nausea, rectal pain and vomiting.  Endocrine: Negative.  Negative for cold intolerance, heat intolerance, polydipsia, polyphagia and polyuria.  Genitourinary: Negative for decreased urine volume, difficulty urinating, dysuria, frequency and urgency.  Musculoskeletal: Negative for arthralgias, back pain and myalgias.  Skin: Negative.  Negative for color change, pallor and rash.  Allergic/Immunologic: Negative.   Neurological: Negative for dizziness, tremors, seizures, syncope, facial asymmetry, speech difficulty, weakness, light-headedness, numbness and headaches.  Hematological: Negative.  Does not bruise/bleed easily.  Psychiatric/Behavioral: Positive for dysphoric mood and sleep disturbance. Negative for agitation, behavioral problems, confusion, decreased concentration, hallucinations, self-injury and suicidal ideas. The patient is not nervous/anxious and is not hyperactive.    All other systems reviewed and are negative.       Objective:  BP 135/82   Pulse 100  Temp (!) 97.1 F (36.2 C)   Resp (!) 22   Ht 5' 10"  (1.778 m)   Wt 263 lb (119.3 kg)   SpO2 97%   BMI 37.74 kg/m    Wt Readings from Last 3 Encounters:  10/13/18 263 lb (119.3 kg)  07/30/18 260 lb 9 oz (118.2 kg)  02/03/18 251 lb (113.9 kg)    Physical Exam Vitals signs and nursing note reviewed.  Constitutional:      General: He is not in acute distress.    Appearance: Normal appearance. He is well-developed and well-groomed. He is obese. He is not ill-appearing, toxic-appearing or diaphoretic.  HENT:     Head: Normocephalic and atraumatic.     Jaw: There is normal jaw occlusion.     Right Ear: Hearing normal.     Left Ear: Hearing normal.     Nose: Nose normal.     Mouth/Throat:     Lips: Pink.     Mouth: Mucous membranes are moist.     Pharynx: Oropharynx is clear. Uvula midline.  Eyes:     General: Lids are normal.     Extraocular Movements: Extraocular movements intact.     Conjunctiva/sclera: Conjunctivae normal.     Pupils: Pupils are equal, round, and reactive to light.  Neck:     Musculoskeletal: Normal range of motion and neck supple.     Thyroid: No thyroid mass, thyromegaly or thyroid tenderness.     Vascular: No carotid bruit or JVD.     Trachea: Trachea and phonation normal.  Cardiovascular:     Rate and Rhythm: Normal rate and regular rhythm.     Chest Wall: PMI is not displaced.     Pulses: Normal pulses.     Heart sounds: Normal heart sounds. No murmur. No friction rub. No gallop.   Pulmonary:     Effort: Pulmonary effort is normal. No respiratory distress.     Breath sounds: Normal breath sounds. No wheezing.  Abdominal:     General: Abdomen is protuberant. Bowel sounds are normal. There is no distension or abdominal bruit.     Palpations: Abdomen is soft. There is no hepatomegaly, splenomegaly or mass.     Tenderness: There is no abdominal tenderness.  There is no right CVA tenderness, left CVA tenderness, guarding or rebound.     Hernia: No hernia is present.  Musculoskeletal: Normal range of motion.     Right lower leg: No edema.     Left lower leg: No edema.  Lymphadenopathy:     Cervical: No cervical adenopathy.  Skin:    General: Skin is warm and dry.     Capillary Refill: Capillary refill takes less than 2 seconds.     Coloration: Skin is not cyanotic, jaundiced or pale.     Findings: No bruising or rash.  Neurological:     General: No focal deficit present.     Mental Status: He is alert and oriented to person, place, and time.     Cranial Nerves: Cranial nerves are intact.     Sensory: Sensation is intact.     Motor: Motor function is intact.     Coordination: Coordination is intact.     Gait: Gait is intact.     Deep Tendon Reflexes: Reflexes are normal and symmetric.  Psychiatric:        Attention and Perception: Attention and perception normal.        Mood and Affect: Affect normal. Mood is anxious.  Speech: Speech normal.        Behavior: Behavior normal. Behavior is cooperative.        Thought Content: Thought content normal.        Cognition and Memory: Cognition and memory normal.        Judgment: Judgment normal.     Results for orders placed or performed in visit on 08/02/18  Hepatitis B surface antigen  Result Value Ref Range   Hepatitis B Surface Ag NON-REACTIVE NON-REACTI  Ferritin  Result Value Ref Range   Ferritin 801 (H) 38 - 380 ng/mL  Hepatic function panel  Result Value Ref Range   Total Protein 6.0 (L) 6.1 - 8.1 g/dL   Albumin 3.6 3.6 - 5.1 g/dL   Globulin 2.4 1.9 - 3.7 g/dL (calc)   AG Ratio 1.5 1.0 - 2.5 (calc)   Total Bilirubin 1.0 0.2 - 1.2 mg/dL   Bilirubin, Direct 0.2 0.0 - 0.2 mg/dL   Indirect Bilirubin 0.8 0.2 - 1.2 mg/dL (calc)   Alkaline phosphatase (APISO) 79 35 - 144 U/L   AST 101 (H) 10 - 35 U/L   ALT 79 (H) 9 - 46 U/L       Pertinent labs & imaging results that  were available during my care of the patient were reviewed by me and considered in my medical decision making.  Assessment & Plan:  Jaquae was seen today for medical management of chronic issues, hyperlipidemia, hypertension and hypothyroidism.  Diagnoses and all orders for this visit:  Mixed hyperlipidemia Diet encouraged - increase intake of fresh fruits and vegetables, increase intake of lean proteins. Bake, broil, or grill foods. Avoid fried, greasy, and fatty foods. Avoid fast foods. Increase intake of fiber-rich whole grains. Exercise encouraged - at least 150 minutes per week and advance as tolerated.  Goal BMI < 25. Will initiate medications if warranted. Follow up in 3-6 months as discussed.  -     Lipid panel  Essential hypertension BP well controlled. Changes were made in regimen today due to pt preference. Pt wishes to stop clonidine due to dry mouth and cough. Pt states the lisinopril is not the cause and does not wish to change this today. Will stop clonidine and initiate chorthalidone. Daily blood pressure log given with instructions on how to fill out and told to bring to next visit. Goal BP 130/80. Pt aware to report any persistent high or low readings. DASH diet and exercise encouraged. Exercise at least 150 minutes per week and increase as tolerated. Goal BMI > 25. Stress management encouraged. Smoking cessation discussed. Avoid excessive alcohol. Avoid NSAID's. Avoid more than 2000 mg of sodium daily. Medications as prescribed. Follow up as scheduled.  -     CMP14+EGFR -     CBC with Differential/Platelet -     chlorthalidone (HYGROTON) 25 MG tablet; Take 1 tablet (25 mg total) by mouth daily.  Acquired hypothyroidism Pt has had several borderline TSH results. Will recheck today with T4 and T3 uptake. Will initiation repletion therapy if warranted.        -     Thyroid Panel With TSH   Vitamin B12 deficiency Currently on monthly repletion therapy and tolerating well. Will  recheck levels today. Will change repletion therapy if warranted.  -     Vitamin B12  Vitamin D deficiency Labs pending. Continue repletion therapy. If indicated, will change repletion dosage. Eat foods rich in Vit D including milk, orange juice, yogurt with vitamin D added, salmon  or mackerel, canned tuna fish, cereals with vitamin D added, and cod liver oil. Get out in the sun but make sure to wear at least SPF 30 sunscreen.  -     VITAMIN D 25 Hydroxy (Vit-D Deficiency, Fractures)  Atrial flutter, unspecified type ([place]) Has not followed up with cardiology since 11/2017. Pt states his symptoms are well controlled and he does not feel the need to see cardiology. Pt encouraged to see cardiology for reevaluation at least once yearly. Pt will make an appointment.   Morbid obesity ([place]) Diet and exercise encouraged. Pt aware of diet and exercise guidelines and try to adhere to these guidelines.    Alcohol use Cessation encouraged. Pt feels his use of alcohol is well controlled and that he does not have a problem. Has had abnormal LFTs in the past. Pt has followed up with GI pertaining to elevated LFTs. Pt feels this is unnecessary. Will recheck LFTs today. Pt declines referral to counselor, psychiatry, or AA information.   Continue all other maintenance medications.  Follow up plan: Return in about 2 weeks (around 10/27/2018), or if symptoms worsen or fail to improve, for CMP, HTN.  Continue healthy lifestyle choices, including diet (rich in fruits, vegetables, and lean proteins, and low in salt and simple carbohydrates) and exercise (at least 30 minutes of moderate physical activity daily).  Educational handout given for DASH diet   The above assessment and management plan was discussed with the patient. The patient verbalized understanding of and has agreed to the management plan. Patient is aware to call the clinic if they develop any new symptoms or if symptoms persist or worsen. Patient is  aware when to return to the clinic for a follow-up visit. Patient educated on when it is appropriate to go to the emergency department.   Monia Pouch, FNP-C [facility] [phone]

## 2018-10-14 LAB — CMP14+EGFR
ALT: 111 IU/L — ABNORMAL HIGH (ref 0–44)
AST: 140 IU/L — ABNORMAL HIGH (ref 0–40)
Albumin/Globulin Ratio: 1.4 (ref 1.2–2.2)
Albumin: 4.2 g/dL (ref 3.8–4.9)
Alkaline Phosphatase: 115 IU/L (ref 39–117)
BUN/Creatinine Ratio: 6 — ABNORMAL LOW (ref 9–20)
BUN: 6 mg/dL (ref 6–24)
Bilirubin Total: 0.5 mg/dL (ref 0.0–1.2)
CO2: 24 mmol/L (ref 20–29)
Calcium: 9.6 mg/dL (ref 8.7–10.2)
Chloride: 100 mmol/L (ref 96–106)
Creatinine, Ser: 1.05 mg/dL (ref 0.76–1.27)
GFR calc Af Amer: 89 mL/min/{1.73_m2} (ref >59)
GFR calc non Af Amer: 77 mL/min/{1.73_m2} (ref >59)
Globulin, Total: 3 g/dL (ref 1.5–4.5)
Glucose: 90 mg/dL (ref 65–99)
Potassium: 4.3 mmol/L (ref 3.5–5.2)
Sodium: 145 mmol/L — ABNORMAL HIGH (ref 134–144)
Total Protein: 7.2 g/dL (ref 6.0–8.5)

## 2018-10-14 LAB — CBC WITH DIFFERENTIAL/PLATELET
Basophils Absolute: 0 10*3/uL (ref 0.0–0.2)
Basos: 1 %
EOS (ABSOLUTE): 0.2 10*3/uL (ref 0.0–0.4)
Eos: 3 %
Hematocrit: 47.9 % (ref 37.5–51.0)
Hemoglobin: 16.2 g/dL (ref 13.0–17.7)
Immature Grans (Abs): 0 10*3/uL (ref 0.0–0.1)
Immature Granulocytes: 0 %
Lymphocytes Absolute: 2.5 10*3/uL (ref 0.7–3.1)
Lymphs: 31 %
MCH: 33.3 pg — ABNORMAL HIGH (ref 26.6–33.0)
MCHC: 33.8 g/dL (ref 31.5–35.7)
MCV: 98 fL — ABNORMAL HIGH (ref 79–97)
Monocytes Absolute: 0.8 10*3/uL (ref 0.1–0.9)
Monocytes: 9 %
Neutrophils Absolute: 4.5 10*3/uL (ref 1.4–7.0)
Neutrophils: 56 %
Platelets: 266 10*3/uL (ref 150–450)
RBC: 4.87 x10E6/uL (ref 4.14–5.80)
RDW: 12.3 % (ref 11.6–15.4)
WBC: 8.1 10*3/uL (ref 3.4–10.8)

## 2018-10-14 LAB — LIPID PANEL
Chol/HDL Ratio: 8.4 ratio — ABNORMAL HIGH (ref 0.0–5.0)
Cholesterol, Total: 234 mg/dL — ABNORMAL HIGH (ref 100–199)
HDL: 28 mg/dL — ABNORMAL LOW (ref >39)
LDL Chol Calc (NIH): 147 mg/dL — ABNORMAL HIGH (ref 0–99)
Triglycerides: 316 mg/dL — ABNORMAL HIGH (ref 0–149)
VLDL Cholesterol Cal: 59 mg/dL — ABNORMAL HIGH (ref 5–40)

## 2018-10-14 LAB — THYROID PANEL WITH TSH
Free Thyroxine Index: 2 (ref 1.2–4.9)
T3 Uptake Ratio: 21 % — ABNORMAL LOW (ref 24–39)
T4, Total: 9.6 ug/dL (ref 4.5–12.0)
TSH: 7.26 u[IU]/mL — ABNORMAL HIGH (ref 0.450–4.500)

## 2018-10-14 LAB — VITAMIN D 25 HYDROXY (VIT D DEFICIENCY, FRACTURES): Vit D, 25-Hydroxy: 41.9 ng/mL (ref 30.0–100.0)

## 2018-10-14 LAB — VITAMIN B12: Vitamin B-12: 1502 pg/mL — ABNORMAL HIGH (ref 232–1245)

## 2018-10-14 MED ORDER — ATORVASTATIN CALCIUM 40 MG PO TABS: 40.0000 mg | ORAL_TABLET | Freq: Every day | ORAL | 1 refills | Status: DC

## 2018-10-14 MED ORDER — LEVOTHYROXINE SODIUM 25 MCG PO TABS: 25.0000 ug | ORAL_TABLET | Freq: Every day | ORAL | 0 refills | Status: DC

## 2018-10-14 NOTE — Addendum Note (Signed)
Addended by: Baruch Gouty on: 10/14/2018 09:51 AM   Modules accepted: Orders

## 2018-10-26 ENCOUNTER — Ambulatory Visit: Payer: Medicaid Other | Admitting: Family Medicine

## 2018-11-02 ENCOUNTER — Other Ambulatory Visit: Payer: Self-pay

## 2018-11-03 ENCOUNTER — Ambulatory Visit: Payer: Medicaid Other | Admitting: Family Medicine

## 2018-11-03 ENCOUNTER — Encounter: Payer: Self-pay | Admitting: Family Medicine

## 2018-11-03 VITALS — BP 116/81 | HR 85 | Temp 98.4°F | Resp 20 | Ht 70.0 in | Wt 265.0 lb

## 2018-11-03 DIAGNOSIS — I10 Essential (primary) hypertension: Secondary | ICD-10-CM

## 2018-11-03 DIAGNOSIS — R945 Abnormal results of liver function studies: Secondary | ICD-10-CM

## 2018-11-03 DIAGNOSIS — R7989 Other specified abnormal findings of blood chemistry: Secondary | ICD-10-CM

## 2018-11-03 DIAGNOSIS — I4892 Unspecified atrial flutter: Secondary | ICD-10-CM | POA: Diagnosis not present

## 2018-11-03 DIAGNOSIS — E782 Mixed hyperlipidemia: Secondary | ICD-10-CM

## 2018-11-03 DIAGNOSIS — Z789 Other specified health status: Secondary | ICD-10-CM

## 2018-11-03 DIAGNOSIS — E039 Hypothyroidism, unspecified: Secondary | ICD-10-CM

## 2018-11-03 DIAGNOSIS — F1090 Alcohol use, unspecified, uncomplicated: Secondary | ICD-10-CM

## 2018-11-03 DIAGNOSIS — Z7289 Other problems related to lifestyle: Secondary | ICD-10-CM | POA: Diagnosis not present

## 2018-11-03 NOTE — Progress Notes (Signed)
Subjective:  Patient ID: Earl [name], male    DOB: [birth year], 59 y.o.   MRN: [id number]  Patient Care Team: Baruch Gouty, FNP as PCP - General (Family Medicine)   Chief Complaint:  Medical Management of Chronic Issues (2 week follow up ), Hypertension, and Hyperlipidemia   HPI: Earl [name] is a 58 y.o. male presenting on 11/03/2018 for Medical Management of Chronic Issues (2 week follow up ), Hypertension, and Hyperlipidemia   58 yo male presents for a 2 week follow up for HTN, HLD, and hypothroidism. He stopped taking clonidine and did start taking chlorthalidone. He reports he has not taken the Lipitor and does not plan too. He also has not started on red yeast rice but is open to trying the supplement. He also did not start Synthroid. He reports he has cut down on his alcohol intake. He is unable to quantify how much he drinks, but reports its about half of before. He reports "he doesn't eat much" and has trouble exercising now that he doesn't have a drive's license. He plans to start exercising by climbing his stairs at home.   He has been taking 2 tablets of Cardizem 120 mg at a time to control his symptoms. He would like a prescription for the double dosage he would take. He has not called for a follow up with his cardiologist because it is too far for him to get too. He would be willing see a local cardiologist.   Hypertension This is a chronic problem. The current episode started more than 1 year ago. The problem is controlled. Pertinent negatives include no blurred vision, chest pain, headaches, orthopnea, palpitations, peripheral edema or shortness of breath. Risk factors for coronary artery disease include dyslipidemia, male gender, obesity and sedentary lifestyle. Past treatments include ACE inhibitors, lifestyle changes, central alpha agonists and diuretics. Compliance problems include diet and exercise.  There is no history of angina, CAD/MI, CVA, heart failure or left  ventricular hypertrophy. There is no history of chronic renal disease.  Hyperlipidemia The problem is uncontrolled. Recent lipid tests were reviewed and are high. Exacerbating diseases include hypothyroidism and obesity. He has no history of chronic renal disease or diabetes. Pertinent negatives include no chest pain, myalgias or shortness of breath. He is currently on no antihyperlipidemic treatment (did not start statin or red yeast rice as prescribed). Compliance problems include adherence to diet and adherence to exercise.  Risk factors for coronary artery disease include hypertension, male sex, obesity, a sedentary lifestyle and dyslipidemia.    Relevant past medical, surgical, family, and social history reviewed and updated as indicated.  Allergies and medications reviewed and updated. Date reviewed: Chart in Epic.   Past Medical History:  Diagnosis Date   Alcohol abuse    Colon cancer ([place])    Depression    Gall stones    Hypertension    Kidney stone    Typical atrial flutter [facility])     Past Surgical History:  Procedure Laterality Date   A-FLUTTER ABLATION N/A 07/31/2016   Procedure: A-Flutter Ablation;  Surgeon: [name], MD;  Location: [facility];  Service: Cardiovascular;  Laterality: N/A;   COLONOSCOPY N/A 04/17/2016   Procedure: COLONOSCOPY;  Surgeon: [name], MD;  [facility];  Service: Endoscopy;  Laterality: N/A;  1200   COLONOSCOPY N/A 05/07/2017   Procedure: COLONOSCOPY;  Surgeon: [name], MD;  [facility];  Service: Endoscopy;  Laterality:  N/A;  200   MINOR CARPAL TUNNEL     right   POLYPECTOMY  04/17/2016   Procedure: POLYPECTOMY;  Surgeon: [name], MD;  Location: [facility];  Service: Endoscopy;;  sigmoid    Social History   Socioeconomic History   Marital status: Single    Spouse name: Not on file   Number of children: Not on file   Years of education: Not on file   Highest  education level: Not on file  Occupational History   Not on file  Social Needs   Financial resource strain: Not on file   Food insecurity    Worry: Not on file    Inability: Not on file   Transportation needs    Medical: Not on file    Non-medical: Not on file  Tobacco Use   Smoking status: Former Smoker    Quit date: 03/03/1979    Years since quitting: 39.6   Smokeless tobacco: Never Used  Substance and Sexual Activity   Alcohol use: Yes    Alcohol/week: 8.0 standard drinks    Types: 8 Cans of beer per week    Comment: drinks heavily at times   Drug use: No   Sexual activity: Not on file  Lifestyle   Physical activity    Days per week: Not on file    Minutes per session: Not on file   Stress: Not on file  Relationships   Social connections    Talks on phone: Not on file    Gets together: Not on file    Attends religious service: Not on file    Active member of club or organization: Not on file    Attends meetings of clubs or organizations: Not on file    Relationship status: Not on file   Intimate partner violence    Fear of current or ex partner: Not on file    Emotionally abused: Not on file    Physically abused: Not on file    Forced sexual activity: Not on file  Other Topics Concern   Not on file  Social History Narrative   Disabled   Lives in [place]    Outpatient Encounter Medications as of 11/03/2018  Medication Sig   ALPRAZolam (XANAX) 1 MG tablet Take 1 mg by mouth 2 (two) times daily as needed for anxiety.    chlorthalidone (HYGROTON) 25 MG tablet Take 1 tablet (25 mg total) by mouth daily.   diltiazem (CARDIZEM CD) 120 MG 24 hr capsule Take 120 mg by mouth daily.   lisinopril (ZESTRIL) 40 MG tablet Take 40 mg by mouth daily.   LYRICA 200 MG capsule Take 200 mg by mouth 2 (two) times daily.   zinc gluconate 50 MG tablet Take 50 mg by mouth once a week. Take with vitamin d   ketoconazole (NIZORAL) 2 % shampoo Apply 1 application  topically once a week.   [DISCONTINUED] atorvastatin (LIPITOR) 40 MG tablet Take 1 tablet (40 mg total) by mouth daily.   [DISCONTINUED] diltiazem (CARDIZEM CD) 120 MG 24 hr capsule Take 120 mg by mouth daily.   [DISCONTINUED] levothyroxine (SYNTHROID) 25 MCG tablet Take 1 tablet (25 mcg total) by mouth daily before breakfast.   [DISCONTINUED] Vitamin D, Ergocalciferol, (DRISDOL) 50000 units CAPS capsule Take 50,000 Units by mouth every 30 (thirty) days. Fridays   Facility-Administered Encounter Medications as of 11/03/2018  Medication   cyanocobalamin ((VITAMIN B-12)) injection 1,000 mcg    Allergies  Allergen Reactions  Amitriptyline Anaphylaxis   Amoxicillin Anaphylaxis and Rash    Has patient had a PCN reaction causing immediate rash, facial/tongue/throat swelling, SOB or lightheadedness with hypotension: Yes Has patient had a PCN reaction causing severe rash involving mucus membranes or skin necrosis: No Has patient had a PCN reaction that required hospitalization: No Has patient had a PCN reaction occurring within the last 10 years: Yes If all of the above answers are "NO", then may proceed with Cephalosporin use.    Asa [Aspirin] Anaphylaxis   Clonidine Derivatives Other (See Comments)    Severe Dry Mouth   Hydrochlorothiazide     Caused rectal bleeding and hematuria   Lisinopril     Adverse reaction--causes cough    Review of Systems  Constitutional: Positive for fatigue. Negative for activity change, chills, diaphoresis, fever and unexpected weight change.  HENT: Negative for congestion, sore throat and trouble swallowing.   Eyes: Negative for blurred vision, photophobia and visual disturbance.  Respiratory: Negative for shortness of breath.   Cardiovascular: Negative for chest pain, palpitations, orthopnea and leg swelling.  Gastrointestinal: Negative for abdominal pain, blood in stool, constipation, diarrhea, nausea and vomiting.  Endocrine: Negative for  polydipsia, polyphagia and polyuria.  Genitourinary: Negative for decreased urine volume, difficulty urinating and dysuria.  Musculoskeletal: Negative for myalgias.  Skin: Negative for rash and wound.  Neurological: Positive for numbness (reports no change). Negative for dizziness, syncope, weakness, light-headedness and headaches.  Psychiatric/Behavioral: Negative for confusion.        Objective:  BP 116/81    Pulse 85    Temp 98.4 F (36.9 C)    Resp 20    Ht 5' 10"  (1.778 m)    Wt 265 lb (120.2 kg)    SpO2 96%    BMI 38.02 kg/m    Wt Readings from Last 3 Encounters:  11/03/18 265 lb (120.2 kg)  10/13/18 263 lb (119.3 kg)  07/30/18 260 lb 9 oz (118.2 kg)    Physical Exam Vitals signs and nursing note reviewed.  Constitutional:      General: He is not in acute distress.    Appearance: He is obese. He is not ill-appearing, toxic-appearing or diaphoretic.  HENT:     Head: Normocephalic and atraumatic.     Right Ear: Tympanic membrane normal.     Left Ear: Tympanic membrane normal.     Nose: Nose normal.     Mouth/Throat:     Mouth: Mucous membranes are moist.     Pharynx: Oropharynx is clear.  Cardiovascular:     Rate and Rhythm: Normal rate and regular rhythm.     Pulses: Normal pulses.     Heart sounds: Normal heart sounds. No murmur.  Pulmonary:     Effort: Pulmonary effort is normal. No respiratory distress.     Breath sounds: Normal breath sounds.  Abdominal:     General: Bowel sounds are normal.     Palpations: Abdomen is soft.  Musculoskeletal: Normal range of motion.     Right lower leg: No edema.     Left lower leg: No edema.  Skin:    General: Skin is warm and dry.     Capillary Refill: Capillary refill takes less than 2 seconds.     Coloration: Skin is not jaundiced.  Neurological:     Mental Status: He is alert and oriented to person, place, and time. Mental status is at baseline.  Psychiatric:        Mood and Affect: Mood normal.  Behavior:  Behavior normal.     Results for orders placed or performed in visit on 10/13/18  CMP14+EGFR  Result Value Ref Range   Glucose 90 65 - 99 mg/dL   BUN 6 6 - 24 mg/dL   Creatinine, Ser 1.05 0.76 - 1.27 mg/dL   GFR calc non Af Amer 77 >59 mL/min/1.73   GFR calc Af Amer 89 >59 mL/min/1.73   BUN/Creatinine Ratio 6 (L) 9 - 20   Sodium 145 (H) 134 - 144 mmol/L   Potassium 4.3 3.5 - 5.2 mmol/L   Chloride 100 96 - 106 mmol/L   CO2 24 20 - 29 mmol/L   Calcium 9.6 8.7 - 10.2 mg/dL   Total Protein 7.2 6.0 - 8.5 g/dL   Albumin 4.2 3.8 - 4.9 g/dL   Globulin, Total 3.0 1.5 - 4.5 g/dL   Albumin/Globulin Ratio 1.4 1.2 - 2.2   Bilirubin Total 0.5 0.0 - 1.2 mg/dL   Alkaline Phosphatase 115 39 - 117 IU/L   AST 140 (H) 0 - 40 IU/L   ALT 111 (H) 0 - 44 IU/L  CBC with Differential/Platelet  Result Value Ref Range   WBC 8.1 3.4 - 10.8 x10E3/uL   RBC 4.87 4.14 - 5.80 x10E6/uL   Hemoglobin 16.2 13.0 - 17.7 g/dL   Hematocrit 47.9 37.5 - 51.0 %   MCV 98 (H) 79 - 97 fL   MCH 33.3 (H) 26.6 - 33.0 pg   MCHC 33.8 31.5 - 35.7 g/dL   RDW 12.3 11.6 - 15.4 %   Platelets 266 150 - 450 x10E3/uL   Neutrophils 56 Not Estab. %   Lymphs 31 Not Estab. %   Monocytes 9 Not Estab. %   Eos 3 Not Estab. %   Basos 1 Not Estab. %   Neutrophils Absolute 4.5 1.4 - 7.0 x10E3/uL   Lymphocytes Absolute 2.5 0.7 - 3.1 x10E3/uL   Monocytes Absolute 0.8 0.1 - 0.9 x10E3/uL   EOS (ABSOLUTE) 0.2 0.0 - 0.4 x10E3/uL   Basophils Absolute 0.0 0.0 - 0.2 x10E3/uL   Immature Granulocytes 0 Not Estab. %   Immature Grans (Abs) 0.0 0.0 - 0.1 x10E3/uL  Lipid panel  Result Value Ref Range   Cholesterol, Total 234 (H) 100 - 199 mg/dL   Triglycerides 316 (H) 0 - 149 mg/dL   HDL 28 (L) >39 mg/dL   VLDL Cholesterol Cal 59 (H) 5 - 40 mg/dL   LDL Chol Calc (NIH) 147 (H) 0 - 99 mg/dL   Chol/HDL Ratio 8.4 (H) 0.0 - 5.0 ratio  Thyroid Panel With TSH  Result Value Ref Range   TSH 7.260 (H) 0.450 - 4.500 uIU/mL   T4, Total 9.6 4.5 - 12.0  ug/dL   T3 Uptake Ratio 21 (L) 24 - 39 %   Free Thyroxine Index 2.0 1.2 - 4.9  Vitamin B12  Result Value Ref Range   Vitamin B-12 1,502 (H) 232 - 1,245 pg/mL  VITAMIN D 25 Hydroxy (Vit-D Deficiency, Fractures)  Result Value Ref Range   Vit D, 25-Hydroxy 41.9 30.0 - 100.0 ng/mL       Pertinent labs & imaging results that were available during my care of the patient were reviewed by me and considered in my medical decision making.  Assessment & Plan:  Earl [name] was seen today for medical management of chronic issues, hypertension and hyperlipidemia.  Diagnoses and all orders for this visit:  Essential hypertension BP well controlled. Changes were not made in regimen. Goal BP 130/80. Pt  aware to report any persistent high or low readings. DASH diet and exercise encouraged. Exercise at least 150 minutes per week and increase as tolerated. Goal BMI > 25. Stress management encouraged. Smoking cessation discussed. Avoid excessive alcohol. Avoid NSAID's. Avoid more than 2000 mg of sodium daily. Medications as prescribed. Follow up as scheduled.  -     CMP14+EGFR  Mixed hyperlipidemia Diet encouraged - increase intake of fresh fruits and vegetables, increase intake of lean proteins. Bake, broil, or grill foods. Avoid fried, greasy, and fatty foods. Avoid fast foods. Increase intake of fiber-rich whole grains. Exercise encouraged - at least 150 minutes per week and advance as tolerated.  Goal BMI < 25. Discussed red yeast rice supplementation and fish oil. Patient agrees to try this. Discussed preference for statin therapy given lipid levels and risk for CV disease. Follow up in 3 months as discussed.  -     CMP14+EGFR  Atrial flutter, unspecified type [facility]) Discussed the need for follow up with cardiology and the importance of taking medications as prescribed and not as he feels is appropriate. Pt aware of the adverse reactions that can occur if he takes additional doses of medications or omits  doses of medications. Pt verbalizes understanding and agrees to follow up with cardiology.  -     Ambulatory referral to Cardiology  Acquired hypothyroidism Thyroid disease has been poorly controlled. Discussed symptoms and importance of replacement therapy for hypothyroidism. After providing further education, patient agrees to start taking Synthroid as prescribed. Make sure to take medications on an empty stomach with a full [place] of water. Make sure to avoid vitamins or supplements for at least 4 hours before and 4 hours after taking medications. Repeat labs in 3 months if adjustments are made and in 6 months if stable.     Continue all other maintenance medications.  Follow up plan: Return in about 3 months (around 02/03/2019), or if symptoms worsen or fail to improve, for TSH, Lipids.  Continue healthy lifestyle choices, including diet (rich in fruits, vegetables, and lean proteins, and low in salt and simple carbohydrates) and exercise (at least 30 minutes of moderate physical activity daily).   The above assessment and management plan was discussed with the patient. The patient verbalized understanding of and has agreed to the management plan. Patient is aware to call the clinic if they develop any new symptoms or if symptoms persist or worsen. Patient is aware when to return to the clinic for a follow-up visit. Patient educated on when it is appropriate to go to the emergency department.   Marjorie Smolder, RN, FNP student [facility] [phone]   I personally was present during the history, physical exam, and medical decision-making activities of this service and have verified that the service and findings are accurately documented in the nurse practitioner student's note.  Monia Pouch, FNP-C [facility] [address] [place], [place] [address] [phone]

## 2018-11-03 NOTE — Patient Instructions (Addendum)
Start Synthroid! This is very important. Avoid all Alcohol use, your liver enzymes are elevated. Only use tylenol as needed.  Red Yeast Rice 2400 mg daily Omega 3 Fatty Acids twice daily with meals

## 2018-11-04 ENCOUNTER — Telehealth: Payer: Self-pay | Admitting: Cardiovascular Disease

## 2018-11-04 LAB — CMP14+EGFR
ALT: 127 IU/L — ABNORMAL HIGH (ref 0–44)
AST: 140 IU/L — ABNORMAL HIGH (ref 0–40)
Albumin/Globulin Ratio: 1.4 (ref 1.2–2.2)
Albumin: 3.9 g/dL (ref 3.8–4.9)
Alkaline Phosphatase: 98 IU/L (ref 39–117)
BUN/Creatinine Ratio: 13 (ref 9–20)
BUN: 14 mg/dL (ref 6–24)
Bilirubin Total: 0.4 mg/dL (ref 0.0–1.2)
CO2: 23 mmol/L (ref 20–29)
Calcium: 10.1 mg/dL (ref 8.7–10.2)
Chloride: 99 mmol/L (ref 96–106)
Creatinine, Ser: 1.04 mg/dL (ref 0.76–1.27)
GFR calc Af Amer: 90 mL/min/{1.73_m2} (ref >59)
GFR calc non Af Amer: 78 mL/min/{1.73_m2} (ref >59)
Globulin, Total: 2.7 g/dL (ref 1.5–4.5)
Glucose: 101 mg/dL — ABNORMAL HIGH (ref 65–99)
Potassium: 3.5 mmol/L (ref 3.5–5.2)
Sodium: 138 mmol/L (ref 134–144)
Total Protein: 6.6 g/dL (ref 6.0–8.5)

## 2018-11-04 NOTE — Telephone Encounter (Signed)
Virtual Visit Pre-Appointment Phone Call  "(Name), I am calling you today to discuss your upcoming appointment. We are currently trying to limit exposure to the virus that causes COVID-19 by seeing patients at home rather than in the office."  1. "What is the BEST phone number to call the day of the visit?" - include this in appointment notes  2. Do you have or have access to (through a family member/friend) a smartphone with video capability that we can use for your visit?" a. If yes - list this number in appt notes as cell (if different from BEST phone #) and list the appointment type as a VIDEO visit in appointment notes b. If no - list the appointment type as a PHONE visit in appointment notes  3. Confirm consent - "In the setting of the current Covid19 crisis, you are scheduled for a (phone or video) visit with your provider on (date) at (time).  Just as we do with many in-office visits, in order for you to participate in this visit, we must obtain consent.  If you'd like, I can send this to your mychart (if signed up) or email for you to review.  Otherwise, I can obtain your verbal consent now.  All virtual visits are billed to your insurance company just like a normal visit would be.  By agreeing to a virtual visit, we'd like you to understand that the technology does not allow for your provider to perform an examination, and thus may limit your provider's ability to fully assess your condition. If your provider identifies any concerns that need to be evaluated in person, we will make arrangements to do so.  Finally, though the technology is pretty good, we cannot assure that it will always work on either your or our end, and in the setting of a video visit, we may have to convert it to a phone-only visit.  In either situation, we cannot ensure that we have a secure connection.  Are you willing to proceed?" STAFF: Did the patient verbally acknowledge consent to telehealth visit? Document  YES/NO here: yes  4. Advise patient to be prepared - "Two hours prior to your appointment, go ahead and check your blood pressure, pulse, oxygen saturation, and your weight (if you have the equipment to check those) and write them all down. When your visit starts, your provider will ask you for this information. If you have an Apple Watch or Kardia device, please plan to have heart rate information ready on the day of your appointment. Please have a pen and paper handy nearby the day of the visit as well."  5. Give patient instructions for MyChart download to smartphone OR [email or web address] as below if video visit (depending on what platform provider is using)  6. Inform patient they will receive a phone call 15 minutes prior to their appointment time (may be from unknown caller ID) so they should be prepared to answer    TELEPHONE CALL NOTE  Earl [name] has been deemed a candidate for a follow-up tele-health visit to limit community exposure during the Covid-19 pandemic. I spoke with the patient via phone to ensure availability of phone/video source, confirm preferred email & phone number, and discuss instructions and expectations.  I reminded Earl [name] to be prepared with any vital sign and/or heart rhythm information that could potentially be obtained via home monitoring, at the time of his visit. I reminded Earl [name] to expect a phone call prior to  his visit.  Earl [name] 11/04/2018 9:34 AM   INSTRUCTIONS FOR DOWNLOADING THE MYCHART APP TO SMARTPHONE  - The patient must first make sure to have activated MyChart and know their login information - If Apple, go to [employer] and type in MyChart in the search bar and download the app. If Android, ask patient to go to [employer] and type in [place] in the search bar and download the app. The app is free but as with any other app downloads, their phone may require them to verify saved payment information or Apple/Android  password.  - The patient will need to then log into the app with their MyChart username and password, and select  as their healthcare provider to link the account. When it is time for your visit, go to the MyChart app, find appointments, and click Begin Video Visit. Be sure to Select Allow for your device to access the Microphone and Camera for your visit. You will then be connected, and your provider will be with you shortly.  **If they have any issues connecting, or need assistance please contact MyChart service desk [phone])**  **If using a computer, in order to ensure the best quality for their visit they will need to use either of the following Internet Browsers: [employer], or Google Chrome**  IF USING DOXIMITY or DOXY.ME - The patient will receive a link just prior to their visit by text.     FULL LENGTH CONSENT FOR TELE-HEALTH VISIT   I hereby voluntarily request, consent and authorize [facility] and its employed or contracted physicians, physician assistants, nurse practitioners or other licensed health care professionals (the Practitioner), to provide me with telemedicine health care services (the Services") as deemed necessary by the treating Practitioner. I acknowledge and consent to receive the Services by the Practitioner via telemedicine. I understand that the telemedicine visit will involve communicating with the Practitioner through live audiovisual communication technology and the disclosure of certain medical information by electronic transmission. I acknowledge that I have been given the opportunity to request an in-person assessment or other available alternative prior to the telemedicine visit and am voluntarily participating in the telemedicine visit.  I understand that I have the right to withhold or withdraw my consent to the use of telemedicine in the course of my care at any time, without affecting my right to future care or treatment,  and that the Practitioner or I may terminate the telemedicine visit at any time. I understand that I have the right to inspect all information obtained and/or recorded in the course of the telemedicine visit and may receive copies of available information for a reasonable fee.  I understand that some of the potential risks of receiving the Services via telemedicine include:   Delay or interruption in medical evaluation due to technological equipment failure or disruption;  Information transmitted may not be sufficient (e.g. poor resolution of images) to allow for appropriate medical decision making by the Practitioner; and/or   In rare instances, security protocols could fail, causing a breach of personal health information.  Furthermore, I acknowledge that it is my responsibility to provide information about my medical history, conditions and care that is complete and accurate to the best of my ability. I acknowledge that Practitioner's advice, recommendations, and/or decision may be based on factors not within their control, such as incomplete or inaccurate data provided by me or distortions of diagnostic images or specimens that may result from electronic transmissions. I  understand that the practice of medicine is not an exact science and that Practitioner makes no warranties or guarantees regarding treatment outcomes. I acknowledge that I will receive a copy of this consent concurrently upon execution via email to the email address I last provided but may also request a printed copy by calling the office of [facility].    I understand that my insurance will be billed for this visit.   I have read or had this consent read to me.  I understand the contents of this consent, which adequately explains the benefits and risks of the Services being provided via telemedicine.   I have been provided ample opportunity to ask questions regarding this consent and the Services and have had my questions  answered to my satisfaction.  I give my informed consent for the services to be provided through the use of telemedicine in my medical care  By participating in this telemedicine visit I agree to the above.

## 2018-11-04 NOTE — Addendum Note (Signed)
Addended by: Baruch Gouty on: 11/04/2018 10:03 AM   Modules accepted: Orders

## 2018-11-05 ENCOUNTER — Telehealth: Payer: Self-pay | Admitting: Family Medicine

## 2018-11-05 DIAGNOSIS — R7989 Other specified abnormal findings of blood chemistry: Secondary | ICD-10-CM

## 2018-11-05 DIAGNOSIS — R945 Abnormal results of liver function studies: Secondary | ICD-10-CM

## 2018-11-05 NOTE — Telephone Encounter (Signed)
Patient called to ask if he could stop drinking alcohol for 1 month and come back in to have liver functions rechecked. Ok'd by Monia Pouch. Patient notified and future orders placed.

## 2018-11-06 LAB — HEPATITIS PANEL, ACUTE
Hep A IgM: NEGATIVE
Hep B C IgM: NEGATIVE
Hep C Virus Ab: <0.1 s/co ratio (ref 0.0–0.9)
Hepatitis B Surface Ag: NEGATIVE

## 2018-11-06 LAB — SPECIMEN STATUS REPORT

## 2018-11-06 LAB — HEPATITIS B E ANTIGEN: Hep B E Ag: NEGATIVE

## 2018-11-08 ENCOUNTER — Encounter (INDEPENDENT_AMBULATORY_CARE_PROVIDER_SITE_OTHER): Payer: Self-pay | Admitting: Nurse Practitioner

## 2018-11-09 ENCOUNTER — Telehealth (INDEPENDENT_AMBULATORY_CARE_PROVIDER_SITE_OTHER): Payer: Medicaid Other | Admitting: Cardiovascular Disease

## 2018-11-09 ENCOUNTER — Encounter: Payer: Self-pay | Admitting: Cardiovascular Disease

## 2018-11-09 VITALS — Ht 70.0 in | Wt 265.0 lb

## 2018-11-09 DIAGNOSIS — F1010 Alcohol abuse, uncomplicated: Secondary | ICD-10-CM

## 2018-11-09 DIAGNOSIS — R002 Palpitations: Secondary | ICD-10-CM | POA: Diagnosis not present

## 2018-11-09 DIAGNOSIS — I10 Essential (primary) hypertension: Secondary | ICD-10-CM

## 2018-11-09 DIAGNOSIS — E039 Hypothyroidism, unspecified: Secondary | ICD-10-CM

## 2018-11-09 DIAGNOSIS — I471 Supraventricular tachycardia: Secondary | ICD-10-CM

## 2018-11-09 MED ORDER — DILTIAZEM HCL ER COATED BEADS 120 MG PO CP24: 120.0000 mg | ORAL_CAPSULE | Freq: Every day | ORAL | 3 refills | Status: DC

## 2018-11-09 NOTE — Progress Notes (Signed)
Virtual Visit via Telephone Note   This visit type was conducted due to national recommendations for restrictions regarding the COVID-19 Pandemic (e.g. social distancing) in an effort to limit this patient's exposure and mitigate transmission in our community.  Due to his co-morbid illnesses, this patient is at least at moderate risk for complications without adequate follow up.  This format is felt to be most appropriate for this patient at this time.  The patient did not have access to video technology/had technical difficulties with video requiring transitioning to audio format only (telephone).  All issues noted in this document were discussed and addressed.  No physical exam could be performed with this format.  Please refer to the patient's chart for his  consent to telehealth for [facility].   Date:  11/09/2018   ID:  Earl [name], DOB [birth year], MRN [id number]  Patient Location: Home Provider Location: Office  PCP:  Baruch Gouty, FNP  Cardiologist:  Kate Sable, MD  Electrophysiologist:  None   Evaluation Performed:  Follow-Up Visit  Chief Complaint: Palpitations  History of Present Illness:    Earl [name] is a 58 y.o. male with a history of hypertension, ectopic atrial tachycardia status post ablation 2018, and alcohol abuse.  He had a few palpitations after drinking excess alcohol.  Both his parents passed away this year. He said "I've had a really rough year".  He denies chest pain and shortness of breath.  He stopped alcohol and cut back to diltiazem to once daily.   Past Medical History:  Diagnosis Date  . Alcohol abuse   . Colon cancer ([place])   . Depression   . Gall stones   . Hypertension   . Kidney stone   . Typical atrial flutter [facility])    Past Surgical History:  Procedure Laterality Date  . A-FLUTTER ABLATION N/A 07/31/2016   Procedure: A-Flutter Ablation;  Surgeon: [name], MD;  Location: [place] [facility];  Service:  Cardiovascular;  Laterality: N/A;  . COLONOSCOPY N/A 04/17/2016   Procedure: COLONOSCOPY;  Surgeon: [name], MD;  [facility];  Service: Endoscopy;  Laterality: N/A;  1200  . COLONOSCOPY N/A 05/07/2017   Procedure: COLONOSCOPY;  Surgeon: [name], MD;  [facility];  Service: Endoscopy;  Laterality: N/A;  200  . MINOR CARPAL TUNNEL     right  . POLYPECTOMY  04/17/2016   Procedure: POLYPECTOMY;  Surgeon: [name], MD;  [facility];  Service: Endoscopy;;  sigmoid     Current Meds  Medication Sig  . ALPRAZolam (XANAX) 1 MG tablet Take 1 mg by mouth 2 (two) times daily as needed for anxiety.   . chlorthalidone (HYGROTON) 25 MG tablet Take 1 tablet (25 mg total) by mouth daily.  Marland Kitchen diltiazem (CARDIZEM CD) 120 MG 24 hr capsule Take 120 mg by mouth daily.  Marland Kitchen ketoconazole (NIZORAL) 2 % shampoo Apply 1 application topically as needed.   Marland Kitchen lisinopril (ZESTRIL) 40 MG tablet Take 40 mg by mouth daily.  Marland Kitchen LYRICA 200 MG capsule Take 200 mg by mouth 2 (two) times daily.  Marland Kitchen zinc gluconate 50 MG tablet Take 50 mg by mouth once a week. Take with vitamin d   Current Facility-Administered Medications for the 11/09/18 encounter (Telemedicine) with Herminio Commons, MD  Medication  . cyanocobalamin ((VITAMIN B-12)) injection 1,000 mcg     Allergies:   Amitriptyline, Amoxicillin, Asa [aspirin], Clonidine derivatives, Hydrochlorothiazide, and Lisinopril  Social History   Tobacco Use  . Smoking status: Former Smoker    Quit date: 03/03/1979    Years since quitting: 39.7  . Smokeless tobacco: Never Used  Substance Use Topics  . Alcohol use: Yes    Alcohol/week: 8.0 standard drinks    Types: 8 Cans of beer per week    Comment: drinks heavily at times  . Drug use: No     Family Hx: [name] family history includes Alzheimer's disease in his mother; Coronary artery disease in his father and paternal grandfather; Heart disease in his father.   ROS:   Please see the history of present illness.     All other systems reviewed and are negative.   Prior CV studies:   The following studies were reviewed today:  07/31/16: EPS/ablation CONCLUSIONS:  1. Sinus rhythm upon presentation.  2. The clinical tachycardia was ectopic atrial tachycardia successfully ablated at the 8 oclock position along the tricuspid valve annulus.  3. Successful CTI ablation 4. No inducible arrhythmias following ablation.  5. No early apparent complications.   06/17/16: TTE Study Conclusions - Left ventricle: The cavity size was normal. Wall thickness was normal. Systolic function was normal. The estimated ejection fraction was in the range of 55% to 60%. Wall motion was normal; there were no regional wall motion abnormalities. Indeterminate diastolic function.  Labs/Other Tests and Data Reviewed:    EKG:  No ECG reviewed.  Recent Labs: 10/13/2018: Hemoglobin 16.2; Platelets 266; TSH 7.260 11/03/2018: ALT 127; BUN 14; Creatinine, Ser 1.04; Potassium 3.5; Sodium 138   Recent Lipid Panel Lab Results  Component Value Date/Time   CHOL 234 (H) 10/13/2018 03:23 PM   TRIG 316 (H) 10/13/2018 03:23 PM   HDL 28 (L) 10/13/2018 03:23 PM   CHOLHDL 8.4 (H) 10/13/2018 03:23 PM   LDLCALC 147 (H) 10/13/2018 03:23 PM    Wt Readings from Last 3 Encounters:  11/09/18 265 lb (120.2 kg)  11/03/18 265 lb (120.2 kg)  10/13/18 263 lb (119.3 kg)     Objective:    Vital Signs:  Ht 5\' 10"  (1.778 m)   Wt 265 lb (120.2 kg)   BMI 38.02 kg/m    VITAL SIGNS:  reviewed  ASSESSMENT & PLAN:    1.  Palpitations/ectopic atrial Tachycardia: Symptomatically stable after undergoing radiofrequency ablation in July 2018. Currently taking Cardizem 120 mg daily. He only appears to have palpitations only when he drinks alcohol.  2.  Hypertension: Continue present therapy.  3. History of ETOH abuse: He only appears to have palpitations only when he drinks alcohol.  He needs complete cessation. He has some hepatic steatosis by [place] in June 2018. AST/ALT 140/127 on 11/03/18.  4. Hypothyroidism: TSH 7.26 on 10/13/18. On replacement therapy.    COVID-19 Education: The signs and symptoms of COVID-19 were discussed with the patient and how to seek care for testing (follow up with PCP or arrange E-visit).  The importance of social distancing was discussed today.  Time:   Today, I have spent 10 minutes with the patient with telehealth technology discussing the above problems.     Medication Adjustments/Labs and Tests Ordered: Current medicines are reviewed at length with the patient today.  Concerns regarding medicines are outlined above.   Tests Ordered: No orders of the defined types were placed in this encounter.   Medication Changes: No orders of the defined types were placed in this encounter.   Follow Up: prn  Signed, Kate Sable, MD  11/09/2018 10:59  AM    [place]

## 2018-11-09 NOTE — Addendum Note (Signed)
Addended by: Laurine Blazer on: 11/09/2018 11:16 AM   Modules accepted: Orders

## 2018-11-09 NOTE — Patient Instructions (Signed)
Medication Instructions:   Continue the Cardizem CD 120mg  DAILY.  Refill sent to pharmacy today.   Continue all other medications.    Labwork: none  Testing/Procedures: none  Follow-Up: As needed   Any Other Special Instructions Will Be Listed Below (If Applicable).  If you need a refill on your cardiac medications before your next appointment, please call your pharmacy.

## 2018-11-16 ENCOUNTER — Ambulatory Visit (HOSPITAL_COMMUNITY): Payer: Medicaid Other

## 2018-11-24 ENCOUNTER — Other Ambulatory Visit (INDEPENDENT_AMBULATORY_CARE_PROVIDER_SITE_OTHER): Payer: Self-pay | Admitting: *Deleted

## 2018-11-24 DIAGNOSIS — Z85038 Personal history of other malignant neoplasm of large intestine: Secondary | ICD-10-CM

## 2018-11-24 DIAGNOSIS — R7989 Other specified abnormal findings of blood chemistry: Secondary | ICD-10-CM

## 2018-11-27 DIAGNOSIS — R000 Tachycardia, unspecified: Secondary | ICD-10-CM | POA: Diagnosis not present

## 2018-11-27 DIAGNOSIS — E876 Hypokalemia: Secondary | ICD-10-CM | POA: Diagnosis not present

## 2018-11-27 DIAGNOSIS — I471 Supraventricular tachycardia: Secondary | ICD-10-CM | POA: Diagnosis not present

## 2018-12-15 ENCOUNTER — Telehealth: Payer: Self-pay | Admitting: Internal Medicine

## 2018-12-15 NOTE — Telephone Encounter (Signed)
New Message:  Patient called today asking what his blood type was. He said it is important that the office call him today with the results

## 2018-12-15 NOTE — Telephone Encounter (Signed)
Informed pt that he has never been type & screened at [facility], so there is no way for [place] to know what his blood type is. Advised to check with PCP to see if they know.  Pt said he would and that he also left message with GI b/c they did a procedure on him. Pt appreciated the return call

## 2018-12-16 ENCOUNTER — Telehealth: Payer: Self-pay | Admitting: Cardiovascular Disease

## 2018-12-16 NOTE — Telephone Encounter (Signed)
Patient called stating that he is having to take 2 or 3 more diltiazem (CARDIZEM CD) 120 MG 24 hr  He is wanting to know if the dosage can be increased.   [phone].

## 2018-12-16 NOTE — Telephone Encounter (Signed)
Pt says for the last few months has been to [facility] ED for HRs of 150s/SOB/palpitations - says he has been taking 2-4 tablets of diltiazem 120 mg daily and says this has helped with HR/palpitations/SOB - wanted a new rx of diltiazem so that he can take 2-3 tablets daily

## 2018-12-16 NOTE — Telephone Encounter (Signed)
Has he resumed alcohol consumption?  When I spoke with him in mid October, he was taking 1 tablet daily as he had stopped drinking.  For the time being, can increase Cardizem CD to 240 mg daily.  Make an appointment for him to see EP and obtain records from [facility] so they can review (ECG's, etc).

## 2018-12-17 ENCOUNTER — Encounter: Payer: Self-pay | Admitting: *Deleted

## 2018-12-17 MED ORDER — DILTIAZEM HCL ER COATED BEADS 240 MG PO CP24: 240.0000 mg | ORAL_CAPSULE | Freq: Every day | ORAL | 1 refills | Status: DC

## 2018-12-17 NOTE — Telephone Encounter (Signed)
Pt says he has been drinking some again and is embarrassed to admit (both parents died this year has had a rough year) dilt sent to [facility] as requested - pt is requesting virtual appt with Dr Rayann Heman (doesn't have drivers license and no one to take him to appts) records from [facility] requested and will forward to schedulers for appt with Dr Rayann Heman

## 2018-12-22 ENCOUNTER — Telehealth (INDEPENDENT_AMBULATORY_CARE_PROVIDER_SITE_OTHER): Payer: Medicaid Other | Admitting: Internal Medicine

## 2018-12-22 ENCOUNTER — Encounter: Payer: Self-pay | Admitting: Internal Medicine

## 2018-12-22 VITALS — Ht 70.0 in | Wt 260.0 lb

## 2018-12-22 DIAGNOSIS — I10 Essential (primary) hypertension: Secondary | ICD-10-CM

## 2018-12-22 DIAGNOSIS — R002 Palpitations: Secondary | ICD-10-CM

## 2018-12-22 DIAGNOSIS — F1010 Alcohol abuse, uncomplicated: Secondary | ICD-10-CM | POA: Diagnosis not present

## 2018-12-22 NOTE — Progress Notes (Signed)
Electrophysiology TeleHealth Note  Due to national recommendations of social distancing due to [place] 19, an audio telehealth visit is felt to be most appropriate for this patient at this time.  Verbal consent was obtained by me for the telehealth visit today.  The patient does not have capability for a virtual visit.  A phone visit is therefore required today.   Date:  12/22/2018   ID:  Earl [name], DOB September 02, 1960, MRN [id number]  Location: patient's home  Provider location:  [facility]  Evaluation Performed: Follow-up visit  PCP:  Baruch Gouty, FNP   Electrophysiologist:  Dr Rayann Heman  Chief Complaint:  palpitations  History of Present Illness:    Earl [name] is a 58 y.o. male who presents via telehealth conferencing today.  Since last being seen in our clinic, the patient reports doing reasonably well.  He lost both parents.  He is not coping well with this.  He is drinking heavy alcohol.  He has lost his drivers license previously due to DUI.   Most recently, 11/27/2018 he had palpitations for which he presented to [facility] ER.  He was in sinus when he got there but with low K and Mg. (records reviewed).  Today, he denies symptoms of chest pain, shortness of breath,  lower extremity edema, dizziness, presyncope, or syncope.  The patient is otherwise without complaint today.  The patient denies symptoms of fevers, chills, cough, or new SOB worrisome for COVID 19.  Past Medical History:  Diagnosis Date  . Alcohol abuse   . Colon cancer ([place])   . Depression   . Gall stones   . Hypertension   . Kidney stone   . Typical atrial flutter [facility])     Past Surgical History:  Procedure Laterality Date  . A-FLUTTER ABLATION N/A 07/31/2016   Procedure: A-Flutter Ablation;  Surgeon: [name], MD;  Location: [place] [facility];  Service: Cardiovascular;  Laterality: N/A;  . COLONOSCOPY N/A 04/17/2016   Procedure: COLONOSCOPY;  Surgeon: [name], MD;  [facility];  Service: Endoscopy;  Laterality: N/A;  1200  . COLONOSCOPY N/A 05/07/2017   Procedure: COLONOSCOPY;  Surgeon: [name], MD;  [facility];  Service: Endoscopy;  Laterality: N/A;  200  . MINOR CARPAL TUNNEL     right  . POLYPECTOMY  04/17/2016   Procedure: POLYPECTOMY;  Surgeon: [name], MD;  [facility];  Service: Endoscopy;;  sigmoid    Current Outpatient Medications  Medication Sig Dispense Refill  . ALPRAZolam (XANAX) 1 MG tablet Take 1 mg by mouth 2 (two) times daily as needed for anxiety.     . chlorthalidone (HYGROTON) 25 MG tablet Take 1 tablet (25 mg total) by mouth daily. 30 tablet 3  . diltiazem (CARDIZEM CD) 240 MG 24 hr capsule Take 1 capsule (240 mg total) by mouth daily. 90 capsule 1  . ketoconazole (NIZORAL) 2 % shampoo Apply 1 application topically as needed.     Marland Kitchen lisinopril (ZESTRIL) 40 MG tablet Take 40 mg by mouth daily.    Marland Kitchen LYRICA 200 MG capsule Take 200 mg by mouth 2 (two) times daily.  2  . zinc gluconate 50 MG tablet Take 50 mg by mouth once a week. Take with vitamin d     Current Facility-Administered Medications  Medication Dose Route Frequency Provider Last Rate Last Dose  . cyanocobalamin ((VITAMIN B-12)) injection 1,000 mcg  1,000 mcg Intramuscular Q30  days [name], MD   1,000 mcg at 09/22/18 1452    Allergies:   Amitriptyline, Amoxicillin, Asa [aspirin], Clonidine derivatives, Hydrochlorothiazide, and Lisinopril   Social History:  The patient  reports that he quit smoking about 39 years ago. He has never used smokeless tobacco. He reports current alcohol use of about 8.0 standard drinks of alcohol per week. He reports that he does not use drugs.   Family History:  [name] family history includes Alzheimer's disease in his mother; Coronary artery disease in his father and paternal grandfather; Heart disease in his father.   ROS:  Please see the history of present illness.   All other systems  are personally reviewed and negative.    Exam:    Vital Signs:  Ht 5\' 10"  (1.778 m)   Wt 260 lb (117.9 kg)   BMI 37.31 kg/m   Well sounding, alert and conversant   Labs/Other Tests and Data Reviewed:    Recent Labs: 10/13/2018: Hemoglobin 16.2; Platelets 266; TSH 7.260 11/03/2018: ALT 127; BUN 14; Creatinine, Ser 1.04; Potassium 3.5; Sodium 138   Wt Readings from Last 3 Encounters:  12/22/18 260 lb (117.9 kg)  11/09/18 265 lb (120.2 kg)  11/03/18 265 lb (120.2 kg)      ASSESSMENT & PLAN:    1.  Palpitations S/p ablation for atrial flutter Recently presented to the ED with palpitations  I have advised implantable loop recorder placement to further evaluate given infrequent nature.  He is interested in ILR implant.  We will arrange at the next available DOD day in [place].  He has no drivers license so transportation is difficult.  2. HTN Stable No change required today  3. Obesity Weight loss is advised  4. ETOH Cessation advised  Follow-up:  3 months with me in [place] We will arrange ILR implant in [facility] in the interim   Patient Risk:  after full review of this patients clinical status, I feel that they are at moderate risk at this time.  Today, I have spent 15 minutes with the patient with telehealth technology discussing arrhythmia management .    Army Fossa, MD  12/22/2018 8:57 AM     [facility] [address] [place] [address] [phone] (office) [phone] (fax)

## 2019-01-05 ENCOUNTER — Ambulatory Visit (INDEPENDENT_AMBULATORY_CARE_PROVIDER_SITE_OTHER): Payer: Medicaid Other | Admitting: Family Medicine

## 2019-01-05 ENCOUNTER — Encounter: Payer: Self-pay | Admitting: Family Medicine

## 2019-01-05 DIAGNOSIS — F339 Major depressive disorder, recurrent, unspecified: Secondary | ICD-10-CM | POA: Diagnosis not present

## 2019-01-05 DIAGNOSIS — F1020 Alcohol dependence, uncomplicated: Secondary | ICD-10-CM | POA: Diagnosis not present

## 2019-01-05 NOTE — Progress Notes (Signed)
Virtual Visit via telephone Note Due to COVID-19 pandemic this visit was conducted virtually. This visit type was conducted due to national recommendations for restrictions regarding the COVID-19 Pandemic (e.g. social distancing, sheltering in place) in an effort to limit this patient's exposure and mitigate transmission in our community. All issues noted in this document were discussed and addressed.  A physical exam was not performed with this format.   I connected with Earl [name] on 01/05/2019 at 1315 by telephone and verified that I am speaking with the correct person using two identifiers. Earl [name] is currently located at home and no one is currently with them during visit. The provider, Monia Pouch, FNP is located in their office at time of visit.  I discussed the limitations, risks, security and privacy concerns of performing an evaluation and management service by telephone and the availability of in person appointments. I also discussed with the patient that there may be a patient responsible charge related to this service. The patient expressed understanding and agreed to proceed.  Subjective:  Patient ID: Earl [name], male    DOB: September 14, 1960, 58 y.o.   MRN: [id number]  Chief Complaint:  Depression   HPI: Earl [name] is a 58 y.o. male presenting on 01/05/2019 for Depression   Pt reports worsening depression since the loss of his father. Pt states he has increased depression, fatigue, and hopelessness. States in the past he has been followed by psychiatry and was able to try any medications he wanted and was able to adjust according to his response. States his psychiatrist would allow him to do this. Pt has not seen psychiatry in several years and is willing to see again. No SI or HI.   Depression        This is a recurrent problem.  The current episode started more than 1 year ago.   The problem occurs daily.  The problem has been gradually worsening since onset.   Associated symptoms include decreased concentration, fatigue, helplessness, hopelessness, insomnia, irritable, restlessness, decreased interest, appetite change, body aches, myalgias, headaches, indigestion and sad.  Associated symptoms include no suicidal ideas.     The symptoms are aggravated by family issues.  Past treatments include SNRIs - Serotonin and norepinephrine reuptake inhibitors, SSRIs - Selective serotonin reuptake inhibitors, TCAs - Tricyclic antidepressants, MAOIs - Monoamine oxidase inhibitors and other medications.  Compliance with treatment is variable.  Risk factors include history of mental illness, major life event, family history of mental illness, a change in medications and alcohol intake.     Relevant past medical, surgical, family, and social history reviewed and updated as indicated.  Allergies and medications reviewed and updated.   Past Medical History:  Diagnosis Date  . Alcohol abuse   . Colon cancer ([place])   . Depression   . Gall stones   . Hypertension   . Kidney stone   . Typical atrial flutter [facility])     Past Surgical History:  Procedure Laterality Date  . A-FLUTTER ABLATION N/A 07/31/2016   Procedure: A-Flutter Ablation;  Surgeon: [name], MD;  Location: [place] [facility];  Service: Cardiovascular;  Laterality: N/A;  . COLONOSCOPY N/A 04/17/2016   Procedure: COLONOSCOPY;  Surgeon: [name], MD;  [facility];  Service: Endoscopy;  Laterality: N/A;  1200  . COLONOSCOPY N/A 05/07/2017   Procedure: COLONOSCOPY;  Surgeon: [name], MD;  [facility];  Service: Endoscopy;  Laterality: N/A;  200  .  MINOR CARPAL TUNNEL     right  . POLYPECTOMY  04/17/2016   Procedure: POLYPECTOMY;  Surgeon: [name], MD;  [facility];  Service: Endoscopy;;  sigmoid    Social History   Socioeconomic History  . Marital status: Single    Spouse name: Not on file  . Number of children: Not on file  . Years of  education: Not on file  . Highest education level: Not on file  Occupational History  . Not on file  Social Needs  . Financial resource strain: Not on file  . Food insecurity    Worry: Not on file    Inability: Not on file  . Transportation needs    Medical: Not on file    Non-medical: Not on file  Tobacco Use  . Smoking status: Former Smoker    Quit date: 03/03/1979    Years since quitting: 39.8  . Smokeless tobacco: Never Used  Substance and Sexual Activity  . Alcohol use: Yes    Alcohol/week: 8.0 standard drinks    Types: 8 Cans of beer per week    Comment: drinks heavily at times  . Drug use: No  . Sexual activity: Not on file  Lifestyle  . Physical activity    Days per week: Not on file    Minutes per session: Not on file  . Stress: Not on file  Relationships  . Social Herbalist on phone: Not on file    Gets together: Not on file    Attends religious service: Not on file    Active member of club or organization: Not on file    Attends meetings of clubs or organizations: Not on file    Relationship status: Not on file  . Intimate partner violence    Fear of current or ex partner: Not on file    Emotionally abused: Not on file    Physically abused: Not on file    Forced sexual activity: Not on file  Other Topics Concern  . Not on file  Social History Narrative   Disabled   Lives in [place]    Outpatient Encounter Medications as of 01/05/2019  Medication Sig  . ALPRAZolam (XANAX) 1 MG tablet Take 1 mg by mouth 2 (two) times daily as needed for anxiety.   Marland Kitchen amLODipine (NORVASC) 5 MG tablet Take 5 mg by mouth daily.  . chlorthalidone (HYGROTON) 25 MG tablet Take 1 tablet (25 mg total) by mouth daily.  . Cholecalciferol (VITAMIN D3) 1.25 MG (50000 UT) CAPS Take 1 capsule by mouth once a week.  . diltiazem (CARDIZEM CD) 240 MG 24 hr capsule Take 1 capsule (240 mg total) by mouth daily.  Marland Kitchen gabapentin (NEURONTIN) 300 MG capsule Take 600 mg by mouth 3 (three)  times daily.  Marland Kitchen ketoconazole (NIZORAL) 2 % shampoo Apply 1 application topically as needed.   Marland Kitchen levothyroxine (SYNTHROID) 25 MCG tablet TAKE 1 TABLET BY MOUTH ONCE DAILY BEFORE BREAKFAST  . lisinopril (ZESTRIL) 40 MG tablet Take 40 mg by mouth daily.  Marland Kitchen losartan (COZAAR) 100 MG tablet Take 100 mg by mouth daily.  Marland Kitchen LYRICA 200 MG capsule Take 200 mg by mouth 2 (two) times daily.  Marland Kitchen zinc gluconate 50 MG tablet Take 50 mg by mouth once a week. Take with vitamin d   Facility-Administered Encounter Medications as of 01/05/2019  Medication  . cyanocobalamin ((VITAMIN B-12)) injection 1,000 mcg    Allergies  Allergen Reactions  .  Amitriptyline Anaphylaxis  . Amoxicillin Anaphylaxis and Rash    Has patient had a PCN reaction causing immediate rash, facial/tongue/throat swelling, SOB or lightheadedness with hypotension: Yes Has patient had a PCN reaction causing severe rash involving mucus membranes or skin necrosis: No Has patient had a PCN reaction that required hospitalization: No Has patient had a PCN reaction occurring within the last 10 years: Yes If all of the above answers are "NO", then may proceed with Cephalosporin use.   Diona Fanti [Aspirin] Anaphylaxis  . Clonidine Derivatives Other (See Comments)    Severe Dry Mouth  . Hydrochlorothiazide     Caused rectal bleeding and hematuria  . Lisinopril     Adverse reaction--causes cough    Review of Systems  Constitutional: Positive for activity change, appetite change and fatigue. Negative for chills, diaphoresis, fever and unexpected weight change.  Eyes: Negative for photophobia and visual disturbance.  Respiratory: Negative for cough, chest tightness and shortness of breath.   Cardiovascular: Negative for chest pain, palpitations and leg swelling.  Gastrointestinal: Negative for abdominal pain, constipation, diarrhea, nausea and vomiting.  Endocrine: Negative for cold intolerance, heat intolerance, polydipsia, polyphagia and polyuria.   Genitourinary: Negative for decreased urine volume and difficulty urinating.  Musculoskeletal: Positive for myalgias. Negative for arthralgias.  Neurological: Positive for headaches. Negative for dizziness, tremors, seizures, syncope, facial asymmetry, speech difficulty, weakness, light-headedness and numbness.  Psychiatric/Behavioral: Positive for agitation, behavioral problems, decreased concentration, depression, dysphoric mood and sleep disturbance. Negative for confusion, hallucinations, self-injury and suicidal ideas. The patient is nervous/anxious and has insomnia. The patient is not hyperactive.   All other systems reviewed and are negative.        Observations/Objective: No vital signs or physical exam, this was a telephone or virtual health encounter.  Pt alert and oriented, answers all questions appropriately, and able to speak in full sentences.    Assessment and Plan: Earl [name] was seen today for depression.  Diagnoses and all orders for this visit:  Depression, recurrent ([place]) Pt requesting to be restarted on antipsychotics. Pt has not been on medications in several years. Pt offered SSRI or SNRI therapy and declined. States these medications do not work for him and he does not wish to try them. Pt willing to see psychiatry, urgent referral placed.  -     Ambulatory referral to Psychiatry     Follow Up Instructions: Return if symptoms worsen or fail to improve.    I discussed the assessment and treatment plan with the patient. The patient was provided an opportunity to ask questions and all were answered. The patient agreed with the plan and demonstrated an understanding of the instructions.   The patient was advised to call back or seek an in-person evaluation if the symptoms worsen or if the condition fails to improve as anticipated.  The above assessment and management plan was discussed with the patient. The patient verbalized understanding of and has agreed to the  management plan. Patient is aware to call the clinic if they develop any new symptoms or if symptoms persist or worsen. Patient is aware when to return to the clinic for a follow-up visit. Patient educated on when it is appropriate to go to the emergency department.    I provided 25 minutes of non-face-to-face time during this encounter. The call started at 1315. The call ended at 1340. The other time was used for coordination of care.    Monia Pouch, FNP-C [place],  [place] [address] [phone] 01/05/2019

## 2019-01-06 ENCOUNTER — Telehealth: Payer: Self-pay | Admitting: Internal Medicine

## 2019-01-06 ENCOUNTER — Ambulatory Visit (INDEPENDENT_AMBULATORY_CARE_PROVIDER_SITE_OTHER): Payer: Medicaid Other | Admitting: Nurse Practitioner

## 2019-01-06 NOTE — Telephone Encounter (Signed)
New message   Patient has questions about loop implant and what to discuss if this loop implant is necessary at this time. Please call.

## 2019-01-07 ENCOUNTER — Emergency Department (HOSPITAL_COMMUNITY): Payer: Medicaid Other

## 2019-01-07 ENCOUNTER — Encounter (HOSPITAL_COMMUNITY): Payer: Self-pay | Admitting: *Deleted

## 2019-01-07 ENCOUNTER — Emergency Department (HOSPITAL_COMMUNITY)
Admission: EM | Admit: 2019-01-07 | Discharge: 2019-01-07 | Disposition: A | Discharge status: Home / Self Care | Payer: Medicaid Other | Attending: Emergency Medicine | Admitting: Emergency Medicine

## 2019-01-07 ENCOUNTER — Telehealth: Payer: Self-pay | Admitting: Internal Medicine

## 2019-01-07 ENCOUNTER — Other Ambulatory Visit: Payer: Self-pay

## 2019-01-07 DIAGNOSIS — Z87891 Personal history of nicotine dependence: Secondary | ICD-10-CM | POA: Insufficient documentation

## 2019-01-07 DIAGNOSIS — S299XXA Unspecified injury of thorax, initial encounter: Secondary | ICD-10-CM | POA: Diagnosis not present

## 2019-01-07 DIAGNOSIS — R0781 Pleurodynia: Secondary | ICD-10-CM | POA: Diagnosis not present

## 2019-01-07 DIAGNOSIS — I10 Essential (primary) hypertension: Secondary | ICD-10-CM | POA: Diagnosis not present

## 2019-01-07 DIAGNOSIS — R0902 Hypoxemia: Secondary | ICD-10-CM | POA: Diagnosis not present

## 2019-01-07 DIAGNOSIS — Y999 Unspecified external cause status: Secondary | ICD-10-CM | POA: Diagnosis not present

## 2019-01-07 DIAGNOSIS — Y9355 Activity, bike riding: Secondary | ICD-10-CM | POA: Insufficient documentation

## 2019-01-07 DIAGNOSIS — R5381 Other malaise: Secondary | ICD-10-CM | POA: Diagnosis not present

## 2019-01-07 DIAGNOSIS — R52 Pain, unspecified: Secondary | ICD-10-CM | POA: Diagnosis not present

## 2019-01-07 DIAGNOSIS — S20211A Contusion of right front wall of thorax, initial encounter: Secondary | ICD-10-CM | POA: Diagnosis not present

## 2019-01-07 DIAGNOSIS — Y929 Unspecified place or not applicable: Secondary | ICD-10-CM | POA: Diagnosis not present

## 2019-01-07 MED ORDER — OXYCODONE-ACETAMINOPHEN 5-325 MG PO TABS: 1.0000 | ORAL_TABLET | Freq: Four times a day (QID) | ORAL | 0 refills | Status: DC | PRN

## 2019-01-07 MED ORDER — OXYCODONE-ACETAMINOPHEN 5-325 MG PO TABS
1.0000 | ORAL_TABLET | Freq: Once | ORAL | Status: AC
  Administered 2019-01-07: 1 via ORAL
  Filled 2019-01-07 (×2): qty 1

## 2019-01-07 MED ORDER — LIDOCAINE 5 % EX OINT: 1.0000 "application " | TOPICAL_OINTMENT | Freq: Three times a day (TID) | CUTANEOUS | 0 refills | Status: DC | PRN
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
## 2019-01-07 NOTE — Discharge Instructions (Signed)
Your workup today showed that you have a fracture to one or more ribs.  Unfortunately this type of injury hurts but there is no way to fix it immediately; it must heal over time.  Be sure to take plenty of deep breaths so that you get rid of the "bad air" in your lungs.  If you are given a device called an incentive spirometer, please use it as recommended.  Unless you have been told by your doctor not to do so, we recommend you take ibuprofen 600 mg 3 times daily with meals for no more than 5 days.  You can also take Tylenol 1000 mg every 6 hours for pain.  Follow-up at the clinics or with the doctors described in this paperwork.  Return to the emergency department if he develop new or worsening symptoms that concern you.

## 2019-01-07 NOTE — Telephone Encounter (Signed)
Pt calling in reporting HRs 150s. Pulse Ox showing O2 sats 91-92%. States this is not new for him.  Symptoms are "fast heart rate & "chest feels weird", "not painful chest feeling",  "feels like I am out of breath but I am not out of breath".   Denies SOB, dizziness/light-headedness, near syncope/syncope. States it started about 15 min ago.  He is going to rest and try and get his O2 sats up.  States when he "gets O2 sats to 93 or more it usually will break".  Asked pt if he had been drinking, pt reports drinking yesterday.  Drank "1/2 pint liquor". Advised pt to call office back if did not "break" soon and/or symptoms begin (other that heart racing).  Advised if does worsen he may also go to ED for evaluation.  Otherwise pt understands I will forward to [name] nurse Sonia Baller to call him to address in-office ILR implant scheduling. Patient verbalized understanding and agreeable to plan.
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
## 2019-01-07 NOTE — Telephone Encounter (Signed)
New Message    STAT if HR is under 50 or over 120 (normal HR is 60-100 beats per minute)  1) What is your heart rate? 155   2) Do you have a log of your heart rate readings (document readings)? No   3) Do you have any other symptoms?  Pt says no other symptoms. He says his chest feels a little weird and a little out of breath but he is not out of breath. He says it just feels this way. He says he thinks something Is going on and wants to get the loop recorder     Please call

## 2019-01-10 ENCOUNTER — Telehealth: Payer: Self-pay | Admitting: Family Medicine

## 2019-01-10 NOTE — Telephone Encounter (Signed)
Returned call to Pt.  Pt would like to keep upcoming appt for loop implant on 12/28.  He states he is going to try to find a ride.  He will call to cancel if he is unable to find transportation.

## 2019-01-11 ENCOUNTER — Telehealth: Payer: Self-pay | Admitting: Family Medicine

## 2019-01-12 NOTE — Telephone Encounter (Signed)
LM for [facility] and have no received a call back.

## 2019-01-12 NOTE — Telephone Encounter (Signed)
Just let him know the referral has been placed and we are awaiting an appointment.

## 2019-01-12 NOTE — Telephone Encounter (Signed)
Will do!

## 2019-01-14 ENCOUNTER — Encounter (INDEPENDENT_AMBULATORY_CARE_PROVIDER_SITE_OTHER): Payer: Self-pay | Admitting: *Deleted

## 2019-01-14 ENCOUNTER — Other Ambulatory Visit (INDEPENDENT_AMBULATORY_CARE_PROVIDER_SITE_OTHER): Payer: Self-pay | Admitting: *Deleted

## 2019-01-14 DIAGNOSIS — Z85038 Personal history of other malignant neoplasm of large intestine: Secondary | ICD-10-CM

## 2019-01-14 DIAGNOSIS — F1011 Alcohol abuse, in remission: Secondary | ICD-10-CM

## 2019-01-14 DIAGNOSIS — R7989 Other specified abnormal findings of blood chemistry: Secondary | ICD-10-CM

## 2019-01-24 ENCOUNTER — Telehealth: Payer: Self-pay | Admitting: Family Medicine

## 2019-01-24 ENCOUNTER — Other Ambulatory Visit: Payer: Self-pay

## 2019-01-24 ENCOUNTER — Ambulatory Visit (INDEPENDENT_AMBULATORY_CARE_PROVIDER_SITE_OTHER): Payer: Medicaid Other | Admitting: Internal Medicine

## 2019-01-24 ENCOUNTER — Encounter: Payer: Self-pay | Admitting: Internal Medicine

## 2019-01-24 VITALS — BP 134/76 | HR 88 | Ht 70.0 in | Wt 272.6 lb

## 2019-01-24 DIAGNOSIS — I10 Essential (primary) hypertension: Secondary | ICD-10-CM | POA: Diagnosis not present

## 2019-01-24 DIAGNOSIS — R002 Palpitations: Secondary | ICD-10-CM

## 2019-01-24 DIAGNOSIS — Z7689 Persons encountering health services in other specified circumstances: Secondary | ICD-10-CM | POA: Diagnosis not present

## 2019-01-24 DIAGNOSIS — F1010 Alcohol abuse, uncomplicated: Secondary | ICD-10-CM

## 2019-01-24 HISTORY — PX: OTHER SURGICAL HISTORY: SHX169

## 2019-01-24 NOTE — Patient Instructions (Addendum)
Medication Instructions:  Your physician recommends that you continue on your current medications as directed. Please refer to the Current Medication list given to you today.  Labwork: None ordered.  Testing/Procedures: None ordered.  Follow-Up:  You will have a virtual visit with device clinic for a wound check.    February 03, 2019 at 4:00 pm.  Your physician wants you to follow-up in:   Virtual visit - May 13, 2019 at 1135 with Dr Rayann Heman.  Any Other Special Instructions Will Be Listed Below (If Applicable).  If you need a refill on your cardiac medications before your next appointment, please call your pharmacy.    Implantable Loop Recorder Placement, Care After This sheet gives you information about how to care for yourself after your procedure. Your health care provider may also give you more specific instructions. If you have problems or questions, contact your health care provider. What can I expect after the procedure? After the procedure, it is common to have:  Soreness or discomfort near the incision.  Some swelling or bruising near the incision. Follow these instructions at home: Incision care   Follow instructions from your health care provider about how to take care of your incision. Make sure you: ? Leave your outer dressing on for 24 hours.  After 24 hours you can remove that and shower. ? Leave adhesive strips in place. These skin closures may need to stay in place for 2 weeks or longer. If adhesive strip edges start to loosen and curl up, you may trim the loose edges. Do not remove adhesive strips completely unless your health care provider tells you to do that.  Check your incision area every day for signs of infection. Check for: ? Redness, swelling, or pain. ? Fluid or blood. ? Warmth. ? Pus or a bad smell.  Do not take baths, swim, or use a hot tub until your incision is completely healed.  Activity  Return to your normal activities.  General  instructions  Follow instructions from your health care provider about how to manage your implantable loop recorder and transmit the information. Learn how to activate a recording if this is necessary for your type of device.  Do not go through a metal detection gate, and do not let someone hold a metal detector over your chest. Show your ID card.  Do not have an MRI unless you check with your health care provider first.  Take over-the-counter and prescription medicines only as told by your health care provider.  Keep all follow-up visits as told by your health care provider. This is important. Contact a health care provider if:  You have redness, swelling, or pain around your incision.  You have a fever.  You have pain that is not relieved by your pain medicine.  You have triggered your device because of fainting (syncope) or because of a heartbeat that feels like it is racing, slow, fluttering, or skipping (palpitations). Get help right away if you have:  Chest pain.  Difficulty breathing. Summary  After the procedure, it is common to have soreness or discomfort near the incision.  Change your dressing as told by your health care provider.  Follow instructions from your health care provider about how to manage your implantable loop recorder and transmit the information.  Keep all follow-up visits as told by your health care provider. This is important. This information is not intended to replace advice given to you by your health care provider. Make sure you discuss any  questions you have with your health care provider. Document Released: 12/25/2014 Document Revised: 02/28/2017 Document Reviewed: 02/28/2017 Elsevier Patient Education  2020 [employer].

## 2019-01-24 NOTE — Telephone Encounter (Signed)
Pt called stating that he was told at his last visit that someone was going to check on getting him sent to a [place], but hasn't heard anything from anyone. Wants to speak with someone about this.

## 2019-01-24 NOTE — Telephone Encounter (Signed)
I have LM x2 for Earl [name] regarding [place] [facility] & have not received a call back as to when he can be scheduled for an appt.

## 2019-01-24 NOTE — Telephone Encounter (Signed)
Thanks

## 2019-01-24 NOTE — Progress Notes (Signed)
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 male who presents today for routine electrophysiology followup.  Since last being seen in our clinic, the patient reports doing reasonably well.  He continues to have social stress and drinks heavily.  He has had increasing palpitations of unclear significance.  These cause anxiety for him and have led to a recent ER visit at which time his symptoms had resolved. Today, he denies symptoms of chest pain, shortness of breath,  lower extremity edema, dizziness, presyncope, or syncope.  The patient is otherwise without complaint today.   Past Medical History:  Diagnosis Date  . Alcohol abuse   . Colon cancer ([place])   . Depression   . Gall stones   . Hypertension   . Kidney stone   . Typical atrial flutter [facility])    Past Surgical History:  Procedure Laterality Date  . A-FLUTTER ABLATION N/A 07/31/2016   Procedure: A-Flutter Ablation;  Surgeon: [name], MD;  Location: [facility];  Service: Cardiovascular;  Laterality: N/A;  . COLONOSCOPY N/A 04/17/2016   Procedure: COLONOSCOPY;  Surgeon: [name], MD;  [facility];  Service: Endoscopy;  Laterality: N/A;  1200  . COLONOSCOPY N/A 05/07/2017   Procedure: COLONOSCOPY;  Surgeon: [name], MD;  [facility];  Service: Endoscopy;  Laterality: N/A;  200  . MINOR CARPAL TUNNEL     right  . POLYPECTOMY  04/17/2016   Procedure: POLYPECTOMY;  Surgeon: [name], MD;  [facility];  Service: Endoscopy;;  sigmoid    ROS- all systems are reviewed and negatives except as per HPI above  Current Outpatient Medications  Medication Sig Dispense Refill  . ALPRAZolam (XANAX) 1 MG tablet Take 1 mg by mouth 2 (two) times daily as needed for anxiety.     Marland Kitchen amLODipine (NORVASC) 5 MG tablet Take 5 mg by mouth daily.    . chlorthalidone (HYGROTON) 25 MG tablet Take 1 tablet (25 mg total) by mouth daily. 30 tablet 3  .  Cholecalciferol (VITAMIN D3) 1.25 MG (50000 UT) CAPS Take 1 capsule by mouth once a week.    . diltiazem (CARDIZEM CD) 240 MG 24 hr capsule Take 1 capsule (240 mg total) by mouth daily. 90 capsule 1  . gabapentin (NEURONTIN) 300 MG capsule Take 600 mg by mouth 3 (three) times daily.    Marland Kitchen ketoconazole (NIZORAL) 2 % shampoo Apply 1 application topically as needed.     Marland Kitchen levothyroxine (SYNTHROID) 25 MCG tablet TAKE 1 TABLET BY MOUTH ONCE DAILY BEFORE BREAKFAST    . lidocaine (XYLOCAINE) 5 % ointment Apply 1 application topically 3 (three) times daily as needed. 35.44 g 0  . lisinopril (ZESTRIL) 40 MG tablet Take 40 mg by mouth daily.    Marland Kitchen LYRICA 200 MG capsule Take 200 mg by mouth 2 (two) times daily.  2  . Magnesium Hydroxide (MAGNESIA PO) Take 1 tablet by mouth daily.    . Omega-3 Fatty Acids (OMEGA 3 PO) Take by mouth daily.    Marland Kitchen oxyCODONE-acetaminophen (PERCOCET/ROXICET) 5-325 MG tablet Take 1 tablet by mouth every 6 (six) hours as needed for severe pain. 12 tablet 0  . Potassium (POTASSIMIN PO) Take 3 tablets by mouth daily.    . Red Yeast Rice Extract (RED YEAST RICE PO) Take by mouth.    . zinc gluconate 50 MG tablet Take 50 mg by mouth  once a week. Take with vitamin d     Current Facility-Administered Medications  Medication Dose Route Frequency Provider Last Rate Last Admin  . cyanocobalamin ((VITAMIN B-12)) injection 1,000 mcg  1,000 mcg Intramuscular Q30 days [name], MD   1,000 mcg at 09/22/18 1452    Physical Exam: Vitals:   01/24/19 1037  BP: 134/76  Pulse: 88  SpO2: 97%  Weight: 272 lb 9.6 oz (123.7 kg)  Height: 5\' 10"  (1.778 m)    GEN- The patient is well appearing, alert and oriented x 3 today.   Head- normocephalic, atraumatic Eyes-  Sclera clear, conjunctiva pink Ears- hearing intact Oropharynx- clear Lungs- Clear to ausculation bilaterally, normal work of breathing Heart- Regular rate and rhythm, no murmurs, rubs or gallops, PMI not laterally  displaced GI- soft, NT, ND, + BS Extremities- no clubbing, cyanosis, or edema  Wt Readings from Last 3 Encounters:  01/24/19 272 lb 9.6 oz (123.7 kg)  12/22/18 260 lb (117.9 kg)  11/09/18 265 lb (120.2 kg)    EKG tracing ordered today is personally reviewed and shows sinus rhythm  Assessment and Plan:  1. Palpitations Unclear etiology I am concerned about atrial arrhythmias as the cause, since he had prior atrial tach and CTI ablations performed by me in 2018.  His palpitations are infrequent and would not likely be found on short term monitor.  He has presented previously to the ED and was monitored but his arrhythmia had resolved at that time.  I would therefore advise implantation of an implantable loop recorder for long term arrhythmia monitoring.  Risks and benefits to ILR were discussed at length with the patient today, including but not limited to risks of bleeding and infection.  Extensive device education was performed.  Remote monitoring was also discussed at length today.  The patient understands and wishes to proceed.  We will proceed at this time with ILR implantation.  2. HTN Stable No change required today  3. Obesity Lifestyle modification encouraged Body mass index is 39.11 kg/m.  4. ETOH Cessation advised  [name] MD, [facility] 01/24/2019 11:09 AM      DESCRIPTION OF PROCEDURE:  Informed written consent was obtained.  The patient required no sedation for the procedure today.  The patients left chest was prepped and draped. Mapping over the patient's chest was performed to identify the appropriate ILR site.  This area was found to be the left parasternal region over the 3rd-4th intercostal space.  The skin overlying this region was infiltrated with lidocaine for local analgesia.  A 0.5-cm incision was made at the implant site.  A subcutaneous ILR pocket was fashioned using a combination of sharp and blunt dissection.  A Medtronic Reveal Linq model LNQ 11  implantable loop recorder (SN [id number] ) was then placed into the pocket R waves were very prominent and measured > 0.2 mV. EBL<1 ml.  Steri- Strips and a sterile dressing were then applied.  There were no early apparent complications.     CONCLUSIONS:   1. Successful implantation of a Medtronic Reveal LINQ implantable loop recorder for further evaluation of palpitations post ablation.  2. No early apparent complications.   [name] MD, [facility] 01/24/2019 11:09 AM

## 2019-01-31 ENCOUNTER — Telehealth: Payer: Self-pay | Admitting: Internal Medicine

## 2019-01-31 NOTE — Telephone Encounter (Signed)
I see that on 01/24/19 looks like PCP may have been trying to reach the pt. I will forward this call to PCP.

## 2019-01-31 NOTE — Telephone Encounter (Signed)
New Message  Patient is returning 3 missed calls from someone in the office. I see no notes on the chart that say who it was and patient did not receive a vm. If called, please give patient a call back.

## 2019-01-31 NOTE — Telephone Encounter (Signed)
Earl [name],   I feel this was in reference to the [facility] referral.  [name]

## 2019-02-01 ENCOUNTER — Ambulatory Visit (INDEPENDENT_AMBULATORY_CARE_PROVIDER_SITE_OTHER): Payer: Medicaid Other | Admitting: Internal Medicine

## 2019-02-01 ENCOUNTER — Telehealth: Payer: Self-pay | Admitting: Family Medicine

## 2019-02-01 NOTE — Telephone Encounter (Signed)
REFERRAL REQUEST Telephone Note  What type of referral do you need? Mental health  Have you been seen at our office for this problem? yes (Advise that they will likely need an appointment with their PCP before a referral can be done)  Is there a particular doctor or location that you prefer? In [place] part of [place]  Patient notified that referrals can take up to a week or longer to process. If they haven't heard anything within a week they should call back and speak with the referral department.

## 2019-02-02 ENCOUNTER — Encounter (INDEPENDENT_AMBULATORY_CARE_PROVIDER_SITE_OTHER): Payer: Self-pay

## 2019-02-02 ENCOUNTER — Telehealth: Payer: Self-pay | Admitting: Family Medicine

## 2019-02-02 NOTE — Telephone Encounter (Signed)
Tried Calling Patient to give him [facility] scheduling Number but had to LM

## 2019-02-03 ENCOUNTER — Other Ambulatory Visit: Payer: Self-pay

## 2019-02-03 ENCOUNTER — Telehealth (INDEPENDENT_AMBULATORY_CARE_PROVIDER_SITE_OTHER): Payer: Medicaid Other | Admitting: *Deleted

## 2019-02-03 ENCOUNTER — Telehealth: Payer: Self-pay | Admitting: *Deleted

## 2019-02-03 DIAGNOSIS — Z95818 Presence of other cardiac implants and grafts: Secondary | ICD-10-CM

## 2019-02-03 DIAGNOSIS — R002 Palpitations: Secondary | ICD-10-CM

## 2019-02-03 LAB — CUP PACEART INCLINIC DEVICE CHECK
Date Time Interrogation Session: 20210107171901
Implantable Pulse Generator Implant Date: 20201228

## 2019-02-03 NOTE — Telephone Encounter (Signed)
Attempted to reach patient to advise that wound photo and manual LINQ transmission were received. No answer, no option to LM.

## 2019-02-03 NOTE — Progress Notes (Signed)
ILR wound check via virtual visit. Steri strips removed by patient. Wound appears well healed. Manual transmission requested as home monitor is not up to date. Patient reports he does not go to bed until 3:00am, will try to keep monitor near him at night. Manual transmission received, no episodes. Patient education provided. Monthly summary reports and ROV with [name] on 05/13/19.

## 2019-02-04 ENCOUNTER — Ambulatory Visit (INDEPENDENT_AMBULATORY_CARE_PROVIDER_SITE_OTHER): Payer: Medicaid Other | Admitting: Family Medicine

## 2019-02-04 ENCOUNTER — Encounter: Payer: Self-pay | Admitting: Family Medicine

## 2019-02-04 DIAGNOSIS — F339 Major depressive disorder, recurrent, unspecified: Secondary | ICD-10-CM | POA: Diagnosis not present

## 2019-02-04 DIAGNOSIS — E782 Mixed hyperlipidemia: Secondary | ICD-10-CM

## 2019-02-04 DIAGNOSIS — I10 Essential (primary) hypertension: Secondary | ICD-10-CM | POA: Diagnosis not present

## 2019-02-04 DIAGNOSIS — F1011 Alcohol abuse, in remission: Secondary | ICD-10-CM | POA: Insufficient documentation

## 2019-02-04 DIAGNOSIS — E039 Hypothyroidism, unspecified: Secondary | ICD-10-CM | POA: Diagnosis not present

## 2019-02-04 DIAGNOSIS — I4892 Unspecified atrial flutter: Secondary | ICD-10-CM | POA: Diagnosis not present

## 2019-02-04 DIAGNOSIS — F1020 Alcohol dependence, uncomplicated: Secondary | ICD-10-CM

## 2019-02-04 NOTE — Progress Notes (Signed)
Virtual Visit via telephone Note Due to COVID-19 pandemic this visit was conducted virtually. This visit type was conducted due to national recommendations for restrictions regarding the COVID-19 Pandemic (e.g. social distancing, sheltering in place) in an effort to limit this patient's exposure and mitigate transmission in our community. All issues noted in this document were discussed and addressed.  A physical exam was not performed with this format.   I connected with Earl [name] on 02/04/2019 at 1335 by telephone and verified that I am speaking with the correct person using two identifiers. Earl [name] is currently located at home and no one is currently with them during visit. The provider, Monia Pouch, FNP is located in their office at time of visit.  I discussed the limitations, risks, security and privacy concerns of performing an evaluation and management service by telephone and the availability of in person appointments. I also discussed with the patient that there may be a patient responsible charge related to this service. The patient expressed understanding and agreed to proceed.  Subjective:  Patient ID: Earl [name], male    DOB: 02/15/1960, 59 y.o.   MRN: [id number]  Chief Complaint:  No chief complaint on file.   HPI: Earl [name] is a 59 y.o. male presenting on 02/04/2019 for No chief complaint on file.  1. Depression, recurrent ([place]) Pt reports he is still having significant depression and has yet to see psychiatry. Pt aware he has been called several times to set up an appointment to see psychiatry. He states he was unaware of these calls and would like to see behavioral health in [place]. He denies SI or HI.  Depression screen [facility] 2/9 02/04/2019 01/05/2019 11/03/2018 10/13/2018 02/03/2018  Decreased Interest 3 2 0 0 0  Down, Depressed, Hopeless 3 2 0 1 0  PHQ - 2 Score 6 4 0 1 0  Altered sleeping 3 2 - - -  Tired, decreased energy 3 2 - 1 -  Change in  appetite 1 2 - 0 -  Feeling bad or failure about yourself  2 2 - 0 -  Trouble concentrating - 2 - 0 -  Moving slowly or fidgety/restless 1 2 - 0 -  Suicidal thoughts 0 0 - 0 -  PHQ-9 Score 16 16 - - -  Difficult doing work/chores - - - - -    2. Morbid obesity ([place]) Does not exercise or watch his diet. Reports he does not feel his weight has changed.   3. Essential hypertension Has not been checking blood pressure at home. Reports compliance with medications without adverse side effects. Has followed up with cardiology. Does not watch diet or exercise. No headache, chest pain, confusion, or leg swelling.   4. Mixed hyperlipidemia Takes Red Yeast Rice and omega 3 fatty acids. Does not watch diet or exercise. Pt declines statin therapy.   5. ETOHism ([place]) Pt reports he continues to drink. States he has been drinking more due to his worsening depression. States he does not have the desire to quit drinking.   6. Acquired hypothyroidism Compliant with medications. Does have fatigue. Weight has been stable. No constipation or diarrhea. No reported skin or nail changes.   7. Atrial flutter, unspecified type ([place]) Followed by cardiology. Is on diltiazem and this does well with rate control. No palpitations, chest pain, dizziness, or syncope.    Relevant past medical, surgical, family, and social history reviewed and updated as indicated.  Allergies and medications reviewed and  updated.   Past Medical History:  Diagnosis Date  . Alcohol abuse   . Colon cancer ([place])   . Depression   . Gall stones   . Hypertension   . Kidney stone   . Typical atrial flutter [name])     Past Surgical History:  Procedure Laterality Date  . A-FLUTTER ABLATION N/A 07/31/2016   Procedure: A-Flutter Ablation;  Surgeon: [name], MD;  Location: [place] CV LAB;  Service: Cardiovascular;  Laterality: N/A;  . COLONOSCOPY N/A 04/17/2016   Procedure: COLONOSCOPY;  Surgeon: [name], MD;  [facility];  Service: Endoscopy;  Laterality: N/A;  1200  . COLONOSCOPY N/A 05/07/2017   Procedure: COLONOSCOPY;  Surgeon: [name], MD;  [facility];  Service: Endoscopy;  Laterality: N/A;  200  . implantable loop recorder placement  01/24/2019    Medtronic Reveal Linq model LNQ 11 implantable loop recorder (SN [id number] ) by Dr Rayann Heman for evaluation of palpitations  . MINOR CARPAL TUNNEL     right  . POLYPECTOMY  04/17/2016   Procedure: POLYPECTOMY;  Surgeon: [name], MD;  [facility];  Service: Endoscopy;;  sigmoid    Social History   Socioeconomic History  . Marital status: Single    Spouse name: Not on file  . Number of children: Not on file  . Years of education: Not on file  . Highest education level: Not on file  Occupational History  . Not on file  Tobacco Use  . Smoking status: Former Smoker    Quit date: 03/03/1979    Years since quitting: 39.9  . Smokeless tobacco: Never Used  Substance and Sexual Activity  . Alcohol use: Yes    Alcohol/week: 8.0 standard drinks    Types: 8 Cans of beer per week    Comment: drinks heavily at times  . Drug use: No  . Sexual activity: Not on file  Other Topics Concern  . Not on file  Social History Narrative   Disabled   Lives in [place] Determinants of Health   Financial Resource Strain:   . Difficulty of Paying Living Expenses: Not on file  Food Insecurity:   . Worried About Charity fundraiser in the Last Year: Not on file  . Ran Out of Food in the Last Year: Not on file  Transportation Needs:   . Lack of Transportation (Medical): Not on file  . Lack of Transportation (Non-Medical): Not on file  Physical Activity:   . Days of Exercise per Week: Not on file  . Minutes of Exercise per Session: Not on file  Stress:   . Feeling of Stress : Not on file  Social Connections:   . Frequency of Communication with Friends and Family: Not on file  . Frequency of Social Gatherings  with Friends and Family: Not on file  . Attends Religious Services: Not on file  . Active Member of Clubs or Organizations: Not on file  . Attends Archivist Meetings: Not on file  . Marital Status: Not on file  Intimate Partner Violence:   . Fear of Current or Ex-Partner: Not on file  . Emotionally Abused: Not on file  . Physically Abused: Not on file  . Sexually Abused: Not on file    Outpatient Encounter Medications as of 02/04/2019  Medication Sig  . ALPRAZolam (XANAX) 1 MG tablet Take 1 mg by mouth 2 (two) times daily as needed for  anxiety.   Marland Kitchen amLODipine (NORVASC) 5 MG tablet Take 5 mg by mouth daily.  . chlorthalidone (HYGROTON) 25 MG tablet Take 1 tablet (25 mg total) by mouth daily.  . Cholecalciferol (VITAMIN D3) 1.25 MG (50000 UT) CAPS Take 1 capsule by mouth once a week.  . diltiazem (CARDIZEM CD) 240 MG 24 hr capsule Take 1 capsule (240 mg total) by mouth daily.  Marland Kitchen gabapentin (NEURONTIN) 300 MG capsule Take 600 mg by mouth 3 (three) times daily.  Marland Kitchen ketoconazole (NIZORAL) 2 % shampoo Apply 1 application topically as needed.   Marland Kitchen levothyroxine (SYNTHROID) 25 MCG tablet TAKE 1 TABLET BY MOUTH ONCE DAILY BEFORE BREAKFAST  . lidocaine (XYLOCAINE) 5 % ointment Apply 1 application topically 3 (three) times daily as needed.  Marland Kitchen lisinopril (ZESTRIL) 40 MG tablet Take 40 mg by mouth daily.  Marland Kitchen LYRICA 200 MG capsule Take 200 mg by mouth 2 (two) times daily.  . Magnesium Hydroxide (MAGNESIA PO) Take 1 tablet by mouth daily.  . Omega-3 Fatty Acids (OMEGA 3 PO) Take by mouth daily.  Marland Kitchen oxyCODONE-acetaminophen (PERCOCET/ROXICET) 5-325 MG tablet Take 1 tablet by mouth every 6 (six) hours as needed for severe pain.  Marland Kitchen Potassium (POTASSIMIN PO) Take 3 tablets by mouth daily.  . Red Yeast Rice Extract (RED YEAST RICE PO) Take by mouth.  . zinc gluconate 50 MG tablet Take 50 mg by mouth once a week. Take with vitamin d   Facility-Administered Encounter Medications as of 02/04/2019    Medication  . cyanocobalamin ((VITAMIN B-12)) injection 1,000 mcg    Allergies  Allergen Reactions  . Amitriptyline Anaphylaxis  . Amoxicillin Anaphylaxis and Rash    Has patient had a PCN reaction causing immediate rash, facial/tongue/throat swelling, SOB or lightheadedness with hypotension: Yes Has patient had a PCN reaction causing severe rash involving mucus membranes or skin necrosis: No Has patient had a PCN reaction that required hospitalization: No Has patient had a PCN reaction occurring within the last 10 years: Yes If all of the above answers are "NO", then may proceed with Cephalosporin use.   Diona Fanti [Aspirin] Anaphylaxis  . Clonidine Derivatives Other (See Comments)    Severe Dry Mouth  . Hydrochlorothiazide     Caused rectal bleeding and hematuria  . Lisinopril     Adverse reaction--causes cough    Review of Systems  Constitutional: Positive for activity change, appetite change and fatigue. Negative for chills, diaphoresis, fever and unexpected weight change.  HENT: Negative.   Eyes: Negative.  Negative for photophobia and visual disturbance.  Respiratory: Negative for cough, chest tightness, shortness of breath and wheezing.   Cardiovascular: Negative for chest pain, palpitations and leg swelling.  Gastrointestinal: Negative for abdominal pain, blood in stool, constipation, diarrhea, nausea and vomiting.  Endocrine: Negative.  Negative for cold intolerance, heat intolerance, polydipsia, polyphagia and polyuria.  Genitourinary: Negative for decreased urine volume, difficulty urinating, dysuria, frequency and urgency.  Musculoskeletal: Negative for arthralgias and myalgias.  Skin: Negative.  Negative for color change.  Allergic/Immunologic: Negative.   Neurological: Negative for dizziness, tremors, seizures, syncope, facial asymmetry, speech difficulty, weakness, numbness and headaches.  Hematological: Negative.   Psychiatric/Behavioral: Positive for agitation,  behavioral problems, decreased concentration, dysphoric mood and sleep disturbance. Negative for confusion, hallucinations, self-injury and suicidal ideas. The patient is nervous/anxious. The patient is not hyperactive.   All other systems reviewed and are negative.        Observations/Objective: No vital signs or physical exam, this was a telephone or  virtual health encounter.  Pt alert and oriented, answers all questions appropriately, and able to speak in full sentences.    Assessment and Plan: Diagnoses and all orders for this visit:  Depression, recurrent ([place]) Additional referral placed today. No SI or HI. Has started drinking more due to increased depression. Urgent referral made.  -     Ambulatory referral to Psychiatry  Morbid obesity ([place]) Diet and exercise encouraged. Labs ordered, pt aware he needs to have these completed.  -     CBC with Differential/Platelet; Future -     CMP14+EGFR; Future -     Lipid panel; Future -     Thyroid Panel With TSH; Future  Essential hypertension Labs ordered. Diet and exercise encouraged. Continue current medications.  -     CBC with Differential/Platelet; Future -     CMP14+EGFR; Future  Mixed hyperlipidemia Diet and exercise encouraged. Labs pending. Continue current therapy.  -     Lipid panel; Future  ETOHism ([place]) Pt encouraged to cut back or stop drinking. Referral to psychiatry.  -     Ambulatory referral to Psychiatry  Acquired hypothyroidism Continue current repletion therapy. Labs ordered.  -     Thyroid Panel With TSH; Future  Atrial flutter, unspecified type [facility]) Doing well on current therapy. Continue to follow with cardiology.  -     CBC with Differential/Platelet; Future     Follow Up Instructions: Return in about 3 months (around 05/05/2019), or if symptoms worsen or fail to improve.    I discussed the assessment and treatment plan with the patient. The patient was provided an opportunity to ask  questions and all were answered. The patient agreed with the plan and demonstrated an understanding of the instructions.   The patient was advised to call back or seek an in-person evaluation if the symptoms worsen or if the condition fails to improve as anticipated.  The above assessment and management plan was discussed with the patient. The patient verbalized understanding of and has agreed to the management plan. Patient is aware to call the clinic if they develop any new symptoms or if symptoms persist or worsen. Patient is aware when to return to the clinic for a follow-up visit. Patient educated on when it is appropriate to go to the emergency department.    I provided 25 minutes of non-face-to-face time during this encounter. The call started at 1335. The call ended at 1400. The other time was used for coordination of care.    Monia Pouch, FNP-C [facility] [address] [place], [place] [address] [phone] 02/04/2019

## 2019-02-04 NOTE — Telephone Encounter (Signed)
Spoke with patient. Advised wound photo and manual LINQ transmission were received. No additional questions at this time.

## 2019-02-07 ENCOUNTER — Telehealth: Payer: Self-pay | Admitting: Family Medicine

## 2019-02-07 NOTE — Telephone Encounter (Signed)
REFERRAL REQUEST Telephone Note  What type of referral do you need?  Mental Health w/[facility] in [facility] bc [facility] isn't taking any New pt's.  Have you been seen at our office for this problem? He talked to provider with televisit (Advise that they will likely need an appointment with their PCP before a referral can be done)  Is there a particular doctor or location that you prefer? [facility] in [place]  Patient notified that referrals can take up to a week or longer to process. If they haven't heard anything within a week they should call back and speak with the referral department.   He has been waiting for over one month. He needs this asap. Rakes' pt.

## 2019-02-09 ENCOUNTER — Other Ambulatory Visit: Payer: Self-pay | Admitting: Family Medicine

## 2019-02-09 DIAGNOSIS — F339 Major depressive disorder, recurrent, unspecified: Secondary | ICD-10-CM

## 2019-02-09 NOTE — Telephone Encounter (Signed)
Done, thanks

## 2019-02-11 ENCOUNTER — Other Ambulatory Visit: Payer: Self-pay | Admitting: Family Medicine

## 2019-02-11 DIAGNOSIS — F1020 Alcohol dependence, uncomplicated: Secondary | ICD-10-CM

## 2019-02-11 DIAGNOSIS — F339 Major depressive disorder, recurrent, unspecified: Secondary | ICD-10-CM

## 2019-02-11 NOTE — Telephone Encounter (Signed)
New referral placed. Thanks

## 2019-02-11 NOTE — Telephone Encounter (Signed)
I called Jonelle Sidle from [place] that for "this" Patient's they would Refer them out to [facility] - I explained to her that this Patient has already seen [facility] and did not wish to go back. She then told me about [facility] in [place]! From the information concerning this patient and a couple other's I have checked - they are not seeing new patient's at either location. If you will place a new ref and place the department as "Outgoing" so it drops into my work que I will talk with patient about his options. Thanks Ma'aM :)

## 2019-02-18 DIAGNOSIS — F329 Major depressive disorder, single episode, unspecified: Secondary | ICD-10-CM | POA: Diagnosis not present

## 2019-02-18 DIAGNOSIS — Z85038 Personal history of other malignant neoplasm of large intestine: Secondary | ICD-10-CM | POA: Diagnosis not present

## 2019-02-18 DIAGNOSIS — R7989 Other specified abnormal findings of blood chemistry: Secondary | ICD-10-CM | POA: Diagnosis not present

## 2019-02-18 DIAGNOSIS — F1011 Alcohol abuse, in remission: Secondary | ICD-10-CM | POA: Diagnosis not present

## 2019-02-18 LAB — HEPATIC FUNCTION PANEL
AG Ratio: 1.2 (calc) (ref 1.0–2.5)
ALT: 130 U/L — ABNORMAL HIGH (ref 9–46)
AST: 149 U/L — ABNORMAL HIGH (ref 10–35)
Albumin: 3.9 g/dL (ref 3.6–5.1)
Alkaline phosphatase (APISO): 103 U/L (ref 35–144)
Bilirubin, Direct: 0.1 mg/dL (ref 0.0–0.2)
Globulin: 3.2 g/dL (calc) (ref 1.9–3.7)
Indirect Bilirubin: 0.3 mg/dL (calc) (ref 0.2–1.2)
Total Bilirubin: 0.4 mg/dL (ref 0.2–1.2)
Total Protein: 7.1 g/dL (ref 6.1–8.1)

## 2019-02-18 LAB — IRON,TIBC AND FERRITIN PANEL
%SAT: 30 % (calc) (ref 20–48)
Ferritin: 1021 ng/mL — ABNORMAL HIGH (ref 38–380)
Iron: 92 ug/dL (ref 50–180)
TIBC: 309 mcg/dL (calc) (ref 250–425)

## 2019-02-18 LAB — CEA: CEA: <0.5 ng/mL

## 2019-02-22 ENCOUNTER — Encounter (INDEPENDENT_AMBULATORY_CARE_PROVIDER_SITE_OTHER): Payer: Self-pay

## 2019-02-22 ENCOUNTER — Telehealth: Payer: Self-pay

## 2019-02-22 NOTE — Telephone Encounter (Signed)
Device alert received for Tachy episode 02/11/19 0657 lasting about 4 minutes.  Need remote transmission for full details.  Attempted to reach pt by phone, no answer/ no VM.  Sending my chart message to pt requesting he send manual transmission/ call office.

## 2019-02-24 ENCOUNTER — Telehealth: Payer: Self-pay

## 2019-02-24 DIAGNOSIS — F329 Major depressive disorder, single episode, unspecified: Secondary | ICD-10-CM | POA: Diagnosis not present

## 2019-02-24 NOTE — Telephone Encounter (Signed)
Attempted to reach patient again, no answer. Will route to Dr Rayann Heman for review.   Chanetta Marshall, NP 02/24/2019 8:57 AM

## 2019-02-24 NOTE — Telephone Encounter (Signed)
I let the pt know we need a manual transmission from his home remote monitor. I gave him verbal instructions. I told him when we receive the transmission the nurse will review it and give him a call back. The pt states he was not feeling we and did not get much sleep. He would like for the nurse to call him back tomorrow. I also gave him [employer] tech support number in case he receive any error codes.

## 2019-02-25 NOTE — Telephone Encounter (Signed)
Continue to monitor

## 2019-02-25 NOTE — Telephone Encounter (Signed)
Spoke with patient. Advised manual transmission was successful. Pt denies any symptoms with tachy episode from 02/11/19 at 06:58, reports he would've still been asleep at that time. ECG appears SVT, median V rate 176bpm. Pt reports compliance with diltiazem 240mg  daily, denies any missed doses, also reports compliance with other cardiac meds. Pt reports that since his LINQ was implanted, he has not had any symptomatic episodes, feels he is doing well. Advised will continue to monitor remotely via [employer]. Pt in agreement with plan and aware to call for symptomatic episodes.  Routed to [name] for review of episode ECG.

## 2019-02-27 LAB — CUP PACEART REMOTE DEVICE CHECK
Date Time Interrogation Session: 20210130103716
Implantable Pulse Generator Implant Date: 20201228

## 2019-02-28 ENCOUNTER — Ambulatory Visit (INDEPENDENT_AMBULATORY_CARE_PROVIDER_SITE_OTHER): Payer: Medicaid Other | Admitting: *Deleted

## 2019-02-28 DIAGNOSIS — R002 Palpitations: Secondary | ICD-10-CM | POA: Diagnosis not present

## 2019-02-28 NOTE — Progress Notes (Signed)
ILR Remote 

## 2019-03-01 ENCOUNTER — Other Ambulatory Visit: Payer: Self-pay

## 2019-03-02 ENCOUNTER — Ambulatory Visit: Payer: Medicaid Other | Admitting: Family Medicine

## 2019-03-02 ENCOUNTER — Encounter: Payer: Self-pay | Admitting: Family Medicine

## 2019-03-02 DIAGNOSIS — Z5329 Procedure and treatment not carried out because of patient's decision for other reasons: Secondary | ICD-10-CM

## 2019-03-02 DIAGNOSIS — I10 Essential (primary) hypertension: Secondary | ICD-10-CM

## 2019-03-02 DIAGNOSIS — I484 Atypical atrial flutter: Secondary | ICD-10-CM

## 2019-03-02 DIAGNOSIS — Z91199 Patient's noncompliance with other medical treatment and regimen due to unspecified reason: Secondary | ICD-10-CM | POA: Insufficient documentation

## 2019-03-02 DIAGNOSIS — Z7689 Persons encountering health services in other specified circumstances: Secondary | ICD-10-CM | POA: Diagnosis not present

## 2019-03-02 DIAGNOSIS — E538 Deficiency of other specified B group vitamins: Secondary | ICD-10-CM

## 2019-03-02 DIAGNOSIS — R5383 Other fatigue: Secondary | ICD-10-CM | POA: Diagnosis not present

## 2019-03-02 DIAGNOSIS — F339 Major depressive disorder, recurrent, unspecified: Secondary | ICD-10-CM

## 2019-03-02 DIAGNOSIS — F1020 Alcohol dependence, uncomplicated: Secondary | ICD-10-CM

## 2019-03-02 DIAGNOSIS — E039 Hypothyroidism, unspecified: Secondary | ICD-10-CM

## 2019-03-02 DIAGNOSIS — I4892 Unspecified atrial flutter: Secondary | ICD-10-CM | POA: Diagnosis not present

## 2019-03-02 DIAGNOSIS — E782 Mixed hyperlipidemia: Secondary | ICD-10-CM | POA: Diagnosis not present

## 2019-03-02 MED ORDER — LEVOTHYROXINE SODIUM 25 MCG PO TABS: 25.0000 ug | ORAL_TABLET | Freq: Every day | ORAL | 5 refills | Status: DC

## 2019-03-02 NOTE — Patient Instructions (Signed)
If your symptoms worsen or you have thoughts of suicide/homicide, PLEASE SEEK IMMEDIATE MEDICAL ATTENTION.  You may always call the National Suicide Hotline.  This is available 24 hours a day, 7 days a week.  Their number is: 1-800-273-8255  Taking the medicine as directed and not missing any doses is one of the best things you can do to treat your depression.  Here are some things to keep in mind:  1) Side effects (stomach upset, some increased anxiety) may happen before you notice a benefit.  These side effects typically go away over time. 2) Changes to your dose of medicine or a change in medication all together is sometimes necessary 3) Most people need to be on medication at least 12 months 4) Many people will notice an improvement within two weeks but the full effect of the medication can take up to 4-6 weeks 5) Stopping the medication when you start feeling better often results in a return of symptoms 6) Never discontinue your medication without contacting a health care professional first.  Some medications require gradual discontinuation/ taper and can make you sick if you stop them abruptly.  If your symptoms worsen or you have thoughts of suicide/homicide, PLEASE SEEK IMMEDIATE MEDICAL ATTENTION.  You may always call:  National Suicide Hotline: 800-273-8255 [place] Crisis Line: 336-832-9700 Crisis Recovery in [place]: 800-939-5911   These are available 24 hours a day, 7 days a week.   

## 2019-03-02 NOTE — Progress Notes (Signed)
Subjective:  Patient ID: Earl [name], male    DOB: Nov 06, 1960, 59 y.o.   MRN: [id number]  Patient Care Team: Baruch Gouty, FNP as PCP - General (Family Medicine) Herminio Commons, MD as PCP - Cardiology (Cardiology) [name], MD as PCP - Electrophysiology (Cardiology)   Chief Complaint:  Medical Management of Chronic Issues (4 week - depression )   HPI: Earl [name] is a 59 y.o. male presenting on 03/02/2019 for Medical Management of Chronic Issues (4 week - depression )   1. Morbid obesity ([name]) Does not watch diet and does not exercise. Does not report any weight loss or gain.   2. Acquired hypothyroidism Compliant with medications - Yes Current medications - synthroid 25 mcg Adverse side effects - No Weight - fluctuating a bit  Bowel habit changes - No Heat or cold intolerance - No Mood changes - Yes Changes in sleep habits - Yes Fatigue - Yes Skin, hair, or nail changes - No Tremor - No Palpitations - No Edema - No Shortness of breath - No Lab Results  Component Value Date   TSH 7.260 (H) 10/13/2018    3. Mixed hyperlipidemia Not on statin therapy and does not wish to be. Does take red yeast rice daily. Does not watch diet and does not exercise on a regular basis.   4. Essential hypertension Compliant with medications and does follow up with cardiology on a regular basis. Does not watch diet and does not exercise on a regular basis. No headache, chest pain, shortness of breath, dizziness, or syncope. Does have some lower extremity swelling at times.   5. Atypical atrial flutter (HCC) Recent loop recorder without significant findings. Pt states he feels once the monitor was placed the palpitations stopped and have not returned. He has an upcoming appointment with cardiology.   6. Vitamin B 12 deficiency Has been on IM repletion therapy in the past. Has not received an IM injection since 09/22/2018. Will recheck levels today to see if repletion  therapy is still warranted as pt is still abusing alcohol.  7. ETOHism ([name]) 8. Depression, recurrent ([name]) Have referred pt to psychiatry on multiple occasions for management of anxiety and depression. Pt voices he wants a competent provider who will let him dictate what medications he takes as he knows what will work for him. States he did not receive competent care at [facility] and the provider told pt "to see PCP for management of his mental health because he was not comfortable with the medications he was requesting". Records from [facility] have not been received. Referrals to psychiatry in [place] made several times. Pt was given an appointment in March and declined this.  Pt states he continues to have anxiety and depression and needs something for this.  Depression screen [facility] 2/9 03/02/2019 02/04/2019 01/05/2019 11/03/2018 10/13/2018  Decreased Interest _0 0 0  Down, Depressed, Hopeless _1 0 1  PHQ - 2 Score _2 0 1  Altered sleeping _3 - -  Tired, decreased energy _4 - 1  Change in appetite _5 - 0  Feeling bad or failure about yourself  0 2 2 - 0  Trouble concentrating 1 - 2 - 0  Moving slowly or fidgety/restless 0 1 2 - 0  Suicidal thoughts 0 0 0 - 0  PHQ-9 Score _6 - -  Difficult doing work/chores Somewhat difficult - - - -  Some recent data might be hidden   GAD 7 : Generalized Anxiety Score 03/02/2019 09/14/2015  Nervous, Anxious, on Edge 2 1  Control/stop worrying 2 3  Worry too much - different things 2 3  Trouble relaxing 2 3  Restless 1 2  Easily annoyed or irritable 2 3  Afraid - awful might happen 1 3  Total GAD 7 Score 12 18  Anxiety Difficulty Somewhat difficult Very difficult       Relevant past medical, surgical, family, and social history reviewed and updated as indicated.  Allergies and medications reviewed and updated. Date reviewed: Chart in Epic.   Past Medical History:  Diagnosis Date  . Alcohol abuse   . Colon cancer (HCC)   .  Depression   . Gall stones   . Hypertension   . Kidney stone   . Typical atrial flutter (HCC)     Past Surgical History:  Procedure Laterality Date  . A-FLUTTER ABLATION N/A 07/31/2016   Procedure: A-Flutter Ablation;  Surgeon: Allred, James, MD;  Location: [facility];  Service: Cardiovascular;  Laterality: N/A;  . COLONOSCOPY N/A 04/17/2016   Procedure: COLONOSCOPY;  Surgeon: Najeeb U Rehman, MD;  Location: [facility] ENDO SUITE;  Service: Endoscopy;  Laterality: N/A;  1200  . COLONOSCOPY N/A 05/07/2017   Procedure: COLONOSCOPY;  Surgeon: Rehman, Najeeb U, MD;  Location: [facility] ENDO [facility];  Service: Endoscopy;  Laterality: N/A;  200  . implantable loop recorder placement  01/24/2019    Medtronic Reveal Linq model LNQ 11 implantable loop recorder (SN [id number] ) by Dr Allred for evaluation of palpitations  . MINOR CARPAL TUNNEL     right  . POLYPECTOMY  04/17/2016   Procedure: POLYPECTOMY;  Surgeon: Najeeb U Rehman, MD;  Location: [facility];  Service: Endoscopy;;  sigmoid    Social History   Socioeconomic History  . Marital status: Single    Spouse name: Not on file  . Number of children: Not on file  . Years of education: Not on file  . Highest education level: Not on file  Occupational History  . Not on file  Tobacco Use  . Smoking status: Former Smoker    Quit date: 03/03/1979    Years since quitting: 40.0  . Smokeless tobacco: Never Used  Substance and Sexual Activity  . Alcohol use: Yes    Alcohol/week: 8.0 standard drinks    Types: 8 Cans of beer per week    Comment: drinks heavily at times  . Drug use: No  . Sexual activity: Not on file  Other Topics Concern  . Not on file  Social History Narrative   Disabled   Lives in [place]   Social Determinants of Health   Financial Resource Strain:   . Difficulty of Paying Living Expenses: Not on file  Food Insecurity:   . Worried About Running Out of Food in the Last Year: Not on file  . Ran Out of Food in the Last Year:  Not on file  Transportation Needs:   . Lack of Transportation (Medical): Not on file  . Lack of Transportation (Non-Medical): Not on file  Physical Activity:   . Days of Exercise per Week: Not on file  . Minutes of Exercise per Session: Not on file  Stress:   . Feeling of Stress : Not on file  Social Connections:   . Frequency of Communication with Friends and Family: Not on file  . Frequency of Social Gatherings with Friends and Family: Not   on file  . Attends Religious Services: Not on file  . Active Member of Clubs or Organizations: Not on file  . Attends Archivist Meetings: Not on file  . Marital Status: Not on file  Intimate Partner Violence:   . Fear of Current or Ex-Partner: Not on file  . Emotionally Abused: Not on file  . Physically Abused: Not on file  . Sexually Abused: Not on file    Outpatient Encounter Medications as of 03/02/2019  Medication Sig  . ALPRAZolam (XANAX) 1 MG tablet Take 1 mg by mouth 2 (two) times daily as needed for anxiety.   . chlorthalidone (HYGROTON) 25 MG tablet Take 1 tablet (25 mg total) by mouth daily.  . Cholecalciferol (VITAMIN D3) 1.25 MG (50000 UT) CAPS Take 1 capsule by mouth once a week.  . diltiazem (CARDIZEM CD) 240 MG 24 hr capsule Take 1 capsule (240 mg total) by mouth daily.  Marland Kitchen ketoconazole (NIZORAL) 2 % shampoo Apply 1 application topically as needed.   Marland Kitchen levothyroxine (SYNTHROID) 25 MCG tablet Take 1 tablet (25 mcg total) by mouth daily before breakfast.  . lidocaine (XYLOCAINE) 5 % ointment Apply 1 application topically 3 (three) times daily as needed.  Marland Kitchen lisinopril (ZESTRIL) 40 MG tablet Take 40 mg by mouth daily.  Marland Kitchen LYRICA 200 MG capsule Take 200 mg by mouth 2 (two) times daily.  . Magnesium Hydroxide (MAGNESIA PO) Take 1 tablet by mouth daily.  . Omega-3 Fatty Acids (OMEGA 3 PO) Take by mouth daily.  . Potassium (POTASSIMIN PO) Take 3 tablets by mouth daily.  . Red Yeast Rice Extract (RED YEAST RICE PO) Take by mouth.   . zinc gluconate 50 MG tablet Take 50 mg by mouth every 30 (thirty) days. Take with vitamin d  . [DISCONTINUED] levothyroxine (SYNTHROID) 25 MCG tablet TAKE 1 TABLET BY MOUTH ONCE DAILY BEFORE BREAKFAST  . [DISCONTINUED] amLODipine (NORVASC) 5 MG tablet Take 5 mg by mouth daily.  . [DISCONTINUED] gabapentin (NEURONTIN) 300 MG capsule Take 600 mg by mouth 3 (three) times daily.  . [DISCONTINUED] oxyCODONE-acetaminophen (PERCOCET/ROXICET) 5-325 MG tablet Take 1 tablet by mouth every 6 (six) hours as needed for severe pain.   Facility-Administered Encounter Medications as of 03/02/2019  Medication  . cyanocobalamin ((VITAMIN B-12)) injection 1,000 mcg    Allergies  Allergen Reactions  . Amitriptyline Anaphylaxis  . Amoxicillin Anaphylaxis and Rash    Has patient had a PCN reaction causing immediate rash, facial/tongue/throat swelling, SOB or lightheadedness with hypotension: Yes Has patient had a PCN reaction causing severe rash involving mucus membranes or skin necrosis: No Has patient had a PCN reaction that required hospitalization: No Has patient had a PCN reaction occurring within the last 10 years: Yes If all of the above answers are "NO", then may proceed with Cephalosporin use.   Diona Fanti [Aspirin] Anaphylaxis  . Clonidine Derivatives Other (See Comments)    Severe Dry Mouth  . Hydrochlorothiazide     Caused rectal bleeding and hematuria  . Lisinopril     Adverse reaction--causes cough    Review of Systems  Constitutional: Positive for activity change, appetite change and fatigue. Negative for chills, diaphoresis, fever and unexpected weight change.  HENT: Negative.   Eyes: Negative.  Negative for photophobia and visual disturbance.  Respiratory: Negative for cough, chest tightness and shortness of breath.   Cardiovascular: Positive for palpitations. Negative for chest pain and leg swelling.  Gastrointestinal: Negative for abdominal pain, blood in stool, constipation, diarrhea,  nausea and vomiting.  Endocrine: Negative.  Negative for cold intolerance, heat intolerance, polydipsia, polyphagia and polyuria.  Genitourinary: Negative for decreased urine volume, difficulty urinating, dysuria, frequency and urgency.  Musculoskeletal: Negative for arthralgias and myalgias.  Skin: Negative.   Allergic/Immunologic: Negative.   Neurological: Negative for dizziness, tremors, seizures, syncope, facial asymmetry, speech difficulty, weakness, light-headedness, numbness and headaches.  Hematological: Negative.   Psychiatric/Behavioral: Positive for agitation, behavioral problems, decreased concentration, dysphoric mood and sleep disturbance. Negative for confusion, hallucinations, self-injury and suicidal ideas. The patient is nervous/anxious. The patient is not hyperactive.   All other systems reviewed and are negative.       Objective:  BP 109/60   Pulse 87   Temp 99.3 F (37.4 C)   Resp 20   Ht 5' 10" (1.778 m)   Wt 273 lb (123.8 kg)   SpO2 96%   BMI 39.17 kg/m    Wt Readings from Last 3 Encounters:  03/02/19 273 lb (123.8 kg)  01/24/19 272 lb 9.6 oz (123.7 kg)  12/22/18 260 lb (117.9 kg)    Physical Exam Vitals and nursing note reviewed.  Constitutional:      General: He is not in acute distress.    Appearance: Normal appearance. He is well-developed and well-groomed. He is morbidly obese. He is not ill-appearing, toxic-appearing or diaphoretic.     Comments: Poor hygiene, unkept.  HENT:     Head: Normocephalic and atraumatic.     Jaw: There is normal jaw occlusion.     Right Ear: Hearing normal.     Left Ear: Hearing normal.     Nose: Nose normal.     Mouth/Throat:     Lips: Pink.     Mouth: Mucous membranes are moist.     Pharynx: Oropharynx is clear. Uvula midline.  Eyes:     General: Lids are normal.     Extraocular Movements: Extraocular movements intact.     Conjunctiva/sclera: Conjunctivae normal.     Pupils: Pupils are equal, round, and  reactive to light.  Neck:     Thyroid: No thyroid mass, thyromegaly or thyroid tenderness.     Vascular: No carotid bruit or JVD.     Trachea: Trachea and phonation normal.  Cardiovascular:     Rate and Rhythm: Normal rate and regular rhythm.     Chest Wall: PMI is not displaced.     Pulses: Normal pulses.     Heart sounds: Normal heart sounds. No murmur. No friction rub. No gallop.   Pulmonary:     Effort: Pulmonary effort is normal. No respiratory distress.     Breath sounds: Normal breath sounds. No wheezing.  Abdominal:     General: Bowel sounds are normal. There is no distension or abdominal bruit.     Palpations: Abdomen is soft. There is no hepatomegaly or splenomegaly.     Tenderness: There is no abdominal tenderness. There is no right CVA tenderness or left CVA tenderness.     Hernia: No hernia is present.  Musculoskeletal:        General: Normal range of motion.     Cervical back: Normal range of motion and neck supple.     Right lower leg: No edema.     Left lower leg: No edema.  Lymphadenopathy:     Cervical: No cervical adenopathy.  Skin:    General: Skin is warm and dry.     Capillary Refill: Capillary refill takes less than 2 seconds.       Coloration: Skin is not cyanotic, jaundiced or pale.     Findings: No rash.  Neurological:     General: No focal deficit present.     Mental Status: He is alert and oriented to person, place, and time.     Cranial Nerves: Cranial nerves are intact. No cranial nerve deficit.     Sensory: Sensation is intact. No sensory deficit.     Motor: Motor function is intact. No weakness.     Coordination: Coordination is intact. Coordination normal.     Gait: Gait is intact. Gait normal.     Deep Tendon Reflexes: Reflexes are normal and symmetric. Reflexes normal.  Psychiatric:        Attention and Perception: Attention and perception normal.        Mood and Affect: Mood and affect normal.        Speech: Speech normal.        Behavior:  Behavior normal. Behavior is cooperative.        Thought Content: Thought content normal.        Cognition and Memory: Cognition and memory normal.        Judgment: Judgment normal.     Comments: Poor eye contact, answers to questions inconsistent. Unkept.     Results for orders placed or performed in visit on 02/28/19  CUP PACEART REMOTE DEVICE CHECK  Result Value Ref Range   Date Time Interrogation Session [id number]    Pulse Generator Manufacturer MERM    Pulse Gen Model KMM38 Reveal LINQ    Pulse Gen Serial Number [id number]    Clinic Name [facility] Pulse Generator Type ICM/ILR    Implantable Pulse Generator Implant Date [id number]        Pertinent labs & imaging results that were available during my care of the patient were reviewed by me and considered in my medical decision making.  Assessment & Plan:  Earl [name] was seen today for medical management of chronic issues.  Diagnoses and all orders for this visit:  Morbid obesity ([place]) Diet and exercise encouraged. Labs pending.  -     Thyroid Panel With TSH -     Lipid panel -     CMP14+EGFR -     CBC with Differential/Platelet  Acquired hypothyroidism Thyroid disease has been well controlled. Labs are pending. Adjustments to regimen will be made if warranted. Make sure to take medications on an empty stomach with a full [place] of water. Make sure to avoid vitamins or supplements for at least 4 hours before and 4 hours after taking medications. Repeat labs in 3 months if adjustments are made and in 6 months if stable.   -     Thyroid Panel With TSH -     levothyroxine (SYNTHROID) 25 MCG tablet; Take 1 tablet (25 mcg total) by mouth daily before breakfast.  Mixed hyperlipidemia Diet encouraged - increase intake of fresh fruits and vegetables, increase intake of lean proteins. Bake, broil, or grill foods. Avoid fried, greasy, and fatty foods. Avoid fast foods. Increase intake of fiber-rich whole grains.  Exercise encouraged - at least 150 minutes per week and advance as tolerated.  Goal BMI < 25. Continue medications as prescribed. Follow up in 3-6 months as discussed.  -     Lipid panel  Essential hypertension BP well controlled. Changes were not made in regimen today. Goal BP is 130/80. Pt aware to report any persistent high or low readings. DASH diet and exercise encouraged.  Exercise at least 150 minutes per week and increase as tolerated. Goal BMI > 25. Stress management encouraged. Avoid nicotine and tobacco product use. Avoid excessive alcohol and NSAID's. Avoid more than 2000 mg of sodium daily. Medications as prescribed. Follow up as scheduled.  -     Thyroid Panel With TSH -     CMP14+EGFR -     CBC with Differential/Platelet  Atypical atrial flutter (HCC) Followed by cardiology. Recent loop recorder unremarkable. Will follow up with cardiology as scheduled.  -     CBC with Differential/Platelet  Vitamin B 12 deficiency Alcoholism. Was on repletion therapy in the past. Will check levels today to see if repletion therapy needs to be reinitiated.  -     Vitamin B12 -     Folate  ETOHism (HCC) Depression, recurrent (HCC) Noncompliance by refusing service Pt has had multiple referrals to psychiatry with multiple documented attempts to schedule an appointment. Pt stated the doctor at [facility] was incompetent and told him to go back to PCP for management. Long discussion today about the importance of following up with psychiatry as discussed for management of his mental health conditions. Pt aware he will not be started on any new medications or controlled substances at this office due to his long history of alcohol abuse, substance abuse, and difficulty with management of symptoms. Aware he needs a specialist in this field to manage this. New referral placed today. Encouraged alcohol cessation treatment.  -     CMP14+EGFR -     Ambulatory referral to Psychiatry -     Folate  Total time  spent with patient 45 mintues.  Greater than 50% of encounter spent in coordination of care/counseling.  Continue all other maintenance medications.  Follow up plan: Return in about 3 months (around 05/30/2019), or if symptoms worsen or fail to improve.  Continue healthy lifestyle choices, including diet (rich in fruits, vegetables, and lean proteins, and low in salt and simple carbohydrates) and exercise (at least 30 minutes of moderate physical activity daily).  Educational handout given for depression  The above assessment and management plan was discussed with the patient. The patient verbalized understanding of and has agreed to the management plan. Patient is aware to call the clinic if they develop any new symptoms or if symptoms persist or worsen. Patient is aware when to return to the clinic for a follow-up visit. Patient educated on when it is appropriate to go to the emergency department.   Michelle Rakes, FNP-C [facility] [phone]   

## 2019-03-03 ENCOUNTER — Ambulatory Visit (INDEPENDENT_AMBULATORY_CARE_PROVIDER_SITE_OTHER): Payer: Medicaid Other | Admitting: Internal Medicine

## 2019-03-03 LAB — CBC WITH DIFFERENTIAL/PLATELET
Basophils Absolute: 0 10*3/uL (ref 0.0–0.2)
Basos: 1 %
EOS (ABSOLUTE): 0.2 10*3/uL (ref 0.0–0.4)
Eos: 2 %
Hematocrit: 44.3 % (ref 37.5–51.0)
Hemoglobin: 15.2 g/dL (ref 13.0–17.7)
Immature Grans (Abs): 0 10*3/uL (ref 0.0–0.1)
Immature Granulocytes: 1 %
Lymphocytes Absolute: 1.7 10*3/uL (ref 0.7–3.1)
Lymphs: 22 %
MCH: 33.8 pg — ABNORMAL HIGH (ref 26.6–33.0)
MCHC: 34.3 g/dL (ref 31.5–35.7)
MCV: 98 fL — ABNORMAL HIGH (ref 79–97)
Monocytes Absolute: 0.7 10*3/uL (ref 0.1–0.9)
Monocytes: 9 %
Neutrophils Absolute: 5 10*3/uL (ref 1.4–7.0)
Neutrophils: 65 %
Platelets: 197 10*3/uL (ref 150–450)
RBC: 4.5 x10E6/uL (ref 4.14–5.80)
RDW: 12.1 % (ref 11.6–15.4)
WBC: 7.7 10*3/uL (ref 3.4–10.8)

## 2019-03-03 LAB — CMP14+EGFR
ALT: 106 IU/L — ABNORMAL HIGH (ref 0–44)
AST: 178 IU/L — ABNORMAL HIGH (ref 0–40)
Albumin/Globulin Ratio: 1.3 (ref 1.2–2.2)
Albumin: 3.8 g/dL (ref 3.8–4.9)
Alkaline Phosphatase: 140 IU/L — ABNORMAL HIGH (ref 39–117)
BUN/Creatinine Ratio: 10 (ref 9–20)
BUN: 13 mg/dL (ref 6–24)
Bilirubin Total: 0.6 mg/dL (ref 0.0–1.2)
CO2: 18 mmol/L — ABNORMAL LOW (ref 20–29)
Calcium: 8.4 mg/dL — ABNORMAL LOW (ref 8.7–10.2)
Chloride: 102 mmol/L (ref 96–106)
Creatinine, Ser: 1.28 mg/dL — ABNORMAL HIGH (ref 0.76–1.27)
GFR calc Af Amer: 70 mL/min/{1.73_m2} (ref >59)
GFR calc non Af Amer: 61 mL/min/{1.73_m2} (ref >59)
Globulin, Total: 3 g/dL (ref 1.5–4.5)
Glucose: 118 mg/dL — ABNORMAL HIGH (ref 65–99)
Potassium: 3.6 mmol/L (ref 3.5–5.2)
Sodium: 139 mmol/L (ref 134–144)
Total Protein: 6.8 g/dL (ref 6.0–8.5)

## 2019-03-03 LAB — VITAMIN B12: Vitamin B-12: 747 pg/mL (ref 232–1245)

## 2019-03-03 LAB — LIPID PANEL
Chol/HDL Ratio: 7.8 ratio — ABNORMAL HIGH (ref 0.0–5.0)
Cholesterol, Total: 211 mg/dL — ABNORMAL HIGH (ref 100–199)
HDL: 27 mg/dL — ABNORMAL LOW (ref >39)
LDL Chol Calc (NIH): 152 mg/dL — ABNORMAL HIGH (ref 0–99)
Triglycerides: 174 mg/dL — ABNORMAL HIGH (ref 0–149)
VLDL Cholesterol Cal: 32 mg/dL (ref 5–40)

## 2019-03-03 LAB — THYROID PANEL WITH TSH
Free Thyroxine Index: 2.4 (ref 1.2–4.9)
T3 Uptake Ratio: 23 % — ABNORMAL LOW (ref 24–39)
T4, Total: 10.5 ug/dL (ref 4.5–12.0)
TSH: 8.5 u[IU]/mL — ABNORMAL HIGH (ref 0.450–4.500)

## 2019-03-03 LAB — FOLATE: Folate: 4.6 ng/mL (ref >3.0)

## 2019-03-04 ENCOUNTER — Encounter: Payer: Medicaid Other | Admitting: Internal Medicine

## 2019-03-04 ENCOUNTER — Telehealth: Payer: Self-pay | Admitting: Family Medicine

## 2019-03-04 NOTE — Telephone Encounter (Signed)
Left message to please call our office. 

## 2019-03-07 NOTE — Telephone Encounter (Signed)
Per [name], no recommended changes at this time as patient was asymptomatic with SVT episode.

## 2019-03-09 ENCOUNTER — Other Ambulatory Visit: Payer: Self-pay | Admitting: *Deleted

## 2019-03-09 MED ORDER — LEVOTHYROXINE SODIUM 50 MCG PO TABS: 50.0000 ug | ORAL_TABLET | Freq: Every day | ORAL | 0 refills | Status: DC

## 2019-03-09 NOTE — Telephone Encounter (Signed)
Per 03/02/19 labwork, sending Levothyroxine 50 mcg to pharmacy

## 2019-03-13 ENCOUNTER — Other Ambulatory Visit: Payer: Self-pay | Admitting: Family Medicine

## 2019-03-13 DIAGNOSIS — I10 Essential (primary) hypertension: Secondary | ICD-10-CM

## 2019-03-22 ENCOUNTER — Other Ambulatory Visit (INDEPENDENT_AMBULATORY_CARE_PROVIDER_SITE_OTHER): Payer: Self-pay | Admitting: Internal Medicine

## 2019-03-31 ENCOUNTER — Encounter (INDEPENDENT_AMBULATORY_CARE_PROVIDER_SITE_OTHER): Payer: Self-pay | Admitting: Internal Medicine

## 2019-03-31 ENCOUNTER — Ambulatory Visit (INDEPENDENT_AMBULATORY_CARE_PROVIDER_SITE_OTHER): Payer: Medicaid Other | Admitting: Internal Medicine

## 2019-03-31 ENCOUNTER — Other Ambulatory Visit: Payer: Self-pay

## 2019-03-31 VITALS — BP 137/87 | HR 83 | Temp 97.0°F | Ht 70.0 in | Wt 266.2 lb

## 2019-03-31 DIAGNOSIS — R1010 Upper abdominal pain, unspecified: Secondary | ICD-10-CM

## 2019-03-31 DIAGNOSIS — R110 Nausea: Secondary | ICD-10-CM

## 2019-03-31 DIAGNOSIS — R945 Abnormal results of liver function studies: Secondary | ICD-10-CM

## 2019-03-31 DIAGNOSIS — R7989 Other specified abnormal findings of blood chemistry: Secondary | ICD-10-CM

## 2019-03-31 DIAGNOSIS — Z7689 Persons encountering health services in other specified circumstances: Secondary | ICD-10-CM | POA: Diagnosis not present

## 2019-03-31 LAB — HEPATIC FUNCTION PANEL
AG Ratio: 1.2 (calc) (ref 1.0–2.5)
ALT: 91 U/L — ABNORMAL HIGH (ref 9–46)
AST: 146 U/L — ABNORMAL HIGH (ref 10–35)
Albumin: 3.7 g/dL (ref 3.6–5.1)
Alkaline phosphatase (APISO): 115 U/L (ref 35–144)
Bilirubin, Direct: 0.2 mg/dL (ref 0.0–0.2)
Globulin: 3 g/dL (calc) (ref 1.9–3.7)
Indirect Bilirubin: 0.6 mg/dL (calc) (ref 0.2–1.2)
Total Bilirubin: 0.8 mg/dL (ref 0.2–1.2)
Total Protein: 6.7 g/dL (ref 6.1–8.1)

## 2019-03-31 LAB — FERRITIN: Ferritin: 566 ng/mL — ABNORMAL HIGH (ref 24–380)

## 2019-03-31 LAB — BASIC METABOLIC PANEL
BUN: 10 mg/dL (ref 7–25)
CO2: 28 mmol/L (ref 20–32)
Calcium: 9.1 mg/dL (ref 8.6–10.3)
Chloride: 104 mmol/L (ref 98–110)
Creat: 1.07 mg/dL (ref 0.70–1.25)
Glucose, Bld: 125 mg/dL (ref 65–139)
Potassium: 3.4 mmol/L — ABNORMAL LOW (ref 3.5–5.3)
Sodium: 140 mmol/L (ref 135–146)

## 2019-03-31 MED ORDER — ONDANSETRON HCL 4 MG PO TABS: 4.0000 mg | ORAL_TABLET | Freq: Two times a day (BID) | ORAL | 1 refills | Status: DC | PRN

## 2019-03-31 MED ORDER — PANTOPRAZOLE SODIUM 40 MG PO TBEC: 40.0000 mg | DELAYED_RELEASE_TABLET | Freq: Every day | ORAL | 2 refills | Status: DC

## 2019-03-31 NOTE — Patient Instructions (Signed)
Physician will call with results of blood tests and CT scan when completed. 

## 2019-03-31 NOTE — Progress Notes (Signed)
Presenting complaint;  Nausea abdominal pain and abnormal LFTs.  Database and subjective:  Patient is 59 year old Caucasian male who was last seen on 07/29/2018 for elevated transaminases and elevated serum ferritin.  Patient indicated the time that he was not drinking alcohol and he has not had any several months.  Based on imaging studies I felt that he had nonalcoholic fatty liver disease.  Serum ferritin was also elevated.  He had repeat lab studies on 02/18/2019 and his AST was 149 and ALT was 130.  Serum ferritin was elevated at 1020.  6 months earlier it has been 801.  Therefore follow-up visit was arranged.  Since then he has seen his primary care physician is with diuretics NP and had blood work repeated on 03/02/2019 and is AST has gone up to 178 and ALT was 106.  Patient states that he was doing well at the time of his last visit.  He notices that a month later in August 2020 because of advanced liver disease related to alcohol use.  He began to drink alcohol.  He would drink beer with high alcohol content of 14% and he would drink to 24 ounce cans daily.  He notices urine to be dark and he began to have intense nausea left upper quadrant abdominal pain and he also noticed some pain on the right side.  At times he would push on the left side and he with pain on the right side.  When he notices urine to be very dark he got concerned and decided to stop drinking alcohol.  He said he has not had any in the last 1 month.  He is feeling better.  He is still having intermittent pain in left upper quadrant and mild discomfort on the right rib cage.  Nausea is better but not gone away.  He has not had any vomiting heartburn melena or rectal bleeding.  He also has cut back on his calorie intake.  He says he has good appetite.  He states he has lost close to 10 pounds.  He tells me his weight was well-developed and 70 pounds when he change his eating habits.  He is not taking OTC NSAIDs.  He wonders if his  potassium is low.  He says he has been to the emergency room on few occasions and on every visit his potassium was low.  He is on oral KCl prep. Patient reports history of cholelithiasis.  He says he was diagnosed when he had ultrasound.  [facility] family did [place].  He decided to change his eating habits and did not have relapse of his symptoms.  He had ultrasound in June last year and the study was negative for cholelithiasis. His TSH was high 1 month ago and Synthroid dose has been increased.  He is to have follow-up testing as recommended by Ms. Vernelle Emerald FNP.   Current Medications: Outpatient Encounter Medications as of 03/31/2019  Medication Sig  . ALPRAZolam (XANAX) 1 MG tablet Take 1 mg by mouth 2 (two) times daily as needed for anxiety.   . chlorthalidone (HYGROTON) 25 MG tablet Take 1 tablet by mouth once daily  . Cholecalciferol (VITAMIN D3) 1.25 MG (50000 UT) CAPS Take 1 capsule by mouth once a week.  . diltiazem (CARDIZEM CD) 240 MG 24 hr capsule Take 1 capsule (240 mg total) by mouth daily.  Marland Kitchen ketoconazole (NIZORAL) 2 % shampoo Apply 1 application topically as needed.   Marland Kitchen levothyroxine (SYNTHROID) 50 MCG tablet Take 1  tablet (50 mcg total) by mouth daily.  Marland Kitchen lisinopril (ZESTRIL) 40 MG tablet Take 40 mg by mouth daily.  Marland Kitchen LYRICA 200 MG capsule Take 200 mg by mouth 2 (two) times daily.  . Magnesium Hydroxide (MAGNESIA PO) Take 1 tablet by mouth daily.  . Omega-3 Fatty Acids (OMEGA 3 PO) Take by mouth in the morning and at bedtime.   . Potassium (POTASSIMIN PO) Take 2 tablets by mouth daily.   . Red Yeast Rice Extract (RED YEAST RICE PO) Take by mouth in the morning and at bedtime.   Marland Kitchen zinc gluconate 50 MG tablet Take 50 mg by mouth every 30 (thirty) days. Take with vitamin d  . [DISCONTINUED] lidocaine (XYLOCAINE) 5 % ointment Apply 1 application topically 3 (three) times daily as needed. (Patient not taking: Reported on 03/31/2019)   Facility-Administered Encounter  Medications as of 03/31/2019  Medication  . cyanocobalamin ((VITAMIN B-12)) injection 1,000 mcg    Objective: Blood pressure 137/87, pulse 83, temperature (!) 97 F (36.1 C), temperature source Oral, height 5' 10"  (1.778 m), weight 266 lb 3.2 oz (120.7 kg). Patient is alert and in no acute distress. He is wearing facial mask. He appears worried. Conjunctiva is pink. Sclera is nonicteric Oropharyngeal mucosa is normal. No neck masses or thyromegaly noted. Cardiac exam with regular rhythm normal S1 and S2. No murmur or gallop noted. Lungs are clear to auscultation. Abdomen is protuberant.  Bowel sounds are normal.  On palpation abdomen is soft.  No tenderness noted.  Liver edge is easily palpable below right costal margin with prominent left lobe.  It is not tender or firm.  Spleen is nonpalpable. No LE edema or clubbing noted.  Labs/studies Results:  CBC Latest Ref Rng & Units 03/02/2019 10/13/2018 03/06/2017  WBC 3.4 - 10.8 x10E3/uL 7.7 8.1 4.8  Hemoglobin 13.0 - 17.7 g/dL 15.2 16.2 15.8  Hematocrit 37.5 - 51.0 % 44.3 47.9 47.2  Platelets 150 - 450 x10E3/uL 197 266 207    CMP Latest Ref Rng & Units 03/02/2019 02/18/2019 11/03/2018  Glucose 65 - 99 mg/dL 118(H) - 101(H)  BUN 6 - 24 mg/dL 13 - 14  Creatinine 0.76 - 1.27 mg/dL 1.28(H) - 1.04  Sodium 134 - 144 mmol/L 139 - 138  Potassium 3.5 - 5.2 mmol/L 3.6 - 3.5  Chloride 96 - 106 mmol/L 102 - 99  CO2 20 - 29 mmol/L 18(L) - 23  Calcium 8.7 - 10.2 mg/dL 8.4(L) - 10.1  Total Protein 6.0 - 8.5 g/dL 6.8 7.1 6.6  Total Bilirubin 0.0 - 1.2 mg/dL 0.6 0.4 0.4  Alkaline Phos 39 - 117 IU/L 140(H) - 98  AST 0 - 40 IU/L 178(H) 149(H) 140(H)  ALT 0 - 44 IU/L 106(H) 130(H) 127(H)    Hepatic Function Latest Ref Rng & Units 03/02/2019 02/18/2019 11/03/2018  Total Protein 6.0 - 8.5 g/dL 6.8 7.1 6.6  Albumin 3.8 - 4.9 g/dL 3.8 - 3.9  AST 0 - 40 IU/L 178(H) 149(H) 140(H)  ALT 0 - 44 IU/L 106(H) 130(H) 127(H)  Alk Phosphatase 39 - 117 IU/L 140(H) - 98   Total Bilirubin 0.0 - 1.2 mg/dL 0.6 0.4 0.4  Bilirubin, Direct 0.0 - 0.2 mg/dL - 0.1 -    Serum ferritin 1021 on 02/21/2019 Serum ferritin 8011 09/07/2018.  CEA was less than 0.5 on 02/18/2019.  Assessment:  #1.  Upper abdominal pain predominantly left upper quadrant with some moderate costal margin associated with nausea.  This pain started when he began to  drink alcohol which she has stopped drinking.  Remains the symptoms.  Differential diagnosis include gastritis peptic ulcer disease.  Doubt pancreatitis. He will be further evaluated with abdominal CT with contrast if serum creatinine allows.  #2.  Abnormal LFTs.  AST has always been greater than ALT.  Markers for hepatitis B and C have been negative.  Serum ferritin has been elevated.  Last year when I saw him for this problem I felt he had elevated transaminases secondary to fatty liver.  Ultrasound in June 2018 revealed echogenic liver consistent with fatty liver.  AST bump may be related to alcohol ingestion with he is not drinking anymore.  #3.  History of invasive carcinoma and a tubular adenoma which was removed in March 2018.  He did not undergo segmental resection.  He has been followed by serial CEAs have always been normal.  Last surveillance colonoscopy was April 2019 with removal of 2 small polyps and these were tubular adenomas.  Patient is due for surveillance colonoscopy April 2022.  #4.  Obesity.  Patient weighed 215 pounds 3 years ago.  He needs to deal with this issue seriously.  I am concerned that his liver disease will progress to decompensation over the years.    Plan:  Pantoprazole 40 mg by mouth 30 minutes before breakfast daily.. Ondansetron 4 mg p.o. two times daily as needed. Patient will go to the lab for metabolic 7, LFTs and serum ferritin. Abdominal CT with contrast unless serum creatinine is elevated. I will be contacting patient results of the studies and plan to see him back in 3 months.  If abdominal  pain and nausea persists will consider esophagogastroduodenoscopy.

## 2019-04-04 ENCOUNTER — Ambulatory Visit (INDEPENDENT_AMBULATORY_CARE_PROVIDER_SITE_OTHER): Payer: Medicaid Other | Admitting: Nurse Practitioner

## 2019-04-04 ENCOUNTER — Ambulatory Visit (INDEPENDENT_AMBULATORY_CARE_PROVIDER_SITE_OTHER): Payer: Medicaid Other | Admitting: *Deleted

## 2019-04-04 DIAGNOSIS — R002 Palpitations: Secondary | ICD-10-CM

## 2019-04-04 LAB — CUP PACEART REMOTE DEVICE CHECK
Date Time Interrogation Session: 20210308032712
Implantable Pulse Generator Implant Date: 20201228

## 2019-04-05 NOTE — Progress Notes (Signed)
ILR Remote 

## 2019-04-21 ENCOUNTER — Telehealth: Payer: Self-pay | Admitting: *Deleted

## 2019-04-21 ENCOUNTER — Ambulatory Visit (HOSPITAL_COMMUNITY)
Admission: RE | Admit: 2019-04-21 | Discharge: 2019-04-21 | Disposition: A | Discharge status: Home / Self Care | Payer: Medicaid Other | Source: Ambulatory Visit | Attending: Internal Medicine | Admitting: Internal Medicine

## 2019-04-21 ENCOUNTER — Encounter: Payer: Self-pay | Admitting: *Deleted

## 2019-04-21 ENCOUNTER — Other Ambulatory Visit: Payer: Self-pay

## 2019-04-21 DIAGNOSIS — Z7689 Persons encountering health services in other specified circumstances: Secondary | ICD-10-CM | POA: Diagnosis not present

## 2019-04-21 DIAGNOSIS — R1012 Left upper quadrant pain: Secondary | ICD-10-CM | POA: Diagnosis not present

## 2019-04-21 DIAGNOSIS — R1010 Upper abdominal pain, unspecified: Secondary | ICD-10-CM | POA: Insufficient documentation

## 2019-04-21 DIAGNOSIS — R945 Abnormal results of liver function studies: Secondary | ICD-10-CM | POA: Diagnosis not present

## 2019-04-21 MED ORDER — IOHEXOL 300 MG/ML  SOLN
100.0000 mL | Freq: Once | INTRAMUSCULAR | Status: AC | PRN
  Administered 2019-04-21: 100 mL via INTRAVENOUS

## 2019-04-21 NOTE — Telephone Encounter (Signed)
The pt states he will send a manual transmission as soon as he is done eating.

## 2019-04-21 NOTE — Telephone Encounter (Signed)
Reviewed with [name]. As symptoms have resolved, will continue to monitor for now. Need to confirm back in SR per manual transmission. Will route as FYI once transmission received.

## 2019-04-21 NOTE — Progress Notes (Signed)
No show for appointment  appoointment cancelled

## 2019-04-21 NOTE — Telephone Encounter (Addendum)
[facility] alert transmission received for tachy episode on 04/20/19 at 13:03, duration 13min 13sec, median V rate 182bpm. ECG appears SVT Repeat manual transmission received today at 09:17, 5 additional tachy episodes noted today beginning at 08:32. ECGs show SVT. Presenting rhythm SVT 150s. See example ECGs below.  Spoke with patient. Reports he noticed that his HR was elevated in the 150s-170s (per pulse ox) this morning, mild palpitations. Felt "a little winded" but not so much that he had to sit down. Denies chest pain, dizziness, or syncope. Took 2mg  of Xanax and reports symptoms improved 10 min later. He reports compliance with diltiazem CD 240mg  daily and reports he had taken it shortly before onset of tachycardia today. HR now "normal" per his pulse ox. Reports he feels well at this time. Requested manual transmission to confirm back in SR. Pt agrees to send a manual transmission today, but will plan to send after his CT scan this afternoon.   Discussed appropriate use of PRN Xanax, explained that Xanax will not help control his HR. Pt verbalizes understanding. Advised pt for any future symptomatic episodes/manual transmissions, he should call our office to make [place] aware of his symptoms. Explained will review episodes with [name] and call back if any new recommendations. Pt in agreement with plan and denies questions or concerns at this time. Pt aware of virtual visit with [name] on 05/13/19.

## 2019-04-22 NOTE — Telephone Encounter (Signed)
Updated transmission received. Pt back in SR.  [place] to MD for review as noted

## 2019-05-04 ENCOUNTER — Ambulatory Visit: Payer: Medicaid Other | Admitting: Family Medicine

## 2019-05-05 ENCOUNTER — Ambulatory Visit (INDEPENDENT_AMBULATORY_CARE_PROVIDER_SITE_OTHER): Payer: Medicaid Other | Admitting: *Deleted

## 2019-05-05 DIAGNOSIS — R002 Palpitations: Secondary | ICD-10-CM | POA: Diagnosis not present

## 2019-05-05 LAB — CUP PACEART REMOTE DEVICE CHECK
Date Time Interrogation Session: 20210408042937
Implantable Pulse Generator Implant Date: 20201228

## 2019-05-05 NOTE — Progress Notes (Signed)
ILR Remote 

## 2019-05-09 ENCOUNTER — Telehealth: Payer: Self-pay | Admitting: *Deleted

## 2019-05-09 NOTE — Telephone Encounter (Signed)
LINQ alert received for tachy episode on 05/07/19 at 15:48, duration 2min 25sec. ECG appears SVT 170s-180s, consistent with previous episodes. 4 additional episodes reported but not avaiable.  Spoke with patient. He denies any symptoms with this specific episode, but reports he possibly noted some other "events" since then. Requested manual transmission for review. Pt verbalizes understanding and agrees to send one tonight, aware it will be reviewed tomorrow. Pt denies additional questions at this time.

## 2019-05-10 ENCOUNTER — Telehealth: Payer: Self-pay | Admitting: Internal Medicine

## 2019-05-10 ENCOUNTER — Telehealth (INDEPENDENT_AMBULATORY_CARE_PROVIDER_SITE_OTHER): Payer: Self-pay | Admitting: Internal Medicine

## 2019-05-10 NOTE — Telephone Encounter (Signed)
  Patient Consent for Virtual Visit         Earl [name] has provided verbal consent on 05/10/2019 for a virtual visit (video or telephone).   CONSENT FOR VIRTUAL VISIT FOR:  Earl [name]  By participating in this virtual visit I agree to the following:  I hereby voluntarily request, consent and authorize [facility] and its employed or contracted physicians, physician assistants, nurse practitioners or other licensed health care professionals (the Practitioner), to provide me with telemedicine health care services (the "Services") as deemed necessary by the treating Practitioner. I acknowledge and consent to receive the Services by the Practitioner via telemedicine. I understand that the telemedicine visit will involve communicating with the Practitioner through live audiovisual communication technology and the disclosure of certain medical information by electronic transmission. I acknowledge that I have been given the opportunity to request an in-person assessment or other available alternative prior to the telemedicine visit and am voluntarily participating in the telemedicine visit.  I understand that I have the right to withhold or withdraw my consent to the use of telemedicine in the course of my care at any time, without affecting my right to future care or treatment, and that the Practitioner or I may terminate the telemedicine visit at any time. I understand that I have the right to inspect all information obtained and/or recorded in the course of the telemedicine visit and may receive copies of available information for a reasonable fee.  I understand that some of the potential risks of receiving the Services via telemedicine include:  Marland Kitchen Delay or interruption in medical evaluation due to technological equipment failure or disruption; . Information transmitted may not be sufficient (e.g. poor resolution of images) to allow for appropriate medical decision making by the Practitioner;  and/or  . In rare instances, security protocols could fail, causing a breach of personal health information.  Furthermore, I acknowledge that it is my responsibility to provide information about my medical history, conditions and care that is complete and accurate to the best of my ability. I acknowledge that Practitioner's advice, recommendations, and/or decision may be based on factors not within their control, such as incomplete or inaccurate data provided by me or distortions of diagnostic images or specimens that may result from electronic transmissions. I understand that the practice of medicine is not an exact science and that Practitioner makes no warranties or guarantees regarding treatment outcomes. I acknowledge that a copy of this consent can be made available to me via my patient portal ([facility]), or I can request a printed copy by calling the office of [facility].    I understand that my insurance will be billed for this visit.   I have read or had this consent read to me. . I understand the contents of this consent, which adequately explains the benefits and risks of the Services being provided via telemedicine.  . I have been provided ample opportunity to ask questions regarding this consent and the Services and have had my questions answered to my satisfaction. . I give my informed consent for the services to be provided through the use of telemedicine in my medical care

## 2019-05-10 NOTE — Telephone Encounter (Signed)
Patient called. He finally answer his phone CT results reviewed. He has fatty liver.  Gallbladder wall thickening most likely due to poorly distended gallbladder. He has nonobstructing kidney stones.  He has history of kidney stones and has had blood pressure in the past.  He may also have a small cyst in the left kidney. 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 Patient advised to follow-up with his neurologist and neurologist. Please forward copy of CT to patient's PCP   Patient needs LFTs in 4 weeks and office visit in 2 months.

## 2019-05-11 ENCOUNTER — Ambulatory Visit: Payer: Medicaid Other

## 2019-05-11 NOTE — Telephone Encounter (Signed)
Spoke with patient. Advised that manual transmission has not yet been received. Pt verbalizes understanding and reports he forgot to send it on Monday. Pt states he will send in transmission this evening. Denies needing assistance with monitor or further questions at this time.

## 2019-05-13 ENCOUNTER — Encounter: Payer: Self-pay | Admitting: Internal Medicine

## 2019-05-13 ENCOUNTER — Telehealth (INDEPENDENT_AMBULATORY_CARE_PROVIDER_SITE_OTHER): Payer: Self-pay | Admitting: *Deleted

## 2019-05-13 ENCOUNTER — Telehealth (INDEPENDENT_AMBULATORY_CARE_PROVIDER_SITE_OTHER): Payer: Medicaid Other | Admitting: Internal Medicine

## 2019-05-13 ENCOUNTER — Other Ambulatory Visit (INDEPENDENT_AMBULATORY_CARE_PROVIDER_SITE_OTHER): Payer: Self-pay | Admitting: *Deleted

## 2019-05-13 VITALS — BP 130/80 | HR 81 | Ht 70.0 in | Wt 266.0 lb

## 2019-05-13 DIAGNOSIS — R7989 Other specified abnormal findings of blood chemistry: Secondary | ICD-10-CM

## 2019-05-13 DIAGNOSIS — I10 Essential (primary) hypertension: Secondary | ICD-10-CM | POA: Diagnosis not present

## 2019-05-13 DIAGNOSIS — F1010 Alcohol abuse, uncomplicated: Secondary | ICD-10-CM | POA: Diagnosis not present

## 2019-05-13 DIAGNOSIS — I471 Supraventricular tachycardia: Secondary | ICD-10-CM | POA: Diagnosis not present

## 2019-05-13 DIAGNOSIS — R945 Abnormal results of liver function studies: Secondary | ICD-10-CM

## 2019-05-13 LAB — CUP PACEART REMOTE DEVICE CHECK
Date Time Interrogation Session: 20210414151210
Implantable Pulse Generator Implant Date: 20201228

## 2019-05-13 NOTE — Progress Notes (Signed)
Electrophysiology TeleHealth Note  Due to national recommendations of social distancing due to [place] 19, an audio telehealth visit is felt to be most appropriate for this patient at this time.  Verbal consent was obtained by me for the telehealth visit today.  The patient does not have capability for a virtual visit.  A phone visit is therefore required today.   Date:  05/13/2019   ID:  Earl [name], DOB Jun 24, 1960, MRN [id number]  Location: patient's home  Provider location:  [facility]  Evaluation Performed: Follow-up visit  PCP:  Baruch Gouty, FNP   Electrophysiologist:  Dr Rayann Heman  Chief Complaint:  palpitations  History of Present Illness:    Earl [name] is a 59 y.o. male who presents via telehealth conferencing today.  Since last being seen in our clinic, the patient reports doing very well.  He has had several SVT episodes.  His overall heart rates have increased.  He feels that SVT episodes are short and not very bothersome to him. Today, he denies symptoms of chest pain, shortness of breath, lower extremity edema, dizziness, presyncope, or syncope.  The patient is otherwise without complaint today.  The patient denies symptoms of fevers, chills, cough, or new SOB worrisome for COVID 19.  Past Medical History:  Diagnosis Date   Alcohol abuse    Colon cancer ([place])    Depression    Gall stones    Hypertension    Kidney stone    Typical atrial flutter [facility])     Past Surgical History:  Procedure Laterality Date   A-FLUTTER ABLATION N/A 07/31/2016   Procedure: A-Flutter Ablation;  Surgeon: [name], MD;  Location: [facility];  Service: Cardiovascular;  Laterality: N/A;   COLONOSCOPY N/A 04/17/2016   Procedure: COLONOSCOPY;  Surgeon: [name], MD;  [facility];  Service: Endoscopy;  Laterality: N/A;  1200   COLONOSCOPY N/A 05/07/2017   Procedure: COLONOSCOPY;  Surgeon: [name], MD;  [facility];   Service: Endoscopy;  Laterality: N/A;  200   implantable loop recorder placement  01/24/2019    Medtronic Reveal Linq model LNQ 11 implantable loop recorder (SN [id number] ) by Dr Rayann Heman for evaluation of palpitations   MINOR CARPAL TUNNEL     right   POLYPECTOMY  04/17/2016   Procedure: POLYPECTOMY;  Surgeon: [name], MD;  [facility];  Service: Endoscopy;;  sigmoid    Current Outpatient Medications  Medication Sig Dispense Refill   ALPRAZolam (XANAX) 1 MG tablet Take 1 mg by mouth 2 (two) times daily as needed for anxiety.      chlorthalidone (HYGROTON) 25 MG tablet Take 1 tablet by mouth once daily 90 tablet 1   Cholecalciferol (VITAMIN D3) 1.25 MG (50000 UT) CAPS Take 1 capsule by mouth once a week.     diltiazem (CARDIZEM CD) 240 MG 24 hr capsule Take 1 capsule (240 mg total) by mouth daily. 90 capsule 1   levothyroxine (SYNTHROID) 50 MCG tablet Take 1 tablet (50 mcg total) by mouth daily. 90 tablet 0   lisinopril (ZESTRIL) 40 MG tablet Take 40 mg by mouth daily.     LYRICA 200 MG capsule Take 200 mg by mouth 2 (two) times daily.  2   Magnesium Hydroxide (MAGNESIA PO) Take 1 tablet by mouth daily.     Omega-3 Fatty Acids (OMEGA 3 PO) Take by mouth in the morning and at bedtime.  Potassium (POTASSIMIN PO) Take 2 tablets by mouth daily.      Red Yeast Rice Extract (RED YEAST RICE PO) Take by mouth in the morning and at bedtime.      zinc gluconate 50 MG tablet Take 50 mg by mouth every 30 (thirty) days. Take with vitamin d     No current facility-administered medications for this visit.    Allergies:   Amitriptyline, Amoxicillin, Asa [aspirin], Clonidine derivatives, Hydrochlorothiazide, and Lisinopril   Social History:  The patient  reports that he quit smoking about 40 years ago. He has never used smokeless tobacco. He reports previous alcohol use of about 8.0 standard drinks of alcohol per week. He reports that he does not use drugs.   Family  History:  [name] family history includes Alzheimer's disease in his mother; Coronary artery disease in his father and paternal grandfather; Heart disease in his father.   ROS:  Please see the history of present illness.   All other systems are personally reviewed and negative.    Exam:    Vital Signs:  BP 130/80    Pulse 81    Ht 5\' 10"  (1.778 m)    Wt 266 lb (120.7 kg)    BMI 38.17 kg/m   Well sounding, alert and conversant   Labs/Other Tests and Data Reviewed:    Recent Labs: 03/02/2019: Hemoglobin 15.2; Platelets 197; TSH 8.500 03/31/2019: ALT 91; BUN 10; Creat 1.07; Potassium 3.4; Sodium 140   Wt Readings from Last 3 Encounters:  05/13/19 266 lb (120.7 kg)  03/31/19 266 lb 3.2 oz (120.7 kg)  03/02/19 273 lb (123.8 kg)     Last device remote is reviewed from [facility] PDF which reveals normal device function,    ASSESSMENT & PLAN:    1.  SVT  Noted on ILR He takes diltiazem Therapeutic strategies for supraventricular tachycardia including medicine and ablation were discussed in detail with the patient today. Risk, benefits, and alternatives to EP study and radiofrequency ablation were also discussed in detail today.  He had prior atach and CTI flutter ablated by me.  He would prefer a more conservative approach currently. Ultimately, we could also consider flecainide.  He would prefer this over ablation  2. ETOH Avoidance is encouraged  3. HTN Stable No change required today  4. Obesity Lifestyle modification is advsied  Follow-up:  with me in 6 months   Patient Risk:  after full review of this patients clinical status, I feel that they are at moderate risk at this time.  Today, I have spent 15 minutes with the patient with telehealth technology discussing arrhythmia management .    Army Fossa, MD  05/13/2019 12:07 PM     [address] [place] [address] [phone] (office) [phone] (fax)

## 2019-05-13 NOTE — Telephone Encounter (Signed)
Per Dr.Rehman patient will need to have OV in 2 months.

## 2019-05-13 NOTE — Telephone Encounter (Signed)
Manual transmission received 05/11/19 and sent to [name] for review.

## 2019-05-17 DIAGNOSIS — Z23 Encounter for immunization: Secondary | ICD-10-CM | POA: Diagnosis not present

## 2019-05-18 ENCOUNTER — Other Ambulatory Visit: Payer: Self-pay | Admitting: *Deleted

## 2019-05-24 ENCOUNTER — Other Ambulatory Visit (INDEPENDENT_AMBULATORY_CARE_PROVIDER_SITE_OTHER): Payer: Self-pay | Admitting: *Deleted

## 2019-05-24 DIAGNOSIS — R7989 Other specified abnormal findings of blood chemistry: Secondary | ICD-10-CM

## 2019-05-24 DIAGNOSIS — R945 Abnormal results of liver function studies: Secondary | ICD-10-CM

## 2019-06-05 LAB — CUP PACEART REMOTE DEVICE CHECK
Date Time Interrogation Session: 20210509043418
Implantable Pulse Generator Implant Date: 20201228

## 2019-06-06 ENCOUNTER — Ambulatory Visit: Payer: Medicaid Other | Admitting: Family

## 2019-06-06 ENCOUNTER — Other Ambulatory Visit: Payer: Self-pay

## 2019-06-06 ENCOUNTER — Telehealth: Payer: Self-pay | Admitting: *Deleted

## 2019-06-06 ENCOUNTER — Ambulatory Visit (INDEPENDENT_AMBULATORY_CARE_PROVIDER_SITE_OTHER): Payer: Medicaid Other | Admitting: Family

## 2019-06-06 ENCOUNTER — Encounter: Payer: Self-pay | Admitting: Family

## 2019-06-06 ENCOUNTER — Ambulatory Visit: Payer: Medicaid Other | Admitting: Family Medicine

## 2019-06-06 VITALS — BP 123/77 | HR 76 | Temp 98.7°F | Ht 70.0 in | Wt 278.6 lb

## 2019-06-06 DIAGNOSIS — Z7689 Persons encountering health services in other specified circumstances: Secondary | ICD-10-CM | POA: Diagnosis not present

## 2019-06-06 DIAGNOSIS — M159 Polyosteoarthritis, unspecified: Secondary | ICD-10-CM

## 2019-06-06 DIAGNOSIS — I484 Atypical atrial flutter: Secondary | ICD-10-CM | POA: Diagnosis not present

## 2019-06-06 DIAGNOSIS — E559 Vitamin D deficiency, unspecified: Secondary | ICD-10-CM | POA: Diagnosis not present

## 2019-06-06 DIAGNOSIS — I10 Essential (primary) hypertension: Secondary | ICD-10-CM

## 2019-06-06 DIAGNOSIS — E538 Deficiency of other specified B group vitamins: Secondary | ICD-10-CM

## 2019-06-06 DIAGNOSIS — F1020 Alcohol dependence, uncomplicated: Secondary | ICD-10-CM

## 2019-06-06 DIAGNOSIS — F339 Major depressive disorder, recurrent, unspecified: Secondary | ICD-10-CM

## 2019-06-06 DIAGNOSIS — Z789 Other specified health status: Secondary | ICD-10-CM

## 2019-06-06 DIAGNOSIS — E039 Hypothyroidism, unspecified: Secondary | ICD-10-CM | POA: Diagnosis not present

## 2019-06-06 DIAGNOSIS — R319 Hematuria, unspecified: Secondary | ICD-10-CM

## 2019-06-06 DIAGNOSIS — Z7289 Other problems related to lifestyle: Secondary | ICD-10-CM

## 2019-06-06 DIAGNOSIS — G629 Polyneuropathy, unspecified: Secondary | ICD-10-CM | POA: Diagnosis not present

## 2019-06-06 DIAGNOSIS — E782 Mixed hyperlipidemia: Secondary | ICD-10-CM

## 2019-06-06 LAB — URINALYSIS, COMPLETE
Bilirubin, UA: NEGATIVE
Glucose, UA: NEGATIVE
Leukocytes,UA: NEGATIVE
Nitrite, UA: NEGATIVE
RBC, UA: NEGATIVE
Specific Gravity, UA: 1.025 (ref 1.005–1.030)
Urobilinogen, Ur: 1 mg/dL (ref 0.2–1.0)
pH, UA: 6 (ref 5.0–7.5)

## 2019-06-06 LAB — MICROSCOPIC EXAMINATION
Bacteria, UA: NONE SEEN
Epithelial Cells (non renal): NONE SEEN /hpf (ref 0–10)
Renal Epithel, UA: NONE SEEN /hpf

## 2019-06-06 MED ORDER — VORTIOXETINE HBR 10 MG PO TABS: 10.0000 mg | ORAL_TABLET | Freq: Every day | ORAL | 1 refills | Status: DC

## 2019-06-06 MED ORDER — DICLOFENAC SODIUM 75 MG PO TBEC: 75.0000 mg | DELAYED_RELEASE_TABLET | Freq: Two times a day (BID) | ORAL | 2 refills | Status: DC

## 2019-06-06 MED ORDER — LISINOPRIL 20 MG PO TABS: 20.0000 mg | ORAL_TABLET | Freq: Every day | ORAL | 3 refills | Status: DC

## 2019-06-06 MED ORDER — LEVOTHYROXINE SODIUM 50 MCG PO TABS: 50.0000 ug | ORAL_TABLET | Freq: Every day | ORAL | 0 refills | Status: DC

## 2019-06-06 NOTE — Patient Instructions (Signed)
Major Depressive Disorder, Adult Major depressive disorder (MDD) is a mental health condition. It may also be called clinical depression or unipolar depression. MDD usually causes feelings of sadness, hopelessness, or helplessness. MDD can also cause physical symptoms. It can interfere with work, school, relationships, and other everyday activities. MDD may be mild, moderate, or severe. It may occur once (single episode major depressive disorder) or it may occur multiple times (recurrent major depressive disorder). What are the causes? The exact cause of this condition is not known. MDD is most likely caused by a combination of things, which may include:  Genetic factors. These are traits that are passed along from parent to child.  Individual factors. Your personality, your behavior, and the way you handle your thoughts and feelings may contribute to MDD. This includes personality traits and behaviors learned from others.  Physical factors, such as: ? Differences in the part of your brain that controls emotion. This part of your brain may be different than it is in people who do not have MDD. ? Long-term (chronic) medical or psychiatric illnesses.  Social factors. Traumatic experiences or major life changes may play a role in the development of MDD. What increases the risk? This condition is more likely to develop in women. The following factors may also make you more likely to develop MDD:  A family history of depression.  Troubled family relationships.  Abnormally low levels of certain brain chemicals.  Traumatic events in childhood, especially abuse or the loss of a parent.  Being under a lot of stress, or long-term stress, especially from upsetting life experiences or losses.  A history of: ? Chronic physical illness. ? Other mental health disorders. ? Substance abuse.  Poor living conditions.  Experiencing social exclusion or discrimination on a regular basis. What are the  signs or symptoms? The main symptoms of MDD typically include:  Constant depressed or irritable mood.  Loss of interest in things and activities. MDD symptoms may also include:  Sleeping or eating too much or too little.  Unexplained weight change.  Fatigue or low energy.  Feelings of worthlessness or guilt.  Difficulty thinking clearly or making decisions.  Thoughts of suicide or of harming others.  Physical agitation or weakness.  Isolation. Severe cases of MDD may also occur with other symptoms, such as:  Delusions or hallucinations, in which you imagine things that are not real (psychotic depression).  Low-level depression that lasts at least a year (chronic depression or persistent depressive disorder).  Extreme sadness and hopelessness (melancholic depression).  Trouble speaking and moving (catatonic depression). How is this diagnosed? This condition may be diagnosed based on:  Your symptoms.  Your medical history, including your mental health history. This may involve tests to evaluate your mental health. You may be asked questions about your lifestyle, including any drug and alcohol use, and how long you have had symptoms of MDD.  A physical exam.  Blood tests to rule out other conditions. You must have a depressed mood and at least four other MDD symptoms most of the day, nearly every day in the same 2-week timeframe before your health care provider can confirm a diagnosis of MDD. How is this treated? This condition is usually treated by mental health professionals, such as psychologists, psychiatrists, and clinical social workers. You may need more than one type of treatment. Treatment may include:  Psychotherapy. This is also called talk therapy or counseling. Types of psychotherapy include: ? Cognitive behavioral therapy (CBT). This type of therapy   teaches you to recognize unhealthy feelings, thoughts, and behaviors, and replace them with positive thoughts  and actions. ? Interpersonal therapy (IPT). This helps you to improve the way you relate to and communicate with others. ? Family therapy. This treatment includes members of your family.  Medicine to treat anxiety and depression, or to help you control certain emotions and behaviors.  Lifestyle changes, such as: ? Limiting alcohol and drug use. ? Exercising regularly. ? Getting plenty of sleep. ? Making healthy eating choices. ? Spending more time outdoors.  Treatments involving stimulation of the brain can be used in situations with extremely severe symptoms, or when medicine or other therapies do not work over time. These treatments include electroconvulsive therapy, transcranial magnetic stimulation, and vagal nerve stimulation. Follow these instructions at home: Activity  Return to your normal activities as told by your health care provider.  Exercise regularly and spend time outdoors as told by your health care provider. General instructions  Take over-the-counter and prescription medicines only as told by your health care provider.  Do not drink alcohol. If you drink alcohol, limit your alcohol intake to no more than 1 drink a day for nonpregnant women and 2 drinks a day for men. One drink equals 12 oz of beer, 5 oz of wine, or 1 oz of hard liquor. Alcohol can affect any antidepressant medicines you are taking. Talk to your health care provider about your alcohol use.  Eat a healthy diet and get plenty of sleep.  Find activities that you enjoy doing, and make time to do them.  Consider joining a support group. Your health care provider may be able to recommend a support group.  Keep all follow-up visits as told by your health care provider. This is important. Where to find more information National Alliance on Mental Illness  www.nami.org [place] National Institute of Mental Health  www.nimh.nih.gov National Suicide Prevention Lifeline  1-800-273-TALK (8255). This is  free, 24-hour help. Contact a health care provider if:  Your symptoms get worse.  You develop new symptoms. Get help right away if:  You self-harm.  You have serious thoughts about hurting yourself or others.  You see, hear, taste, smell, or feel things that are not present (hallucinate). This information is not intended to replace advice given to you by your health care provider. Make sure you discuss any questions you have with your health care provider. Document Revised: 12/26/2016 Document Reviewed: 07/25/2015 Elsevier Patient Education  2020 Elsevier Inc.  

## 2019-06-06 NOTE — Telephone Encounter (Signed)
Trintellix is denied by insurance. He has tried cymbalta, buy MUST TRY ONE more. (wellbutrin, Pristiq or venlafaxine or remeron)

## 2019-06-06 NOTE — Telephone Encounter (Signed)
Pt insurance denied diclofenac. Must use naproxen or mobic. Please advise

## 2019-06-06 NOTE — Progress Notes (Signed)
Subjective:    Patient ID: Earl [name], male    DOB: [birth year], 59 y.o.   MRN: [id number]  Chief Complaint  Patient presents with  . Medical Management of Chronic Issues    Wants testosteron checked,neasea wants b12 shot    Pt presents to the office today to establish care with me. He is followed by GI for hx colon cancer and is followed every 3 months. He is followed by Cardiologists annually for Atrial Flutter. He is also followed by Neurologists for neuropathy. He is currently prescribing his Lyrica and Xanax.    He reports his depression is worse. He states he has tried multiple medications that include Zoloft, lexapro, Prozac, and Cymbalta.   He reports he drinks a fifth of wine daily.  Hypertension This is a chronic problem. The current episode started more than 1 year ago. The problem has been resolved since onset. The problem is controlled. Associated symptoms include malaise/fatigue and shortness of breath. Pertinent negatives include no headaches or peripheral edema. Risk factors for coronary artery disease include diabetes mellitus, obesity, male gender and sedentary lifestyle. Past treatments include ACE inhibitors and calcium channel blockers. The current treatment provides moderate improvement. Hypertensive end-organ damage includes CAD/MI. Identifiable causes of hypertension include a thyroid problem.  Thyroid Problem Presents for follow-up visit. Symptoms include depressed mood, dry skin and fatigue. Patient reports no constipation or diarrhea. The symptoms have been stable. His past medical history is significant for hyperlipidemia.  Hyperlipidemia This is a chronic problem. The current episode started more than 1 year ago. The problem is uncontrolled. Recent lipid tests were reviewed and are high. Exacerbating diseases include hypothyroidism. Associated symptoms include shortness of breath. The current treatment provides moderate improvement of lipids. Risk factors for  coronary artery disease include dyslipidemia, male sex, hypertension and a sedentary lifestyle.  Depression        This is a chronic problem.  The current episode started more than 1 year ago.   The onset quality is gradual.   The problem occurs intermittently.  Associated symptoms include fatigue, helplessness, hopelessness, irritable, restlessness and sad.  Associated symptoms include no headaches.  Past medical history includes hypothyroidism and thyroid problem.   Hematuria This is a new problem. The current episode started more than 1 month ago. The problem has been waxing and waning since onset. He describes the hematuria as gross hematuria. His past medical history is significant for hypertension.  Arthritis Presents for follow-up visit. He complains of pain. The symptoms have been stable. Affected locations include the right knee and left knee (backk). His pain is at a severity of 8/10. Associated symptoms include fatigue. Pertinent negatives include no diarrhea. Compliance problems: takes Voltaren 75 BID that helps, states this has never caused any allergies to him.       Review of Systems  Constitutional: Positive for fatigue and malaise/fatigue.  Respiratory: Positive for shortness of breath.   Gastrointestinal: Negative for constipation and diarrhea.  Genitourinary: Positive for hematuria.  Musculoskeletal: Positive for arthritis.  Neurological: Negative for headaches.  Psychiatric/Behavioral: Positive for depression.  All other systems reviewed and are negative.      Objective:   Physical Exam Vitals reviewed.  Constitutional:      General: He is irritable. He is not in acute distress.    Appearance: He is well-developed.  HENT:     Head: Normocephalic.     Right Ear: Tympanic membrane normal.     Left Ear: Tympanic membrane normal.  Eyes:     General:        Right eye: No discharge.        Left eye: No discharge.     Pupils: Pupils are equal, round, and reactive to  light.  Neck:     Thyroid: No thyromegaly.  Cardiovascular:     Rate and Rhythm: Normal rate and regular rhythm.     Heart sounds: Normal heart sounds. No murmur.  Pulmonary:     Effort: Pulmonary effort is normal. No respiratory distress.     Breath sounds: Normal breath sounds. No wheezing.  Abdominal:     General: Bowel sounds are normal. There is no distension.     Palpations: Abdomen is soft.     Tenderness: There is no abdominal tenderness.  Musculoskeletal:        General: No tenderness. Normal range of motion.     Cervical back: Normal range of motion and neck supple.     Right lower leg: Edema (trace) present.     Left lower leg: Edema (trace) present.  Skin:    General: Skin is warm and dry.     Findings: No erythema or rash.  Neurological:     Mental Status: He is alert and oriented to person, place, and time.     Cranial Nerves: No cranial nerve deficit.     Deep Tendon Reflexes: Reflexes are normal and symmetric.  Psychiatric:        Behavior: Behavior normal.        Thought Content: Thought content normal.        Judgment: Judgment normal.       BP 123/77   Pulse 76   Temp 98.7 F (37.1 C) (Temporal)   Ht _0  (1.778 m)   Wt 278 lb 9.6 oz (126.4 kg)   SpO2 97%   BMI 39.97 kg/m      Assessment & Plan:  Earl [name] comes in today with chief complaint of Medical Management of Chronic Issues (Wants testosteron checked,neasea wants b12 shot )   Diagnosis and orders addressed:  1. Atypical atrial flutter (HCC) - CMP14+EGFR  2. Essential hypertension -Will decrease Lisinopril to 20 mg from 40 mg. Pt has been out of medication for 2 weeks and BP is at goal. - CMP14+EGFR - lisinopril (ZESTRIL) 20 MG tablet; Take 1 tablet (20 mg total) by mouth daily.  Dispense: 90 tablet; Refill: 3  3. Acquired hypothyroidism - levothyroxine (SYNTHROID) 50 MCG tablet; Take 1 tablet (50 mcg total) by mouth daily.  Dispense: 90 tablet; Refill: 0 - CMP14+EGFR -  TSH  4. Morbid obesity ([place]) - CMP14+EGFR  5. Mixed hyperlipidemia - CMP14+EGFR  6. Vitamin D deficiency - CMP14+EGFR  7. Vitamin B 12 deficiency - Anemia Profile B - CMP14+EGFR  8. Neuropathy - CMP14+EGFR  9. Depression, recurrent ([place]) Will start Trintellix today Stress management  - CMP14+EGFR - vortioxetine HBr (TRINTELLIX) 10 MG TABS tablet; Take 1 tablet (10 mg total) by mouth daily.  Dispense: 90 tablet; Refill: 1  10. Alcohol use - Anemia Profile B - CMP14+EGFR  11. ETOHism ([place]) - Anemia Profile B - CMP14+EGFR  12. Hematuria, unspecified type - CMP14+EGFR - Urinalysis, Complete - Ambulatory referral to Urology  13. Osteoarthritis of multiple joints, unspecified osteoarthritis type Will prescribe Voltaren today, states he has taken this for years with no problem. Discussed importance of any new rash, swelling, SOB stop medication and go to ED.  - diclofenac (VOLTAREN) 75 MG EC  tablet; Take 1 tablet (75 mg total) by mouth 2 (two) times daily.  Dispense: 60 tablet; Refill: 2   Labs pending Health Maintenance reviewed Diet and exercise encouraged  Follow up plan: 4 weeks to recheck HTN and Depression   Evelina Dun, FNP

## 2019-06-07 ENCOUNTER — Telehealth: Payer: Self-pay

## 2019-06-07 ENCOUNTER — Ambulatory Visit (INDEPENDENT_AMBULATORY_CARE_PROVIDER_SITE_OTHER): Payer: Medicaid Other | Admitting: *Deleted

## 2019-06-07 DIAGNOSIS — R002 Palpitations: Secondary | ICD-10-CM | POA: Diagnosis not present

## 2019-06-07 LAB — ANEMIA PROFILE B
Basophils Absolute: 0.1 10*3/uL (ref 0.0–0.2)
Basos: 1 %
EOS (ABSOLUTE): 0.2 10*3/uL (ref 0.0–0.4)
Eos: 2 %
Ferritin: 448 ng/mL — ABNORMAL HIGH (ref 30–400)
Folate: 2.8 ng/mL — ABNORMAL LOW (ref >3.0)
Hematocrit: 43.3 % (ref 37.5–51.0)
Hemoglobin: 15.1 g/dL (ref 13.0–17.7)
Immature Grans (Abs): 0 10*3/uL (ref 0.0–0.1)
Immature Granulocytes: 0 %
Iron Saturation: 23 % (ref 15–55)
Iron: 56 ug/dL (ref 38–169)
Lymphocytes Absolute: 2.2 10*3/uL (ref 0.7–3.1)
Lymphs: 24 %
MCH: 34.6 pg — ABNORMAL HIGH (ref 26.6–33.0)
MCHC: 34.9 g/dL (ref 31.5–35.7)
MCV: 99 fL — ABNORMAL HIGH (ref 79–97)
Monocytes Absolute: 0.7 10*3/uL (ref 0.1–0.9)
Monocytes: 7 %
Neutrophils Absolute: 6.3 10*3/uL (ref 1.4–7.0)
Neutrophils: 66 %
Platelets: 218 10*3/uL (ref 150–450)
RBC: 4.36 x10E6/uL (ref 4.14–5.80)
RDW: 12.7 % (ref 11.6–15.4)
Retic Ct Pct: 2.3 % (ref 0.6–2.6)
Total Iron Binding Capacity: 248 ug/dL — ABNORMAL LOW (ref 250–450)
UIBC: 192 ug/dL (ref 111–343)
Vitamin B-12: 526 pg/mL (ref 232–1245)
WBC: 9.5 10*3/uL (ref 3.4–10.8)

## 2019-06-07 LAB — CMP14+EGFR
ALT: 57 IU/L — ABNORMAL HIGH (ref 0–44)
AST: 147 IU/L — ABNORMAL HIGH (ref 0–40)
Albumin/Globulin Ratio: 1 — ABNORMAL LOW (ref 1.2–2.2)
Albumin: 3.5 g/dL — ABNORMAL LOW (ref 3.8–4.9)
Alkaline Phosphatase: 182 IU/L — ABNORMAL HIGH (ref 39–117)
BUN/Creatinine Ratio: 8 — ABNORMAL LOW (ref 10–24)
BUN: 8 mg/dL (ref 8–27)
Bilirubin Total: 0.5 mg/dL (ref 0.0–1.2)
CO2: 22 mmol/L (ref 20–29)
Calcium: 8.2 mg/dL — ABNORMAL LOW (ref 8.6–10.2)
Chloride: 105 mmol/L (ref 96–106)
Creatinine, Ser: 0.99 mg/dL (ref 0.76–1.27)
GFR calc Af Amer: 95 mL/min/{1.73_m2} (ref >59)
GFR calc non Af Amer: 82 mL/min/{1.73_m2} (ref >59)
Globulin, Total: 3.4 g/dL (ref 1.5–4.5)
Glucose: 97 mg/dL (ref 65–99)
Potassium: 3.8 mmol/L (ref 3.5–5.2)
Sodium: 143 mmol/L (ref 134–144)
Total Protein: 6.9 g/dL (ref 6.0–8.5)

## 2019-06-07 LAB — TSH: TSH: 6.32 u[IU]/mL — ABNORMAL HIGH (ref 0.450–4.500)

## 2019-06-07 NOTE — Telephone Encounter (Signed)
Carelink alert received- possible AF/ AFl   episodePatient with known history of SVT.  Current meds include Diltiazem 240mg  daily

## 2019-06-07 NOTE — Telephone Encounter (Signed)
TTC pt phone just kept ringing, no answer and no VM.

## 2019-06-07 NOTE — Telephone Encounter (Signed)
Per insurance he must try one of the following wellbutrin, Pristiq or venlafaxine or remeron before approving Trintellix. Does he know if he has taken any of these before?

## 2019-06-08 ENCOUNTER — Telehealth: Payer: Self-pay | Admitting: Family

## 2019-06-08 ENCOUNTER — Other Ambulatory Visit: Payer: Self-pay | Admitting: Cardiovascular Disease

## 2019-06-08 NOTE — Telephone Encounter (Signed)
Trintellix 10mg  #30 for 30 days approved for 365 days [place] approval [id number]  Called patient to notify him.  He will call [facility] to have medication filled.  Will route message to PCP

## 2019-06-08 NOTE — Telephone Encounter (Signed)
CALL placed to patient to discuss past tried/failed therapies Per patient report, Has tried Wellbutrin and Effexor in past Will call [place] [facility] for approval  [place] [facility] [phone]

## 2019-06-09 ENCOUNTER — Other Ambulatory Visit: Payer: Self-pay | Admitting: Family

## 2019-06-09 MED ORDER — LEVOTHYROXINE SODIUM 75 MCG PO TABS
75.0000 ug | ORAL_TABLET | Freq: Every day | ORAL | 11 refills | Status: DC
Start: 2019-06-09 — End: 2019-07-04

## 2019-06-09 NOTE — Progress Notes (Signed)
Patient aware and verbalizes understanding.  States that he just picked up levo 50 mcg.  Patient would like to know if you can send in a 12mcg so he use the 50 mcg too.  Please advise

## 2019-06-09 NOTE — Progress Notes (Signed)
Carelink Summary Report / Loop Recorder 

## 2019-06-10 DIAGNOSIS — Z7689 Persons encountering health services in other specified circumstances: Secondary | ICD-10-CM | POA: Diagnosis not present

## 2019-06-10 DIAGNOSIS — R945 Abnormal results of liver function studies: Secondary | ICD-10-CM | POA: Diagnosis not present

## 2019-06-10 DIAGNOSIS — R7989 Other specified abnormal findings of blood chemistry: Secondary | ICD-10-CM | POA: Diagnosis not present

## 2019-06-10 LAB — HEPATIC FUNCTION PANEL
AG Ratio: 1.2 (calc) (ref 1.0–2.5)
ALT: 42 U/L (ref 9–46)
AST: 107 U/L — ABNORMAL HIGH (ref 10–35)
Albumin: 3.7 g/dL (ref 3.6–5.1)
Alkaline phosphatase (APISO): 159 U/L — ABNORMAL HIGH (ref 35–144)
Bilirubin, Direct: 0.4 mg/dL — ABNORMAL HIGH (ref 0.0–0.2)
Globulin: 3.1 g/dL (calc) (ref 1.9–3.7)
Indirect Bilirubin: 1 mg/dL (calc) (ref 0.2–1.2)
Total Bilirubin: 1.4 mg/dL — ABNORMAL HIGH (ref 0.2–1.2)
Total Protein: 6.8 g/dL (ref 6.1–8.1)

## 2019-06-12 NOTE — Telephone Encounter (Signed)
Not clearly AF, continue to monitor

## 2019-06-14 ENCOUNTER — Other Ambulatory Visit (INDEPENDENT_AMBULATORY_CARE_PROVIDER_SITE_OTHER): Payer: Self-pay | Admitting: *Deleted

## 2019-06-14 DIAGNOSIS — Z23 Encounter for immunization: Secondary | ICD-10-CM | POA: Diagnosis not present

## 2019-06-14 DIAGNOSIS — R7989 Other specified abnormal findings of blood chemistry: Secondary | ICD-10-CM

## 2019-06-21 ENCOUNTER — Telehealth: Payer: Self-pay | Admitting: Family

## 2019-06-21 NOTE — Telephone Encounter (Signed)
Aware could have a simple virus.  Try gas pills for bloating and nausea . Symptoms could come from covid injection.  Wait few more days to see if symptoms go away.

## 2019-07-04 ENCOUNTER — Other Ambulatory Visit: Payer: Self-pay

## 2019-07-04 ENCOUNTER — Encounter: Payer: Self-pay | Admitting: Family

## 2019-07-04 ENCOUNTER — Ambulatory Visit (INDEPENDENT_AMBULATORY_CARE_PROVIDER_SITE_OTHER): Payer: Medicaid Other | Admitting: Family

## 2019-07-04 VITALS — BP 142/90 | HR 81 | Temp 98.2°F | Ht 70.0 in | Wt 273.4 lb

## 2019-07-04 DIAGNOSIS — Z7289 Other problems related to lifestyle: Secondary | ICD-10-CM

## 2019-07-04 DIAGNOSIS — Z789 Other specified health status: Secondary | ICD-10-CM

## 2019-07-04 DIAGNOSIS — R140 Abdominal distension (gaseous): Secondary | ICD-10-CM

## 2019-07-04 DIAGNOSIS — F1020 Alcohol dependence, uncomplicated: Secondary | ICD-10-CM | POA: Diagnosis not present

## 2019-07-04 DIAGNOSIS — E039 Hypothyroidism, unspecified: Secondary | ICD-10-CM | POA: Diagnosis not present

## 2019-07-04 DIAGNOSIS — F339 Major depressive disorder, recurrent, unspecified: Secondary | ICD-10-CM | POA: Diagnosis not present

## 2019-07-04 DIAGNOSIS — Z7689 Persons encountering health services in other specified circumstances: Secondary | ICD-10-CM | POA: Diagnosis not present

## 2019-07-04 MED ORDER — ONDANSETRON HCL 4 MG PO TABS: 4.0000 mg | ORAL_TABLET | Freq: Three times a day (TID) | ORAL | 0 refills | Status: DC | PRN

## 2019-07-04 MED ORDER — LEVOTHYROXINE SODIUM 50 MCG PO TABS: 50.0000 ug | ORAL_TABLET | Freq: Every day | ORAL | 3 refills | Status: DC

## 2019-07-04 NOTE — Progress Notes (Signed)
Subjective:    Patient ID: Earl [name], male    DOB: 1960-09-22, 59 y.o.   MRN: [id number]  Chief Complaint  Patient presents with  . Hypertension    4 week follow up  . Depression  . Abdominal Pain    Patient states that since he has been on the levo 75mg  and trintellix 10mg  he has been having abd pain and n&v.  Patient has stopped both medications.   Pt presents to the office today recheck depression and HTN. Pt was started on Trintellix, but states he stopped this medication because of nausea. He is not seeing [facility], because he "can't get in".   He reports he has only been taking Lyrica, Cardizem, and omeprazole.   He is complaining of abdominal fullness. He is followed by GI. He had elevated liver enzymes. He reports he quit drinking a week ago.  Hypertension This is a chronic problem. The current episode started more than 1 year ago. The problem has been waxing and waning since onset. The problem is uncontrolled. Associated symptoms include malaise/fatigue and shortness of breath. Pertinent negatives include no headaches or peripheral edema. Risk factors for coronary artery disease include obesity and male gender. Past treatments include nothing. The current treatment provides no improvement. There is no history of CVA or heart failure. Identifiable causes of hypertension include a thyroid problem.  Depression        This is a chronic problem.  The current episode started more than 1 year ago.   The onset quality is gradual.   The problem occurs intermittently.  Associated symptoms include fatigue, helplessness, hopelessness, irritable, restlessness and sad.  Associated symptoms include no headaches.  Past treatments include nothing.  Compliance with treatment is poor.  Past medical history includes thyroid problem.   Abdominal Pain This is a new problem. The current episode started more than 1 month ago. The onset quality is gradual. The problem occurs constantly. The  pain is located in the generalized abdominal region. Quality: fullness. Associated symptoms include constipation, nausea and vomiting. Pertinent negatives include no belching, diarrhea, headaches, hematochezia or hematuria. He has tried proton pump inhibitors for the symptoms. The treatment provided mild relief.  Thyroid Problem Presents for follow-up visit. Symptoms include constipation and fatigue. Patient reports no diarrhea. The symptoms have been stable. There is no history of heart failure.      Review of Systems  Constitutional: Positive for fatigue and malaise/fatigue.  Respiratory: Positive for shortness of breath.   Gastrointestinal: Positive for abdominal pain, constipation, nausea and vomiting. Negative for diarrhea and hematochezia.  Genitourinary: Negative for hematuria.  Neurological: Negative for headaches.  Psychiatric/Behavioral: Positive for depression.  All other systems reviewed and are negative.      Objective:   Physical Exam Vitals reviewed.  Constitutional:      General: He is irritable. He is not in acute distress.    Appearance: He is well-developed. He is obese.  HENT:     Head: Normocephalic.     Right Ear: External ear normal.     Left Ear: External ear normal.  Eyes:     General:        Right eye: No discharge.        Left eye: No discharge.     Pupils: Pupils are equal, round, and reactive to light.  Neck:     Thyroid: No thyromegaly.  Cardiovascular:     Rate and Rhythm: Normal rate and regular rhythm.  Heart sounds: Normal heart sounds. No murmur.  Pulmonary:     Effort: Pulmonary effort is normal. No respiratory distress.     Breath sounds: Normal breath sounds. No wheezing.  Abdominal:     General: Bowel sounds are normal. There is no distension.     Palpations: Abdomen is soft.     Tenderness: There is no abdominal tenderness.  Musculoskeletal:        General: No tenderness. Normal range of motion.     Cervical back: Normal range  of motion and neck supple.  Skin:    General: Skin is warm and dry.     Findings: No erythema or rash.  Neurological:     Mental Status: He is alert and oriented to person, place, and time.     Cranial Nerves: No cranial nerve deficit.     Deep Tendon Reflexes: Reflexes are normal and symmetric.  Psychiatric:        Behavior: Behavior is hyperactive.        Thought Content: Thought content normal.        Judgment: Judgment normal.       BP (!) 142/90   Pulse 81   Temp 98.2 F (36.8 C) (Temporal)   Ht 5\' 10"  (1.778 m)   Wt 273 lb 6.4 oz (124 kg)   SpO2 97%   BMI 39.23 kg/m      Assessment & Plan:  Earl [name] comes in today with chief complaint of Hypertension (4 week follow up), Depression, and Abdominal Pain (Patient states that since he has been on the levo 75mg  and trintellix 10mg  he has been having abd pain and n&v.  Patient has stopped both medications.)   Diagnosis and orders addressed:  1. Alcohol use  2. Depression, recurrent ([place]) -Encouraged patient to make behavorial health appt  3. ETOHism ([place])  4. Acquired hypothyroidism -Will restart levothyroxine at 50 mcg as he states he could tolerate that dose. Will do referral to Endo to discuss hypothyroidism per pt's request.  - levothyroxine (SYNTHROID) 50 MCG tablet; Take 1 tablet (50 mcg total) by mouth daily.  Dispense: 90 tablet; Refill: 3 - Ambulatory referral to Endocrinology  5. Abdominal bloating -Pt call GI today and make follow up appt   Labs reviewed Health Maintenance reviewed Diet and exercise encouraged  Follow up plan: 6 months    Evelina Dun, FNP

## 2019-07-04 NOTE — Patient Instructions (Signed)
Hypothyroidism  Hypothyroidism is when the thyroid gland does not make enough of certain hormones (it is underactive). The thyroid gland is a small gland located in the lower front part of the neck, just in front of the windpipe (trachea). This gland makes hormones that help control how the body uses food for energy (metabolism) as well as how the heart and brain function. These hormones also play a role in keeping your bones strong. When the thyroid is underactive, it produces too little of the hormones thyroxine (T4) and triiodothyronine (T3). What are the causes? This condition may be caused by:  Hashimoto's disease. This is a disease in which the body's disease-fighting system (immune system) attacks the thyroid gland. This is the most common cause.  Viral infections.  Pregnancy.  Certain medicines.  Birth defects.  Past radiation treatments to the head or neck for cancer.  Past treatment with radioactive iodine.  Past exposure to radiation in the environment.  Past surgical removal of part or all of the thyroid.  Problems with a gland in the center of the brain (pituitary gland).  Lack of enough iodine in the diet. What increases the risk? You are more likely to develop this condition if:  You are male.  You have a family history of thyroid conditions.  You use a medicine called lithium.  You take medicines that affect the immune system (immunosuppressants). What are the signs or symptoms? Symptoms of this condition include:  Feeling as though you have no energy (lethargy).  Not being able to tolerate cold.  Weight gain that is not explained by a change in diet or exercise habits.  Lack of appetite.  Dry skin.  Coarse hair.  Menstrual irregularity.  Slowing of thought processes.  Constipation.  Sadness or depression. How is this diagnosed? This condition may be diagnosed based on:  Your symptoms, your medical history, and a physical exam.  Blood  tests. You may also have imaging tests, such as an ultrasound or MRI. How is this treated? This condition is treated with medicine that replaces the thyroid hormones that your body does not make. After you begin treatment, it may take several weeks for symptoms to go away. Follow these instructions at home:  Take over-the-counter and prescription medicines only as told by your health care provider.  If you start taking any new medicines, tell your health care provider.  Keep all follow-up visits as told by your health care provider. This is important. ? As your condition improves, your dosage of thyroid hormone medicine may change. ? You will need to have blood tests regularly so that your health care provider can monitor your condition. Contact a health care provider if:  Your symptoms do not get better with treatment.  You are taking thyroid replacement medicine and you: ? Sweat a lot. ? Have tremors. ? Feel anxious. ? Lose weight rapidly. ? Cannot tolerate heat. ? Have emotional swings. ? Have diarrhea. ? Feel weak. Get help right away if you have:  Chest pain.  An irregular heartbeat.  A rapid heartbeat.  Difficulty breathing. Summary  Hypothyroidism is when the thyroid gland does not make enough of certain hormones (it is underactive).  When the thyroid is underactive, it produces too little of the hormones thyroxine (T4) and triiodothyronine (T3).  The most common cause is Hashimoto's disease, a disease in which the body's disease-fighting system (immune system) attacks the thyroid gland. The condition can also be caused by viral infections, medicine, pregnancy, or past   radiation treatment to the head or neck.  Symptoms may include weight gain, dry skin, constipation, feeling as though you do not have energy, and not being able to tolerate cold.  This condition is treated with medicine to replace the thyroid hormones that your body does not make. This information  is not intended to replace advice given to you by your health care provider. Make sure you discuss any questions you have with your health care provider. Document Revised: 12/26/2016 Document Reviewed: 12/24/2016 Elsevier Patient Education  2020 [employer]  

## 2019-07-11 ENCOUNTER — Ambulatory Visit (INDEPENDENT_AMBULATORY_CARE_PROVIDER_SITE_OTHER): Payer: Medicaid Other | Admitting: *Deleted

## 2019-07-11 DIAGNOSIS — R002 Palpitations: Secondary | ICD-10-CM

## 2019-07-11 LAB — CUP PACEART REMOTE DEVICE CHECK
Date Time Interrogation Session: 20210614002310
Implantable Pulse Generator Implant Date: 20201228

## 2019-07-12 NOTE — Progress Notes (Signed)
Carelink Summary Report / Loop Recorder 

## 2019-07-20 ENCOUNTER — Other Ambulatory Visit (INDEPENDENT_AMBULATORY_CARE_PROVIDER_SITE_OTHER): Payer: Self-pay | Admitting: *Deleted

## 2019-07-20 DIAGNOSIS — R7989 Other specified abnormal findings of blood chemistry: Secondary | ICD-10-CM

## 2019-07-26 ENCOUNTER — Other Ambulatory Visit: Payer: Self-pay

## 2019-07-26 ENCOUNTER — Encounter (HOSPITAL_COMMUNITY): Payer: Self-pay | Admitting: *Deleted

## 2019-07-26 ENCOUNTER — Inpatient Hospital Stay (HOSPITAL_COMMUNITY)
Admission: EM | Admit: 2019-07-26 | Discharge: 2019-07-30 | DRG: 177 | Disposition: A | Discharge status: Home / Self Care | Payer: Medicaid Other | Attending: Internal Medicine | Admitting: Internal Medicine

## 2019-07-26 ENCOUNTER — Emergency Department (HOSPITAL_COMMUNITY): Payer: Medicaid Other

## 2019-07-26 ENCOUNTER — Inpatient Hospital Stay (HOSPITAL_COMMUNITY): Payer: Medicaid Other

## 2019-07-26 DIAGNOSIS — M79601 Pain in right arm: Secondary | ICD-10-CM | POA: Diagnosis not present

## 2019-07-26 DIAGNOSIS — Z6839 Body mass index (BMI) 39.0-39.9, adult: Secondary | ICD-10-CM | POA: Diagnosis not present

## 2019-07-26 DIAGNOSIS — F1020 Alcohol dependence, uncomplicated: Secondary | ICD-10-CM | POA: Diagnosis present

## 2019-07-26 DIAGNOSIS — Z20822 Contact with and (suspected) exposure to covid-19: Secondary | ICD-10-CM | POA: Diagnosis present

## 2019-07-26 DIAGNOSIS — G9341 Metabolic encephalopathy: Secondary | ICD-10-CM | POA: Diagnosis present

## 2019-07-26 DIAGNOSIS — I10 Essential (primary) hypertension: Secondary | ICD-10-CM | POA: Diagnosis not present

## 2019-07-26 DIAGNOSIS — F10220 Alcohol dependence with intoxication, uncomplicated: Secondary | ICD-10-CM | POA: Diagnosis present

## 2019-07-26 DIAGNOSIS — M899 Disorder of bone, unspecified: Secondary | ICD-10-CM | POA: Diagnosis present

## 2019-07-26 DIAGNOSIS — E872 Acidosis, unspecified: Secondary | ICD-10-CM | POA: Diagnosis present

## 2019-07-26 DIAGNOSIS — K7031 Alcoholic cirrhosis of liver with ascites: Secondary | ICD-10-CM | POA: Diagnosis not present

## 2019-07-26 DIAGNOSIS — J189 Pneumonia, unspecified organism: Secondary | ICD-10-CM | POA: Diagnosis not present

## 2019-07-26 DIAGNOSIS — E669 Obesity, unspecified: Secondary | ICD-10-CM | POA: Diagnosis not present

## 2019-07-26 DIAGNOSIS — Y908 Blood alcohol level of 240 mg/100 ml or more: Secondary | ICD-10-CM | POA: Diagnosis present

## 2019-07-26 DIAGNOSIS — I483 Typical atrial flutter: Secondary | ICD-10-CM | POA: Diagnosis not present

## 2019-07-26 DIAGNOSIS — I9589 Other hypotension: Secondary | ICD-10-CM | POA: Diagnosis not present

## 2019-07-26 DIAGNOSIS — I484 Atypical atrial flutter: Secondary | ICD-10-CM | POA: Diagnosis not present

## 2019-07-26 DIAGNOSIS — E785 Hyperlipidemia, unspecified: Secondary | ICD-10-CM | POA: Diagnosis present

## 2019-07-26 DIAGNOSIS — F1011 Alcohol abuse, in remission: Secondary | ICD-10-CM | POA: Diagnosis present

## 2019-07-26 DIAGNOSIS — Z7289 Other problems related to lifestyle: Secondary | ICD-10-CM

## 2019-07-26 DIAGNOSIS — N200 Calculus of kidney: Secondary | ICD-10-CM | POA: Diagnosis not present

## 2019-07-26 DIAGNOSIS — K766 Portal hypertension: Secondary | ICD-10-CM | POA: Diagnosis not present

## 2019-07-26 DIAGNOSIS — R188 Other ascites: Secondary | ICD-10-CM

## 2019-07-26 DIAGNOSIS — I4892 Unspecified atrial flutter: Secondary | ICD-10-CM | POA: Diagnosis present

## 2019-07-26 DIAGNOSIS — Z87442 Personal history of urinary calculi: Secondary | ICD-10-CM

## 2019-07-26 DIAGNOSIS — E538 Deficiency of other specified B group vitamins: Secondary | ICD-10-CM | POA: Diagnosis present

## 2019-07-26 DIAGNOSIS — E039 Hypothyroidism, unspecified: Secondary | ICD-10-CM | POA: Diagnosis present

## 2019-07-26 DIAGNOSIS — J90 Pleural effusion, not elsewhere classified: Secondary | ICD-10-CM

## 2019-07-26 DIAGNOSIS — N179 Acute kidney failure, unspecified: Secondary | ICD-10-CM | POA: Diagnosis not present

## 2019-07-26 DIAGNOSIS — I471 Supraventricular tachycardia: Secondary | ICD-10-CM

## 2019-07-26 DIAGNOSIS — Z888 Allergy status to other drugs, medicaments and biological substances status: Secondary | ICD-10-CM

## 2019-07-26 DIAGNOSIS — F10920 Alcohol use, unspecified with intoxication, uncomplicated: Secondary | ICD-10-CM

## 2019-07-26 DIAGNOSIS — Z789 Other specified health status: Secondary | ICD-10-CM

## 2019-07-26 DIAGNOSIS — S4991XA Unspecified injury of right shoulder and upper arm, initial encounter: Secondary | ICD-10-CM | POA: Diagnosis not present

## 2019-07-26 DIAGNOSIS — Z79899 Other long term (current) drug therapy: Secondary | ICD-10-CM

## 2019-07-26 DIAGNOSIS — K7011 Alcoholic hepatitis with ascites: Secondary | ICD-10-CM | POA: Diagnosis present

## 2019-07-26 DIAGNOSIS — G9340 Encephalopathy, unspecified: Secondary | ICD-10-CM | POA: Diagnosis not present

## 2019-07-26 DIAGNOSIS — Z7901 Long term (current) use of anticoagulants: Secondary | ICD-10-CM

## 2019-07-26 DIAGNOSIS — E860 Dehydration: Secondary | ICD-10-CM | POA: Diagnosis present

## 2019-07-26 DIAGNOSIS — J690 Pneumonitis due to inhalation of food and vomit: Secondary | ICD-10-CM | POA: Diagnosis not present

## 2019-07-26 DIAGNOSIS — R5381 Other malaise: Secondary | ICD-10-CM | POA: Diagnosis not present

## 2019-07-26 DIAGNOSIS — Z8249 Family history of ischemic heart disease and other diseases of the circulatory system: Secondary | ICD-10-CM

## 2019-07-26 DIAGNOSIS — Z886 Allergy status to analgesic agent status: Secondary | ICD-10-CM

## 2019-07-26 DIAGNOSIS — M25511 Pain in right shoulder: Secondary | ICD-10-CM | POA: Diagnosis not present

## 2019-07-26 DIAGNOSIS — F329 Major depressive disorder, single episode, unspecified: Secondary | ICD-10-CM | POA: Diagnosis present

## 2019-07-26 DIAGNOSIS — R69 Illness, unspecified: Secondary | ICD-10-CM | POA: Diagnosis not present

## 2019-07-26 DIAGNOSIS — Z880 Allergy status to penicillin: Secondary | ICD-10-CM

## 2019-07-26 DIAGNOSIS — Z87891 Personal history of nicotine dependence: Secondary | ICD-10-CM

## 2019-07-26 DIAGNOSIS — Z85038 Personal history of other malignant neoplasm of large intestine: Secondary | ICD-10-CM

## 2019-07-26 DIAGNOSIS — I4729 Other ventricular tachycardia: Secondary | ICD-10-CM

## 2019-07-26 DIAGNOSIS — E559 Vitamin D deficiency, unspecified: Secondary | ICD-10-CM | POA: Diagnosis present

## 2019-07-26 DIAGNOSIS — F419 Anxiety disorder, unspecified: Secondary | ICD-10-CM | POA: Diagnosis present

## 2019-07-26 DIAGNOSIS — R52 Pain, unspecified: Secondary | ICD-10-CM

## 2019-07-26 DIAGNOSIS — T501X5A Adverse effect of loop [high-ceiling] diuretics, initial encounter: Secondary | ICD-10-CM | POA: Diagnosis not present

## 2019-07-26 DIAGNOSIS — K7469 Other cirrhosis of liver: Secondary | ICD-10-CM | POA: Diagnosis not present

## 2019-07-26 DIAGNOSIS — Z820 Family history of epilepsy and other diseases of the nervous system: Secondary | ICD-10-CM

## 2019-07-26 DIAGNOSIS — J9811 Atelectasis: Secondary | ICD-10-CM | POA: Diagnosis not present

## 2019-07-26 DIAGNOSIS — K5909 Other constipation: Secondary | ICD-10-CM | POA: Diagnosis not present

## 2019-07-26 DIAGNOSIS — Z79890 Hormone replacement therapy (postmenopausal): Secondary | ICD-10-CM

## 2019-07-26 DIAGNOSIS — I472 Ventricular tachycardia: Secondary | ICD-10-CM | POA: Diagnosis not present

## 2019-07-26 DIAGNOSIS — I700 Atherosclerosis of aorta: Secondary | ICD-10-CM | POA: Diagnosis not present

## 2019-07-26 LAB — ETHANOL: Alcohol, Ethyl (B): 258 mg/dL — ABNORMAL HIGH (ref <10)

## 2019-07-26 LAB — ALBUMIN, PLEURAL OR PERITONEAL FLUID: Albumin, Fluid: 1 g/dL

## 2019-07-26 LAB — TROPONIN I (HIGH SENSITIVITY)
Troponin I (High Sensitivity): 4 ng/L (ref <18)
Troponin I (High Sensitivity): 3 ng/L (ref <18)

## 2019-07-26 LAB — CBC WITH DIFFERENTIAL/PLATELET
Abs Immature Granulocytes: 0.04 10*3/uL (ref 0.00–0.07)
Basophils Absolute: 0.1 10*3/uL (ref 0.0–0.1)
Basophils Relative: 1 %
Eosinophils Absolute: 0.3 10*3/uL (ref 0.0–0.5)
Eosinophils Relative: 4 %
HCT: 45.8 % (ref 39.0–52.0)
Hemoglobin: 14.6 g/dL (ref 13.0–17.0)
Immature Granulocytes: 1 %
Lymphocytes Relative: 38 %
Lymphs Abs: 3.4 10*3/uL (ref 0.7–4.0)
MCH: 33.8 pg (ref 26.0–34.0)
MCHC: 31.9 g/dL (ref 30.0–36.0)
MCV: 106 fL — ABNORMAL HIGH (ref 80.0–100.0)
Monocytes Absolute: 0.9 10*3/uL (ref 0.1–1.0)
Monocytes Relative: 11 %
Neutro Abs: 4.1 10*3/uL (ref 1.7–7.7)
Neutrophils Relative %: 45 %
Platelets: 235 10*3/uL (ref 150–400)
RBC: 4.32 MIL/uL (ref 4.22–5.81)
RDW: 13.2 % (ref 11.5–15.5)
WBC: 8.8 10*3/uL (ref 4.0–10.5)
nRBC: 0 % (ref 0.0–0.2)

## 2019-07-26 LAB — URINALYSIS, ROUTINE W REFLEX MICROSCOPIC
Bilirubin Urine: NEGATIVE
Glucose, UA: NEGATIVE mg/dL
Hgb urine dipstick: NEGATIVE
Ketones, ur: NEGATIVE mg/dL
Leukocytes,Ua: NEGATIVE
Nitrite: NEGATIVE
Protein, ur: NEGATIVE mg/dL
Specific Gravity, Urine: 1.01 (ref 1.005–1.030)
pH: 5 (ref 5.0–8.0)

## 2019-07-26 LAB — COMPREHENSIVE METABOLIC PANEL
ALT: 29 U/L (ref 0–44)
AST: 77 U/L — ABNORMAL HIGH (ref 15–41)
Albumin: 2.8 g/dL — ABNORMAL LOW (ref 3.5–5.0)
Alkaline Phosphatase: 166 U/L — ABNORMAL HIGH (ref 38–126)
Anion gap: 10 (ref 5–15)
BUN: 10 mg/dL (ref 6–20)
CO2: 23 mmol/L (ref 22–32)
Calcium: 8 mg/dL — ABNORMAL LOW (ref 8.9–10.3)
Chloride: 104 mmol/L (ref 98–111)
Creatinine, Ser: 1.34 mg/dL — ABNORMAL HIGH (ref 0.61–1.24)
GFR calc Af Amer: >60 mL/min (ref >60)
GFR calc non Af Amer: 57 mL/min — ABNORMAL LOW (ref >60)
Glucose, Bld: 102 mg/dL — ABNORMAL HIGH (ref 70–99)
Potassium: 4.1 mmol/L (ref 3.5–5.1)
Sodium: 137 mmol/L (ref 135–145)
Total Bilirubin: 1.1 mg/dL (ref 0.3–1.2)
Total Protein: 6.4 g/dL — ABNORMAL LOW (ref 6.5–8.1)

## 2019-07-26 LAB — GRAM STAIN

## 2019-07-26 LAB — LIPASE, BLOOD: Lipase: 25 U/L (ref 11–51)

## 2019-07-26 LAB — CBG MONITORING, ED
Glucose-Capillary: 101 mg/dL — ABNORMAL HIGH (ref 70–99)
Glucose-Capillary: 88 mg/dL (ref 70–99)

## 2019-07-26 LAB — PHOSPHORUS: Phosphorus: 2.7 mg/dL (ref 2.5–4.6)

## 2019-07-26 LAB — BRAIN NATRIURETIC PEPTIDE
B Natriuretic Peptide: 37 pg/mL (ref 0.0–100.0)
B Natriuretic Peptide: 47 pg/mL (ref 0.0–100.0)

## 2019-07-26 LAB — BODY FLUID CELL COUNT WITH DIFFERENTIAL
Eos, Fluid: 0 %
Lymphs, Fluid: 59 %
Monocyte-Macrophage-Serous Fluid: 32 % — ABNORMAL LOW (ref 50–90)
Neutrophil Count, Fluid: 9 % (ref 0–25)
Other Cells, Fluid: 1 %
Total Nucleated Cell Count, Fluid: 423 cu mm (ref 0–1000)

## 2019-07-26 LAB — LACTATE DEHYDROGENASE, PLEURAL OR PERITONEAL FLUID: LD, Fluid: 53 U/L — ABNORMAL HIGH (ref 3–23)

## 2019-07-26 LAB — LACTIC ACID, PLASMA
Lactic Acid, Venous: 2.4 mmol/L (ref 0.5–1.9)
Lactic Acid, Venous: 2.6 mmol/L (ref 0.5–1.9)
Lactic Acid, Venous: 2.4 mmol/L (ref 0.5–1.9)
Lactic Acid, Venous: 2.4 mmol/L (ref 0.5–1.9)
Lactic Acid, Venous: 1.9 mmol/L (ref 0.5–1.9)

## 2019-07-26 LAB — PROTIME-INR
INR: 1.2 (ref 0.8–1.2)
Prothrombin Time: 14.8 seconds (ref 11.4–15.2)

## 2019-07-26 LAB — CALCIUM: Calcium: 7.6 mg/dL — ABNORMAL LOW (ref 8.9–10.3)

## 2019-07-26 LAB — HIV ANTIBODY (ROUTINE TESTING W REFLEX): HIV Screen 4th Generation wRfx: NONREACTIVE

## 2019-07-26 LAB — MAGNESIUM: Magnesium: 2 mg/dL (ref 1.7–2.4)

## 2019-07-26 LAB — AMMONIA: Ammonia: 42 umol/L — ABNORMAL HIGH (ref 9–35)

## 2019-07-26 LAB — HEPATITIS PANEL, ACUTE
HCV Ab: NONREACTIVE
Hep A IgM: NONREACTIVE
Hep B C IgM: NONREACTIVE
Hepatitis B Surface Ag: NONREACTIVE

## 2019-07-26 LAB — SARS CORONAVIRUS 2 BY RT PCR (HOSPITAL ORDER, PERFORMED IN ~~LOC~~ HOSPITAL LAB): SARS Coronavirus 2: NEGATIVE

## 2019-07-26 LAB — AMYLASE, PLEURAL OR PERITONEAL FLUID: Amylase, Fluid: <5 U/L

## 2019-07-26 LAB — GLUCOSE, PLEURAL OR PERITONEAL FLUID: Glucose, Fluid: 101 mg/dL

## 2019-07-26 LAB — TSH: TSH: 4.746 u[IU]/mL — ABNORMAL HIGH (ref 0.350–4.500)

## 2019-07-26 MED ORDER — VANCOMYCIN HCL 2000 MG/400ML IV SOLN
2000.0000 mg | Freq: Once | INTRAVENOUS | Status: AC
  Administered 2019-07-26: 2000 mg via INTRAVENOUS

## 2019-07-26 MED ORDER — THIAMINE HCL 100 MG/ML IJ SOLN
Freq: Once | INTRAVENOUS | Status: AC
  Filled 2019-07-26 (×2): qty 1000

## 2019-07-26 MED ORDER — ALBUTEROL SULFATE (2.5 MG/3ML) 0.083% IN NEBU: 2.5000 mg | INHALATION_SOLUTION | RESPIRATORY_TRACT | Status: DC | PRN

## 2019-07-26 MED ORDER — ONDANSETRON HCL 4 MG PO TABS: 4.0000 mg | ORAL_TABLET | Freq: Four times a day (QID) | ORAL | Status: DC | PRN

## 2019-07-26 MED ORDER — CHLORDIAZEPOXIDE HCL 25 MG PO CAPS
25.0000 mg | ORAL_CAPSULE | Freq: Four times a day (QID) | ORAL | Status: AC | PRN
  Administered 2019-07-26 – 2019-07-28 (×2): 25 mg via ORAL

## 2019-07-26 MED ORDER — FOLIC ACID 1 MG PO TABS
1.0000 mg | ORAL_TABLET | Freq: Every day | ORAL | Status: DC
  Administered 2019-07-26 – 2019-07-30 (×5): 1 mg via ORAL
  Filled 2019-07-26 (×5): qty 1

## 2019-07-26 MED ORDER — LEVOFLOXACIN IN D5W 750 MG/150ML IV SOLN
750.0000 mg | Freq: Once | INTRAVENOUS | Status: AC
  Administered 2019-07-26: 750 mg via INTRAVENOUS

## 2019-07-26 MED ORDER — THIAMINE HCL 100 MG PO TABS
100.0000 mg | ORAL_TABLET | Freq: Every day | ORAL | Status: DC
  Administered 2019-07-26 – 2019-07-30 (×5): 100 mg via ORAL
  Filled 2019-07-26 (×5): qty 1

## 2019-07-26 MED ORDER — TRAMADOL HCL 50 MG PO TABS: 50.0000 mg | ORAL_TABLET | Freq: Three times a day (TID) | ORAL | Status: DC | PRN

## 2019-07-26 MED ORDER — PANTOPRAZOLE SODIUM 40 MG IV SOLR
40.0000 mg | Freq: Once | INTRAVENOUS | Status: AC
  Administered 2019-07-26: 40 mg via INTRAVENOUS

## 2019-07-26 MED ORDER — HYDROMORPHONE HCL 1 MG/ML IJ SOLN: 0.5000 mg | INTRAMUSCULAR | Status: DC | PRN

## 2019-07-26 MED ORDER — HYDROXYZINE HCL 25 MG PO TABS
25.0000 mg | ORAL_TABLET | Freq: Four times a day (QID) | ORAL | Status: AC | PRN
  Administered 2019-07-26 – 2019-07-28 (×3): 25 mg via ORAL
  Filled 2019-07-26 (×3): qty 1

## 2019-07-26 MED ORDER — DICLOFENAC SODIUM 75 MG PO TBEC
75.0000 mg | DELAYED_RELEASE_TABLET | Freq: Two times a day (BID) | ORAL | Status: DC
  Administered 2019-07-27: 75 mg via ORAL
  Filled 2019-07-26: qty 1

## 2019-07-26 MED ORDER — CHLORDIAZEPOXIDE HCL 25 MG PO CAPS
25.0000 mg | ORAL_CAPSULE | Freq: Three times a day (TID) | ORAL | Status: AC
  Administered 2019-07-27 (×3): 25 mg via ORAL
  Filled 2019-07-26 (×3): qty 1

## 2019-07-26 MED ORDER — IOHEXOL 300 MG/ML  SOLN
100.0000 mL | Freq: Once | INTRAMUSCULAR | Status: AC | PRN
  Administered 2019-07-26: 100 mL via INTRAVENOUS

## 2019-07-26 MED ORDER — SODIUM CHLORIDE 0.9 % IV SOLN: INTRAVENOUS | Status: DC

## 2019-07-26 MED ORDER — LEVALBUTEROL HCL 0.63 MG/3ML IN NEBU: 0.6300 mg | INHALATION_SOLUTION | Freq: Four times a day (QID) | RESPIRATORY_TRACT | Status: DC | PRN

## 2019-07-26 MED ORDER — CHLORDIAZEPOXIDE HCL 25 MG PO CAPS
25.0000 mg | ORAL_CAPSULE | Freq: Four times a day (QID) | ORAL | Status: AC
  Administered 2019-07-26 (×3): 25 mg via ORAL
  Filled 2019-07-26 (×4): qty 1

## 2019-07-26 MED ORDER — OXYCODONE HCL 5 MG PO TABS
5.0000 mg | ORAL_TABLET | ORAL | Status: DC | PRN
  Administered 2019-07-26 – 2019-07-30 (×11): 5 mg via ORAL
  Filled 2019-07-26 (×12): qty 1

## 2019-07-26 MED ORDER — ONDANSETRON HCL 4 MG/2ML IJ SOLN: 4.0000 mg | Freq: Four times a day (QID) | INTRAMUSCULAR | Status: DC | PRN

## 2019-07-26 MED ORDER — CHLORDIAZEPOXIDE HCL 25 MG PO CAPS
25.0000 mg | ORAL_CAPSULE | Freq: Every day | ORAL | Status: AC
  Administered 2019-07-29: 25 mg via ORAL
  Filled 2019-07-26: qty 1

## 2019-07-26 MED ORDER — PREGABALIN 75 MG PO CAPS: 200.0000 mg | ORAL_CAPSULE | Freq: Two times a day (BID) | ORAL | Status: DC

## 2019-07-26 MED ORDER — HEPARIN SODIUM (PORCINE) 5000 UNIT/ML IJ SOLN
5000.0000 [IU] | Freq: Three times a day (TID) | INTRAMUSCULAR | Status: DC
  Administered 2019-07-26 – 2019-07-29 (×8): 5000 [IU] via SUBCUTANEOUS
  Filled 2019-07-26 (×9): qty 1

## 2019-07-26 MED ORDER — DOCUSATE SODIUM 100 MG PO CAPS
100.0000 mg | ORAL_CAPSULE | Freq: Two times a day (BID) | ORAL | Status: DC
  Administered 2019-07-26 – 2019-07-30 (×9): 100 mg via ORAL
  Filled 2019-07-26 (×9): qty 1

## 2019-07-26 MED ORDER — SODIUM CHLORIDE 0.9 % IV BOLUS
1000.0000 mL | Freq: Once | INTRAVENOUS | Status: AC
  Administered 2019-07-26: 1000 mL via INTRAVENOUS

## 2019-07-26 MED ORDER — SODIUM CHLORIDE 0.9% FLUSH
3.0000 mL | Freq: Two times a day (BID) | INTRAVENOUS | Status: DC
  Administered 2019-07-26 – 2019-07-30 (×6): 3 mL via INTRAVENOUS

## 2019-07-26 MED ORDER — LOPERAMIDE HCL 2 MG PO CAPS: 2.0000 mg | ORAL_CAPSULE | ORAL | Status: AC | PRN

## 2019-07-26 MED ORDER — PREGABALIN 75 MG PO CAPS
200.0000 mg | ORAL_CAPSULE | Freq: Two times a day (BID) | ORAL | Status: DC
  Administered 2019-07-26 – 2019-07-30 (×8): 200 mg via ORAL
  Filled 2019-07-26 (×8): qty 1

## 2019-07-26 MED ORDER — LACTATED RINGERS IV BOLUS
1000.0000 mL | Freq: Once | INTRAVENOUS | Status: AC
  Administered 2019-07-26: 1000 mL via INTRAVENOUS

## 2019-07-26 MED ORDER — SENNOSIDES-DOCUSATE SODIUM 8.6-50 MG PO TABS
1.0000 | ORAL_TABLET | Freq: Every evening | ORAL | Status: DC | PRN
  Filled 2019-07-26: qty 1

## 2019-07-26 MED ORDER — LEVOFLOXACIN IN D5W 750 MG/150ML IV SOLN
750.0000 mg | INTRAVENOUS | Status: DC
  Administered 2019-07-27 – 2019-07-30 (×4): 750 mg via INTRAVENOUS
  Filled 2019-07-26 (×4): qty 150

## 2019-07-26 MED ORDER — SODIUM CHLORIDE 0.9 % IV BOLUS
500.0000 mL | Freq: Once | INTRAVENOUS | Status: AC
  Administered 2019-07-26: 500 mL via INTRAVENOUS

## 2019-07-26 MED ORDER — SORBITOL 70 % SOLN
30.0000 mL | Freq: Every day | Status: DC | PRN
  Filled 2019-07-26: qty 30

## 2019-07-26 MED ORDER — CHLORDIAZEPOXIDE HCL 25 MG PO CAPS
25.0000 mg | ORAL_CAPSULE | ORAL | Status: AC
  Administered 2019-07-28: 25 mg via ORAL
  Filled 2019-07-26 (×2): qty 1

## 2019-07-26 MED ORDER — LEVOTHYROXINE SODIUM 50 MCG PO TABS
50.0000 ug | ORAL_TABLET | Freq: Every day | ORAL | Status: DC
  Administered 2019-07-26 – 2019-07-30 (×4): 50 ug via ORAL
  Filled 2019-07-26 (×5): qty 1

## 2019-07-26 MED ORDER — DILTIAZEM HCL ER COATED BEADS 120 MG PO CP24
240.0000 mg | ORAL_CAPSULE | Freq: Every day | ORAL | Status: DC
  Administered 2019-07-26 – 2019-07-29 (×4): 240 mg via ORAL
  Filled 2019-07-26 (×4): qty 2

## 2019-07-26 MED ORDER — VANCOMYCIN HCL 750 MG/150ML IV SOLN
750.0000 mg | Freq: Two times a day (BID) | INTRAVENOUS | Status: DC
  Filled 2019-07-26 (×2): qty 150

## 2019-07-26 NOTE — ED Notes (Signed)
Date and time results received: 07/26/19 0316  Test: Lactic Acid Critical Value: 2.4  Name of Provider Notified: Rancour  Orders Received? Or Actions Taken?: na

## 2019-07-26 NOTE — ED Triage Notes (Signed)
Pt arrived to er by ems with c/o abd pain, pt poor historian, states " I am in bad shape" , admits to etoh use today, when asked how much pt states " too much"

## 2019-07-26 NOTE — Procedures (Signed)
PreOperative Dx: Ascites Postoperative Dx: Ascites Procedure:   US guided paracentesis Radiologist:  Thornton Papas Anesthesia:  10 ml of1% lidocaine Specimen:  4.2 L of yellow ascitic fluid EBL:   < 1 ml Complications: None

## 2019-07-26 NOTE — ED Notes (Signed)
Pt continuing to get out of bed regardless of staff requesting him not too, pt refuses assistance to bathroom. Pt has unsteady gait.

## 2019-07-26 NOTE — Progress Notes (Signed)
Pharmacy Antibiotic Note  Earl [name] Earl [name] is a 59 y.o. male admitted on 07/26/2019 with pneumonia.  Pharmacy has been consulted for vancomycin dosing.  Plan: Vancomycin 2gm IV x 1 then 750 mg IV q12 hours F/u renal function, cultures and clinical course  Height: 5\' 10"  (177.8 cm) Weight: 124 kg (273 lb 5.9 oz) IBW/kg (Calculated) : 73  Temp (24hrs), Avg:98.3 F (36.8 C), Min:98.3 F (36.8 C), Max:98.3 F (36.8 C)  Recent Labs  Lab 07/26/19 0219 07/26/19 0222  WBC 8.8  --   CREATININE 1.34*  --   LATICACIDVEN  --  2.4*    Estimated Creatinine Clearance: 77.4 mL/min (A) (by C-G formula based on SCr of 1.34 mg/dL (H)).    Allergies  Allergen Reactions  . Amitriptyline Anaphylaxis  . Amoxicillin Anaphylaxis and Rash    Has patient had a PCN reaction causing immediate rash, facial/tongue/throat swelling, SOB or lightheadedness with hypotension: Yes Has patient had a PCN reaction causing severe rash involving mucus membranes or skin necrosis: No Has patient had a PCN reaction that required hospitalization: No Has patient had a PCN reaction occurring within the last 10 years: Yes If all of the above answers are "NO", then may proceed with Cephalosporin use.   Earl [name] [name] [Aspirin] Anaphylaxis  . Penicillin G Anaphylaxis  . Clonidine Derivatives Other (See Comments)    Severe Dry Mouth  . Hydrochlorothiazide     Caused rectal bleeding and hematuria  . Levothyroxine Nausea And Vomiting    Patient states that the 75 mcg is too strong for him.  Earl [name] [name] [Vortioxetine] Nausea And Vomiting     Thank you for allowing pharmacy to be a part of this patient's care.  Earl [name] [name] 07/26/2019 4:25 AM

## 2019-07-26 NOTE — ED Notes (Signed)
Patient transported to [facility]

## 2019-07-26 NOTE — ED Provider Notes (Signed)
[facility] Provider Note   CSN: [id number] Arrival date & time: 07/26/19  0111     History Chief Complaint  Patient presents with  . Abdominal Pain    DELVON [name] is a 59 y.o. male.  Level 5 caveat for altered mental status.  Patient appears intoxicated and is a very poor historian.  He apparently called EMS due to abdominal pain and distention.  He has a massively distended abdomen on exam.  Chart review shows history of alcohol abuse, colon cancer, gallstones, kidney stones.  He admits to drinking alcohol tonight but cannot quantify how much.  Denies any drug use.  Complains of bilateral pain and distention.  Denies any fevers, chills, nausea, vomiting.  No black or bloody stools.  No chest pain or shortness of breath.  Cannot tell me if he is ever had a paracentesis or fluid drained off his stomach.  Cannot tell me if he had any vomiting or blood in his stools.  Denies any chest pain or shortness of breath.  The history is provided by the patient and the EMS personnel. The history is limited by the condition of the patient.  Abdominal Pain      Past Medical History:  Diagnosis Date  . Alcohol abuse   . Colon cancer ([place])   . Depression   . Gall stones   . Hypertension   . Kidney stone   . Typical atrial flutter [facility])     Patient Active Problem List   Diagnosis Date Noted  . Upper abdominal pain, unspecified 03/31/2019  . Nausea without vomiting 03/31/2019  . Noncompliance by refusing service 03/02/2019  . ETOHism ([place]) 02/04/2019  . Depression, recurrent ([place]) 01/05/2019  . Morbid obesity ([place]) 10/13/2018  . Mixed hyperlipidemia 08/17/2018  . Vitamin D deficiency 08/17/2018  . Acquired hypothyroidism 08/17/2018  . Abnormal LFTs 07/29/2018  . Numbness 05/19/2017  . History of alcohol abuse 05/19/2017  . History of colon cancer 04/16/2017  . Palpitations 03/09/2017  . Atrial flutter ([place]) 03/09/2017  . Family hx of colon cancer 02/04/2016   . Alcohol use 11/30/2015  . Hematuria 11/30/2015  . Hematochezia 11/30/2015  . Family history of colon cancer 09/19/2015  . Neuropathy 08/30/2015  . Rectal bleeding 08/30/2015  . Essential hypertension 08/30/2015  . Abnormal brain MRI 08/30/2015  . Vitamin B 12 deficiency 08/30/2015    Past Surgical History:  Procedure Laterality Date  . A-FLUTTER ABLATION N/A 07/31/2016   Procedure: A-Flutter Ablation;  Surgeon: [name], MD;  Location: [place] [facility];  Service: Cardiovascular;  Laterality: N/A;  . COLONOSCOPY N/A 04/17/2016   Procedure: COLONOSCOPY;  Surgeon: [name], MD;  [facility];  Service: Endoscopy;  Laterality: N/A;  1200  . COLONOSCOPY N/A 05/07/2017   Procedure: COLONOSCOPY;  Surgeon: [name], MD;  Location: [facility];  Service: Endoscopy;  Laterality: N/A;  200  . implantable loop recorder placement  01/24/2019    Medtronic Reveal Linq model LNQ 11 implantable loop recorder (SN [id number] ) by Dr Rayann Heman for evaluation of palpitations  . MINOR CARPAL TUNNEL     right  . POLYPECTOMY  04/17/2016   Procedure: POLYPECTOMY;  Surgeon: [name], MD;  Location: [facility];  Service: Endoscopy;;  sigmoid       Family History  Problem Relation Age of Onset  . Alzheimer's disease Mother   . Heart disease Father   . Coronary artery disease Father   .  Coronary artery disease Paternal Grandfather     Social History   Tobacco Use  . Smoking status: Former Smoker    Quit date: 03/03/1979    Years since quitting: 40.4  . Smokeless tobacco: Never Used  Vaping Use  . Vaping Use: Never used  Substance Use Topics  . Alcohol use: Not Currently    Alcohol/week: 8.0 standard drinks    Types: 8 Cans of beer per week    Comment: Patient states that he stopped 1 month ago  . Drug use: No    Home Medications Prior to Admission medications   Medication Sig Start Date End Date Taking? Authorizing Provider  ALPRAZolam Duanne Moron) 1 MG  tablet Take 1 mg by mouth 2 (two) times daily as needed for anxiety.     [provider]  chlorthalidone (HYGROTON) 25 MG tablet Take 1 tablet by mouth once daily 03/14/19   Baruch Gouty, FNP  Cholecalciferol (VITAMIN D3) 1.25 MG (50000 UT) CAPS Take 1 capsule by mouth once a week. 12/16/18   [provider]  diclofenac (VOLTAREN) 75 MG EC tablet Take 1 tablet (75 mg total) by mouth 2 (two) times daily. 06/06/19   Sharion Balloon, FNP  diltiazem (CARDIZEM CD) 240 MG 24 hr capsule Take 1 capsule by mouth once daily 06/08/19   Allred, Jeneen Rinks, MD  levothyroxine (SYNTHROID) 50 MCG tablet Take 1 tablet (50 mcg total) by mouth daily. 07/04/19   Evelina Dun A, FNP  lisinopril (ZESTRIL) 20 MG tablet Take 1 tablet (20 mg total) by mouth daily. 06/06/19   Hawks, Christy A, FNP  LYRICA 200 MG capsule Take 200 mg by mouth 2 (two) times daily. 07/27/17   [provider]  Magnesium Hydroxide (MAGNESIA PO) Take 1 tablet by mouth daily.    [provider]  Omega-3 Fatty Acids (OMEGA 3 PO) Take by mouth in the morning and at bedtime.     [provider]  ondansetron (ZOFRAN) 4 MG tablet Take 1 tablet (4 mg total) by mouth every 8 (eight) hours as needed for nausea or vomiting. 07/04/19   Evelina Dun A, FNP  Potassium (POTASSIMIN PO) Take 2 tablets by mouth daily.     [provider]  Red Yeast Rice Extract (RED YEAST RICE PO) Take by mouth in the morning and at bedtime.     [provider]  zinc gluconate 50 MG tablet Take 50 mg by mouth every 30 (thirty) days. Take with vitamin d    [provider]    Allergies    Amitriptyline, Amoxicillin, Asa [aspirin], Penicillin g, Clonidine derivatives, Hydrochlorothiazide, Levothyroxine, and Trintellix [vortioxetine]  Review of Systems   Review of Systems  Unable to perform ROS: Mental status change  Gastrointestinal: Positive for abdominal pain.    Physical Exam Updated Vital Signs BP 102/65    Pulse 91   Temp 98.3 F (36.8 C) (Oral)   Resp 16   Ht 5\' 10"  (1.778 m)   Wt 124 kg   SpO2 91%   BMI 39.22 kg/m   Physical Exam Vitals and nursing note reviewed.  Constitutional:      General: He is not in acute distress.    Appearance: He is well-developed. He is obese.     Comments: Somnolent, arousable to voice, states "I forgot my name". Knows that is at the hospital  HENT:     Head: Normocephalic and atraumatic.     Mouth/Throat:     Pharynx: No oropharyngeal exudate.  Eyes:     Conjunctiva/sclera: Conjunctivae normal.     Pupils: Pupils are equal, round, and reactive to light.  Neck:     Comments: No meningismus. Cardiovascular:     Rate and Rhythm: Normal rate and regular rhythm.     Heart sounds: Normal heart sounds. No murmur heard.   Pulmonary:     Effort: Pulmonary effort is normal. No respiratory distress.     Breath sounds: Normal breath sounds.  Abdominal:     General: There is distension.     Palpations: Abdomen is soft.     Tenderness: There is abdominal tenderness. There is no guarding or rebound.     Comments: Abdomen is very distended, mild diffuse tenderness, no guarding or rebound  Musculoskeletal:        General: No tenderness. Normal range of motion.     Cervical back: Normal range of motion and neck supple.  Skin:    General: Skin is warm.  Neurological:     Mental Status: He is alert.     Motor: No abnormal muscle tone.     Coordination: Coordination normal.     Comments: Oriented to place, not person.  Moves all extremities and follows commands.  5 out of 5 strength throughout.  Psychiatric:        Behavior: Behavior normal.     ED Results / Procedures / Treatments   Labs (all labs ordered are listed, but only abnormal results are displayed) Labs Reviewed  CBC WITH DIFFERENTIAL/PLATELET - Abnormal; Notable for the following components:      Result Value   MCV 106.0 (*)    All other components within normal limits  COMPREHENSIVE  METABOLIC PANEL - Abnormal; Notable for the following components:   Glucose, Bld 102 (*)    Creatinine, Ser 1.34 (*)    Calcium 8.0 (*)    Total Protein 6.4 (*)    Albumin 2.8 (*)    AST 77 (*)    Alkaline Phosphatase 166 (*)    GFR calc non Af Amer 57 (*)    All other components within normal limits  ETHANOL - Abnormal; Notable for the following components:   Alcohol, Ethyl (B) 258 (*)    All other components within normal limits  AMMONIA - Abnormal; Notable for the following components:   Ammonia 42 (*)    All other components within normal limits  LACTIC ACID, PLASMA - Abnormal; Notable for the following components:   Lactic Acid, Venous 2.4 (*)    All other components within normal limits  LACTIC ACID, PLASMA - Abnormal; Notable for the following components:   Lactic Acid, Venous 2.6 (*)    All other components within normal limits  CULTURE, BLOOD (ROUTINE X 2)  CULTURE, BLOOD (ROUTINE X 2)  SARS CORONAVIRUS 2 BY RT PCR (HOSPITAL ORDER, [facility])  LIPASE, BLOOD  PROTIME-INR  BRAIN NATRIURETIC PEPTIDE  URINALYSIS, ROUTINE W REFLEX MICROSCOPIC  POC OCCULT BLOOD, ED  TROPONIN I (HIGH SENSITIVITY)  TROPONIN I (HIGH SENSITIVITY)    EKG EKG Interpretation  Date/Time:  Tuesday July 26 2019 02:31:03 EDT Ventricular Rate:  91 PR Interval:    QRS Duration: 103 QT Interval:  385 QTC Calculation: 474 R Axis:   62 Text Interpretation: Sinus rhythm Low voltage, precordial leads Borderline T abnormalities, anterior leads No significant change was found Confirmed by Ezequiel Essex [phone]) on 07/26/2019 2:34:25 AM   Radiology CT Head Wo Contrast  Result Date: 07/26/2019 CLINICAL  DATA:  Encephalopathy.  Alcohol abuse today. EXAM: CT HEAD WITHOUT CONTRAST TECHNIQUE: Contiguous axial images were obtained from the base of the skull through the vertex without intravenous contrast. COMPARISON:  Brain MRI 05/27/2017 FINDINGS: Brain: No evidence of acute  infarction, hemorrhage, hydrocephalus, extra-axial collection or mass lesion/mass effect. Vascular: No hyperdense vessel or unexpected calcification. Skull: Normal. Negative for fracture or focal lesion. Sinuses/Orbits: No acute finding. IMPRESSION: Negative head CT. Electronically Signed   By: Monte Fantasia M.D.   On: 07/26/2019 05:15   CT ABDOMEN PELVIS W CONTRAST  Result Date: 07/26/2019 CLINICAL DATA:  Abdominal distension and abdominal pain. EXAM: CT ABDOMEN AND PELVIS WITH CONTRAST TECHNIQUE: Multidetector CT imaging of the abdomen and pelvis was performed using the standard protocol following bolus administration of intravenous contrast. CONTRAST:  12mL OMNIPAQUE IOHEXOL 300 MG/ML  SOLN COMPARISON:  CT scan 04/21/2019 FINDINGS: Lower chest: Mild eventration of the right hemidiaphragm with overlying right lower lobe atelectasis. No pleural effusion or worrisome pulmonary lesions. The heart is normal in size. No pericardial effusion. Paraesophageal varices are suspected. Hepatobiliary: Cirrhotic changes involving the liver with an irregular liver contour, prominent hepatic fissures, increased caudate to right lobe ratio, portal venous hypertension with portal venous collaterals and large volume abdominal/pelvic ascites. No focal hepatic lesions are identified. The gallbladder is mildly contracted. No common bile duct dilatation. Pancreas: No mass, inflammation or ductal dilatation. Spleen: Mild splenomegaly. The spleen measures 14.5 x 12.0 x 9.4 cm. Adrenals/Urinary Tract: Adrenal glands and kidneys are unremarkable except for a stable right renal calculus. No worrisome renal lesions or hydronephrosis. Stomach/Bowel: The stomach, duodenum, small bowel and colon are grossly normal. No acute inflammatory changes, mass lesions or obstructive findings. Vascular/Lymphatic: Scattered atherosclerotic calcifications involving the aorta but no aneurysm or dissection. The branch vessels are patent. The major  venous structures are patent. The portal and splenic veins are patent. Scattered borderline periportal lymph nodes, typical with cirrhosis. No mass or overt adenopathy. Small scattered retroperitoneal lymph nodes are stable. Reproductive: The prostate gland and seminal vesicles are unremarkable. Other: No pelvic mass or pelvic adenopathy. Small scattered lymph nodes are stable. Musculoskeletal: No significant bony findings. IMPRESSION: 1. 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 2. No focal hepatic lesions. 3. Stable right renal calculus. 4. Aortic atherosclerosis. Aortic Atherosclerosis (ICD10-I70.0). Electronically Signed   By: Marijo Sanes M.D.   On: 07/26/2019 05:17   DG Chest Portable 1 View  Result Date: 07/26/2019 CLINICAL DATA:  Short of breath, intoxicated EXAM: PORTABLE CHEST 1 VIEW COMPARISON:  01/07/2019 FINDINGS: Single frontal view of the chest demonstrates an unremarkable cardiac silhouette. Loop recorder is identified overlying cardiac apex. There is right basilar consolidation and likely small right effusion, which may reflect pneumonia or aspiration. Left chest is clear. No pneumothorax. IMPRESSION: 1. Right basilar consolidation and effusion, compatible with infection or aspiration. Electronically Signed   By: Randa Ngo M.D.   On: 07/26/2019 02:58    Procedures .Critical Care Performed by: Ezequiel Essex, MD Authorized by: Ezequiel Essex, MD   Critical care provider statement:    Critical care time (minutes):  45   Critical care was necessary to treat or prevent imminent or life-threatening deterioration of the following conditions:  Respiratory failure   Critical care was time spent personally by me on the following activities:  Discussions with consultants, evaluation of patient's response to treatment, examination of patient, ordering and performing treatments and  interventions, ordering and  review of laboratory studies, ordering and review of radiographic studies, pulse oximetry, re-evaluation of patient's condition, obtaining history from patient or surrogate and review of old charts   (including critical care time)  Medications Ordered in ED Medications  sodium chloride 0.9 % bolus 500 mL (has no administration in time range)  pantoprazole (PROTONIX) injection 40 mg (has no administration in time range)    ED Course  I have reviewed the triage vital signs and the nursing notes.  Pertinent labs & imaging results that were available during my care of the patient were reviewed by me and considered in my medical decision making (see chart for details).    MDM Rules/Calculators/A&P                         Patient was apparently intoxicated here with abdominal distention and alcohol intoxication. Does appear to have large volume ascites. He is afebrile.  Labs show alcohol intoxication.  Mild AKI.  LFTs mildly elevated.  Ammonia 48.  X-ray concerning for possible right-sided pneumonia. Broad-spectrum antibiotics started after cultures are obtained. Suspect aspiration.  CT scan shows cirrhosis with ascites.  Low suspicion for SBP.  Abdomen not painful.  No fever.  CT head is negative.  Patient remains intoxicated but is starting to become more clear with his mentation.  He will need admission for treatment of his pneumonia.  Will also benefit from paracentesis while he is hospitalized.  He has no respiratory distress.  Oxygenation is stable on nasal cannula.  Admission discussed with Dr. Darrick Meigs.  Earl [name] was evaluated in Emergency Department on 07/26/2019 for the symptoms described in the history of present illness. He was evaluated in the context of the global COVID-19 pandemic, which necessitated consideration that the patient might be at risk for infection with the SARS-CoV-2 virus that causes COVID-19. Institutional protocols and  algorithms that pertain to the evaluation of patients at risk for COVID-19 are in a state of rapid change based on information released by regulatory bodies including the CDC and federal and state organizations. These policies and algorithms were followed during the patient's care in the ED.  Final Clinical Impression(s) / ED Diagnoses Final diagnoses:  Community acquired pneumonia of right lower lobe of lung  Alcoholic intoxication without complication ([place])  Other ascites    Rx / DC Orders ED Discharge Orders    None       Darnesha Diloreto, Annie Main, MD 07/26/19 [phone]

## 2019-07-26 NOTE — ED Notes (Signed)
Pt refused occult blood collection

## 2019-07-26 NOTE — Progress Notes (Signed)
Paracentesis complete no signs of distress.  

## 2019-07-26 NOTE — ED Notes (Signed)
Date and time results received: 07/26/19 0457  Test: Lactic Acid  Critical Value: 2.6  Name of Provider Notified: Rancour  Orders Received? Or Actions Taken?: na

## 2019-07-26 NOTE — H&P (Signed)
History and Physical   Patient: Earl [name]                            PCP: Sharion Balloon, FNP                    DOB: [birth year]            DOA: 07/26/2019 KNL:[id number]             DOS: 07/26/2019, 9:11 AM  Patient coming from:   HOME  I have personally reviewed patient's medical records, in electronic medical records, including:  [place] [facility], and care everywhere.    Chief Complaint:   Chief Complaint  Patient presents with  . Abdominal Pain    History of present illness:   Earl [name] is a 59 year old male with extensive history of alcohol abuse, h/o colon cancer, depression, hypertension, obesity, vitamin D deficiency, hypothyroidism, presented to the ED with chief complaint abdominal pain, abdominal distention, intoxicated with alcohol.  Patient reports has been drinking since he been depressed as his dad recently passed away.  But seems like he has extensive history of alcohol use and abuse.  Patient Denies having: Fever, Chills, Cough, SOB, Chest Pain, headache, dizziness, lightheadedness,  Dysuria, Joint pain, rash, open wounds  ED Course:   Patient has been evaluated in ED, hemodynamically stable with temp of 99.2, blood pressure on arrival 100/56, currently 110/74, was delirious on arrival. CBC CMP were reviewed, lactic acid elevated 2.4, 2.6, 2.4 again.  AST 77, ALT 29, alk phos 166, creatinine 1.34, UA chest x-ray within normal notes, alcohol level 258 (normal range less than 10) TSH 4.74, CT abdomen has been reviewed: Revealing liver cirrhosis, large ascites, atherosclerotic changes Chest x-ray revealing right upper lobe infiltrate possible aspiration pneumonia Blood cultures were obtained, initiated antibiotics. Physical exam on recall markable with exception of significantly distended abdomen mild diffuse tenderness with a fluid shift.    Review of Systems: As per HPI, otherwise 10 point review of systems were negative.    ----------------------------------------------------------------------------------------------------------------------  Allergies  Allergen Reactions  . Amitriptyline Anaphylaxis  . Amoxicillin Anaphylaxis and Rash    Has patient had a PCN reaction causing immediate rash, facial/tongue/throat swelling, SOB or lightheadedness with hypotension: Yes Has patient had a PCN reaction causing severe rash involving mucus membranes or skin necrosis: No Has patient had a PCN reaction that required hospitalization: No Has patient had a PCN reaction occurring within the last 10 years: Yes If all of the above answers are "NO", then may proceed with Cephalosporin use.   Diona Fanti [Aspirin] Anaphylaxis  . Penicillin G Anaphylaxis  . Clonidine Derivatives Other (See Comments)    Severe Dry Mouth  . Hydrochlorothiazide     Caused rectal bleeding and hematuria  . Levothyroxine Nausea And Vomiting    Patient states that the 75 mcg is too strong for him.  Rennis Harding [Vortioxetine] Nausea And Vomiting    Home MEDs:  Prior to Admission medications   Medication Sig Start Date End Date Taking? Authorizing Provider  ALPRAZolam Duanne Moron) 1 MG tablet Take 1 mg by mouth 2 (two) times daily as needed for anxiety.     [provider]  chlorthalidone (HYGROTON) 25 MG tablet Take 1 tablet by mouth once daily 03/14/19   Baruch Gouty, FNP  Cholecalciferol (VITAMIN D3) 1.25 MG (50000 UT) CAPS Take 1 capsule by mouth once a week. 12/16/18  [provider]  diclofenac (VOLTAREN) 75 MG EC tablet Take 1 tablet (75 mg total) by mouth 2 (two) times daily. 06/06/19   Sharion Balloon, FNP  diltiazem (CARDIZEM CD) 240 MG 24 hr capsule Take 1 capsule by mouth once daily 06/08/19   Allred, Jeneen Rinks, MD  levothyroxine (SYNTHROID) 50 MCG tablet Take 1 tablet (50 mcg total) by mouth daily. 07/04/19   Evelina Dun A, FNP  lisinopril (ZESTRIL) 20 MG tablet Take 1 tablet (20 mg total) by mouth daily. 06/06/19   Hawks,  Christy A, FNP  LYRICA 200 MG capsule Take 200 mg by mouth 2 (two) times daily. 07/27/17   [provider]  Magnesium Hydroxide (MAGNESIA PO) Take 1 tablet by mouth daily.    [provider]  Omega-3 Fatty Acids (OMEGA 3 PO) Take by mouth in the morning and at bedtime.     [provider]  ondansetron (ZOFRAN) 4 MG tablet Take 1 tablet (4 mg total) by mouth every 8 (eight) hours as needed for nausea or vomiting. 07/04/19   Evelina Dun A, FNP  Potassium (POTASSIMIN PO) Take 2 tablets by mouth daily.     [provider]  Red Yeast Rice Extract (RED YEAST RICE PO) Take by mouth in the morning and at bedtime.     [provider]  zinc gluconate 50 MG tablet Take 50 mg by mouth every 30 (thirty) days. Take with vitamin d    [provider]    PRN MEDs: albuterol, chlordiazePOXIDE, HYDROmorphone (DILAUDID) injection, hydrOXYzine, levalbuterol, loperamide, ondansetron **OR** ondansetron (ZOFRAN) IV, oxyCODONE, senna-docusate, sorbitol, traMADol  Past Medical History:  Diagnosis Date  . Alcohol abuse   . Colon cancer ([place])   . Depression   . Gall stones   . Hypertension   . Kidney stone   . Typical atrial flutter [facility])     Past Surgical History:  Procedure Laterality Date  . A-FLUTTER ABLATION N/A 07/31/2016   Procedure: A-Flutter Ablation;  Surgeon: [name], MD;  Location: [place] [facility];  Service: Cardiovascular;  Laterality: N/A;  . COLONOSCOPY N/A 04/17/2016   Procedure: COLONOSCOPY;  Surgeon: [name], MD;  [facility];  Service: Endoscopy;  Laterality: N/A;  1200  . COLONOSCOPY N/A 05/07/2017   Procedure: COLONOSCOPY;  Surgeon: [name], MD;  Location[facility];  Service: Endoscopy;  Laterality: N/A;  200  . implantable loop recorder placement  01/24/2019    Medtronic Reveal Linq model LNQ 11 implantable loop recorder (SN [id number] ) by Dr Rayann Heman for evaluation of palpitations  . MINOR CARPAL  TUNNEL     right  . POLYPECTOMY  04/17/2016   Procedure: POLYPECTOMY;  Surgeon: [name], MD;  Location: [facility];  Service: Endoscopy;;  sigmoid     reports that he quit smoking about 40 years ago. He has never used smokeless tobacco. He reports previous alcohol use of about 8.0 standard drinks of alcohol per week. He reports that he does not use drugs.   Family History  Problem Relation Age of Onset  . Alzheimer's disease Mother   . Heart disease Father   . Coronary artery disease Father   . Coronary artery disease Paternal Grandfather     Physical Exam:   Vitals:   07/26/19 0320 07/26/19 0400 07/26/19 0504 07/26/19 0737  BP: (!) 100/59 122/73 (!) 100/56 110/74  Pulse:    87  Resp: 20  19 20   Temp:  99.2 F (37.3 C)  TempSrc:    Oral  SpO2:    95%  Weight:      Height:       Constitutional: NAD, calm, comfortable Eyes: PERRL, lids and conjunctivae normal ENMT: Mucous membranes are moist. Posterior pharynx clear of any exudate or lesions.Normal dentition.  Neck: normal, supple, no masses, no thyromegaly Respiratory: clear to auscultation bilaterally, no wheezing, no crackles. Normal respiratory effort. No accessory muscle use.  Cardiovascular: Regular rate and rhythm, no murmurs / rubs / gallops. No extremity edema. 2+ pedal pulses. No carotid bruits.  Abdomen: Diffuse tenderness, extensively distended, positive fluid shift, positive for  hepatosplenomegaly. Bowel sounds positive.  Musculoskeletal: no clubbing / cyanosis. No joint deformity upper and lower extremities. Good ROM, no contractures. Normal muscle tone.  Neurologic: CN II-XII grossly intact. Sensation intact, DTR normal. Strength 5/5 in all 4.  Psychiatric: Normal judgment and insight. Alert and oriented x 3. Normal mood.  Skin: no rashes, lesions, ulcers. No induration Wounds: per nursing documentation    Labs on admission:    I have personally reviewed following labs and imaging  studies  CBC: Recent Labs  Lab 07/26/19 0219  WBC 8.8  NEUTROABS 4.1  HGB 14.6  HCT 45.8  MCV 106.0*  PLT 962   Basic Metabolic Panel: Recent Labs  Lab 07/26/19 0219 07/26/19 0800  NA 137  --   K 4.1  --   CL 104  --   CO2 23  --   GLUCOSE 102*  --   BUN 10  --   CREATININE 1.34*  --   CALCIUM 8.0* 7.6*  MG  --  2.0  PHOS  --  2.7   GFR: Estimated Creatinine Clearance: 77.4 mL/min (A) (by C-G formula based on SCr of 1.34 mg/dL (H)). Liver Function Tests: Recent Labs  Lab 07/26/19 0219  AST 77*  ALT 29  ALKPHOS 166*  BILITOT 1.1  PROT 6.4*  ALBUMIN 2.8*   Recent Labs  Lab 07/26/19 0219  LIPASE 25   Recent Labs  Lab 07/26/19 0219  AMMONIA 42*   Coagulation Profile: Recent Labs  Lab 07/26/19 0219  INR 1.2  Urine analysis:    Component Value Date/Time   COLORURINE YELLOW 07/26/2019 0219   APPEARANCEUR CLEAR 07/26/2019 0219   APPEARANCEUR Clear 06/06/2019 1214   LABSPEC 1.010 07/26/2019 0219   PHURINE 5.0 07/26/2019 0219   GLUCOSEU NEGATIVE 07/26/2019 0219   HGBUR NEGATIVE 07/26/2019 0219   BILIRUBINUR NEGATIVE 07/26/2019 0219   BILIRUBINUR Negative 06/06/2019 [place] 07/26/2019 0219   PROTEINUR NEGATIVE 07/26/2019 0219   UROBILINOGEN 1.0 10/19/2008 1257   NITRITE NEGATIVE 07/26/2019 0219   LEUKOCYTESUR NEGATIVE 07/26/2019 0219     Radiologic Exams on Admission:   CT Head Wo Contrast  Result Date: 07/26/2019 CLINICAL DATA:  Encephalopathy.  Alcohol abuse today. EXAM: CT HEAD WITHOUT CONTRAST TECHNIQUE: Contiguous axial images were obtained from the base of the skull through the vertex without intravenous contrast. COMPARISON:  Brain MRI 05/27/2017 FINDINGS: Brain: No evidence of acute infarction, hemorrhage, hydrocephalus, extra-axial collection or mass lesion/mass effect. Vascular: No hyperdense vessel or unexpected calcification. Skull: Normal. Negative for fracture or focal lesion. Sinuses/Orbits: No acute finding.  IMPRESSION: Negative head CT. Electronically Signed   By: Monte Fantasia M.D.   On: 07/26/2019 05:15   CT ABDOMEN PELVIS W CONTRAST  Result Date: 07/26/2019 CLINICAL DATA:  Abdominal distension and abdominal pain. EXAM: CT ABDOMEN AND PELVIS WITH CONTRAST TECHNIQUE: Multidetector  CT imaging of the abdomen and pelvis was performed using the standard protocol following bolus administration of intravenous contrast. CONTRAST:  136m OMNIPAQUE IOHEXOL 300 MG/ML  SOLN COMPARISON:  CT scan 04/21/2019 FINDINGS: Lower chest: Mild eventration of the right hemidiaphragm with overlying right lower lobe atelectasis. No pleural effusion or worrisome pulmonary lesions. The heart is normal in size. No pericardial effusion. Paraesophageal varices are suspected. Hepatobiliary: Cirrhotic changes involving the liver with an irregular liver contour, prominent hepatic fissures, increased caudate to right lobe ratio, portal venous hypertension with portal venous collaterals and large volume abdominal/pelvic ascites. No focal hepatic lesions are identified. The gallbladder is mildly contracted. No common bile duct dilatation. Pancreas: No mass, inflammation or ductal dilatation. Spleen: Mild splenomegaly. The spleen measures 14.5 x 12.0 x 9.4 cm. Adrenals/Urinary Tract: Adrenal glands and kidneys are unremarkable except for a stable right renal calculus. No worrisome renal lesions or hydronephrosis. Stomach/Bowel: The stomach, duodenum, small bowel and colon are grossly normal. No acute inflammatory changes, mass lesions or obstructive findings. Vascular/Lymphatic: Scattered atherosclerotic calcifications involving the aorta but no aneurysm or dissection. The branch vessels are patent. The major venous structures are patent. The portal and splenic veins are patent. Scattered borderline periportal lymph nodes, typical with cirrhosis. No mass or overt adenopathy. Small scattered retroperitoneal lymph nodes are stable. Reproductive: The  prostate gland and seminal vesicles are unremarkable. Other: No pelvic mass or pelvic adenopathy. Small scattered lymph nodes are stable. Musculoskeletal: No significant bony findings. IMPRESSION: 1. 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 2. No focal hepatic lesions. 3. Stable right renal calculus. 4. Aortic atherosclerosis. Aortic Atherosclerosis (ICD10-I70.0). Electronically Signed   By: PMarijo SanesM.D.   On: 07/26/2019 05:17   DG Chest Portable 1 View  Result Date: 07/26/2019 CLINICAL DATA:  Short of breath, intoxicated EXAM: PORTABLE CHEST 1 VIEW COMPARISON:  01/07/2019 FINDINGS: Single frontal view of the chest demonstrates an unremarkable cardiac silhouette. Loop recorder is identified overlying cardiac apex. There is right basilar consolidation and likely small right effusion, which may reflect pneumonia or aspiration. Left chest is clear. No pneumothorax. IMPRESSION: 1. Right basilar consolidation and effusion, compatible with infection or aspiration. Electronically Signed   By: MRanda NgoM.D.   On: 07/26/2019 02:58    EKG:   Independently reviewed.  Orders placed or performed during the hospital encounter of 07/26/19  . EKG 12-Lead  . EKG 12-Lead  . EKG 12-Lead  . EKG 12-Lead  . EKG 12-Lead   ---------------------------------------------------------------------------------------------------------------------------------------    Assessment / Plan:   Active Problems:   Pneumonia   PNA (pneumonia)  Pneumonia Likely aspiration pneumonia -Accidental due to ETOH intoxication -Blood cultures were obtained in ED -Initiated antibiotics -IV Levaquin   Abdominal pain/distention/liver cirrhosis/large ascites -CT abdomen pelvis reviewed; revealing atherosclerotic changes, liver cirrhosis, large fluid collection consistent with ascites -Liver cirrhosis, ascites likely  alcohol induced-prolonged alcohol use and abuse -As needed analgesics -IR consulted for ultrasound-guided paracentesis -Peritoneal fluid analysis and cultures been ordered  Lactic acidosis -Likely metabolic,  ruling out sepsis -Likely due to dehydration, alcohol intoxication, pneumonia -Currently afebrile, mildly hypertensive, no leukocytosis - were reviewed, lactic acid elevated 2.4, 2.6, 2.4 again.  - AST 77, ALT 29, alk phos 166, creatinine 1.34, UA chest x-ray within normal notes  Alcohol use/abuse/intoxicated -Patient will be kept n.p.o. -Banana bag, fluid resuscitation -CIWA protocol with Librium taper -Patient is advised extensively regarding cessation of alcohol use and abuse -Current  expressed understanding agreement with detox plan    ?  History of dysrhythmia -Currently normal sinus rhythm -Patient is very poor historian 9 chronic anticoagulation, currently on Cardizem -Medication will be continued  Hypertension -Stable continue home medication including lisinopril  History of anxiety/depression -Currently on as needed Xanax and Lyrica -Above medication will be held as patient can be on CIWA protocol with Librium    Cultures:  -6/29 Blood cultures x2 >> Antimicrobial: -6/29 IV antibiotic Levaquin >>  Consults called:  GI -------------------------------------------------------------------------------------------------------------------------------------------- DVT prophylaxis: SCD/Compression stockings and Heparin SQ Code Status:   Code Status: Full Code   Admission status: Patient will be admitted as Inpatient, with a greater than 2 midnight length of stay.   Family Communication:  none at bedside  (The above findings and plan of care has been discussed with patient in detail, the patient expressed understanding and agreement of above plan)   --------------------------------------------------------------------------------------------------------------------------------------------------  Disposition Plan: >3 days Status is: Inpatient  Remains inpatient appropriate because:Inpatient level of care appropriate due to severity of illness   Dispo: The patient is from: Home              Anticipated d/c is to: Home              Anticipated d/c date is: > 3 days              Patient currently is not medically stable to d/c.   ----------------------------------------------------------------------------------------------------------------------------------------------------  Time spent: > than  75  Min.   SIGNED: Deatra James, MD, FACP, FHM. [facility],  Pager (Please use [email or web address] to page to text)  If 7PM-7AM, please contact night-coverage [email or web address] 07/26/2019, 9:11 AM

## 2019-07-27 ENCOUNTER — Inpatient Hospital Stay (HOSPITAL_COMMUNITY): Payer: Medicaid Other

## 2019-07-27 ENCOUNTER — Ambulatory Visit (INDEPENDENT_AMBULATORY_CARE_PROVIDER_SITE_OTHER): Payer: Medicaid Other | Admitting: Internal Medicine

## 2019-07-27 DIAGNOSIS — R188 Other ascites: Secondary | ICD-10-CM

## 2019-07-27 LAB — PROTIME-INR
INR: 1.3 — ABNORMAL HIGH (ref 0.8–1.2)
Prothrombin Time: 15.7 seconds — ABNORMAL HIGH (ref 11.4–15.2)

## 2019-07-27 LAB — PH, BODY FLUID: pH, Body Fluid: 7.5

## 2019-07-27 LAB — CBC
HCT: 45.3 % (ref 39.0–52.0)
Hemoglobin: 14.3 g/dL (ref 13.0–17.0)
MCH: 33.5 pg (ref 26.0–34.0)
MCHC: 31.6 g/dL (ref 30.0–36.0)
MCV: 106.1 fL — ABNORMAL HIGH (ref 80.0–100.0)
Platelets: 167 10*3/uL (ref 150–400)
RBC: 4.27 MIL/uL (ref 4.22–5.81)
RDW: 13.2 % (ref 11.5–15.5)
WBC: 6.8 10*3/uL (ref 4.0–10.5)
nRBC: 0 % (ref 0.0–0.2)

## 2019-07-27 LAB — COMPREHENSIVE METABOLIC PANEL
ALT: 24 U/L (ref 0–44)
AST: 59 U/L — ABNORMAL HIGH (ref 15–41)
Albumin: 2.5 g/dL — ABNORMAL LOW (ref 3.5–5.0)
Alkaline Phosphatase: 149 U/L — ABNORMAL HIGH (ref 38–126)
Anion gap: 9 (ref 5–15)
BUN: 7 mg/dL (ref 6–20)
CO2: 26 mmol/L (ref 22–32)
Calcium: 7.8 mg/dL — ABNORMAL LOW (ref 8.9–10.3)
Chloride: 104 mmol/L (ref 98–111)
Creatinine, Ser: 0.85 mg/dL (ref 0.61–1.24)
GFR calc Af Amer: >60 mL/min (ref >60)
GFR calc non Af Amer: >60 mL/min (ref >60)
Glucose, Bld: 86 mg/dL (ref 70–99)
Potassium: 4.1 mmol/L (ref 3.5–5.1)
Sodium: 139 mmol/L (ref 135–145)
Total Bilirubin: 1.4 mg/dL — ABNORMAL HIGH (ref 0.3–1.2)
Total Protein: 6 g/dL — ABNORMAL LOW (ref 6.5–8.1)

## 2019-07-27 LAB — APTT: aPTT: 38 seconds — ABNORMAL HIGH (ref 24–36)

## 2019-07-27 LAB — HEMOGLOBIN A1C
Hgb A1c MFr Bld: 4.5 % — ABNORMAL LOW (ref 4.8–5.6)
Mean Plasma Glucose: 82 mg/dL

## 2019-07-27 LAB — PROTEIN, BODY FLUID (OTHER): Total Protein, Body Fluid Other: 2.1 g/dL

## 2019-07-27 LAB — CYTOLOGY - NON PAP

## 2019-07-27 LAB — GLUCOSE, CAPILLARY: Glucose-Capillary: 79 mg/dL (ref 70–99)

## 2019-07-27 MED ORDER — FUROSEMIDE 20 MG PO TABS
20.0000 mg | ORAL_TABLET | Freq: Every day | ORAL | Status: DC
  Administered 2019-07-27: 20 mg via ORAL
  Filled 2019-07-27: qty 1

## 2019-07-27 MED ORDER — FUROSEMIDE 40 MG PO TABS
40.0000 mg | ORAL_TABLET | Freq: Every day | ORAL | Status: DC
  Administered 2019-07-27 – 2019-07-29 (×3): 40 mg via ORAL
  Filled 2019-07-27 (×3): qty 1

## 2019-07-27 MED ORDER — SPIRONOLACTONE 25 MG PO TABS
100.0000 mg | ORAL_TABLET | Freq: Every day | ORAL | Status: DC
  Administered 2019-07-27 – 2019-07-29 (×3): 100 mg via ORAL
  Filled 2019-07-27 (×3): qty 4

## 2019-07-27 MED ORDER — DICLOFENAC SODIUM 1 % EX GEL
2.0000 g | Freq: Four times a day (QID) | CUTANEOUS | Status: DC
  Administered 2019-07-27 – 2019-07-30 (×8): 2 g via TOPICAL
  Filled 2019-07-27: qty 100

## 2019-07-27 MED ORDER — SPIRONOLACTONE 25 MG PO TABS
50.0000 mg | ORAL_TABLET | Freq: Every day | ORAL | Status: DC
  Administered 2019-07-27: 50 mg via ORAL
  Filled 2019-07-27: qty 2

## 2019-07-27 NOTE — Progress Notes (Addendum)
   07/27/19 1930  Assess: MEWS Score  ECG Heart Rate 88  Assess: if the MEWS score is Yellow or Red  Were vital signs taken at a resting state? No  Focused Assessment Documented focused assessment  Early Detection of Sepsis Score *See Row Information* Low  MEWS guidelines implemented *See Row Information* No, vital signs rechecked  Treat  MEWS Interventions Other (Comment) (Patient rested)  Document  Patient Outcome Stabilized after interventions  Progress note created (see row info) Yes   Patient was walking the hallway, history of atrial flutter. Once notified of his heart rate he rested and his HR dropped to WNL. Day and Night shift RN's were aware, visualized patient.

## 2019-07-27 NOTE — Plan of Care (Signed)

## 2019-07-27 NOTE — Consult Note (Signed)
GI Inpatient Consult Note  Reason for Consult: Ascites/cirrhosis    Attending Requesting Consult: Dr. Roger Shelter  History of Present Illness: Earl [name] is a 59 y.o. male seen for evaluation of ascites/cirrhosis.  Patient was last seen in our office by Dr. Laural Golden in March 2021 for fatty liver disease.  He had a CT at that time without evidence of cirrhosis.  He reports feeling in his normal state of health until approximately month ago when he developed bloating, this progressively worsened to the point he was unable to bend over to tie shoe, had a poor appetite, etc.  He notes about a month ago trying a new depression medication which caused some nausea vomiting, this improved when he stopped the Trintellix.  He has chronic constipation and uses Linzess 72 mcg as needed, a full dose causes diarrhea and he breaks these into thirds which seems to work well.  He denies any rectal bleeding or melena. No other GERD, dysphagia, n/v.  He reports feeling much less distended after his paracentesis yesterday.  He currently this morning denies any abdominal pain, nausea vomiting.  He is tolerating a regular diet.   He reports his father passed away from liver failure in August 2020, he had had drank heavily in the past but had stopped before father passed away.  After father passed he became depressed and started drinking 1-2 fifths a day over the past several months.  He denies tobacco intake.  No NSAIDs except diclofenac once a day.  History of invasive carcinoma and a tubular adenoma which was removed in March 2018.  He did not undergo segmental resection.  He has been followed by serial CEAs have always been normal.  Last surveillance colonoscopy was April 2019 with removal of 2 small polyps and these were tubular adenomas.  Patient is due for surveillance colonoscopy April 2022.     Past Medical History:  Past Medical History:  Diagnosis Date  . Alcohol abuse   . Colon cancer ([place])   .  Depression   . Gall stones   . Hypertension   . Kidney stone   . Typical atrial flutter ([place])     Problem List: Patient Active Problem List   Diagnosis Date Noted  . Pneumonia 07/26/2019  . PNA (pneumonia) 07/26/2019  . Lactic acidosis 07/26/2019  . Alcoholic hepatitis with ascites 07/26/2019  . Upper abdominal pain, unspecified 03/31/2019  . Nausea without vomiting 03/31/2019  . Noncompliance by refusing service 03/02/2019  . ETOHism ([place]) 02/04/2019  . Depression, recurrent ([place]) 01/05/2019  . Morbid obesity ([place]) 10/13/2018  . Mixed hyperlipidemia 08/17/2018  . Vitamin D deficiency 08/17/2018  . Acquired hypothyroidism 08/17/2018  . Abnormal LFTs 07/29/2018  . Numbness 05/19/2017  . History of alcohol abuse 05/19/2017  . History of colon cancer 04/16/2017  . Palpitations 03/09/2017  . Atrial flutter ([place]) 03/09/2017  . Family hx of colon cancer 02/04/2016  . Alcohol use 11/30/2015  . Hematuria 11/30/2015  . Hematochezia 11/30/2015  . Family history of colon cancer 09/19/2015  . Neuropathy 08/30/2015  . Rectal bleeding 08/30/2015  . Essential hypertension 08/30/2015  . Abnormal brain MRI 08/30/2015  . Vitamin B 12 deficiency 08/30/2015    Past Surgical History: Past Surgical History:  Procedure Laterality Date  . A-FLUTTER ABLATION N/A 07/31/2016   Procedure: A-Flutter Ablation;  Surgeon: [name], MD;  Location: [facility];  Service: Cardiovascular;  Laterality: N/A;  . COLONOSCOPY N/A 04/17/2016   Procedure: COLONOSCOPY;  Surgeon:  [name], MD;  [facility];  Service: Endoscopy;  Laterality: N/A;  1200  . COLONOSCOPY N/A 05/07/2017   Procedure: COLONOSCOPY;  Surgeon: [name], MD;  [facility];  Service: Endoscopy;  Laterality: N/A;  200  . implantable loop recorder placement  01/24/2019    Medtronic Reveal Linq model LNQ 11 implantable loop recorder (SN [id number] ) by Dr Rayann Heman for evaluation of palpitations  .  MINOR CARPAL TUNNEL     right  . POLYPECTOMY  04/17/2016   Procedure: POLYPECTOMY;  Surgeon: [name], MD;  [facility];  Service: Endoscopy;;  sigmoid    Allergies: Allergies  Allergen Reactions  . Amitriptyline Anaphylaxis  . Amoxicillin Anaphylaxis and Rash    Has patient had a PCN reaction causing immediate rash, facial/tongue/throat swelling, SOB or lightheadedness with hypotension: Yes Has patient had a PCN reaction causing severe rash involving mucus membranes or skin necrosis: No Has patient had a PCN reaction that required hospitalization: No Has patient had a PCN reaction occurring within the last 10 years: Yes If all of the above answers are "NO", then may proceed with Cephalosporin use.   Diona Fanti [Aspirin] Anaphylaxis  . Penicillin G Anaphylaxis  . Clonidine Derivatives Other (See Comments)    Severe Dry Mouth  . Hydrochlorothiazide     Caused rectal bleeding and hematuria  . Levothyroxine Nausea And Vomiting    Patient states that the 75 mcg is too strong for him.  Rennis Harding [Vortioxetine] Nausea And Vomiting    Home Medications: Medications Prior to Admission  Medication Sig Dispense Refill Last Dose  . ALPRAZolam (XANAX) 1 MG tablet Take 1 mg by mouth 2 (two) times daily as needed for anxiety.    07/25/2019 at Unknown time  . Cholecalciferol (VITAMIN D3) 1.25 MG (50000 UT) CAPS Take 1 capsule by mouth once a week.   Past Week at Unknown time  . diclofenac (VOLTAREN) 75 MG EC tablet Take 1 tablet (75 mg total) by mouth 2 (two) times daily. (Patient taking differently: Take 75 mg by mouth daily. ) 60 tablet 2 07/25/2019 at Unknown time  . diltiazem (CARDIZEM CD) 240 MG 24 hr capsule Take 1 capsule by mouth once daily 90 capsule 1 07/25/2019 at Unknown time  . lisinopril (ZESTRIL) 20 MG tablet Take 1 tablet (20 mg total) by mouth daily. 90 tablet 3 07/25/2019 at Unknown time  . LYRICA 200 MG capsule Take 400 mg by mouth daily.   2 07/25/2019 at Unknown time   . Magnesium Hydroxide (MAGNESIA PO) Take 1 tablet by mouth daily.   Past Week at Unknown time  . ondansetron (ZOFRAN) 4 MG tablet Take 1 tablet (4 mg total) by mouth every 8 (eight) hours as needed for nausea or vomiting. 20 tablet 0   . chlorthalidone (HYGROTON) 25 MG tablet Take 1 tablet by mouth once daily (Patient not taking: Reported on 07/26/2019) 90 tablet 1 Not Taking at Unknown time  . levothyroxine (SYNTHROID) 50 MCG tablet Take 1 tablet (50 mcg total) by mouth daily. (Patient not taking: Reported on 07/26/2019) 90 tablet 3 Not Taking at Unknown time  . Potassium (POTASSIMIN PO) Take 2 tablets by mouth daily.      . Red Yeast Rice Extract (RED YEAST RICE PO) Take by mouth in the morning and at bedtime.  (Patient not taking: Reported on 07/26/2019)   Not Taking at Unknown time  . zinc gluconate 50 MG tablet Take 50  mg by mouth every 30 (thirty) days. Take with vitamin d (Patient not taking: Reported on 07/26/2019)   Not Taking at Unknown time   Home medication reconciliation was completed with the patient.   Scheduled Inpatient Medications:   . chlordiazePOXIDE  25 mg Oral QID   Followed by  . chlordiazePOXIDE  25 mg Oral TID   Followed by  . [START ON 07/28/2019] chlordiazePOXIDE  25 mg Oral BH-qamhs   Followed by  . [START ON 07/29/2019] chlordiazePOXIDE  25 mg Oral Daily  . diclofenac  75 mg Oral BID WC  . diltiazem  240 mg Oral Daily  . docusate sodium  100 mg Oral BID  . folic acid  1 mg Oral Daily  . heparin  5,000 Units Subcutaneous Q8H  . levothyroxine  50 mcg Oral Q0600  . pregabalin  200 mg Oral BID  . sodium chloride flush  3 mL Intravenous Q12H  . thiamine  100 mg Oral Daily    Continuous Inpatient Infusions:   . levofloxacin (LEVAQUIN) IV      PRN Inpatient Medications:  albuterol, chlordiazePOXIDE, HYDROmorphone (DILAUDID) injection, hydrOXYzine, levalbuterol, loperamide, ondansetron **OR** ondansetron (ZOFRAN) IV, oxyCODONE, senna-docusate, sorbitol,  traMADol  Family History: family history includes Alzheimer's disease in his mother; Coronary artery disease in his father and paternal grandfather; Heart disease in his father.    Social History:   reports that he quit smoking about 40 years ago. He has never used smokeless tobacco. He reports previous alcohol use of about 8.0 standard drinks of alcohol per week. He reports that he does not use drugs.   Review of Systems: Constitutional: Weight gain Eyes: No changes in vision. ENT: No oral lesions, sore throat.  GI: see HPI.  Heme/Lymph: No easy bruising.  CV: No chest pain.  GU: No hematuria.  Integumentary: No rashes.  Neuro: No headaches.  Psych: No depression/anxiety.  Endocrine: No heat/cold intolerance.  Allergic/Immunologic: No urticaria.  Resp: No cough, SOB.  Musculoskeletal: No joint swelling.    Physical Examination: BP (!) 96/54 (BP Location: Left Arm)   Pulse 89   Temp 99.2 F (37.3 C) (Oral)   Resp 20   Ht 5\' 10"  (1.778 m)   Wt 127.6 kg   SpO2 96%   BMI 40.36 kg/m  Gen: NAD, alert and oriented x 4 HEENT: PEERLA, EOMI, Neck: supple, no JVD or thyromegaly Chest: CTA bilaterally, no wheezes, crackles, or other adventitious sounds CV: RRR, no m/g/c/r Abd: soft, obese, NT, questionable mild distention, +BS in all four quadrants; no HSM, guarding, ridigity, or rebound tenderness Ext: no edema, well perfused with 2+ pulses, Skin: no rash or lesions noted Lymph: no LAD  Data: Lab Results  Component Value Date   WBC 6.8 07/27/2019   HGB 14.3 07/27/2019   HCT 45.3 07/27/2019   MCV 106.1 (H) 07/27/2019   PLT 167 07/27/2019   Recent Labs  Lab 07/26/19 0219 07/27/19 0424  HGB 14.6 14.3   Lab Results  Component Value Date   NA 139 07/27/2019   K 4.1 07/27/2019   CL 104 07/27/2019   CO2 26 07/27/2019   BUN 7 07/27/2019   CREATININE 0.85 07/27/2019   Lab Results  Component Value Date   ALT 24 07/27/2019   AST 59 (H) 07/27/2019   ALKPHOS 149 (H)  07/27/2019   BILITOT 1.4 (H) 07/27/2019   Recent Labs  Lab 07/27/19 0424  APTT 38*  INR 1.3*      Assessment/Plan: Mr. Quiroa is  a 59 y.o. male admitted for new diagnosis of ascites and cirrhosis.  He is status post paracentesis yesterday with 4.2 L removed and is feeling better.  Electrolytes and kidney function are stable-we will start him on Lasix 40 and Aldactone 100 mg while inpatient.  Long discussion regarding alcohol abstinence and he seems agreeable to this for the future.  Will need monitoring of electrolytes daily while inpatient.  Given CT showing paraesophageal varices would consider outpatient endoscopy for further evaluation.  Acute hepatitis panel is negative.  Case discussed with Dr. Laural Golden who will see patient and provide further recommendations.    Thank you for the consult. Please call with questions or concerns.  Laurine Blazer, PA-C [facility]

## 2019-07-27 NOTE — Progress Notes (Addendum)
PROGRESS NOTE    Earl [name]  CZY:[id number] DOB: 06/20/1960 DOA: 07/26/2019 PCP: Sharion Balloon, FNP   Chief Complaint  Patient presents with  . Abdominal Pain    Brief Narrative: [name] is Earl [name] 59 year old male with extensive history of alcohol abuse, h/o colon cancer, depression, hypertension, obesity, vitamin D deficiency, hypothyroidism, presented to the ED with chief complaint abdominal pain, abdominal distention, intoxicated with alcohol.  Patient reports has been drinking since he been depressed as his dad recently passed away.  But seems like he has extensive history of alcohol use and abuse.   He was admitted with altered mental status due to etoh intoxication and abdominal distension and discomfort.  He was also found to have possible aspiration pneumonia.  Assessment & Plan:   Active Problems:   Essential hypertension   Vitamin B 12 deficiency   Alcohol use   Atrial flutter (HCC)   History of colon cancer   ETOHism (HCC)   Pneumonia   PNA (pneumonia)   Lactic acidosis   Alcoholic hepatitis with ascites  Alcohol Abuse  Acute metabolic Encephalopathy 2/2 etoh intoxication - Thiamine/folate/MVI  -CIWA protocol with Librium taper - he denies hx of complicated withdrawal  -Patient is advised extensively regarding cessation of alcohol use and abuse - he expresses that he plans to remain abstinent   Abdominal pain  Cirrhosis with Ascites: - with hx etoh use, cirrhosis is likely 2/2 etoh - meld 11 (6% 3 month mortality) - s/p paracentesis 6/29 with 4.2 L out -> not c/w SBP - follow pending cytology and para labs - follow ascites culture  - started on lasix/spironolactone - negative HIV and acute hepatitis panel - appreciate GI recommendations   Pneumonia Likely aspiration pneumonia -likely 2/2 etoh intoxication -follow SLP evaluation -Blood cultures NGTD -Initiated antibiotics -IV Levaquin -CXR 07/28/19  Lactic acidosis - resolved -follow  cultures, UA without evidence of infection  Hx Aflutter s/p Ablation -Currently normal sinus rhythm -chart hx with hx of aflutter s/p ablation in 2018 -not on anticoagulation  -continues on cardizem  Hypertension -lisinopril currently on hold, continue diltiazem -chlorthalidone will need to be d/c'd with initiation of lasix -started on spironolactone  History of anxiety/depression -Currently on as needed Xanax and Lyrica -Above medication will be held as patient can be on CIWA protocol with Librium  Hypothyroidism:  - continue synthroid  DVT prophylaxis: heparin  Code Status: full  Family Communication: none at bedside Disposition:   Status is: Inpatient  Remains inpatient appropriate because:Inpatient level of care appropriate due to severity of illness   Dispo: The patient is from: Home              Anticipated d/c is to: Home              Anticipated d/c date is: 2 days              Patient currently is not medically stable to d/c.   Consultants:   gastroenterology  Procedures:   none  Antimicrobials:  Anti-infectives (From admission, onward)   Start     Dose/Rate Route Frequency Ordered Stop   07/26/19 1600  vancomycin (VANCOREADY) IVPB 750 mg/150 mL  Status:  Discontinued        750 mg 150 mL/hr over 60 Minutes Intravenous Every 12 hours 07/26/19 0404 07/26/19 1129   07/26/19 0930  levofloxacin (LEVAQUIN) IVPB 750 mg     Discontinue     750 mg 100 mL/hr over 90 Minutes  Intravenous Every 24 hours 07/26/19 0919     07/26/19 0415  vancomycin (VANCOREADY) IVPB 2000 mg/400 mL        2,000 mg 200 mL/hr over 120 Minutes Intravenous  Once 07/26/19 0404 07/26/19 0738   07/26/19 0400  levofloxacin (LEVAQUIN) IVPB 750 mg        750 mg 100 mL/hr over 90 Minutes Intravenous  Once 07/26/19 0358 07/26/19 0652      Subjective: Feels better No hx complicated withdrawal Drinking Earl [name] few 5ths of wine Earl [name] day, last drink prior to admission  Objective: Vitals:    07/26/19 2001 07/27/19 0001 07/27/19 0358 07/27/19 0500  BP:  120/74 (!) 96/54   Pulse:  (!) 103 89   Resp:  20 20   Temp:  98.3 F (36.8 C) 99.2 F (37.3 C)   TempSrc:  Oral Oral   SpO2: 99% 96% 96%   Weight:    127.6 kg  Height:        Intake/Output Summary (Last 24 hours) at 07/27/2019 1048 Last data filed at 07/27/2019 0400 Gross per 24 hour  Intake 1969.59 ml  Output --  Net 1969.59 ml   Filed Weights   07/26/19 0158 07/27/19 0500  Weight: 124 kg 127.6 kg    Examination:  General exam: Appears calm and comfortable  Respiratory system: Clear to auscultation. Respiratory effort normal. Cardiovascular system: S1 & S2 heard, RRR. Gastrointestinal system: Abdomen is nondistended, soft and nontender. Central nervous system: Alert and oriented. No focal neurological deficits. Extremities: no LEE Skin: No rashes, lesions or ulcers Psychiatry: Judgement and insight appear normal. Mood & affect appropriate.     Data Reviewed: I have personally reviewed following labs and imaging studies  CBC: Recent Labs  Lab 07/26/19 0219 07/27/19 0424  WBC 8.8 6.8  NEUTROABS 4.1  --   HGB 14.6 14.3  HCT 45.8 45.3  MCV 106.0* 106.1*  PLT 235 742    Basic Metabolic Panel: Recent Labs  Lab 07/26/19 0219 07/26/19 0800 07/27/19 0424  NA 137  --  139  K 4.1  --  4.1  CL 104  --  104  CO2 23  --  26  GLUCOSE 102*  --  86  BUN 10  --  7  CREATININE 1.34*  --  0.85  CALCIUM 8.0* 7.6* 7.8*  MG  --  2.0  --   PHOS  --  2.7  --     GFR: Estimated Creatinine Clearance: 123.9 mL/min (by C-G formula based on SCr of 0.85 mg/dL).  Liver Function Tests: Recent Labs  Lab 07/26/19 0219 07/27/19 0424  AST 77* 59*  ALT 29 24  ALKPHOS 166* 149*  BILITOT 1.1 1.4*  PROT 6.4* 6.0*  ALBUMIN 2.8* 2.5*    CBG: Recent Labs  Lab 07/26/19 0931 07/26/19 1159 07/27/19 0808  GLUCAP 101* 88 79     Recent Results (from the past 240 hour(s))  SARS Coronavirus 2 by RT PCR  (hospital order, performed in [facility] hospital lab) Nasopharyngeal Nasopharyngeal Swab     Status: None   Collection Time: 07/26/19  3:57 AM   Specimen: Nasopharyngeal Swab  Result Value Ref Range Status   SARS Coronavirus 2 NEGATIVE NEGATIVE Final    Comment: (NOTE) SARS-CoV-2 target nucleic acids are NOT DETECTED.  The SARS-CoV-2 RNA is generally detectable in upper and lower respiratory specimens during the acute phase of infection. The lowest concentration of SARS-CoV-2 viral copies this assay can detect is 250 copies /  mL. Rinoa Garramone negative result does not preclude SARS-CoV-2 infection and should not be used as the sole basis for treatment or other patient management decisions.  Myley Bahner negative result may occur with improper specimen collection / handling, submission of specimen other than nasopharyngeal swab, presence of viral mutation(s) within the areas targeted by this assay, and inadequate number of viral copies (<250 copies / mL). Jamye Balicki negative result must be combined with clinical observations, patient history, and epidemiological information.  Fact Sheet for Patients:   StrictlyIdeas.no  Fact Sheet for Healthcare Providers: BankingDealers.co.za  This test is not yet approved or  cleared by the [place] FDA and has been authorized for detection and/or diagnosis of SARS-CoV-2 by FDA under an Emergency Use Authorization (EUA).  This EUA will remain in effect (meaning this test can be used) for the duration of the COVID-19 declaration under Section 564(b)(1) of the Act, 21 U.S.C. section 360bbb-3(b)(1), unless the authorization is terminated or revoked sooner.  Performed at [facility], [address], [place], [place] [address]   Blood culture (routine x 2)     Status: None (Preliminary result)   Collection Time: 07/26/19  4:14 AM   Specimen: BLOOD  Result Value Ref Range Status   Specimen Description BLOOD LEFT ANTECUBITAL   Final   Special Requests   Final    BOTTLES DRAWN AEROBIC AND ANAEROBIC Blood Culture adequate volume   Culture   Final    NO GROWTH <12 HOURS Performed at [facility], [address] [place], [place] [address]    Report Status PENDING  Incomplete  Blood culture (routine x 2)     Status: None (Preliminary result)   Collection Time: 07/26/19  4:16 AM   Specimen: BLOOD LEFT HAND  Result Value Ref Range Status   Specimen Description BLOOD LEFT HAND  Final   Special Requests   Final    BOTTLES DRAWN AEROBIC AND ANAEROBIC Blood Culture adequate volume   Culture   Final    NO GROWTH <12 HOURS Performed at [facility], [address] [place], [place] [address]    Report Status PENDING  Incomplete  Gram stain     Status: None   Collection Time: 07/26/19 11:25 AM   Specimen: Ascitic; Body Fluid  Result Value Ref Range Status   Specimen Description ASCITIC  Final   Special Requests NONE  Final   Gram Stain   Final    CYTOSPIN SMEAR NO ORGANISMS SEEN WBC PRESENT, PREDOMINANTLY MONONUCLEAR Performed at [facility], [address] [place], [place] [address]    Report Status 07/26/2019 FINAL  Final  Culture, body fluid-bottle     Status: None (Preliminary result)   Collection Time: 07/26/19 11:25 AM   Specimen: Ascitic  Result Value Ref Range Status   Specimen Description ASCITIC  Final   Special Requests   Final    10CC BOTTLES DRAWN AEROBIC AND ANAEROBIC Performed at [facility], [address] [place], [place] [address]    Culture PENDING  Incomplete   Report Status PENDING  Incomplete         Radiology Studies: CT Head Wo Contrast  Result Date: 07/26/2019 CLINICAL DATA:  Encephalopathy.  Alcohol abuse today. EXAM: CT HEAD WITHOUT CONTRAST TECHNIQUE: Contiguous axial images were obtained from the base of the skull through the vertex without intravenous contrast. COMPARISON:  Brain MRI 05/27/2017 FINDINGS: Brain: No evidence of acute infarction, hemorrhage, hydrocephalus,  extra-axial collection or mass lesion/mass effect. Vascular: No hyperdense vessel or unexpected calcification.  Skull: Normal. Negative for fracture or focal lesion. Sinuses/Orbits: No acute finding. IMPRESSION: Negative head CT. Electronically Signed   By: Monte Fantasia M.D.   On: 07/26/2019 05:15   CT ABDOMEN PELVIS W CONTRAST  Result Date: 07/26/2019 CLINICAL DATA:  Abdominal distension and abdominal pain. EXAM: CT ABDOMEN AND PELVIS WITH CONTRAST TECHNIQUE: Multidetector CT imaging of the abdomen and pelvis was performed using the standard protocol following bolus administration of intravenous contrast. CONTRAST:  1109mL OMNIPAQUE IOHEXOL 300 MG/ML  SOLN COMPARISON:  CT scan 04/21/2019 FINDINGS: Lower chest: Mild eventration of the right hemidiaphragm with overlying right lower lobe atelectasis. No pleural effusion or worrisome pulmonary lesions. The heart is normal in size. No pericardial effusion. Paraesophageal varices are suspected. Hepatobiliary: Cirrhotic changes involving the liver with an irregular liver contour, prominent hepatic fissures, increased caudate to right lobe ratio, portal venous hypertension with portal venous collaterals and large volume abdominal/pelvic ascites. No focal hepatic lesions are identified. The gallbladder is mildly contracted. No common bile duct dilatation. Pancreas: No mass, inflammation or ductal dilatation. Spleen: Mild splenomegaly. The spleen measures 14.5 x 12.0 x 9.4 cm. Adrenals/Urinary Tract: Adrenal glands and kidneys are unremarkable except for Takari Lundahl stable right renal calculus. No worrisome renal lesions or hydronephrosis. Stomach/Bowel: The stomach, duodenum, small bowel and colon are grossly normal. No acute inflammatory changes, mass lesions or obstructive findings. Vascular/Lymphatic: Scattered atherosclerotic calcifications involving the aorta but no aneurysm or dissection. The branch vessels are patent. The major venous structures are patent. The portal and  splenic veins are patent. Scattered borderline periportal lymph nodes, typical with cirrhosis. No mass or overt adenopathy. Small scattered retroperitoneal lymph nodes are stable. Reproductive: The prostate gland and seminal vesicles are unremarkable. Other: No pelvic mass or pelvic adenopathy. Small scattered lymph nodes are stable. Musculoskeletal: No significant bony findings. IMPRESSION: 1. 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 2. No focal hepatic lesions. 3. Stable right renal calculus. 4. Aortic atherosclerosis. Aortic Atherosclerosis (ICD10-I70.0). Electronically Signed   By: Marijo Sanes M.D.   On: 07/26/2019 05:17   US Paracentesis  Result Date: 07/26/2019 INDICATION: Ascites EXAM: ULTRASOUND GUIDED DIAGNOSTIC AND THERAPEUTIC PARACENTESIS MEDICATIONS: None COMPLICATIONS: None immediate PROCEDURE: Informed written consent was obtained from the patient after Merril Isakson discussion of the risks, benefits and alternatives to treatment. Nataliya Graig timeout was performed prior to the initiation of the procedure. Initial ultrasound scanning demonstrates Bernardette Waldron large amount of ascites within the right lower abdominal quadrant. The right lower abdomen was prepped and draped in the usual sterile fashion. 1% lidocaine was used for local anesthesia. Following this, Kashmere Daywalt 5 [place] Yueh catheter was introduced. An ultrasound image was saved for documentation purposes. The paracentesis was performed. The catheter was removed and Gordie Belvin dressing was applied. The patient tolerated the procedure well without immediate post procedural complication. FINDINGS: Torian Thoennes total of approximately 4.2 L of yellow ascitic fluid fluid was removed. Samples were sent to the laboratory as requested by the clinical team. IMPRESSION: Successful ultrasound-guided paracentesis yielding 4.2 liters of peritoneal fluid. Electronically Signed   By: Lavonia Dana M.D.   On:  07/26/2019 12:04   DG Chest Portable 1 View  Result Date: 07/26/2019 CLINICAL DATA:  Short of breath, intoxicated EXAM: PORTABLE CHEST 1 VIEW COMPARISON:  01/07/2019 FINDINGS: Single frontal view of the chest demonstrates an unremarkable cardiac silhouette. Loop recorder is identified overlying cardiac apex. There is right basilar consolidation and likely small right effusion, which may reflect pneumonia  or aspiration. Left chest is clear. No pneumothorax. IMPRESSION: 1. Right basilar consolidation and effusion, compatible with infection or aspiration. Electronically Signed   By: Randa Ngo M.D.   On: 07/26/2019 02:58        Scheduled Meds: . chlordiazePOXIDE  25 mg Oral TID   Followed by  . [START ON 07/28/2019] chlordiazePOXIDE  25 mg Oral BH-qamhs   Followed by  . [START ON 07/29/2019] chlordiazePOXIDE  25 mg Oral Daily  . diclofenac  75 mg Oral BID WC  . diltiazem  240 mg Oral Daily  . docusate sodium  100 mg Oral BID  . folic acid  1 mg Oral Daily  . furosemide  40 mg Oral Daily  . heparin  5,000 Units Subcutaneous Q8H  . levothyroxine  50 mcg Oral Q0600  . pregabalin  200 mg Oral BID  . sodium chloride flush  3 mL Intravenous Q12H  . spironolactone  100 mg Oral Daily  . thiamine  100 mg Oral Daily   Continuous Infusions: . levofloxacin (LEVAQUIN) IV 750 mg (07/27/19 0951)     LOS: 1 day    Time spent: over 30 min    Earl Helper, MD [facility]   To contact the attending provider between 7A-7P or the covering provider during after hours 7P-7A, please log into the web site [email or web address] and access using universal [place] password for that web site. If you do not have the password, please call the hospital operator.  07/27/2019, 10:48 AM

## 2019-07-27 NOTE — Evaluation (Signed)
Physical Therapy Evaluation Patient Details Name: Earl [name] MRN: [id number] DOB: 1960-05-20 Today's Date: 07/27/2019   History of Present Illness  [name] is a 59 year old male with extensive history of alcohol abuse, h/o colon cancer, depression, hypertension, obesity, vitamin D deficiency, hypothyroidism, presented to the ED with chief complaint abdominal pain, abdominal distention, intoxicated with alcohol. Patient reports has been drinking since he been depressed as his dad recently passed away.  But seems like he has extensive history of alcohol use and abuse. Patient Denies having: Fever, Chills, Cough, SOB, Chest Pain, headache, dizziness, lightheadedness,  Dysuria, Joint pain, rash, open wounds    Clinical Impression  Patient functioning at baseline for functional mobility and gait, other than increased HR during ambulation up to 150's - RN notified.  Plan:  Patient discharged from physical therapy to care of nursing for ambulation daily as tolerated for length of stay.     Follow Up Recommendations No PT follow up    Equipment Recommendations  None recommended by PT    Recommendations for Other Services       Precautions / Restrictions Precautions Precautions: None Restrictions Weight Bearing Restrictions: No      Mobility  Bed Mobility Overal bed mobility: Independent                Transfers Overall transfer level: Independent                  Ambulation/Gait Ambulation/Gait assistance: Modified independent (Device/Increase time) Gait Distance (Feet): 250 Feet Assistive device: None Gait Pattern/deviations: WFL(Within Functional Limits) Gait velocity: decreased   General Gait Details: demonstrates good return for ambulation in room/hallways without loss of balance, HR increased to 150's  Stairs            Wheelchair Mobility    Modified Rankin (Stroke Patients Only)       Balance Overall balance assessment: Modified  Independent                                           Pertinent Vitals/Pain Pain Assessment: Faces Faces Pain Scale: Hurts a little bit Pain Location: right shoulder, upper arm Pain Descriptors / Indicators: Aching;Discomfort Pain Intervention(s): Limited activity within patient's tolerance;Monitored during session    Home Living Family/patient expects to be discharged to:: Private residence Living Arrangements: Alone Available Help at Discharge: Family;Available PRN/intermittently Type of Home: House Home Access: Stairs to enter Entrance Stairs-Rails: None Entrance Stairs-Number of Steps: 1 Home Layout: One level Home Equipment: Walker - 2 wheels;Walker - 4 wheels      Prior Function Level of Independence: Independent         Comments: Hydrographic surveyor, does not drive     Hand Dominance        Extremity/Trunk Assessment   Upper Extremity Assessment Upper Extremity Assessment: Overall WFL for tasks assessed    Lower Extremity Assessment Lower Extremity Assessment: Overall WFL for tasks assessed    Cervical / Trunk Assessment Cervical / Trunk Assessment: Normal  Communication   Communication: No difficulties  Cognition Arousal/Alertness: Awake/alert Behavior During Therapy: WFL for tasks assessed/performed Overall Cognitive Status: Within Functional Limits for tasks assessed  General Comments      Exercises     Assessment/Plan    PT Assessment Patent does not need any further PT services  PT Problem List         PT Treatment Interventions      PT Goals (Current goals can be found in the Care Plan section)  Acute Rehab PT Goals Patient Stated Goal: return home with friends/family to assist if needed PT Goal Formulation: With patient/family Time For Goal Achievement: 07/27/19 Potential to Achieve Goals: Good    Frequency     Barriers to discharge         Co-evaluation               AM-PAC PT "6 Clicks" Mobility  Outcome Measure Help needed turning from your back to your side while in a flat bed without using bedrails?: None Help needed moving from lying on your back to sitting on the side of a flat bed without using bedrails?: None Help needed moving to and from a bed to a chair (including a wheelchair)?: None Help needed standing up from a chair using your arms (e.g., wheelchair or bedside chair)?: None Help needed to walk in hospital room?: None Help needed climbing 3-5 steps with a railing? : None 6 Click Score: 24    End of Session   Activity Tolerance: Patient tolerated treatment well Patient left: in chair;with call bell/phone within reach;with family/visitor present Nurse Communication: Mobility status PT Visit Diagnosis: Unsteadiness on feet (R26.81);Other abnormalities of gait and mobility (R26.89);Muscle weakness (generalized) (M62.81)    Time: 3704-8889 PT Time Calculation (min) (ACUTE ONLY): 20 min   Charges:   PT Evaluation $PT Eval Moderate Complexity: 1 Mod PT Treatments $Therapeutic Activity: 8-22 mins        3:55 PM, 07/27/19 Lonell Grandchild, MPT Physical Therapist with [facility] 336 [phone] office [phone] mobile phone

## 2019-07-27 NOTE — Evaluation (Signed)
Clinical/Bedside Swallow Evaluation Patient Details  Name: Earl [name] MRN: [id number] Date of Birth: 01/10/1961  Today's Date: 07/27/2019 Time: SLP Start Time (ACUTE ONLY): 0160 SLP Stop Time (ACUTE ONLY): 1737 SLP Time Calculation (min) (ACUTE ONLY): 23 min  Past Medical History:  Past Medical History:  Diagnosis Date  . Alcohol abuse   . Colon cancer ([place])   . Depression   . Gall stones   . Hypertension   . Kidney stone   . Typical atrial flutter [facility])    Past Surgical History:  Past Surgical History:  Procedure Laterality Date  . A-FLUTTER ABLATION N/A 07/31/2016   Procedure: A-Flutter Ablation;  Surgeon: [name], MD;  Location: [facility];  Service: Cardiovascular;  Laterality: N/A;  . COLONOSCOPY N/A 04/17/2016   Procedure: COLONOSCOPY;  Surgeon: [name], MD;  [facility];  Service: Endoscopy;  Laterality: N/A;  1200  . COLONOSCOPY N/A 05/07/2017   Procedure: COLONOSCOPY;  Surgeon: [name], MD;  [facility];  Service: Endoscopy;  Laterality: N/A;  200  . implantable loop recorder placement  01/24/2019    [employer] Reveal Linq model LNQ 11 implantable loop recorder (SN [id number] ) by Dr Rayann Heman for evaluation of palpitations  . MINOR CARPAL TUNNEL     right  . POLYPECTOMY  04/17/2016   Procedure: POLYPECTOMY;  Surgeon: [name], MD;  Location[facility];  Service: Endoscopy;;  sigmoid   HPI:  [name] is a 59 year old male with extensive history of alcohol abuse, h/o colon cancer, depression, hypertension, obesity, vitamin D deficiency, hypothyroidism, presented to the ED with chief complaint abdominal pain, abdominal distention, intoxicated with alcohol. CXR 6/29 indicates: "Right basilar consolidation and effusion, compatible with infection or aspiration."   Assessment / Plan / Recommendation Clinical Impression  Clinical swallowing evaluation completed while Pt was sitting upright in chair; Pt  consumed thin liquids, puree textures and regular textures without overt s/sx of aspiration. Pt reports having a "choking" episode prior to admission on liquid. Pt reports this "rarely ever happens". SLP reviewed universal aspiration precautions with Pt. Recommend Pt remain on regular/thin diet with meds to be adminsitered whole with liquids.  There are no further ST needs noted at this time. ST to sign off, thank you for this referral. SLP Visit Diagnosis: Dysphagia, unspecified (R13.10)    Aspiration Risk  No limitations    Diet Recommendation Regular;Thin liquid   Liquid Administration via: Cup;Straw Medication Administration: Whole meds with liquid Supervision: Patient able to self feed Compensations: Slow rate;Small sips/bites Postural Changes: Seated upright at 90 degrees;Remain upright for at least 30 minutes after po intake    Other  Recommendations Oral Care Recommendations: Oral care BID   Follow up Recommendations None             Prognosis Prognosis for Safe Diet Advancement: Good      Swallow Study   General Date of Onset: 07/26/19 HPI: [name] is a 59 year old male with extensive history of alcohol abuse, h/o colon cancer, depression, hypertension, obesity, vitamin D deficiency, hypothyroidism, presented to the ED with chief complaint abdominal pain, abdominal distention, intoxicated with alcohol. CXR 6/29 indicates: "Right basilar consolidation and effusion, compatible with infection or aspiration." Type of Study: Bedside Swallow Evaluation Previous Swallow Assessment: none in chart Diet Prior to this Study: Regular;Thin liquids Temperature Spikes Noted: No Respiratory Status: Room air History of Recent Intubation: No Behavior/Cognition: Alert;Cooperative;Pleasant mood Oral Cavity Assessment: Within Functional Limits  Oral Care Completed by SLP: Recent completion by staff Oral Cavity - Dentition: Adequate natural dentition Vision: Functional for  self-feeding Self-Feeding Abilities: Able to feed self Patient Positioning: Upright in chair Baseline Vocal Quality: Normal Volitional Cough: Strong Volitional Swallow: Able to elicit    Oral/Motor/Sensory Function Overall Oral Motor/Sensory Function: Within functional limits   Ice Chips Ice chips: Within functional limits   Thin Liquid Thin Liquid: Within functional limits    Nectar Thick Nectar Thick Liquid: Not tested   Honey Thick Honey Thick Liquid: Not tested   Puree Puree: Within functional limits   Solid     Solid: Within functional limits     Earl [name], CCC-SLP Speech Language Pathologist  Earl [name] 07/27/2019,6:37 PM

## 2019-07-27 NOTE — Progress Notes (Signed)
OT Cancellation Note  Patient Details Name: Earl [name] MRN: [id number] DOB: 30-Aug-1960   Cancelled Treatment:    Reason Eval/Treat Not Completed: OT screened, no needs identified, will sign off. Pt is performing ADLs and mobility tasks at mod independent level. No further OT services required at this time.   Guadelupe Sabin, OTR/L  [phone] 07/27/2019, 5:36 PM

## 2019-07-27 NOTE — Progress Notes (Signed)
   07/27/19 1900  Assess: MEWS Score  ECG Heart Rate (!) 124  Assess: MEWS Score  MEWS Temp 0  MEWS Systolic 1  MEWS Pulse 2  MEWS RR 0  MEWS LOC 0  MEWS Score 3  MEWS Score Color Yellow  Assess: if the MEWS score is Yellow or Red  Were vital signs taken at a resting state? No  Focused Assessment Documented focused assessment  Early Detection of Sepsis Score *See Row Information* Low  MEWS guidelines implemented *See Row Information* No, vital signs rechecked  Treat  MEWS Interventions Other (Comment) (Patient rested)  Document  Patient Outcome Stabilized after interventions  Progress note created (see row info) Yes

## 2019-07-28 ENCOUNTER — Inpatient Hospital Stay (HOSPITAL_COMMUNITY): Payer: Medicaid Other

## 2019-07-28 DIAGNOSIS — Z7289 Other problems related to lifestyle: Secondary | ICD-10-CM

## 2019-07-28 DIAGNOSIS — I4729 Other ventricular tachycardia: Secondary | ICD-10-CM

## 2019-07-28 DIAGNOSIS — I472 Ventricular tachycardia: Secondary | ICD-10-CM

## 2019-07-28 DIAGNOSIS — J189 Pneumonia, unspecified organism: Secondary | ICD-10-CM

## 2019-07-28 DIAGNOSIS — Z85038 Personal history of other malignant neoplasm of large intestine: Secondary | ICD-10-CM

## 2019-07-28 DIAGNOSIS — R188 Other ascites: Secondary | ICD-10-CM

## 2019-07-28 LAB — COMPREHENSIVE METABOLIC PANEL
ALT: 24 U/L (ref 0–44)
AST: 54 U/L — ABNORMAL HIGH (ref 15–41)
Albumin: 2.7 g/dL — ABNORMAL LOW (ref 3.5–5.0)
Alkaline Phosphatase: 141 U/L — ABNORMAL HIGH (ref 38–126)
Anion gap: 10 (ref 5–15)
BUN: 8 mg/dL (ref 6–20)
CO2: 24 mmol/L (ref 22–32)
Calcium: 8.3 mg/dL — ABNORMAL LOW (ref 8.9–10.3)
Chloride: 102 mmol/L (ref 98–111)
Creatinine, Ser: 1.05 mg/dL (ref 0.61–1.24)
GFR calc Af Amer: >60 mL/min (ref >60)
GFR calc non Af Amer: >60 mL/min (ref >60)
Glucose, Bld: 75 mg/dL (ref 70–99)
Potassium: 3.7 mmol/L (ref 3.5–5.1)
Sodium: 136 mmol/L (ref 135–145)
Total Bilirubin: 1.8 mg/dL — ABNORMAL HIGH (ref 0.3–1.2)
Total Protein: 6.3 g/dL — ABNORMAL LOW (ref 6.5–8.1)

## 2019-07-28 LAB — CBC
HCT: 45.3 % (ref 39.0–52.0)
Hemoglobin: 14.3 g/dL (ref 13.0–17.0)
MCH: 33.1 pg (ref 26.0–34.0)
MCHC: 31.6 g/dL (ref 30.0–36.0)
MCV: 104.9 fL — ABNORMAL HIGH (ref 80.0–100.0)
Platelets: 155 10*3/uL (ref 150–400)
RBC: 4.32 MIL/uL (ref 4.22–5.81)
RDW: 13 % (ref 11.5–15.5)
WBC: 5.1 10*3/uL (ref 4.0–10.5)
nRBC: 0 % (ref 0.0–0.2)

## 2019-07-28 LAB — GLUCOSE, CAPILLARY: Glucose-Capillary: 79 mg/dL (ref 70–99)

## 2019-07-28 LAB — ECHOCARDIOGRAM COMPLETE
Height: 70 in
Weight: 4494.4 oz

## 2019-07-28 LAB — PHOSPHORUS: Phosphorus: 3.7 mg/dL (ref 2.5–4.6)

## 2019-07-28 LAB — MAGNESIUM: Magnesium: 1.7 mg/dL (ref 1.7–2.4)

## 2019-07-28 MED ORDER — MAGNESIUM SULFATE 2 GM/50ML IV SOLN
2.0000 g | Freq: Once | INTRAVENOUS | Status: AC
  Administered 2019-07-28: 2 g via INTRAVENOUS
  Filled 2019-07-28: qty 50

## 2019-07-28 MED ORDER — ENSURE ENLIVE PO LIQD
237.0000 mL | Freq: Two times a day (BID) | ORAL | Status: DC
  Administered 2019-07-30: 237 mL via ORAL

## 2019-07-28 MED ORDER — NADOLOL 40 MG PO TABS
40.0000 mg | ORAL_TABLET | Freq: Every day | ORAL | Status: DC
  Filled 2019-07-28 (×2): qty 1

## 2019-07-28 MED ORDER — POTASSIUM CHLORIDE CRYS ER 20 MEQ PO TBCR
40.0000 meq | EXTENDED_RELEASE_TABLET | Freq: Once | ORAL | Status: AC
  Administered 2019-07-28: 40 meq via ORAL
  Filled 2019-07-28: qty 2

## 2019-07-28 NOTE — Progress Notes (Addendum)
PROGRESS NOTE    Earl [name]  ZJI:[id number] DOB: 02-16-1960 DOA: 07/26/2019 PCP: Sharion Balloon, FNP   Chief Complaint  Patient presents with  . Abdominal Pain    Brief Narrative: [name] is Earl [name] 59 year old male with extensive history of alcohol abuse, h/o colon cancer, depression, hypertension, obesity, vitamin D deficiency, hypothyroidism, presented to the ED with chief complaint abdominal pain, abdominal distention, intoxicated with alcohol.  Patient reports has been drinking since he been depressed as his dad recently passed away.  But seems like he has extensive history of alcohol use and abuse.   He was admitted with altered mental status due to etoh intoxication and abdominal distension and discomfort.  He was also found to have possible aspiration pneumonia.  Assessment & Plan:   Active Problems:   Essential hypertension   Vitamin B 12 deficiency   Alcohol use   Atrial flutter (HCC)   History of colon cancer   ETOHism (HCC)   Pneumonia   PNA (pneumonia)   Lactic acidosis   Other ascites  Hx Aflutter s/p Ablation  Nonsustained Vtach -Currently normal sinus rhythm -chart hx with hx of aflutter s/p ablation in 2018 -not on anticoagulation  -continues on cardizem  ~29 beats NSVT yesterday evening, continue diltiazem - obtain echo, will c/s cardiology   Alcohol Abuse  Acute metabolic Encephalopathy 2/2 etoh intoxication - Thiamine/folate/MVI  -CIWA protocol with Librium taper - he denies hx of complicated withdrawal  -Patient is advised extensively regarding cessation of alcohol use and abuse - he expresses that he plans to remain abstinent   Abdominal pain  Cirrhosis with Ascites: - with hx etoh use, cirrhosis is likely 2/2 etoh - meld 11 (6% 3 month mortality) - s/p paracentesis 6/29 with 4.2 L out -> not c/w SBP - follow pending cytology and para labs - follow ascites culture NGTD x2  - started on lasix/spironolactone - negative HIV and acute  hepatitis panel - appreciate GI recommendations   Pneumonia Likely aspiration pneumonia -likely 2/2 etoh intoxication -follow SLP evaluation -> regular/thin liquids -Blood cultures NGTD -Initiated antibiotics -IV Levaquin -CXR 6/30 with improved aeration in the R base  Lactic acidosis - resolved -follow cultures, UA without evidence of infection  Hypertension -lisinopril currently on hold, continue diltiazem -chlorthalidone will need to be d/c'd with initiation of lasix -started on spironolactone  History of anxiety/depression -Currently on as needed Xanax and Lyrica -Above medication will be held as patient can be on CIWA protocol with Librium  Hypothyroidism:  - continue synthroid  R arm pain R arm pain is chronic, negative clavicle and shoulder plain films Chronic stable benign appearing mixed cystic and sclerotic lesion in proximal R humeral shaft 2.8 cm in length unchanged since 2014  Follow outpatient   DVT prophylaxis: heparin  Code Status: full  Family Communication: none at bedside - daughter over phone Disposition:   Status is: Inpatient  Remains inpatient appropriate because:Inpatient level of care appropriate due to severity of illness   Dispo: The patient is from: Home              Anticipated d/c is to: Home              Anticipated d/c date is: 2 days              Patient currently is not medically stable to d/c.   Consultants:   gastroenterology  Procedures:   none  Antimicrobials:  Anti-infectives (From admission, onward)   Start  Dose/Rate Route Frequency Ordered Stop   07/26/19 1600  vancomycin (VANCOREADY) IVPB 750 mg/150 mL  Status:  Discontinued        750 mg 150 mL/hr over 60 Minutes Intravenous Every 12 hours 07/26/19 0404 07/26/19 1129   07/26/19 0930  levofloxacin (LEVAQUIN) IVPB 750 mg     Discontinue     750 mg 100 mL/hr over 90 Minutes Intravenous Every 24 hours 07/26/19 0919     07/26/19 0415  vancomycin (VANCOREADY)  IVPB 2000 mg/400 mL        2,000 mg 200 mL/hr over 120 Minutes Intravenous  Once 07/26/19 0404 07/26/19 0738   07/26/19 0400  levofloxacin (LEVAQUIN) IVPB 750 mg        750 mg 100 mL/hr over 90 Minutes Intravenous  Once 07/26/19 0358 07/26/19 0652      Subjective: Feels well, no complaints  Objective: Vitals:   07/27/19 1846 07/27/19 2159 07/28/19 0500 07/28/19 0526  BP: 99/78 95/67  100/63  Pulse:  78  77  Resp:  20  18  Temp:  98.8 F (37.1 C)  98.9 F (37.2 C)  TempSrc:  Oral  Oral  SpO2:  99%  97%  Weight:   127.4 kg   Height:        Intake/Output Summary (Last 24 hours) at 07/28/2019 1015 Last data filed at 07/27/2019 2239 Gross per 24 hour  Intake 450 ml  Output --  Net 450 ml   Filed Weights   07/26/19 0158 07/27/19 0500 07/28/19 0500  Weight: 124 kg 127.6 kg 127.4 kg    Examination:  General: No acute distress. Cardiovascular: Heart sounds show Earl [name] regular rate, and rhythm Lungs: Clear to auscultation bilaterally  Abdomen: Soft, nontender, distended Neurological: Alert and oriented 3. Moves all extremities 4. Cranial nerves II through XII grossly intact. Skin: Warm and dry. No rashes or lesions. Extremities: No clubbing or cyanosis. Trace edema.    Data Reviewed: I have personally reviewed following labs and imaging studies  CBC: Recent Labs  Lab 07/26/19 0219 07/27/19 0424 07/28/19 0553  WBC 8.8 6.8 5.1  NEUTROABS 4.1  --   --   HGB 14.6 14.3 14.3  HCT 45.8 45.3 45.3  MCV 106.0* 106.1* 104.9*  PLT 235 167 595    Basic Metabolic Panel: Recent Labs  Lab 07/26/19 0219 07/26/19 0800 07/27/19 0424 07/28/19 0553  NA 137  --  139 136  K 4.1  --  4.1 3.7  CL 104  --  104 102  CO2 23  --  26 24  GLUCOSE 102*  --  86 75  BUN 10  --  7 8  CREATININE 1.34*  --  0.85 1.05  CALCIUM 8.0* 7.6* 7.8* 8.3*  MG  --  2.0  --  1.7  PHOS  --  2.7  --  3.7    GFR: Estimated Creatinine Clearance: 100.3 mL/min (by C-G formula based on SCr of 1.05  mg/dL).  Liver Function Tests: Recent Labs  Lab 07/26/19 0219 07/27/19 0424 07/28/19 0553  AST 77* 59* 54*  ALT 29 24 24   ALKPHOS 166* 149* 141*  BILITOT 1.1 1.4* 1.8*  PROT 6.4* 6.0* 6.3*  ALBUMIN 2.8* 2.5* 2.7*    CBG: Recent Labs  Lab 07/26/19 0931 07/26/19 1159 07/27/19 0808 07/28/19 0734  GLUCAP 101* 88 79 79     Recent Results (from the past 240 hour(s))  SARS Coronavirus 2 by RT PCR (hospital order, performed in [facility] hospital lab)  Nasopharyngeal Nasopharyngeal Swab     Status: None   Collection Time: 07/26/19  3:57 AM   Specimen: Nasopharyngeal Swab  Result Value Ref Range Status   SARS Coronavirus 2 NEGATIVE NEGATIVE Final    Comment: (NOTE) SARS-CoV-2 target nucleic acids are NOT DETECTED.  The SARS-CoV-2 RNA is generally detectable in upper and lower respiratory specimens during the acute phase of infection. The lowest concentration of SARS-CoV-2 viral copies this assay can detect is 250 copies / mL. Earl [name] negative result does not preclude SARS-CoV-2 infection and should not be used as the sole basis for treatment or other patient management decisions.  Earl [name] negative result may occur with improper specimen collection / handling, submission of specimen other than nasopharyngeal swab, presence of viral mutation(s) within the areas targeted by this assay, and inadequate number of viral copies (<250 copies / mL). Earl [name] negative result must be combined with clinical observations, patient history, and epidemiological information.  Fact Sheet for Patients:   StrictlyIdeas.no  Fact Sheet for Healthcare Providers: BankingDealers.co.za  This test is not yet approved or  cleared by the [place] FDA and has been authorized for detection and/or diagnosis of SARS-CoV-2 by FDA under an Emergency Use Authorization (EUA).  This EUA will remain in effect (meaning this test can be used) for the duration of the COVID-19  declaration under Section 564(b)(1) of the Act, 21 U.S.C. section 360bbb-3(b)(1), unless the authorization is terminated or revoked sooner.  Performed at [facility], [address], [place], [place] [address]   Blood culture (routine x 2)     Status: None (Preliminary result)   Collection Time: 07/26/19  4:14 AM   Specimen: BLOOD  Result Value Ref Range Status   Specimen Description BLOOD LEFT ANTECUBITAL  Final   Special Requests   Final    BOTTLES DRAWN AEROBIC AND ANAEROBIC Blood Culture adequate volume   Culture   Final    NO GROWTH 2 DAYS Performed at [facility], [address] [place], [place] [address]    Report Status PENDING  Incomplete  Blood culture (routine x 2)     Status: None (Preliminary result)   Collection Time: 07/26/19  4:16 AM   Specimen: BLOOD LEFT HAND  Result Value Ref Range Status   Specimen Description BLOOD LEFT HAND  Final   Special Requests   Final    BOTTLES DRAWN AEROBIC AND ANAEROBIC Blood Culture adequate volume   Culture   Final    NO GROWTH 2 DAYS Performed at [facility], [address] [place], [name] [address]    Report Status PENDING  Incomplete  Gram stain     Status: None   Collection Time: 07/26/19 11:25 AM   Specimen: Ascitic; Body Fluid  Result Value Ref Range Status   Specimen Description ASCITIC  Final   Special Requests NONE  Final   Gram Stain   Final    CYTOSPIN SMEAR NO ORGANISMS SEEN WBC PRESENT, PREDOMINANTLY MONONUCLEAR Performed at [facility], [address], [place], [place] [address]    Report Status 07/26/2019 FINAL  Final  Culture, body fluid-bottle     Status: None (Preliminary result)   Collection Time: 07/26/19 11:25 AM   Specimen: Ascitic  Result Value Ref Range Status   Specimen Description ASCITIC  Final   Special Requests 10CC BOTTLES DRAWN AEROBIC AND ANAEROBIC  Final   Culture   Final    NO GROWTH 2 DAYS Performed at [facility], [address] [place], [place] [address]  Report Status  PENDING  Incomplete         Radiology Studies: DG Clavicle Right  Result Date: 07/27/2019 CLINICAL DATA:  Right clavicle pain since Earl [name] fall 2 weeks ago. EXAM: RIGHT CLAVICLE - 2+ VIEWS COMPARISON:  None. FINDINGS: There is no fracture, dislocation, or other significant abnormality. IMPRESSION: Negative. Electronically Signed   By: Lorriane Shire M.D.   On: 07/27/2019 17:13   DG Shoulder Right  Result Date: 07/27/2019 CLINICAL DATA:  Right shoulder and right clavicle pain secondary to Earl [name] fall 2 weeks ago. EXAM: RIGHT SHOULDER - 2+ VIEW COMPARISON:  Radiographs dated 01/07/2019 and 11/04/2012 FINDINGS: No fracture or dislocation or other acute bone abnormality. The acromion the fibular joint appears normal. Humeral joint is normal. Chronic stable benign-appearing mixed cystic and sclerotic lesion in the proximal right humeral shaft measuring 2.8 cm in length is unchanged since 2014 and consistent enchondroma. IMPRESSION: No acute abnormality. Electronically Signed   By: Lorriane Shire M.D.   On: 07/27/2019 17:12   US Paracentesis  Result Date: 07/26/2019 INDICATION: Ascites EXAM: ULTRASOUND GUIDED DIAGNOSTIC AND THERAPEUTIC PARACENTESIS MEDICATIONS: None COMPLICATIONS: None immediate PROCEDURE: Informed written consent was obtained from the patient after Earl [name] discussion of the risks, benefits and alternatives to treatment. Earl [name] timeout was performed prior to the initiation of the procedure. Initial ultrasound scanning demonstrates Earl [name] large amount of ascites within the right lower abdominal quadrant. The right lower abdomen was prepped and draped in the usual sterile fashion. 1% lidocaine was used for local anesthesia. Following this, Earl [name] 5 [place] Earl [name] catheter was introduced. An ultrasound image was saved for documentation purposes. The paracentesis was performed. The catheter was removed and Earl [name] dressing was applied. The patient tolerated the procedure well without immediate post procedural complication.  FINDINGS: Earl [name] total of approximately 4.2 L of yellow ascitic fluid fluid was removed. Samples were sent to the laboratory as requested by the clinical team. IMPRESSION: Successful ultrasound-guided paracentesis yielding 4.2 liters of peritoneal fluid. Electronically Signed   By: Earl [name] M.D.   On: 07/26/2019 12:04   DG CHEST PORT 1 VIEW  Result Date: 07/27/2019 CLINICAL DATA:  Shortness of breath, pleural effusion EXAM: PORTABLE CHEST 1 VIEW COMPARISON:  07/26/2019 FINDINGS: Cardiac shadow is stable. Loop recorder is again noted. Previously seen right basilar atelectasis and effusion have improved significantly. No new focal infiltrate is noted. No bony abnormality is seen. IMPRESSION: Significant improved aeration in the right base. Electronically Signed   By: Earl [name] M.D.   On: 07/27/2019 13:18        Scheduled Meds: . chlordiazePOXIDE  25 mg Oral BH-qamhs   Followed by  . [START ON 07/29/2019] chlordiazePOXIDE  25 mg Oral Daily  . diclofenac Sodium  2 g Topical QID  . diltiazem  240 mg Oral Daily  . docusate sodium  100 mg Oral BID  . folic acid  1 mg Oral Daily  . furosemide  40 mg Oral Daily  . heparin  5,000 Units Subcutaneous Q8H  . levothyroxine  50 mcg Oral Q0600  . nadolol  40 mg Oral Daily  . pregabalin  200 mg Oral BID  . sodium chloride flush  3 mL Intravenous Q12H  . spironolactone  100 mg Oral Daily  . thiamine  100 mg Oral Daily   Continuous Infusions: . levofloxacin (LEVAQUIN) IV 750 mg (07/28/19 0908)     LOS: 2 days    Time spent: over 30 min    Fayrene Helper, MD [facility]  To contact the attending provider between 7A-7P or the covering provider during after hours 7P-7A, please log into the web site [email or web address] and access using universal [place] password for that web site. If you do not have the password, please call the hospital operator.  07/28/2019, 10:15 AM

## 2019-07-28 NOTE — Plan of Care (Signed)
Nutrition Education Note  RD consulted for low sodium diet education  Patient out of bed adjusting his covers this morning at RD visit. He reports good appetite and oral intake s/p paracentesis. Patient reports poor appetite and early satiety at home, stated that he felt like he ate an entire [place] and it was sitting in his stomach. Patient recalls eating mostly canned soups over the past few weeks.   RD provided "Low Sodium Nutrition Therapy" handout from the Academy of Nutrition and Dietetics. Reviewed patient's dietary recall. Provided examples on ways to decrease sodium intake in diet. Discouraged intake of processed foods and use of salt shaker. Encouraged fresh fruits and vegetables as well as whole grain sources of carbohydrates to maximize fiber intake.   RD discussed why it is important for patient to adhere to diet recommendations, and emphasized the role of fluids, foods to avoid, and encouraged small frequent meals/snacks throughout the day verses 3 larger meals, recommended intake of daily nutrition supplement with decreased appetite and meal intake. Patient amenable to trying strawberry Ensure during admission.   Teach back method used. Expect fair compliance.  Body mass index is 41.34 kg/m. Pt meets criteria for morbid obesity based on current BMI.  Current diet order is 2gm Na, patient is consuming approximately 100% of meals at this time. Labs and medications reviewed. No further nutrition interventions warranted at this time. RD contact information provided. If additional nutrition issues arise, please re-consult RD.   Lajuan Lines, RD, LDN Clinical Nutrition After Hours/Weekend Pager # in [place]

## 2019-07-28 NOTE — Progress Notes (Signed)
Subjective:  Patient said he feels much better.  He can now bend and touch his toes.  He was not able to do that before he came to the hospital.  States he has been walking the hall and not short of breath.  His appetite is good.  He does not feel his abdomen is becoming more distended.  He feels he is responding to diuretic therapy.  He denies chest pain shortness of breath or cough.  Current Medications:  Current Facility-Administered Medications:  .  albuterol (PROVENTIL) (2.5 MG/3ML) 0.083% nebulizer solution 2.5 mg, 2.5 mg, Nebulization, Q2H PRN, Shahmehdi, Seyed A, MD .  chlordiazePOXIDE (LIBRIUM) capsule 25 mg, 25 mg, Oral, Q6H PRN, Shahmehdi, Seyed A, MD, 25 mg at 07/28/19 0855 .  [EXPIRED] chlordiazePOXIDE (LIBRIUM) capsule 25 mg, 25 mg, Oral, QID, 25 mg at 07/26/19 2354 **FOLLOWED BY** [COMPLETED] chlordiazePOXIDE (LIBRIUM) capsule 25 mg, 25 mg, Oral, TID, 25 mg at 07/27/19 2200 **FOLLOWED BY** chlordiazePOXIDE (LIBRIUM) capsule 25 mg, 25 mg, Oral, BH-qamhs **FOLLOWED BY** [START ON 07/29/2019] chlordiazePOXIDE (LIBRIUM) capsule 25 mg, 25 mg, Oral, Daily, Shahmehdi, Seyed A, MD .  diclofenac Sodium (VOLTAREN) 1 % topical gel 2 g, 2 g, Topical, QID, Elodia Florence., MD, 2 g at 07/28/19 1530 .  diltiazem (CARDIZEM CD) 24 hr capsule 240 mg, 240 mg, Oral, Daily, Shahmehdi, Seyed A, MD, 240 mg at 07/28/19 0856 .  docusate sodium (COLACE) capsule 100 mg, 100 mg, Oral, BID, Shahmehdi, Seyed A, MD, 100 mg at 07/28/19 0855 .  feeding supplement (ENSURE ENLIVE) (ENSURE ENLIVE) liquid 237 mL, 237 mL, Oral, BID BM, Elodia Florence., MD .  folic acid (FOLVITE) tablet 1 mg, 1 mg, Oral, Daily, Shahmehdi, Seyed A, MD, 1 mg at 07/28/19 0855 .  furosemide (LASIX) tablet 40 mg, 40 mg, Oral, Daily, Laurine Blazer A, PA-C, 40 mg at 07/28/19 0856 .  heparin injection 5,000 Units, 5,000 Units, Subcutaneous, Q8H, Shahmehdi, Seyed A, MD, 5,000 Units at 07/28/19 0529 .  hydrOXYzine (ATARAX/VISTARIL)  tablet 25 mg, 25 mg, Oral, Q6H PRN, Shahmehdi, Seyed A, MD, 25 mg at 07/27/19 2200 .  levalbuterol (XOPENEX) nebulizer solution 0.63 mg, 0.63 mg, Nebulization, Q6H PRN, Shahmehdi, Seyed A, MD .  levofloxacin (LEVAQUIN) IVPB 750 mg, 750 mg, Intravenous, Q24H, Shahmehdi, Seyed A, MD, Last Rate: 100 mL/hr at 07/28/19 0908, 750 mg at 07/28/19 0908 .  levothyroxine (SYNTHROID) tablet 50 mcg, 50 mcg, Oral, Q0600, Shahmehdi, Seyed A, MD, 50 mcg at 07/28/19 0529 .  loperamide (IMODIUM) capsule 2-4 mg, 2-4 mg, Oral, PRN, Shahmehdi, Seyed A, MD .  ondansetron (ZOFRAN) tablet 4 mg, 4 mg, Oral, Q6H PRN **OR** ondansetron (ZOFRAN) injection 4 mg, 4 mg, Intravenous, Q6H PRN, Shahmehdi, Seyed A, MD .  oxyCODONE (Oxy IR/ROXICODONE) immediate release tablet 5 mg, 5 mg, Oral, Q4H PRN, Shahmehdi, Seyed A, MD, 5 mg at 07/28/19 1522 .  pregabalin (LYRICA) capsule 200 mg, 200 mg, Oral, BID, Darrick Meigs, Marge Duncans, MD, 200 mg at 07/28/19 0855 .  senna-docusate (Senokot-S) tablet 1 tablet, 1 tablet, Oral, QHS PRN, Shahmehdi, Seyed A, MD .  sodium chloride flush (NS) 0.9 % injection 3 mL, 3 mL, Intravenous, Q12H, Shahmehdi, Seyed A, MD, 3 mL at 07/27/19 2201 .  sorbitol 70 % solution 30 mL, 30 mL, Oral, Daily PRN, Shahmehdi, Seyed A, MD .  spironolactone (ALDACTONE) tablet 100 mg, 100 mg, Oral, Daily, Laurine Blazer A, PA-C, 100 mg at 07/28/19 0856 .  thiamine tablet 100 mg, 100 mg, Oral, Daily,  Deatra James, MD, 100 mg at 07/28/19 0856 .  traMADol (ULTRAM) tablet 50 mg, 50 mg, Oral, Q8H PRN, Shahmehdi, Seyed A, MD   Objective: Blood pressure (!) 100/56, pulse 84, temperature 98.4 F (36.9 C), temperature source Oral, resp. rate 18, height 5' 10"  (1.778 m), weight 130.7 kg, SpO2 96 %. Patient is alert and in no acute distress. Asterixis absent. Abdomen is protuberant.  Bowel sounds are normal.  On palpation it is not tense or tender.  Difficult to palpate liver or spleen. There is edema noted around  ankles.   Labs/studies Results:  CBC Latest Ref Rng & Units 07/28/2019 07/27/2019 07/26/2019  WBC 4.0 - 10.5 K/uL 5.1 6.8 8.8  Hemoglobin 13.0 - 17.0 g/dL 14.3 14.3 14.6  Hematocrit 39 - 52 % 45.3 45.3 45.8  Platelets 150 - 400 K/uL 155 167 235    CMP Latest Ref Rng & Units 07/28/2019 07/27/2019 07/26/2019  Glucose 70 - 99 mg/dL 75 86 -  BUN 6 - 20 mg/dL 8 7 -  Creatinine 0.61 - 1.24 mg/dL 1.05 0.85 -  Sodium 135 - 145 mmol/L 136 139 -  Potassium 3.5 - 5.1 mmol/L 3.7 4.1 -  Chloride 98 - 111 mmol/L 102 104 -  CO2 22 - 32 mmol/L 24 26 -  Calcium 8.9 - 10.3 mg/dL 8.3(L) 7.8(L) 7.6(L)  Total Protein 6.5 - 8.1 g/dL 6.3(L) 6.0(L) -  Total Bilirubin 0.3 - 1.2 mg/dL 1.8(H) 1.4(H) -  Alkaline Phos 38 - 126 U/L 141(H) 149(H) -  AST 15 - 41 U/L 54(H) 59(H) -  ALT 0 - 44 U/L 24 24 -    Hepatic Function Latest Ref Rng & Units 07/28/2019 07/27/2019 07/26/2019  Total Protein 6.5 - 8.1 g/dL 6.3(L) 6.0(L) 6.4(L)  Albumin 3.5 - 5.0 g/dL 2.7(L) 2.5(L) 2.8(L)  AST 15 - 41 U/L 54(H) 59(H) 77(H)  ALT 0 - 44 U/L 24 24 29   Alk Phosphatase 38 - 126 U/L 141(H) 149(H) 166(H)  Total Bilirubin 0.3 - 1.2 mg/dL 1.8(H) 1.4(H) 1.1  Bilirubin, Direct 0.0 - 0.2 mg/dL - - -      Assessment:  #1.  New onset of ascites secondary to cirrhosis.  He underwent LVAP 2 days ago with removal of 4.2 L of fluid.  Patient begun on furosemide and spironolactone yesterday.  Ascites is not reaccumulating.  Therefore no indication for repeat tap. Review of weight records revealed that he has gained 15 pounds since admission which is hard to believe.  He lost 8-1/2 pounds with paracenteses. Ascitic fluid cultures remain negative. Hepatitis discriminant function is 18.42.  Therefore there is no indication for prednisolone.  #2.  Cirrhosis.  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  He developed somewhat rapid decompensation at he had CT 3 months ago and did not have ascites.  Hepatic function is  expected to improve with alcohol abstinence.  #3.  Community-acquired pneumonia.  He does not have productive cough or fever.  Patient remains on Levaquin.  #4.  Nonsustained SVT.  Dr. Nelly Laurence note appreciated.  Recommendations  INR and metabolic 7 in a.m.

## 2019-07-28 NOTE — Progress Notes (Signed)
*  PRELIMINARY RESULTS* Echocardiogram 2D Echocardiogram has been performed.  Leavy Cella 07/28/2019, 11:32 AM

## 2019-07-28 NOTE — Progress Notes (Signed)
Patient discussed with primary team, 29 beat run of NSVT last night at 920 PM. No recurrences. K is 3.7, Mg 1.7. Would give KCl 40 mEq, Mg 2 grams to get K to 4 and Mg to 2. Echo showed LVEF 60-65%, no WMAs. Patient being managed for pneumonia, also with history of EtOH abuse. Would monitor  telemetry overnight, if no recurrences would monitor as outpatient as he has a loop recorder. If recurrent arrhythmias would consider ischemic evaluation. We will follow up telemetry in AM.    Carlyle Dolly MD

## 2019-07-29 DIAGNOSIS — I471 Supraventricular tachycardia: Secondary | ICD-10-CM

## 2019-07-29 DIAGNOSIS — I472 Ventricular tachycardia: Secondary | ICD-10-CM

## 2019-07-29 LAB — COMPREHENSIVE METABOLIC PANEL
ALT: 23 U/L (ref 0–44)
AST: 66 U/L — ABNORMAL HIGH (ref 15–41)
Albumin: 2.8 g/dL — ABNORMAL LOW (ref 3.5–5.0)
Alkaline Phosphatase: 132 U/L — ABNORMAL HIGH (ref 38–126)
Anion gap: 9 (ref 5–15)
BUN: 10 mg/dL (ref 6–20)
CO2: 28 mmol/L (ref 22–32)
Calcium: 8.3 mg/dL — ABNORMAL LOW (ref 8.9–10.3)
Chloride: 101 mmol/L (ref 98–111)
Creatinine, Ser: 1.13 mg/dL (ref 0.61–1.24)
GFR calc Af Amer: >60 mL/min (ref >60)
GFR calc non Af Amer: >60 mL/min (ref >60)
Glucose, Bld: 91 mg/dL (ref 70–99)
Potassium: 3.9 mmol/L (ref 3.5–5.1)
Sodium: 138 mmol/L (ref 135–145)
Total Bilirubin: 1.3 mg/dL — ABNORMAL HIGH (ref 0.3–1.2)
Total Protein: 6.3 g/dL — ABNORMAL LOW (ref 6.5–8.1)

## 2019-07-29 LAB — CBC
HCT: 45 % (ref 39.0–52.0)
Hemoglobin: 14.2 g/dL (ref 13.0–17.0)
MCH: 33.3 pg (ref 26.0–34.0)
MCHC: 31.6 g/dL (ref 30.0–36.0)
MCV: 105.4 fL — ABNORMAL HIGH (ref 80.0–100.0)
Platelets: 151 10*3/uL (ref 150–400)
RBC: 4.27 MIL/uL (ref 4.22–5.81)
RDW: 12.9 % (ref 11.5–15.5)
WBC: 5.2 10*3/uL (ref 4.0–10.5)
nRBC: 0 % (ref 0.0–0.2)

## 2019-07-29 LAB — GLUCOSE, CAPILLARY: Glucose-Capillary: 90 mg/dL (ref 70–99)

## 2019-07-29 LAB — LIPASE, FLUID: Lipase-Fluid: 6 U/L

## 2019-07-29 LAB — MAGNESIUM: Magnesium: 2.1 mg/dL (ref 1.7–2.4)

## 2019-07-29 LAB — PROTIME-INR
INR: 1.2 (ref 0.8–1.2)
Prothrombin Time: 14.4 seconds (ref 11.4–15.2)

## 2019-07-29 LAB — PHOSPHORUS: Phosphorus: 3.6 mg/dL (ref 2.5–4.6)

## 2019-07-29 MED ORDER — DILTIAZEM HCL ER COATED BEADS 180 MG PO CP24
300.0000 mg | ORAL_CAPSULE | Freq: Every day | ORAL | Status: DC
  Administered 2019-07-30: 300 mg via ORAL
  Filled 2019-07-29: qty 1

## 2019-07-29 MED ORDER — FUROSEMIDE 40 MG PO TABS: 40.0000 mg | ORAL_TABLET | Freq: Every day | ORAL | 1 refills | Status: DC

## 2019-07-29 MED ORDER — FUROSEMIDE 20 MG PO TABS: 20.0000 mg | ORAL_TABLET | Freq: Every day | ORAL | Status: DC

## 2019-07-29 MED ORDER — CHLORDIAZEPOXIDE HCL 25 MG PO CAPS
25.0000 mg | ORAL_CAPSULE | Freq: Once | ORAL | Status: AC | PRN
  Administered 2019-07-29: 25 mg via ORAL
  Filled 2019-07-29: qty 1

## 2019-07-29 MED ORDER — DILTIAZEM HCL 30 MG PO TABS: 30.0000 mg | ORAL_TABLET | Freq: Four times a day (QID) | ORAL | Status: AC

## 2019-07-29 MED ORDER — SPIRONOLACTONE 25 MG PO TABS
100.0000 mg | ORAL_TABLET | Freq: Every day | ORAL | Status: DC
  Filled 2019-07-29: qty 4

## 2019-07-29 MED ORDER — SPIRONOLACTONE 25 MG PO TABS: 50.0000 mg | ORAL_TABLET | Freq: Every day | ORAL | Status: DC

## 2019-07-29 MED ORDER — METOPROLOL TARTRATE 5 MG/5ML IV SOLN
2.5000 mg | INTRAVENOUS | Status: DC | PRN
  Administered 2019-07-29: 2.5 mg via INTRAVENOUS
  Filled 2019-07-29: qty 5

## 2019-07-29 MED ORDER — FOLIC ACID 1 MG PO TABS: 1.0000 mg | ORAL_TABLET | Freq: Every day | ORAL | 0 refills | Status: AC

## 2019-07-29 MED ORDER — SPIRONOLACTONE 100 MG PO TABS: 100.0000 mg | ORAL_TABLET | Freq: Every day | ORAL | 1 refills | Status: DC

## 2019-07-29 MED ORDER — ALPRAZOLAM 1 MG PO TABS: 1.0000 mg | ORAL_TABLET | Freq: Two times a day (BID) | ORAL | Status: DC | PRN

## 2019-07-29 MED ORDER — FUROSEMIDE 40 MG PO TABS
40.0000 mg | ORAL_TABLET | Freq: Every day | ORAL | Status: DC
  Filled 2019-07-29: qty 1

## 2019-07-29 MED ORDER — THIAMINE HCL 100 MG PO TABS: 100.0000 mg | ORAL_TABLET | Freq: Every day | ORAL | 0 refills | Status: AC

## 2019-07-29 MED ORDER — MIDODRINE HCL 5 MG PO TABS
5.0000 mg | ORAL_TABLET | Freq: Three times a day (TID) | ORAL | Status: DC
  Administered 2019-07-29 – 2019-07-30 (×3): 5 mg via ORAL
  Filled 2019-07-29 (×4): qty 1

## 2019-07-29 NOTE — Progress Notes (Signed)
Telemetry reviewed, no recurrent NSVT. Occurred in setting of pneumonia, patient with history of chronic EtOH use. Echo with normal LVEF, no WMAs. He has a loop recorder for his prior history of atrial arrhythmias. WOuld not plan on any additional inpatient testing, if recurrent NSVT noted by loop recorder checks would likely warrant an ischemic evaluation. We will arrange outpatient f/u.    Zandra Abts MD

## 2019-07-29 NOTE — Discharge Summary (Addendum)
Physician Discharge Summary  SELWYN REASON HWE:[id number] DOB: [birth year] DOA: 07/26/2019  PCP: Sharion Balloon, FNP  Admit date: 07/26/2019 Discharge date: 07/30/2019  Time spent: 40 minutes  Recommendations for Outpatient Follow-up:  1. Follow outpatient CBC/CMP 2. Follow with gastroenterology outpatient for cirrhosis/ascites - follow pending ascites labs  3. Follow up with cardiology outpatient for NSVT - follow loop recorder 4. Encourage etoh cessation 5. Follow CXR outpatient 6. Follow repeat TSH outpatient  7. Follow R arm pain/cystic/sclerotic lesion in proximal humerus outpatient  Discharge Diagnoses:  Active Problems:   Essential hypertension   Vitamin B 12 deficiency   Alcohol use   Atrial flutter (HCC)   History of colon cancer   ETOHism (HCC)   Pneumonia   PNA (pneumonia)   Lactic acidosis   Other ascites   NSVT (nonsustained ventricular tachycardia) (HCC)   SVT (supraventricular tachycardia) ([place])  Discharge Condition: stable  Diet recommendation: low sodium   Filed Weights   07/28/19 1128 07/29/19 0500 07/30/19 0500  Weight: 130.7 kg 126.2 kg 125 kg    History of present illness:  [name] is a 59 year old male with extensive history of alcohol abuse,h/ocolon cancer, depression, hypertension, obesity, vitamin D deficiency,hypothyroidism,presented to the ED with chief complaint abdominal pain, abdominal distention, intoxicated with alcohol.  Patient reports has been drinking since he been depressed as his dad recently passed away. But seems like he has extensive history of alcohol use and abuse.   He was admitted with altered mental status due to etoh intoxication and abdominal distension and discomfort.  He was also found to have possible aspiration pneumonia.  He was admitted with abdominal distension, aspiration pneumonia, and acute metabolic encephalopathy 2/2 etoh intoxication.  He's now s/p paracentesis and has been started on  lasix/spironolactone for his ascites and is doing well on this.  S/p treatment with abx for pneumonia.  Hospitalization c/b NSVT and he was evaluated by cardiology with plan to follow outpatient with his loop recorder.  Hospital Course:  Hx Aflutter s/p Ablation  Nonsustained Vtach -Currently normal sinus rhythm -chart hx with hx of aflutter s/p ablation in 2018 -not on anticoagulation  -continues on cardizem  ~29 beats NSVT 6/30 evening, continue diltiazem - obtain echo (EF 60-65%, no regional WMA).  Appreciate cardiology assistance, no recurrent NSVT.  Plan for follow outpatient and continue to monitor loop recorder.  If recurrent NSVT, would warrant ischemic evaluation per cardiology.   Alcohol Abuse  Acute metabolic Encephalopathy 2/2 etoh intoxication - Thiamine/folate/MVI  -CIWA protocol with Librium taper - he denies hx of complicated withdrawal  -Patient is advised extensively regarding cessation of alcohol use and abuse - he expresses that he plans to remain abstinent   Abdominal pain  Cirrhosis with Ascites: - with hx etoh use, cirrhosis is likely 2/2 etoh - meld 11 (6% 3 month mortality) - s/p paracentesis 6/29 with 4.2 L out -> not c/w SBP - follow pending cytology (reactive mesothelial cells) and para labs (pending lipase) - follow ascites culture NGTD x2  - started on lasix/spironolactone - tolerating this well  - negative HIV and acute hepatitis panel - appreciate GI recommendations - will need outpatient follow up  Pneumonia Likely aspiration pneumonia -likely 2/2 etoh intoxication -follow SLP evaluation -> regular/thin liquids -Blood cultures NGTD -Initiated antibiotics-IV Levaquin (s/p 4 doses over 5 days - will stop and follow outpatient) -CXR 6/30 with improved aeration in the R base - repeat CXR outpatient  Lactic acidosis - resolved -follow cultures (NG,  blood and urine), UA without evidence of infection  Hypertension -d/c lisinopril and  chlorthalidone -continue diltiazem - started on spironolactone and lasix as noted above   History of anxiety/depression -Currently on as needed Xanax and Lyrica -resume home meds  Hypothyroidism:  - continue synthroid  R arm pain R arm pain is chronic, negative clavicle and shoulder plain films Chronic stable benign appearing mixed cystic and sclerotic lesion in proximal R humeral shaft 2.8 cm in length unchanged since 2014  Follow outpatient   Procedures: Paracentesis 6/29  Consultations:  IR  GI  Cardiology  Discharge Exam: Vitals:   07/30/19 0817 07/30/19 1140  BP: 124/85 111/74  Pulse: 83 83  Resp:  18  Temp:  98.2 F (36.8 C)  SpO2:  97%   Feels better Eager to go home Discussed d/c plan and recs  General: No acute distress. Cardiovascular: Heart sounds show a regular rate, and rhythm. Lungs: Clear to auscultation bilaterally  Abdomen: Soft, nontender, distended Neurological: Alert and oriented 3. Moves all extremities 4 . Cranial nerves II through XII grossly intact. Skin: Warm and dry. No rashes or lesions. Extremities: No clubbing or cyanosis. Trace edema.    Discharge Instructions   Discharge Instructions    Call MD for:  difficulty breathing, headache or visual disturbances   Complete by: As directed    Call MD for:  extreme fatigue   Complete by: As directed    Call MD for:  hives   Complete by: As directed    Call MD for:  persistant dizziness or light-headedness   Complete by: As directed    Call MD for:  persistant nausea and vomiting   Complete by: As directed    Call MD for:  redness, tenderness, or signs of infection (pain, swelling, redness, odor or green/yellow discharge around incision site)   Complete by: As directed    Call MD for:  severe uncontrolled pain   Complete by: As directed    Call MD for:  temperature >100.4   Complete by: As directed    Diet - low sodium heart healthy   Complete by: As directed    Diet -  low sodium heart healthy   Complete by: As directed    Discharge instructions   Complete by: As directed    You were seen for new onset ascites and pneumonia.   You were treated with antibiotics for your pneumonia.  Your ascites was treated with paracentesis and you've now been started on lasix and spironolactone.  Continue these medicines to help prevent the reaccumulation of fluid.  You have cirrhosis which is likely related to your alcohol use.  Abstinence is extremely important.  Please follow up with Dr. Laural Golden as an outpatient.  You had a fast heart rate that was noted on 6/30.  Please follow up with cardiology.   Please follow up a chest x ray outpatient.  You should have follow up labs within 1 week.  Return for new, recurrent, or worsening symptoms.  Please ask your PCP to request records from this hospitalization so they know what was done and what the next steps will be.   Increase activity slowly   Complete by: As directed    Increase activity slowly   Complete by: As directed      Allergies as of 07/30/2019      Reactions   Amitriptyline Anaphylaxis   Amoxicillin Anaphylaxis, Rash   Has patient had a PCN reaction causing immediate rash, facial/tongue/throat swelling,  SOB or lightheadedness with hypotension: Yes Has patient had a PCN reaction causing severe rash involving mucus membranes or skin necrosis: No Has patient had a PCN reaction that required hospitalization: No Has patient had a PCN reaction occurring within the last 10 years: Yes If all of the above answers are "NO", then may proceed with Cephalosporin use.   Asa [aspirin] Anaphylaxis   Penicillin G Anaphylaxis   Clonidine Derivatives Other (See Comments)   Severe Dry Mouth   Hydrochlorothiazide    Caused rectal bleeding and hematuria   Levothyroxine Nausea And Vomiting   Patient states that the 75 mcg is too strong for him.   Trintellix [vortioxetine] Nausea And Vomiting      Medication List     STOP taking these medications   chlorthalidone 25 MG tablet Commonly known as: HYGROTON   diclofenac 75 MG EC tablet Commonly known as: VOLTAREN   lisinopril 20 MG tablet Commonly known as: ZESTRIL   POTASSIMIN PO     TAKE these medications   ALPRAZolam 1 MG tablet Commonly known as: XANAX Take 1 mg by mouth 2 (two) times daily as needed for anxiety.   chlordiazePOXIDE 10 MG capsule Commonly known as: LIBRIUM Take 1 capsule (10 mg total) by mouth 3 (three) times daily as needed for anxiety.   diltiazem 240 MG 24 hr capsule Commonly known as: CARDIZEM CD Take 1 capsule by mouth once daily What changed: Another medication with the same name was added. Make sure you understand how and when to take each.   diltiazem 300 MG 24 hr capsule Commonly known as: CARDIZEM CD Take 1 capsule (300 mg total) by mouth daily. Start taking on: July 31, 2019 What changed: You were already taking a medication with the same name, and this prescription was added. Make sure you understand how and when to take each.   folic acid 1 MG tablet Commonly known as: FOLVITE Take 1 tablet (1 mg total) by mouth daily.   furosemide 20 MG tablet Commonly known as: LASIX Take 1 tablet (20 mg total) by mouth daily. Start taking on: July 31, 2019   levothyroxine 50 MCG tablet Commonly known as: SYNTHROID Take 1 tablet (50 mcg total) by mouth daily.   Lyrica 200 MG capsule Generic drug: pregabalin Take 400 mg by mouth daily.   MAGNESIA PO Take 1 tablet by mouth daily.   midodrine 5 MG tablet Commonly known as: PROAMATINE Take 1 tablet (5 mg total) by mouth 3 (three) times daily with meals.   ondansetron 4 MG tablet Commonly known as: Zofran Take 1 tablet (4 mg total) by mouth every 8 (eight) hours as needed for nausea or vomiting.   RED YEAST RICE PO Take by mouth in the morning and at bedtime.   spironolactone 50 MG tablet Commonly known as: ALDACTONE Take 1 tablet (50 mg total) by mouth  daily. Start taking on: July 31, 2019   thiamine 100 MG tablet Take 1 tablet (100 mg total) by mouth daily.   Vitamin D3 1.25 MG (50000 UT) Caps Take 1 capsule by mouth once a week.   zinc gluconate 50 MG tablet Take 50 mg by mouth every 30 (thirty) days. Take with vitamin d      Allergies  Allergen Reactions  . Amitriptyline Anaphylaxis  . Amoxicillin Anaphylaxis and Rash    Has patient had a PCN reaction causing immediate rash, facial/tongue/throat swelling, SOB or lightheadedness with hypotension: Yes Has patient had a PCN reaction causing severe  rash involving mucus membranes or skin necrosis: No Has patient had a PCN reaction that required hospitalization: No Has patient had a PCN reaction occurring within the last 10 years: Yes If all of the above answers are "NO", then may proceed with Cephalosporin use.   Diona Fanti [Aspirin] Anaphylaxis  . Penicillin G Anaphylaxis  . Clonidine Derivatives Other (See Comments)    Severe Dry Mouth  . Hydrochlorothiazide     Caused rectal bleeding and hematuria  . Levothyroxine Nausea And Vomiting    Patient states that the 75 mcg is too strong for him.  Rennis Harding [Vortioxetine] Nausea And Vomiting    Follow-up Information    Verta Ellen., NP Follow up on 08/11/2019.   Specialty: Cardiology Why: Cardiology Hospital Follow-up on 08/11/2019 at 11:30 AM.  Contact information: [place] [address] [phone]        Sharion Balloon, FNP Follow up.   Specialty: Family Medicine Contact information: [place] [address] [phone]        Herminio Commons, MD .   Specialty: Cardiology Contact information: [place] [address] [phone]        [name], MD .   Specialty: Cardiology Contact information: [place] [address] [phone]        [name], MD Follow up.   Specialty:  Gastroenterology Contact information: [place], [address] [place] [address] [phone]                The results of significant diagnostics from this hospitalization (including imaging, microbiology, ancillary and laboratory) are listed below for reference.    Significant Diagnostic Studies: DG Clavicle Right  Result Date: 07/27/2019 CLINICAL DATA:  Right clavicle pain since a fall 2 weeks ago. EXAM: RIGHT CLAVICLE - 2+ VIEWS COMPARISON:  None. FINDINGS: There is no fracture, dislocation, or other significant abnormality. IMPRESSION: Negative. Electronically Signed   By: Lorriane Shire M.D.   On: 07/27/2019 17:13   DG Shoulder Right  Result Date: 07/27/2019 CLINICAL DATA:  Right shoulder and right clavicle pain secondary to a fall 2 weeks ago. EXAM: RIGHT SHOULDER - 2+ VIEW COMPARISON:  Radiographs dated 01/07/2019 and 11/04/2012 FINDINGS: No fracture or dislocation or other acute bone abnormality. The acromion the fibular joint appears normal. Humeral joint is normal. Chronic stable benign-appearing mixed cystic and sclerotic lesion in the proximal right humeral shaft measuring 2.8 cm in length is unchanged since 2014 and consistent enchondroma. IMPRESSION: No acute abnormality. Electronically Signed   By: Lorriane Shire M.D.   On: 07/27/2019 17:12   CT Head Wo Contrast  Result Date: 07/26/2019 CLINICAL DATA:  Encephalopathy.  Alcohol abuse today. EXAM: CT HEAD WITHOUT CONTRAST TECHNIQUE: Contiguous axial images were obtained from the base of the skull through the vertex without intravenous contrast. COMPARISON:  Brain MRI 05/27/2017 FINDINGS: Brain: No evidence of acute infarction, hemorrhage, hydrocephalus, extra-axial collection or mass lesion/mass effect. Vascular: No hyperdense vessel or unexpected calcification. Skull: Normal. Negative for fracture or focal lesion. Sinuses/Orbits: No acute finding. IMPRESSION: Negative head CT. Electronically Signed   By: Monte Fantasia  M.D.   On: 07/26/2019 05:15   CT ABDOMEN PELVIS W CONTRAST  Result Date: 07/26/2019 CLINICAL DATA:  Abdominal distension and abdominal pain. EXAM: CT ABDOMEN AND PELVIS WITH CONTRAST TECHNIQUE: Multidetector CT imaging of the abdomen and pelvis was performed using the standard  protocol following bolus administration of intravenous contrast. CONTRAST:  116mL OMNIPAQUE IOHEXOL 300 MG/ML  SOLN COMPARISON:  CT scan 04/21/2019 FINDINGS: Lower chest: Mild eventration of the right hemidiaphragm with overlying right lower lobe atelectasis. No pleural effusion or worrisome pulmonary lesions. The heart is normal in size. No pericardial effusion. Paraesophageal varices are suspected. Hepatobiliary: Cirrhotic changes involving the liver with an irregular liver contour, prominent hepatic fissures, increased caudate to right lobe ratio, portal venous hypertension with portal venous collaterals and large volume abdominal/pelvic ascites. No focal hepatic lesions are identified. The gallbladder is mildly contracted. No common bile duct dilatation. Pancreas: No mass, inflammation or ductal dilatation. Spleen: Mild splenomegaly. The spleen measures 14.5 x 12.0 x 9.4 cm. Adrenals/Urinary Tract: Adrenal glands and kidneys are unremarkable except for a stable right renal calculus. No worrisome renal lesions or hydronephrosis. Stomach/Bowel: The stomach, duodenum, small bowel and colon are grossly normal. No acute inflammatory changes, mass lesions or obstructive findings. Vascular/Lymphatic: Scattered atherosclerotic calcifications involving the aorta but no aneurysm or dissection. The branch vessels are patent. The major venous structures are patent. The portal and splenic veins are patent. Scattered borderline periportal lymph nodes, typical with cirrhosis. No mass or overt adenopathy. Small scattered retroperitoneal lymph nodes are stable. Reproductive: The prostate gland and seminal vesicles are unremarkable. Other: No pelvic  mass or pelvic adenopathy. Small scattered lymph nodes are stable. Musculoskeletal: No significant bony findings. IMPRESSION: 1. 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 2. No focal hepatic lesions. 3. Stable right renal calculus. 4. Aortic atherosclerosis. Aortic Atherosclerosis (ICD10-I70.0). Electronically Signed   By: Marijo Sanes M.D.   On: 07/26/2019 05:17   US Paracentesis  Result Date: 07/26/2019 INDICATION: Ascites EXAM: ULTRASOUND GUIDED DIAGNOSTIC AND THERAPEUTIC PARACENTESIS MEDICATIONS: None COMPLICATIONS: None immediate PROCEDURE: Informed written consent was obtained from the patient after a discussion of the risks, benefits and alternatives to treatment. A timeout was performed prior to the initiation of the procedure. Initial ultrasound scanning demonstrates a large amount of ascites within the right lower abdominal quadrant. The right lower abdomen was prepped and draped in the usual sterile fashion. 1% lidocaine was used for local anesthesia. Following this, a 5 [place] Yueh catheter was introduced. An ultrasound image was saved for documentation purposes. The paracentesis was performed. The catheter was removed and a dressing was applied. The patient tolerated the procedure well without immediate post procedural complication. FINDINGS: A total of approximately 4.2 L of yellow ascitic fluid fluid was removed. Samples were sent to the laboratory as requested by the clinical team. IMPRESSION: Successful ultrasound-guided paracentesis yielding 4.2 liters of peritoneal fluid. Electronically Signed   By: Lavonia Dana M.D.   On: 07/26/2019 12:04   DG CHEST PORT 1 VIEW  Result Date: 07/27/2019 CLINICAL DATA:  Shortness of breath, pleural effusion EXAM: PORTABLE CHEST 1 VIEW COMPARISON:  07/26/2019 FINDINGS: Cardiac shadow is stable. Loop recorder is again noted. Previously seen right basilar  atelectasis and effusion have improved significantly. No new focal infiltrate is noted. No bony abnormality is seen. IMPRESSION: Significant improved aeration in the right base. Electronically Signed   By: Inez Catalina M.D.   On: 07/27/2019 13:18   DG Chest Portable 1 View  Result Date: 07/26/2019 CLINICAL DATA:  Short of breath, intoxicated EXAM: PORTABLE CHEST 1 VIEW COMPARISON:  01/07/2019 FINDINGS: Single frontal view of the chest demonstrates an unremarkable cardiac silhouette. Loop recorder is identified overlying cardiac apex. There is right basilar  consolidation and likely small right effusion, which may reflect pneumonia or aspiration. Left chest is clear. No pneumothorax. IMPRESSION: 1. Right basilar consolidation and effusion, compatible with infection or aspiration. Electronically Signed   By: Randa Ngo M.D.   On: 07/26/2019 02:58   ECHOCARDIOGRAM COMPLETE  Result Date: 07/28/2019    ECHOCARDIOGRAM REPORT   Patient Name:   Earl [name] Date of Exam: 07/28/2019 Medical Rec #:  [id number]       Height:       70.0 in Accession #:    [id number]      Weight:       280.9 lb Date of Birth:  [birth year]       BSA:          2.411 m Patient Age:    62 years        BP:           100/63 mmHg Patient Gender: M               HR:           77 bpm. Exam Location:  [name] Procedure: 2D Echo Indications:    NSVT (nonsustained ventricular tachycardia) ([place]) [[id number]  History:        Patient has prior history of Echocardiogram examinations, most                 recent 06/17/2016. Arrythmias:Atrial Flutter; Risk                 Factors:Hypertension, Former Smoker and Dyslipidemia. ETOH,                 Pneumonia.  Sonographer:    Leavy Cella RDCS (AE) Referring Phys: [id number] A CALDWELL POWELL JR IMPRESSIONS  1. Left ventricular ejection fraction, by estimation, is 60 to 65%. The left ventricle has normal function. The left ventricle has no regional wall motion abnormalities. Left ventricular diastolic  parameters were normal.  2. Right ventricular systolic function is normal. The right ventricular size is normal.  3. The mitral valve is normal in structure. No evidence of mitral valve regurgitation. No evidence of mitral stenosis.  4. The aortic valve has an indeterminant number of cusps. Aortic valve regurgitation is not visualized. No aortic stenosis is present.  5. The inferior vena cava is normal in size with greater than 50% respiratory variability, suggesting right atrial pressure of 3 mmHg. FINDINGS  Left Ventricle: Left ventricular ejection fraction, by estimation, is 60 to 65%. The left ventricle has normal function. The left ventricle has no regional wall motion abnormalities. The left ventricular internal cavity size was normal in size. There is  no left ventricular hypertrophy. Left ventricular diastolic parameters were normal. Right Ventricle: The right ventricular size is normal. No increase in right ventricular wall thickness. Right ventricular systolic function is normal. Left Atrium: Left atrial size was normal in size. Right Atrium: Right atrial size was normal in size. Pericardium: There is no evidence of pericardial effusion. Mitral Valve: The mitral valve is normal in structure. No evidence of mitral valve regurgitation. No evidence of mitral valve stenosis. Tricuspid Valve: The tricuspid valve is normal in structure. Tricuspid valve regurgitation is not demonstrated. No evidence of tricuspid stenosis. Aortic Valve: The aortic valve has an indeterminant number of cusps. Aortic valve regurgitation is not visualized. No aortic stenosis is present. Aortic valve mean gradient measures 3.9 mmHg. Aortic valve peak gradient measures 7.1 mmHg. Aortic valve area, by VTI measures 3.20 cm. Pulmonic  Valve: The pulmonic valve was not well visualized. Pulmonic valve regurgitation is not visualized. No evidence of pulmonic stenosis. Aorta: The aortic root is normal in size and structure. Venous: The  inferior vena cava is normal in size with greater than 50% respiratory variability, suggesting right atrial pressure of 3 mmHg. IAS/Shunts: The interatrial septum was not well visualized.  LEFT VENTRICLE PLAX 2D LVIDd:         4.89 cm  Diastology LVIDs:         2.63 cm  LV e' lateral:   7.29 cm/s LV PW:         0.90 cm  LV E/e' lateral: 8.2 LV IVS:        0.78 cm  LV e' medial:    7.40 cm/s LVOT diam:     2.20 cm  LV E/e' medial:  8.1 LV SV:         78 LV SV Index:   32 LVOT Area:     3.80 cm  RIGHT VENTRICLE RV S prime:     13.40 cm/s TAPSE (M-mode): 2.7 cm LEFT ATRIUM             Index       RIGHT ATRIUM           Index LA diam:        3.50 cm 1.45 cm/m  RA Area:     14.20 cm LA Vol (A2C):   46.7 ml 19.37 ml/m RA Volume:   36.50 ml  15.14 ml/m LA Vol (A4C):   59.2 ml 24.55 ml/m LA Biplane Vol: 55.7 ml 23.10 ml/m  AORTIC VALVE AV Area (Vmax):    3.28 cm AV Area (Vmean):   3.03 cm AV Area (VTI):     3.20 cm AV Vmax:           132.83 cm/s AV Vmean:          95.014 cm/s AV VTI:            0.244 m AV Peak Grad:      7.1 mmHg AV Mean Grad:      3.9 mmHg LVOT Vmax:         114.47 cm/s LVOT Vmean:        75.738 cm/s LVOT VTI:          0.205 m LVOT/AV VTI ratio: 0.84  AORTA Ao Root diam: 3.20 cm MITRAL VALVE MV Area (PHT): 3.42 cm    SHUNTS MV Decel Time: 222 msec    Systemic VTI:  0.21 m MV E velocity: 59.70 cm/s  Systemic Diam: 2.20 cm MV A velocity: 55.80 cm/s MV E/A ratio:  1.07 Carlyle Dolly MD Electronically signed by Carlyle Dolly MD Signature Date/Time: 07/28/2019/12:59:03 PM    Final    CUP PACEART REMOTE DEVICE CHECK  Result Date: 07/11/2019 [facility] summary report received. Battery status OK. Normal device function. No new symptom episodes, tachy episodes, brady, or pause episodes. One previously viewed and reviewed 2 minute episode of AF. Monthly summary reports and ROV/PRN Kathy Breach, RN, CCDS, [employer]   Microbiology: Recent Results (from the past 240 hour(s))  SARS  Coronavirus 2 by RT PCR (hospital order, performed in [facility] hospital lab) Nasopharyngeal Nasopharyngeal Swab     Status: None   Collection Time: 07/26/19  3:57 AM   Specimen: Nasopharyngeal Swab  Result Value Ref Range Status   SARS Coronavirus 2 NEGATIVE NEGATIVE Final    Comment: (NOTE) SARS-CoV-2 target  nucleic acids are NOT DETECTED.  The SARS-CoV-2 RNA is generally detectable in upper and lower respiratory specimens during the acute phase of infection. The lowest concentration of SARS-CoV-2 viral copies this assay can detect is 250 copies / mL. A negative result does not preclude SARS-CoV-2 infection and should not be used as the sole basis for treatment or other patient management decisions.  A negative result may occur with improper specimen collection / handling, submission of specimen other than nasopharyngeal swab, presence of viral mutation(s) within the areas targeted by this assay, and inadequate number of viral copies (<250 copies / mL). A negative result must be combined with clinical observations, patient history, and epidemiological information.  Fact Sheet for Patients:   StrictlyIdeas.no  Fact Sheet for Healthcare Providers: BankingDealers.co.za  This test is not yet approved or  cleared by the [place] FDA and has been authorized for detection and/or diagnosis of SARS-CoV-2 by FDA under an Emergency Use Authorization (EUA).  This EUA will remain in effect (meaning this test can be used) for the duration of the COVID-19 declaration under Section 564(b)(1) of the Act, 21 U.S.C. section 360bbb-3(b)(1), unless the authorization is terminated or revoked sooner.  Performed at [facility], [address] [place], [place] [address]   Blood culture (routine x 2)     Status: None (Preliminary result)   Collection Time: 07/26/19  4:14 AM   Specimen: BLOOD  Result Value Ref Range Status   Specimen Description  BLOOD LEFT ANTECUBITAL  Final   Special Requests   Final    BOTTLES DRAWN AEROBIC AND ANAEROBIC Blood Culture adequate volume   Culture   Final    NO GROWTH 4 DAYS Performed at [facility], [address], [place], [place] [address]    Report Status PENDING  Incomplete  Blood culture (routine x 2)     Status: None (Preliminary result)   Collection Time: 07/26/19  4:16 AM   Specimen: BLOOD LEFT HAND  Result Value Ref Range Status   Specimen Description BLOOD LEFT HAND  Final   Special Requests   Final    BOTTLES DRAWN AEROBIC AND ANAEROBIC Blood Culture adequate volume   Culture   Final    NO GROWTH 4 DAYS Performed at [facility], [address] [place], [place] [address]    Report Status PENDING  Incomplete  Gram stain     Status: None   Collection Time: 07/26/19 11:25 AM   Specimen: Ascitic; Body Fluid  Result Value Ref Range Status   Specimen Description ASCITIC  Final   Special Requests NONE  Final   Gram Stain   Final    CYTOSPIN SMEAR NO ORGANISMS SEEN WBC PRESENT, PREDOMINANTLY MONONUCLEAR Performed at [facility], [address], [place], [place] [address]    Report Status 07/26/2019 FINAL  Final  Culture, body fluid-bottle     Status: None (Preliminary result)   Collection Time: 07/26/19 11:25 AM   Specimen: Ascitic  Result Value Ref Range Status   Specimen Description ASCITIC  Final   Special Requests 10CC BOTTLES DRAWN AEROBIC AND ANAEROBIC  Final   Culture   Final    NO GROWTH 4 DAYS Performed at [facility], [address], [place],  [address]    Report Status PENDING  Incomplete     Labs: Basic Metabolic Panel: Recent Labs  Lab 07/26/19 0219 07/26/19 0219 07/26/19 0800 07/27/19 0424 07/28/19 0553 07/29/19 0819 07/30/19 0630  NA 137  --   --  139 136 138 139  K 4.1  --   --  4.1 3.7 3.9 3.8  CL 104  --   --  104 102 101 101  CO2 23  --   --  26 24 28 28   GLUCOSE 102*  --   --  86 75 91 85  BUN 10  --   --  7 8 10 11   CREATININE 1.34*   --   --  0.85 1.05 1.13 1.05  CALCIUM 8.0*   < > 7.6* 7.8* 8.3* 8.3* 8.5*  MG  --   --  2.0  --  1.7 2.1  --   PHOS  --   --  2.7  --  3.7 3.6  --    < > = values in this interval not displayed.   Liver Function Tests: Recent Labs  Lab 07/26/19 0219 07/27/19 0424 07/28/19 0553 07/29/19 0819 07/30/19 0630  AST 77* 59* 54* 66* 65*  ALT 29 24 24 23 23   ALKPHOS 166* 149* 141* 132* 126  BILITOT 1.1 1.4* 1.8* 1.3* 1.2  PROT 6.4* 6.0* 6.3* 6.3* 6.3*  ALBUMIN 2.8* 2.5* 2.7* 2.8* 2.8*   Recent Labs  Lab 07/26/19 0219  LIPASE 25   Recent Labs  Lab 07/26/19 0219  AMMONIA 42*   CBC: Recent Labs  Lab 07/26/19 0219 07/27/19 0424 07/28/19 0553 07/29/19 0819 07/30/19 0630  WBC 8.8 6.8 5.1 5.2 6.4  NEUTROABS 4.1  --   --   --   --   HGB 14.6 14.3 14.3 14.2 13.9  HCT 45.8 45.3 45.3 45.0 44.0  MCV 106.0* 106.1* 104.9* 105.4* 105.0*  PLT 235 167 155 151 148*   Cardiac Enzymes: No results for input(s): CKTOTAL, CKMB, CKMBINDEX, TROPONINI in the last 168 hours. BNP: BNP (last 3 results) Recent Labs    07/26/19 0220 07/26/19 0800  BNP 37.0 47.0    ProBNP (last 3 results) No results for input(s): PROBNP in the last 8760 hours.  CBG: Recent Labs  Lab 07/26/19 1159 07/27/19 0808 07/28/19 0734 07/29/19 0728 07/30/19 0723  GLUCAP 88 79 79 90 83       Signed:  Rodena Goldmann MD.  [facility] 07/30/2019, 12:01 PM

## 2019-07-29 NOTE — Plan of Care (Signed)

## 2019-07-29 NOTE — Progress Notes (Addendum)
Pt's HR sustained in upper 130-140s.  Provider notified.  Provider ordered an EKG.  EKG performed.  EKG: SVT, non specific ST abnormality Provider notified.  Patient states he has an internal heart monitor that is monitored remotely by Dr. [name].   2.5 mg Metoprolol ordered and given.  HR dropped to 80.  BP 100/68.  EKG was retaken.  EKG reads NSR.

## 2019-07-29 NOTE — Progress Notes (Signed)
  Subjective:  Patient is upset as he was not able to go home today. I explained to him why he was not able to do so. He states he has good appetite. He has been ambulating in the room without getting lightheaded or dizzy. He has not had a bowel movement today. He says he can go few days without having a bowel movement. He does not feel that his abdomen is any more distended than it was yesterday.  Objective: Blood pressure 102/75, pulse 87, temperature 98.4 F (36.9 C), temperature source Oral, resp. rate 18, height '5\' 10"'$  (1.778 m), weight 126.2 kg, SpO2 95 %. Patient is alert and in no acute distress. He does not have asterixis or tremors. Abdomen is distended but soft and nontender. He has trace pitting edema involving both ankles.  Labs/studies Results:  CBC Latest Ref Rng & Units 07/29/2019 07/28/2019 07/27/2019  WBC 4.0 - 10.5 K/uL 5.2 5.1 6.8  Hemoglobin 13.0 - 17.0 g/dL 14.2 14.3 14.3  Hematocrit 39 - 52 % 45.0 45.3 45.3  Platelets 150 - 400 K/uL 151 155 167    CMP Latest Ref Rng & Units 07/29/2019 07/28/2019 07/27/2019  Glucose 70 - 99 mg/dL 91 75 86  BUN 6 - 20 mg/dL '10 8 7  '$ Creatinine 0.61 - 1.24 mg/dL 1.13 1.05 0.85  Sodium 135 - 145 mmol/L 138 136 139  Potassium 3.5 - 5.1 mmol/L 3.9 3.7 4.1  Chloride 98 - 111 mmol/L 101 102 104  CO2 22 - 32 mmol/L '28 24 26  '$ Calcium 8.9 - 10.3 mg/dL 8.3(L) 8.3(L) 7.8(L)  Total Protein 6.5 - 8.1 g/dL 6.3(L) 6.3(L) 6.0(L)  Total Bilirubin 0.3 - 1.2 mg/dL 1.3(H) 1.8(H) 1.4(H)  Alkaline Phos 38 - 126 U/L 132(H) 141(H) 149(H)  AST 15 - 41 U/L 66(H) 54(H) 59(H)  ALT 0 - 44 U/L '23 24 24    '$ Hepatic Function Latest Ref Rng & Units 07/29/2019 07/28/2019 07/27/2019  Total Protein 6.5 - 8.1 g/dL 6.3(L) 6.3(L) 6.0(L)  Albumin 3.5 - 5.0 g/dL 2.8(L) 2.7(L) 2.5(L)  AST 15 - 41 U/L 66(H) 54(H) 59(H)  ALT 0 - 44 U/L '23 24 24  '$ Alk Phosphatase 38 - 126 U/L 132(H) 141(H) 149(H)  Total Bilirubin 0.3 - 1.2 mg/dL 1.3(H) 1.8(H) 1.4(H)  Bilirubin, Direct 0.0 - 0.2  mg/dL - - -    No results found for: CRP    Assessment:  Assessment:  #1.  New onset of ascites secondary to cirrhosis.  He underwent LVAP 3 days ago with removal of 4.2 L of fluid.  Patient begun on furosemide and spironolactone 2 days ago. He is responding to diuretic therapy. His ascites is not reaccumulating.  #2.  Cirrhosis.  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  He developed somewhat rapid decompensation at he had CT 3 months ago and did not have ascites. Serum albumin remains low but has leveled off.  #3.  Community-acquired pneumonia.  He does not have productive cough or fever.  Patient remains on Levaquin.  #4.  Nonsustained SVT.   Patient was given metoprolol 2.5 mg IV for rapid ventricular rate due to SVT. Dr. Florene Glen has been concerned about soft blood pressure. I suspect this is secondary to calcium channel blocker. If need be will back off on diuretic dose  Recommendations  Metabolic 7 in a.m.

## 2019-07-29 NOTE — Progress Notes (Addendum)
Called by nursing HR 140-150's.  EKG notable for SVT. Pt asymptomatic per nursing.  Vitals notable for low blood pressure, initially 58'I systolic, now improved to ~502 systolic. Discussed trial of vagal maneuvers.  If SVT does not break, consider IV metop 2.5 mg if SBP > 100.   May need further uptitration of diltiazem, though blood pressure limiting factor (may need to reduce lasix/spironolactone doses to give more room).  HR improved to 80's with 2.5 metop, back in sinus rhythm. Will increase diltiazem to 300 mg daily (give 30 mg q6 x 2 today and increase to 300 mg tomorrow). Given soft BP, will start midodrine.   Consider reducing dose of lasix/spironolactone, but follow with addition of midodrine first.   Cancel discharge today.  Hopefully may d/c tomorrow.

## 2019-07-30 DIAGNOSIS — Z7289 Other problems related to lifestyle: Secondary | ICD-10-CM

## 2019-07-30 DIAGNOSIS — I484 Atypical atrial flutter: Secondary | ICD-10-CM

## 2019-07-30 LAB — COMPREHENSIVE METABOLIC PANEL
ALT: 23 U/L (ref 0–44)
AST: 65 U/L — ABNORMAL HIGH (ref 15–41)
Albumin: 2.8 g/dL — ABNORMAL LOW (ref 3.5–5.0)
Alkaline Phosphatase: 126 U/L (ref 38–126)
Anion gap: 10 (ref 5–15)
BUN: 11 mg/dL (ref 6–20)
CO2: 28 mmol/L (ref 22–32)
Calcium: 8.5 mg/dL — ABNORMAL LOW (ref 8.9–10.3)
Chloride: 101 mmol/L (ref 98–111)
Creatinine, Ser: 1.05 mg/dL (ref 0.61–1.24)
GFR calc Af Amer: >60 mL/min (ref >60)
GFR calc non Af Amer: >60 mL/min (ref >60)
Glucose, Bld: 85 mg/dL (ref 70–99)
Potassium: 3.8 mmol/L (ref 3.5–5.1)
Sodium: 139 mmol/L (ref 135–145)
Total Bilirubin: 1.2 mg/dL (ref 0.3–1.2)
Total Protein: 6.3 g/dL — ABNORMAL LOW (ref 6.5–8.1)

## 2019-07-30 LAB — CBC
HCT: 44 % (ref 39.0–52.0)
Hemoglobin: 13.9 g/dL (ref 13.0–17.0)
MCH: 33.2 pg (ref 26.0–34.0)
MCHC: 31.6 g/dL (ref 30.0–36.0)
MCV: 105 fL — ABNORMAL HIGH (ref 80.0–100.0)
Platelets: 148 10*3/uL — ABNORMAL LOW (ref 150–400)
RBC: 4.19 MIL/uL — ABNORMAL LOW (ref 4.22–5.81)
RDW: 13.1 % (ref 11.5–15.5)
WBC: 6.4 10*3/uL (ref 4.0–10.5)
nRBC: 0 % (ref 0.0–0.2)

## 2019-07-30 LAB — GLUCOSE, CAPILLARY: Glucose-Capillary: 83 mg/dL (ref 70–99)

## 2019-07-30 MED ORDER — CHLORDIAZEPOXIDE HCL 10 MG PO CAPS: 10.0000 mg | ORAL_CAPSULE | Freq: Three times a day (TID) | ORAL | 0 refills | Status: DC | PRN

## 2019-07-30 MED ORDER — DILTIAZEM HCL ER COATED BEADS 300 MG PO CP24: 300.0000 mg | ORAL_CAPSULE | Freq: Every day | ORAL | 3 refills | Status: DC

## 2019-07-30 MED ORDER — FUROSEMIDE 20 MG PO TABS: 20.0000 mg | ORAL_TABLET | Freq: Every day | ORAL | 2 refills | Status: DC

## 2019-07-30 MED ORDER — MIDODRINE HCL 5 MG PO TABS: 5.0000 mg | ORAL_TABLET | Freq: Three times a day (TID) | ORAL | 2 refills | Status: AC

## 2019-07-30 MED ORDER — SPIRONOLACTONE 25 MG PO TABS
50.0000 mg | ORAL_TABLET | Freq: Every day | ORAL | Status: DC
  Administered 2019-07-30: 50 mg via ORAL

## 2019-07-30 MED ORDER — SPIRONOLACTONE 50 MG PO TABS: 50.0000 mg | ORAL_TABLET | Freq: Every day | ORAL | 2 refills | Status: DC

## 2019-07-30 MED ORDER — FUROSEMIDE 20 MG PO TABS
20.0000 mg | ORAL_TABLET | Freq: Every day | ORAL | Status: DC
  Administered 2019-07-30: 20 mg via ORAL

## 2019-07-30 NOTE — Progress Notes (Signed)
Nsg Discharge Note  Admit Date:  07/26/2019 Discharge date: 07/30/2019   [name] to be D/C'd home per MD order.  AVS completed.  Copy for chart, and copy for patient signed, and dated. Patient/caregiver able to verbalize understanding.  Discharge Medication: Allergies as of 07/30/2019      Reactions   Amitriptyline Anaphylaxis   Amoxicillin Anaphylaxis, Rash   Has patient had a PCN reaction causing immediate rash, facial/tongue/throat swelling, SOB or lightheadedness with hypotension: Yes Has patient had a PCN reaction causing severe rash involving mucus membranes or skin necrosis: No Has patient had a PCN reaction that required hospitalization: No Has patient had a PCN reaction occurring within the last 10 years: Yes If all of the above answers are "NO", then may proceed with Cephalosporin use.   Asa [aspirin] Anaphylaxis   Penicillin G Anaphylaxis   Clonidine Derivatives Other (See Comments)   Severe Dry Mouth   Hydrochlorothiazide    Caused rectal bleeding and hematuria   Levothyroxine Nausea And Vomiting   Patient states that the 75 mcg is too strong for him.   Trintellix [vortioxetine] Nausea And Vomiting      Medication List    STOP taking these medications   chlorthalidone 25 MG tablet Commonly known as: HYGROTON   diclofenac 75 MG EC tablet Commonly known as: VOLTAREN   lisinopril 20 MG tablet Commonly known as: ZESTRIL   POTASSIMIN PO     TAKE these medications   ALPRAZolam 1 MG tablet Commonly known as: XANAX Take 1 mg by mouth 2 (two) times daily as needed for anxiety.   chlordiazePOXIDE 10 MG capsule Commonly known as: LIBRIUM Take 1 capsule (10 mg total) by mouth 3 (three) times daily as needed for anxiety.   diltiazem 240 MG 24 hr capsule Commonly known as: CARDIZEM CD Take 1 capsule by mouth once daily What changed: Another medication with the same name was added. Make sure you understand how and when to take each.   diltiazem 300 MG 24 hr  capsule Commonly known as: CARDIZEM CD Take 1 capsule (300 mg total) by mouth daily. Start taking on: July 31, 2019 What changed: You were already taking a medication with the same name, and this prescription was added. Make sure you understand how and when to take each.   folic acid 1 MG tablet Commonly known as: FOLVITE Take 1 tablet (1 mg total) by mouth daily.   furosemide 20 MG tablet Commonly known as: LASIX Take 1 tablet (20 mg total) by mouth daily. Start taking on: July 31, 2019   levothyroxine 50 MCG tablet Commonly known as: SYNTHROID Take 1 tablet (50 mcg total) by mouth daily.   Lyrica 200 MG capsule Generic drug: pregabalin Take 400 mg by mouth daily.   MAGNESIA PO Take 1 tablet by mouth daily.   midodrine 5 MG tablet Commonly known as: PROAMATINE Take 1 tablet (5 mg total) by mouth 3 (three) times daily with meals.   ondansetron 4 MG tablet Commonly known as: Zofran Take 1 tablet (4 mg total) by mouth every 8 (eight) hours as needed for nausea or vomiting.   RED YEAST RICE PO Take by mouth in the morning and at bedtime.   spironolactone 50 MG tablet Commonly known as: ALDACTONE Take 1 tablet (50 mg total) by mouth daily. Start taking on: July 31, 2019   thiamine 100 MG tablet Take 1 tablet (100 mg total) by mouth daily.   Vitamin D3 1.25 MG (50000 UT)  Caps Take 1 capsule by mouth once a week.   zinc gluconate 50 MG tablet Take 50 mg by mouth every 30 (thirty) days. Take with vitamin d       Discharge Assessment: Vitals:   07/30/19 0817 07/30/19 1140  BP: 124/85 111/74  Pulse: 83 83  Resp:  18  Temp:  98.2 F (36.8 C)  SpO2:  97%   Skin clean, dry and intact without evidence of skin break down, no evidence of skin tears noted. IV catheter discontinued intact. Site without signs and symptoms of complications - no redness or edema noted at insertion site, patient denies c/o pain - only slight tenderness at site.  Dressing with slight pressure  applied.  D/c Instructions-Education: Discharge instructions given to patient/family with verbalized understanding. D/c education completed with patient/family including follow up instructions, medication list, d/c activities limitations if indicated, with other d/c instructions as indicated by MD - patient able to verbalize understanding, all questions fully answered. Patient instructed to return to ED, call 911, or call MD for any changes in condition.  Patient escorted via [place], and D/C home via private auto.  Atilano Ina, RN 07/30/2019 12:47 PM

## 2019-07-30 NOTE — Progress Notes (Addendum)
Patient seen and evaluated today.  Please refer to discharge summary dictated 07/29/2019 by Dr. Fayrene Helper which I have updated with his new medication dosages.  To summarize, patient will remain on increased dose of diltiazem 300 mg daily along with midodrine as prescribed.  Lasix dose has been decreased to 20 mg daily as well as Aldactone dose which has also been decreased to 50 mg daily.  He has also been given some Librium as needed for any alcohol withdrawal symptoms and will follow up with GI as well as other specialists as noted.  No other acute events noted overnight and his heart rate and blood pressures have remained stable.  Total care time: 20 minutes.

## 2019-07-30 NOTE — Progress Notes (Signed)
Subjective:  Patient has no complaints.  He has been ambulating in the room and outside not getting dizzy or lightheaded.  He denies nausea vomiting or abdominal pain.  He states he cannot bend and touch his feet but he could not do it when he came to the hospital. He is planning to see his cardiologist [name] on outpatient basis.  Objective: Blood pressure 111/74, pulse 83, temperature 98.2 F (36.8 C), temperature source Oral, resp. rate 18, height 5' 10"  (1.778 m), weight 125 kg, SpO2 97 %. Patient is alert and in no acute distress. Has mild tremors to his hands but no asterixis.. Abdomen is distended but soft and nontender. Trace edema around ankles.  Most of his edema appears to be nonpitting.  Labs/studies Results:   CBC Latest Ref Rng & Units 07/30/2019 07/29/2019 07/28/2019  WBC 4.0 - 10.5 K/uL 6.4 5.2 5.1  Hemoglobin 13.0 - 17.0 g/dL 13.9 14.2 14.3  Hematocrit 39 - 52 % 44.0 45.0 45.3  Platelets 150 - 400 K/uL 148(L) 151 155    CMP Latest Ref Rng & Units 07/30/2019 07/29/2019 07/28/2019  Glucose 70 - 99 mg/dL 85 91 75  BUN 6 - 20 mg/dL 11 10 8   Creatinine 0.61 - 1.24 mg/dL 1.05 1.13 1.05  Sodium 135 - 145 mmol/L 139 138 136  Potassium 3.5 - 5.1 mmol/L 3.8 3.9 3.7  Chloride 98 - 111 mmol/L 101 101 102  CO2 22 - 32 mmol/L 28 28 24   Calcium 8.9 - 10.3 mg/dL 8.5(L) 8.3(L) 8.3(L)  Total Protein 6.5 - 8.1 g/dL 6.3(L) 6.3(L) 6.3(L)  Total Bilirubin 0.3 - 1.2 mg/dL 1.2 1.3(H) 1.8(H)  Alkaline Phos 38 - 126 U/L 126 132(H) 141(H)  AST 15 - 41 U/L 65(H) 66(H) 54(H)  ALT 0 - 44 U/L 23 23 24     Hepatic Function Latest Ref Rng & Units 07/30/2019 07/29/2019 07/28/2019  Total Protein 6.5 - 8.1 g/dL 6.3(L) 6.3(L) 6.3(L)  Albumin 3.5 - 5.0 g/dL 2.8(L) 2.8(L) 2.7(L)  AST 15 - 41 U/L 65(H) 66(H) 54(H)  ALT 0 - 44 U/L 23 23 24   Alk Phosphatase 38 - 126 U/L 126 132(H) 141(H)  Total Bilirubin 0.3 - 1.2 mg/dL 1.2 1.3(H) 1.8(H)  Bilirubin, Direct 0.0 - 0.2 mg/dL - - -       Assessment:  Assessment:  #1.  New onset of ascites secondary to cirrhosis.    Patient underwent large-volume abdominal paracenteses at the time of admission 4 days ago.  Cultures were negative for bacterial infection.  He is now on furosemide and spironolactone.  Furosemide dose was decreased yesterday because of hypotension.  He is responding to diuretic therapy.  He has lost 3 pounds in the last 24 hours.  #2.  Cirrhosis.  He has history of nonalcoholic steatohepatitis and on top of that he has alcoholic hepatitis.  He now has developed cirrhosis by CT criterion.  He did not have CT evidence of cirrhosis or ascites on CT of 13 weeks ago.  Hopefully there would be complete recovery of hepatic function.  #3.  Community-acquired pneumonia.  He does not have productive cough or fever.  Patient remains on Levaquin.  #4.  Nonsustained SVT.    He is in normal sinus rhythm.  It does not appear that he has had another episode of SVT run.  #5. Hypotension.  Suspect hypotension is multifactorial i.e. diuretic therapy as well as calcium channel blockers.  Patient was begun on midodrine yesterday.  #6.  Alcohol abuse.  His alcohol level was over 250 at the time of admission.  He is not exhibiting any withdrawal symptoms.  Last chlordiazepoxide dose was 2 days ago.  Recommendations  Consider sending him home on few doses of chlordiazepoxide that he can use on as-needed basis. Will arrange for office visit in 2 to 3 weeks.  He will have blood work prior to that visit.  My office will contact patient.

## 2019-07-31 LAB — CULTURE, BODY FLUID W GRAM STAIN -BOTTLE: Culture: NO GROWTH

## 2019-07-31 LAB — CULTURE, BLOOD (ROUTINE X 2)
Culture: NO GROWTH
Culture: NO GROWTH
Special Requests: ADEQUATE
Special Requests: ADEQUATE

## 2019-08-03 ENCOUNTER — Telehealth (INDEPENDENT_AMBULATORY_CARE_PROVIDER_SITE_OTHER): Payer: Self-pay | Admitting: Internal Medicine

## 2019-08-03 NOTE — Telephone Encounter (Signed)
07/30/2019 Dr. Laural Golden last hospital note:  Will arrange for office visit in 2 to 3 weeks. (I do not know why Thayer Headings could not see as the follow up--I have given him an office visit for 1:15 on 08/18/2019)  He will have blood work prior to that visit.  My office will contact patient.

## 2019-08-03 NOTE — Telephone Encounter (Signed)
Patient called regarding a follow up appointment with Dr Laural Golden - please advise

## 2019-08-04 ENCOUNTER — Telehealth: Payer: Self-pay | Admitting: *Deleted

## 2019-08-04 NOTE — Telephone Encounter (Signed)
I will need to ask Dr.Rehman what Lab Work he is wanting patient to have. Patient's lab work is 08/18/2019.

## 2019-08-04 NOTE — Telephone Encounter (Signed)
Attempted to contact pt to complete transitions of care assessment; no answer; unable to leave message.  Lenor Coffin, RN, BSN, [place] [facility] [place] [phone]

## 2019-08-11 ENCOUNTER — Ambulatory Visit: Payer: Medicaid Other | Admitting: Family Medicine

## 2019-08-11 DIAGNOSIS — Z7689 Persons encountering health services in other specified circumstances: Secondary | ICD-10-CM | POA: Diagnosis not present

## 2019-08-11 DIAGNOSIS — R945 Abnormal results of liver function studies: Secondary | ICD-10-CM | POA: Diagnosis not present

## 2019-08-12 ENCOUNTER — Encounter: Payer: Self-pay | Admitting: Family

## 2019-08-12 ENCOUNTER — Ambulatory Visit (INDEPENDENT_AMBULATORY_CARE_PROVIDER_SITE_OTHER): Payer: Medicaid Other

## 2019-08-12 ENCOUNTER — Ambulatory Visit: Payer: Medicaid Other | Admitting: Family

## 2019-08-12 ENCOUNTER — Other Ambulatory Visit: Payer: Self-pay

## 2019-08-12 VITALS — BP 112/73 | HR 96 | Temp 96.2°F | Ht 70.0 in | Wt 251.2 lb

## 2019-08-12 DIAGNOSIS — E039 Hypothyroidism, unspecified: Secondary | ICD-10-CM

## 2019-08-12 DIAGNOSIS — Z7289 Other problems related to lifestyle: Secondary | ICD-10-CM

## 2019-08-12 DIAGNOSIS — I472 Ventricular tachycardia: Secondary | ICD-10-CM

## 2019-08-12 DIAGNOSIS — I10 Essential (primary) hypertension: Secondary | ICD-10-CM | POA: Diagnosis not present

## 2019-08-12 DIAGNOSIS — F339 Major depressive disorder, recurrent, unspecified: Secondary | ICD-10-CM | POA: Diagnosis not present

## 2019-08-12 DIAGNOSIS — Z789 Other specified health status: Secondary | ICD-10-CM

## 2019-08-12 DIAGNOSIS — H6123 Impacted cerumen, bilateral: Secondary | ICD-10-CM | POA: Diagnosis not present

## 2019-08-12 DIAGNOSIS — R188 Other ascites: Secondary | ICD-10-CM | POA: Diagnosis not present

## 2019-08-12 DIAGNOSIS — J690 Pneumonitis due to inhalation of food and vomit: Secondary | ICD-10-CM

## 2019-08-12 DIAGNOSIS — Z5329 Procedure and treatment not carried out because of patient's decision for other reasons: Secondary | ICD-10-CM | POA: Diagnosis not present

## 2019-08-12 DIAGNOSIS — Z91199 Patient's noncompliance with other medical treatment and regimen due to unspecified reason: Secondary | ICD-10-CM

## 2019-08-12 DIAGNOSIS — J189 Pneumonia, unspecified organism: Secondary | ICD-10-CM | POA: Diagnosis not present

## 2019-08-12 DIAGNOSIS — Z09 Encounter for follow-up examination after completed treatment for conditions other than malignant neoplasm: Secondary | ICD-10-CM | POA: Diagnosis not present

## 2019-08-12 DIAGNOSIS — I4729 Other ventricular tachycardia: Secondary | ICD-10-CM

## 2019-08-12 DIAGNOSIS — F1020 Alcohol dependence, uncomplicated: Secondary | ICD-10-CM

## 2019-08-12 LAB — HEPATIC FUNCTION PANEL
AG Ratio: 1 (calc) (ref 1.0–2.5)
ALT: 35 U/L (ref 9–46)
AST: 84 U/L — ABNORMAL HIGH (ref 10–35)
Albumin: 3.9 g/dL (ref 3.6–5.1)
Alkaline phosphatase (APISO): 151 U/L — ABNORMAL HIGH (ref 35–144)
Bilirubin, Direct: 0.2 mg/dL (ref 0.0–0.2)
Globulin: 4 g/dL (calc) — ABNORMAL HIGH (ref 1.9–3.7)
Indirect Bilirubin: 0.7 mg/dL (calc) (ref 0.2–1.2)
Total Bilirubin: 0.9 mg/dL (ref 0.2–1.2)
Total Protein: 7.9 g/dL (ref 6.1–8.1)

## 2019-08-12 MED ORDER — FUROSEMIDE 20 MG PO TABS: 20.0000 mg | ORAL_TABLET | Freq: Every day | ORAL | 2 refills | Status: DC

## 2019-08-12 MED ORDER — SPIRONOLACTONE 50 MG PO TABS: 50.0000 mg | ORAL_TABLET | Freq: Every day | ORAL | 2 refills | Status: DC

## 2019-08-12 NOTE — Progress Notes (Signed)
Subjective:    Patient ID: Earl [name], male    DOB: 11-04-1960, 59 y.o.   MRN: [id number]  Chief Complaint  Patient presents with  . Hospitalization Follow-up    HPI Pt presents to the office today for hospital follow up. Pt went to the ED on 07/26/19 with abdominal pain, abdominal distention, and intoxicated with alcohol. He was diagnosed with ascites. He had paracentesis and started on lasix/spironolactone.  He states he did not feel like these were helping so he "doubled" these and is currently taking Lasix 20 mg BID and spironolactone 100 mg daily.   He had pneumonia and was treated with antibiotics in the hospital. He denies any SOB or cough.   He also had an episode of NSVT and Cardiologists was consulted and was to follow up with outpatient. However, patient states he cancelled these appointments as he feels like "they jumped the gun with all the heart stuff".   He has follow up with GI on 08/18/19.   His TSH was 4.746, he has an appt with Endo at the end of this month.    Review of Systems  Gastrointestinal: Positive for abdominal pain.  All other systems reviewed and are negative.      Objective:   Physical Exam Vitals reviewed.  Constitutional:      General: He is not in acute distress.    Appearance: He is well-developed.  HENT:     Head: Normocephalic.     Right Ear: Tympanic membrane normal.     Left Ear: Tympanic membrane normal.  Eyes:     General:        Right eye: No discharge.        Left eye: No discharge.     Pupils: Pupils are equal, round, and reactive to light.  Neck:     Thyroid: No thyromegaly.  Cardiovascular:     Rate and Rhythm: Normal rate and regular rhythm.     Heart sounds: Normal heart sounds. No murmur heard.   Pulmonary:     Effort: Pulmonary effort is normal. No respiratory distress.     Breath sounds: Normal breath sounds. No wheezing.  Abdominal:     General: Bowel sounds are normal. There is distension.     Palpations:  Abdomen is soft.     Tenderness: There is abdominal tenderness.  Musculoskeletal:        General: No tenderness. Normal range of motion.     Cervical back: Normal range of motion and neck supple.  Skin:    General: Skin is warm and dry.     Coloration: Skin is pale.     Findings: No erythema or rash.  Neurological:     Mental Status: He is alert and oriented to person, place, and time.     Cranial Nerves: No cranial nerve deficit.     Deep Tendon Reflexes: Reflexes are normal and symmetric.  Psychiatric:        Behavior: Behavior normal.        Thought Content: Thought content normal.        Judgment: Judgment normal.     Bilateral ears washed with warm water and peroxide, pt tolerated well. TM WNL  BP 112/73   Pulse 96   Temp (!) 96.2 F (35.7 C) (Temporal)   Ht 5' 10"  (1.778 m)   Wt 251 lb 3.2 oz (113.9 kg)   BMI 36.04 kg/m      Assessment & Plan:  Princess Bruins  [place] comes in today with chief complaint of Hospitalization Follow-up   Diagnosis and orders addressed:  1. Essential hypertension - CBC with Differential/Platelet - BMP8+EGFR  2. NSVT (nonsustained ventricular tachycardia) (HCC) - CBC with Differential/Platelet - BMP8+EGFR  3. Aspiration pneumonia, unspecified aspiration pneumonia type, unspecified laterality, unspecified part of lung ([place] - CBC with Differential/Platelet - BMP8+EGFR - DG Chest 2 View; Future  4. Acquired hypothyroidism - CBC with Differential/Platelet - BMP8+EGFR  5. Alcohol [place] - CBC with Differential/Platelet - BMP8+EGFR  6. Depression, recurrent ([place]) - CBC with Differential/Platelet - BMP8+EGFR  7. ETOHism (HCC) - CBC with Differential/Platelet - BMP8+EGFR  8. Morbid obesity ([place]) - CBC with Differential/Platelet - BMP8+EGFR  9. Noncompliance by refusing service - CBC with Differential/Platelet - BMP8+EGFR  10. Other ascites - CBC with Differential/Platelet - BMP8+EGFR - spironolactone (ALDACTONE) 50 MG tablet;  Take 1 tablet (50 mg total) by mouth daily.  Dispense: 30 tablet; Refill: 2 - furosemide (LASIX) 20 MG tablet; Take 1 tablet (20 mg total) by mouth daily.  Dispense: 30 tablet; Refill: 2  11. Hospital discharge follow-up - CBC with Differential/Platelet - BMP8+EGFR - DG Chest 2 View; Future  12. Bilateral impacted cerumem - CBC with Differential/Platelet [facility] notes reviewed [facility] appt Discussed that I could not write controlled medications today.  Continue weigh  Labs pending Health Maintenance reviewed Diet and exercise encouraged  Follow up plan: 3 months    Evelina Dun, FNP

## 2019-08-12 NOTE — Patient Instructions (Signed)
Ascites  Ascites is a collection of too much fluid in the abdomen. Ascites can range from mild to severe. If ascites is not treated, it can get worse. What are the causes? This condition may be caused by:  A liver condition called cirrhosis. This is the most common cause of ascites.  Long-term (chronic) or alcoholic hepatitis.  Infection or inflammation in the abdomen.  Cancer in the abdomen.  Heart failure.  Kidney disease.  Inflammation of the pancreas.  Clots in the veins of the liver. What are the signs or symptoms? Symptoms of this condition include:  A feeling of fullness in the abdomen. This is common.  An increase in the size of the abdomen or waist.  Swelling in the legs.  Swelling of the scrotum (in men).  Difficulty breathing.  Pain in the abdomen.  Sudden weight gain. If the condition is mild, you may not have symptoms. How is this diagnosed? This condition is diagnosed based on your medical history and a physical exam. Your health care provider may order imaging tests, such as an ultrasound or CT scan of your abdomen. How is this treated? Treatment for this condition depends on the cause of the ascites. It may include:  Taking a pill to make you urinate. This is called a water pill (diuretic pill).  Strictly reducing your salt (sodium) intake. Salt can cause extra fluid to be kept (retained) in the body, and this makes ascites worse.  Having a procedure to remove fluid from your abdomen (paracentesis).  Having a procedure that connects two of the major veins within your liver and relieves pressure on your liver. This is called a TIPS procedure (transjugular intrahepatic portosystemic shunt procedure).  Placement of a drainage catheter (peritoneovenous shunt) to manage the extra fluid in the abdomen. Ascites may go away or improve when the condition that caused it is treated. Follow these instructions at home:  Keep track of your weight. To do this,  weigh yourself at the same time every day and write down your weight.  Keep track of how much you drink and any changes in how much or how often you urinate.  Follow any instructions that your health care provider gives you about how much to drink.  Try not to eat salty (high-sodium) foods.  Take over-the-counter and prescription medicines only as told by your health care provider.  Keep all follow-up visits as told by your health care provider. This is important.  Report any changes in your health to your health care provider, especially if you develop new symptoms or your symptoms get worse. Contact a health care provider if:  You gain more than 3 lb (1.36 kg) in 3 days.  Your waist size increases.  You have new swelling in your legs.  The swelling in your legs gets worse. Get help right away if:  You have a fever.  You are confused.  You have new or worsening breathing trouble.  You have new or worsening pain in your abdomen.  You have new or worsening swelling in the scrotum (in men). Summary  Ascites is a collection of too much fluid in the abdomen.  Ascites may be caused by various conditions, such as cirrhosis, hepatitis, cancer, or congestive heart failure.  Symptoms may include swelling of the abdomen and other areas due to extra fluid in the body.  Treatments may involve dietary changes, medicines, or procedures. This information is not intended to replace advice given to you by your health care   provider. Make sure you discuss any questions you have with your health care provider. Document Revised: 12/15/2017 Document Reviewed: 09/25/2016 Elsevier Patient Education  2020 [employer]  

## 2019-08-13 LAB — CBC WITH DIFFERENTIAL/PLATELET
Basophils Absolute: 0.1 10*3/uL (ref 0.0–0.2)
Basos: 1 %
EOS (ABSOLUTE): 0.3 10*3/uL (ref 0.0–0.4)
Eos: 3 %
Hematocrit: 46.7 % (ref 37.5–51.0)
Hemoglobin: 15.8 g/dL (ref 13.0–17.7)
Immature Grans (Abs): 0 10*3/uL (ref 0.0–0.1)
Immature Granulocytes: 0 %
Lymphocytes Absolute: 1.8 10*3/uL (ref 0.7–3.1)
Lymphs: 20 %
MCH: 32.4 pg (ref 26.6–33.0)
MCHC: 33.8 g/dL (ref 31.5–35.7)
MCV: 96 fL (ref 79–97)
Monocytes Absolute: 0.8 10*3/uL (ref 0.1–0.9)
Monocytes: 9 %
Neutrophils Absolute: 6 10*3/uL (ref 1.4–7.0)
Neutrophils: 67 %
Platelets: 256 10*3/uL (ref 150–450)
RBC: 4.88 x10E6/uL (ref 4.14–5.80)
RDW: 11.5 % — ABNORMAL LOW (ref 11.6–15.4)
WBC: 8.9 10*3/uL (ref 3.4–10.8)

## 2019-08-13 LAB — BMP8+EGFR
BUN/Creatinine Ratio: 10 (ref 10–24)
BUN: 15 mg/dL (ref 8–27)
CO2: 25 mmol/L (ref 20–29)
Calcium: 9.5 mg/dL (ref 8.6–10.2)
Chloride: 103 mmol/L (ref 96–106)
Creatinine, Ser: 1.57 mg/dL — ABNORMAL HIGH (ref 0.76–1.27)
GFR calc Af Amer: 55 mL/min/{1.73_m2} — ABNORMAL LOW (ref >59)
GFR calc non Af Amer: 47 mL/min/{1.73_m2} — ABNORMAL LOW (ref >59)
Glucose: 108 mg/dL — ABNORMAL HIGH (ref 65–99)
Potassium: 4.9 mmol/L (ref 3.5–5.2)
Sodium: 142 mmol/L (ref 134–144)

## 2019-08-15 ENCOUNTER — Other Ambulatory Visit: Payer: Self-pay | Admitting: Family

## 2019-08-15 ENCOUNTER — Encounter: Payer: Self-pay | Admitting: Urology

## 2019-08-15 ENCOUNTER — Ambulatory Visit (INDEPENDENT_AMBULATORY_CARE_PROVIDER_SITE_OTHER): Payer: Medicaid Other | Admitting: *Deleted

## 2019-08-15 ENCOUNTER — Other Ambulatory Visit: Payer: Self-pay

## 2019-08-15 ENCOUNTER — Ambulatory Visit (INDEPENDENT_AMBULATORY_CARE_PROVIDER_SITE_OTHER): Payer: Medicaid Other | Admitting: Urology

## 2019-08-15 VITALS — BP 110/69 | HR 84 | Temp 97.0°F | Ht 70.0 in | Wt 248.0 lb

## 2019-08-15 DIAGNOSIS — R319 Hematuria, unspecified: Secondary | ICD-10-CM | POA: Diagnosis not present

## 2019-08-15 DIAGNOSIS — R002 Palpitations: Secondary | ICD-10-CM | POA: Diagnosis not present

## 2019-08-15 DIAGNOSIS — N200 Calculus of kidney: Secondary | ICD-10-CM | POA: Diagnosis not present

## 2019-08-15 DIAGNOSIS — Z7689 Persons encountering health services in other specified circumstances: Secondary | ICD-10-CM | POA: Diagnosis not present

## 2019-08-15 LAB — POCT URINALYSIS DIPSTICK
Bilirubin, UA: NEGATIVE
Blood, UA: NEGATIVE
Glucose, UA: NEGATIVE
Ketones, UA: NEGATIVE
Leukocytes, UA: NEGATIVE
Nitrite, UA: NEGATIVE
Protein, UA: NEGATIVE
Spec Grav, UA: 1.01 (ref 1.010–1.025)
Urobilinogen, UA: 0.2 E.U./dL
pH, UA: 6.5 (ref 5.0–8.0)

## 2019-08-15 LAB — CUP PACEART REMOTE DEVICE CHECK
Date Time Interrogation Session: 20210718231721
Implantable Pulse Generator Implant Date: 20201228

## 2019-08-15 NOTE — Patient Instructions (Signed)

## 2019-08-15 NOTE — Progress Notes (Signed)
Urological Symptom Review  Patient is experiencing the following symptoms: Erection problems (male only)  Kidney stones  Review of Systems  Gastrointestinal (upper)  : Negative for upper GI symptoms  Gastrointestinal (lower) : Constipation  Constitutional : Negative for symptoms  Skin: Negative for skin symptoms  Eyes: Negative for eye symptoms  Ear/Nose/Throat : Negative for Ear/Nose/Throat symptoms  Hematologic/Lymphatic: Negative for Hematologic/Lymphatic symptoms  Cardiovascular : Negative for cardiovascular symptoms  Respiratory : Negative for respiratory symptoms  Endocrine: Negative for endocrine symptoms  Musculoskeletal: Joint pain  Neurological: Negative for neurological symptoms  Psychologic: Depression Anxiety

## 2019-08-15 NOTE — Progress Notes (Signed)
08/15/2019 1:34 PM   [name] 1960-04-30 [id number]  Referring provider: Sharion Balloon, [address] [place],  [place] [address]  Renal cysts and hx nephrolithiasis  HPI: Mr Acevedo is a 59yo with a hx of nephrolithiasis here for evaluation of renal cysts and nephrolithiasis. He underwent CT on 07/26/2019 which showed a 48mm right mid pole calculus. He denies any right flank pain. No significant LUTS. No gross hematuria. UA is normal today. His last stone event was over 10 years ago and was treated with ESWL. No family hx of nephrolithiasis. CT also showed small simple renal cysts bilaterally.    PMH: Past Medical History:  Diagnosis Date  . Alcohol abuse   . Anxiety   . Arthritis   . Colon cancer ([place])   . Depression   . Gall stones   . Heart burn   . Hyperlipidemia   . Hypertension   . Hypothyroidism   . Kidney stone   . Liver disease   . Tachycardia   . Typical atrial flutter [facility])     Surgical History: Past Surgical History:  Procedure Laterality Date  . A-FLUTTER ABLATION N/A 07/31/2016   Procedure: A-Flutter Ablation;  Surgeon: [name], MD;  Location: [facility];  Service: Cardiovascular;  Laterality: N/A;  . COLONOSCOPY N/A 04/17/2016   Procedure: COLONOSCOPY;  Surgeon: [name], MD;  [facility];  Service: Endoscopy;  Laterality: N/A;  1200  . COLONOSCOPY N/A 05/07/2017   Procedure: COLONOSCOPY;  Surgeon: [name], MD;  [facility];  Service: Endoscopy;  Laterality: N/A;  200  . implantable loop recorder placement  01/24/2019    Medtronic Reveal Linq model LNQ 11 implantable loop recorder (SN [id number] ) by Dr Rayann Heman for evaluation of palpitations  . MINOR CARPAL TUNNEL     right  . POLYPECTOMY  04/17/2016   Procedure: POLYPECTOMY;  Surgeon: [name], MD;  [facility]: [facility];  Service: Endoscopy;;  sigmoid    Home Medications:  Allergies as of 08/15/2019      Reactions    Amitriptyline Anaphylaxis   Amoxicillin Anaphylaxis, Rash   Has patient had a PCN reaction causing immediate rash, facial/tongue/throat swelling, SOB or lightheadedness with hypotension: Yes Has patient had a PCN reaction causing severe rash involving mucus membranes or skin necrosis: No Has patient had a PCN reaction that required hospitalization: No Has patient had a PCN reaction occurring within the last 10 years: Yes If all of the above answers are "NO", then may proceed with Cephalosporin use.   Asa [aspirin] Anaphylaxis   Penicillin G Anaphylaxis   Clonidine Derivatives Other (See Comments)   Severe Dry Mouth   Hydrochlorothiazide    Caused rectal bleeding and hematuria   Levothyroxine Nausea And Vomiting   Patient states that the 75 mcg is too strong for him.   Trintellix [vortioxetine] Nausea And Vomiting      Medication List       Accurate as of August 15, 2019  1:34 PM. If you have any questions, ask your nurse or doctor.        ALPRAZolam 1 MG tablet Commonly known as: XANAX Take 1 mg by mouth 2 (two) times daily as needed for anxiety.   chlordiazePOXIDE 10 MG capsule Commonly known as: LIBRIUM Take 1 capsule (10 mg total) by mouth 3 (three) times daily as needed for anxiety.   chlorthalidone 25 MG tablet Commonly known as: HYGROTON chlorthalidone 25  mg tablet  TAKE 1 TABLET BY MOUTH ONCE DAILY   diclofenac 75 MG EC tablet Commonly known as: VOLTAREN Take 1 tablet (75 mg total) by mouth 2 (two) times daily. What changed: when to take this   diltiazem 240 MG 24 hr capsule Commonly known as: TIAZAC diltiazem CD 240 mg capsule,extended release 24 hr  TAKE 1 CAPSULE BY MOUTH ONCE DAILY   diltiazem 300 MG 24 hr capsule Commonly known as: CARDIZEM CD Take 1 capsule (300 mg total) by mouth daily.   ergocalciferol 1.25 MG (50000 UT) capsule Commonly known as: VITAMIN D2 Vitamin D2 1,250 mcg (50,000 unit) capsule  TAKE 1 CAPSULE BY MOUTH EVERY WEEK   folic  acid 1 MG tablet Commonly known as: FOLVITE Take 1 tablet (1 mg total) by mouth daily.   furosemide 20 MG tablet Commonly known as: LASIX Take 1 tablet (20 mg total) by mouth daily.   levothyroxine 50 MCG tablet Commonly known as: SYNTHROID Take 1 tablet (50 mcg total) by mouth daily.   lisinopril 20 MG tablet Commonly known as: ZESTRIL lisinopril 20 mg tablet  TAKE 1 TABLET BY MOUTH ONCE DAILY   Lyrica 200 MG capsule Generic drug: pregabalin Take 400 mg by mouth daily.   MAGNESIA PO Take 1 tablet by mouth daily.   midodrine 5 MG tablet Commonly known as: PROAMATINE Take 1 tablet (5 mg total) by mouth 3 (three) times daily with meals.   ondansetron 4 MG tablet Commonly known as: Zofran Take 1 tablet (4 mg total) by mouth every 8 (eight) hours as needed for nausea or vomiting.   pantoprazole 40 MG tablet Commonly known as: PROTONIX pantoprazole 40 mg tablet,delayed release  TAKE 1 TABLET BY MOUTH ONCE DAILY BEFORE BREAKFAST   RED YEAST RICE PO Take by mouth in the morning and at bedtime.   spironolactone 50 MG tablet Commonly known as: ALDACTONE Take 1 tablet (50 mg total) by mouth daily.   thiamine 100 MG tablet Take 1 tablet (100 mg total) by mouth daily.   Trintellix 10 MG Tabs tablet Generic drug: vortioxetine HBr Trintellix 10 mg tablet  TAKE 1 TABLET BY MOUTH DAILY   Vitamin D3 1.25 MG (50000 UT) Caps Take 1 capsule by mouth once a week.   zinc gluconate 50 MG tablet Take 50 mg by mouth every 30 (thirty) days. Take with vitamin d       Allergies:  Allergies  Allergen Reactions  . Amitriptyline Anaphylaxis  . Amoxicillin Anaphylaxis and Rash    Has patient had a PCN reaction causing immediate rash, facial/tongue/throat swelling, SOB or lightheadedness with hypotension: Yes Has patient had a PCN reaction causing severe rash involving mucus membranes or skin necrosis: No Has patient had a PCN reaction that required hospitalization: No Has patient  had a PCN reaction occurring within the last 10 years: Yes If all of the above answers are "NO", then may proceed with Cephalosporin use.   Diona Fanti [Aspirin] Anaphylaxis  . Penicillin G Anaphylaxis  . Clonidine Derivatives Other (See Comments)    Severe Dry Mouth  . Hydrochlorothiazide     Caused rectal bleeding and hematuria  . Levothyroxine Nausea And Vomiting    Patient states that the 75 mcg is too strong for him.  Rennis Harding [Vortioxetine] Nausea And Vomiting    Family History: Family History  Problem Relation Age of Onset  . Alzheimer's disease Mother   . Heart disease Father   . Coronary artery disease Father   .  Liver disease Father   . Coronary artery disease Paternal Grandfather     Social History:  reports that he quit smoking about 40 years ago. He has never used smokeless tobacco. He reports previous alcohol use of about 8.0 standard drinks of alcohol per week. He reports that he does not use drugs.  ROS: All other review of systems were reviewed and are negative except what is noted above in HPI  Physical Exam: BP 110/69   Pulse 84   Temp (!) 97 F (36.1 C)   Ht 5\' 10"  (1.778 m)   Wt 248 lb (112.5 kg)   BMI 35.58 kg/m   Constitutional:  Alert and oriented, No acute distress. HEENT: [place] AT, moist mucus membranes.  Trachea midline, no masses. Cardiovascular: No clubbing, cyanosis, or edema. Respiratory: Normal respiratory effort, no increased work of breathing. GI: Abdomen is soft, nontender, nondistended, no abdominal masses GU: No CVA tenderness.  Lymph: No cervical or inguinal lymphadenopathy. Skin: No rashes, bruises or suspicious lesions. Neurologic: Grossly intact, no focal deficits, moving all 4 extremities. Psychiatric: Normal mood and affect.  Laboratory Data: Lab Results  Component Value Date   WBC 8.9 08/12/2019   HGB 15.8 08/12/2019   HCT 46.7 08/12/2019   MCV 96 08/12/2019   PLT 256 08/12/2019    Lab Results  Component Value Date    CREATININE 1.57 (H) 08/12/2019    No results found for: PSA  No results found for: TESTOSTERONE  Lab Results  Component Value Date   HGBA1C 4.5 (L) 07/26/2019    Urinalysis    Component Value Date/Time   COLORURINE YELLOW 07/26/2019 0219   APPEARANCEUR CLEAR 07/26/2019 0219   APPEARANCEUR Clear 06/06/2019 1214   LABSPEC 1.010 07/26/2019 0219   PHURINE 5.0 07/26/2019 0219   GLUCOSEU NEGATIVE 07/26/2019 0219   HGBUR NEGATIVE 07/26/2019 0219   BILIRUBINUR neg 08/15/2019 1311   BILIRUBINUR Negative 06/06/2019 1214   KETONESUR NEGATIVE 07/26/2019 0219   PROTEINUR Negative 08/15/2019 1311   PROTEINUR NEGATIVE 07/26/2019 0219   UROBILINOGEN 0.2 08/15/2019 1311   UROBILINOGEN 1.0 10/19/2008 1257   NITRITE neg 08/15/2019 1311   NITRITE NEGATIVE 07/26/2019 0219   LEUKOCYTESUR Negative 08/15/2019 1311   LEUKOCYTESUR NEGATIVE 07/26/2019 0219    Lab Results  Component Value Date   LABMICR See below: 06/06/2019   WBCUA 0-5 06/06/2019   RBCUA None seen 11/30/2015   LABEPIT None seen 06/06/2019   MUCUS Present 06/06/2019   BACTERIA None seen 06/06/2019    Pertinent Imaging: CT 6/29: Images reviewed and discussed with the patient No results found for this or any previous visit.  No results found for this or any previous visit.  No results found for this or any previous visit.  No results found for this or any previous visit.  No results found for this or any previous visit.  No results found for this or any previous visit.  No results found for this or any previous visit.  No results found for this or any previous visit.   Assessment & Plan:    1. Nephrolithiasis --We discussed the management of kidney stones. These options include observation, ureteroscopy, shockwave lithotripsy (ESWL) and percutaneous nephrolithotomy (PCNL). We discussed which options are relevant to the patient's stone(s). We discussed the natural history of kidney stones as well as the  complications of untreated stones and the impact on quality of life without treatment as well as with each of the above listed treatments. We also discussed the efficacy of  each treatment in its ability to clear the stone burden. With any of these management options I discussed the signs and symptoms of infection and the need for emergent treatment should these be experienced. For each option we discussed the ability of each procedure to clear the patient of their stone burden.   For observation I described the risks which include but are not limited to silent renal damage, life-threatening infection, need for emergent surgery, failure to pass stone and pain.   For ureteroscopy I described the risks which include bleeding, infection, damage to contiguous structures, positioning injury, ureteral stricture, ureteral avulsion, ureteral injury, need for prolonged ureteral stent, inability to perform ureteroscopy, need for an interval procedure, inability to clear stone burden, stent discomfort/pain, heart attack, stroke, pulmonary embolus and the inherent risks with general anesthesia.   For shockwave lithotripsy I described the risks which include arrhythmia, kidney contusion, kidney hemorrhage, need for transfusion, pain, inability to adequately break up stone, inability to pass stone fragments, Steinstrasse, infection associated with obstructing stones, need for alternate surgical procedure, need for repeat shockwave lithotripsy, MI, CVA, PE and the inherent risks with anesthesia/conscious sedation.   For PCNL I described the risks including positioning injury, pneumothorax, hydrothorax, need for chest tube, inability to clear stone burden, renal laceration, arterial venous fistula or malformation, need for embolization of kidney, loss of kidney or renal function, need for repeat procedure, need for prolonged nephrostomy tube, ureteral avulsion, MI, CVA, PE and the inherent risks of general anesthesia.   -  The patient would like to proceed with observation. RTC 6 months with KUB - POCT urinalysis dipstick   No follow-ups on file.  Nicolette Bang, MD  [facility] [place]

## 2019-08-17 NOTE — Progress Notes (Signed)
Carelink Summary Report / Loop Recorder 

## 2019-08-18 ENCOUNTER — Telehealth: Payer: Self-pay | Admitting: Family

## 2019-08-18 ENCOUNTER — Encounter (INDEPENDENT_AMBULATORY_CARE_PROVIDER_SITE_OTHER): Payer: Self-pay | Admitting: Gastroenterology

## 2019-08-18 ENCOUNTER — Other Ambulatory Visit: Payer: Self-pay

## 2019-08-18 ENCOUNTER — Ambulatory Visit (INDEPENDENT_AMBULATORY_CARE_PROVIDER_SITE_OTHER): Payer: Medicaid Other | Admitting: Gastroenterology

## 2019-08-18 VITALS — BP 107/73 | HR 80 | Temp 98.9°F | Ht 70.0 in | Wt 242.0 lb

## 2019-08-18 DIAGNOSIS — K7030 Alcoholic cirrhosis of liver without ascites: Secondary | ICD-10-CM

## 2019-08-18 DIAGNOSIS — Z7689 Persons encountering health services in other specified circumstances: Secondary | ICD-10-CM | POA: Diagnosis not present

## 2019-08-18 DIAGNOSIS — R945 Abnormal results of liver function studies: Secondary | ICD-10-CM | POA: Diagnosis not present

## 2019-08-18 DIAGNOSIS — R7989 Other specified abnormal findings of blood chemistry: Secondary | ICD-10-CM

## 2019-08-18 LAB — COMPLETE METABOLIC PANEL WITH GFR
AG Ratio: 1.1 (calc) (ref 1.0–2.5)
ALT: 48 U/L — ABNORMAL HIGH (ref 9–46)
AST: 78 U/L — ABNORMAL HIGH (ref 10–35)
Albumin: 4.5 g/dL (ref 3.6–5.1)
Alkaline phosphatase (APISO): 151 U/L — ABNORMAL HIGH (ref 35–144)
BUN/Creatinine Ratio: 10 (calc) (ref 6–22)
BUN: 14 mg/dL (ref 7–25)
CO2: 29 mmol/L (ref 20–32)
Calcium: 9.9 mg/dL (ref 8.6–10.3)
Chloride: 98 mmol/L (ref 98–110)
Creat: 1.47 mg/dL — ABNORMAL HIGH (ref 0.70–1.25)
GFR, Est African American: 59 mL/min/{1.73_m2} — ABNORMAL LOW (ref >=60)
GFR, Est Non African American: 51 mL/min/{1.73_m2} — ABNORMAL LOW (ref >=60)
Globulin: 4 g/dL (calc) — ABNORMAL HIGH (ref 1.9–3.7)
Glucose, Bld: 108 mg/dL (ref 65–139)
Potassium: 4.6 mmol/L (ref 3.5–5.3)
Sodium: 137 mmol/L (ref 135–146)
Total Bilirubin: 1.2 mg/dL (ref 0.2–1.2)
Total Protein: 8.5 g/dL — ABNORMAL HIGH (ref 6.1–8.1)

## 2019-08-18 NOTE — Progress Notes (Signed)
Patient profile: Earl [name] is a 59 y.o. male seen for hospital f/up   Admitted on July 27, 2019 for new onset ascites, history of fatty liver and had decompensation due to heavy alcohol use.  He previously had a CT in March 2021 without any evidence of morphologically of cirrhosis, also had no ascites on that CT.  He was discharged on 07/30/19 on Lasix 40 and spironolactone 140m  History of Present Illness: Earl PARISEAUis seen today and reports he is feeling great since discharge-he reports a great appetite but is watching his salt intake.  He denies any nausea, vomiting, GERD, epigastric pain.  If he takes Voltaren it causes abdominal pain - located in lower abd, stopped Voltaren and lower abd pain resolved. Last dose was 1 week ago. Having a BM every other day which is normal baseline (reports chronic constipation). No blood in stool and dark stools.   Currently on spironolactone 1029mand lasix 4061maily.  He denies any alcohol intake since hospital discharge.  Wt Readings from Last 3 Encounters:  08/18/19 (!) 242 lb (109.8 kg)  08/15/19 248 lb (112.5 kg)  08/12/19 251 lb 3.2 oz (113.9 kg)   per patient weight prior to admission was #280  Last Colonoscopy: 2019-removal of 2 small tubular adenomas, due for repeat 2022 Last Endoscopy: None prior   Past Medical History:  Past Medical History:  Diagnosis Date  . Alcohol abuse   . Anxiety   . Arthritis   . Colon cancer ([place]CC[place] . Depression   . Gall stones   . Heart burn   . Hyperlipidemia   . Hypertension   . Hypothyroidism   . Kidney stone   . Liver disease   . Tachycardia   . Typical atrial flutter (HCC[place]   Problem List: Patient Active Problem List   Diagnosis Date Noted  . SVT (supraventricular tachycardia) ([place] . NSVT (nonsustained ventricular tachycardia) (HC[place] . Pneumonia 07/26/2019  . PNA (pneumonia) 07/26/2019  . Lactic acidosis 07/26/2019  . Other ascites 07/26/2019  . Upper abdominal pain,  unspecified 03/31/2019  . Nausea without vomiting 03/31/2019  . Noncompliance by refusing service 03/02/2019  . ETOHism ([place]CC[place]1/08/2019  . Depression, recurrent (HCC[place]2/09/2018  . Morbid obesity (HCCVan9/16/2020  . Mixed hyperlipidemia 08/17/2018  . Vitamin D deficiency 08/17/2018  . Acquired hypothyroidism 08/17/2018  . Abnormal LFTs 07/29/2018  . Numbness 05/19/2017  . History of alcohol abuse 05/19/2017  . History of colon cancer 04/16/2017  . Palpitations 03/09/2017  . Atrial flutter ([place]CC[place]2/11/2017  . Family hx of colon cancer 02/04/2016  . Alcohol use 11/30/2015  . Hematuria 11/30/2015  . Hematochezia 11/30/2015  . Family history of colon cancer 09/19/2015  . Neuropathy 08/30/2015  . Rectal bleeding 08/30/2015  . Essential hypertension 08/30/2015  . Abnormal brain MRI 08/30/2015  . Vitamin B 12 deficiency 08/30/2015    Past Surgical History: Past Surgical History:  Procedure Laterality Date  . A-FLUTTER ABLATION N/A 07/31/2016   Procedure: A-Flutter Ablation;  Surgeon: AllThompson GrayerD;  Location: [facility];  Service: Cardiovascular;  Laterality: N/A;  . COLONOSCOPY N/A 04/17/2016   Procedure: COLONOSCOPY;  Surgeon: NajRogene HoustonD;  [facility];  Service: Endoscopy;  Laterality: N/A;  1200  . COLONOSCOPY N/A 05/07/2017   Procedure: COLONOSCOPY;  Surgeon: RehRogene HoustonD;  [facility];  Service: Endoscopy;  Laterality: N/A;  200  .  implantable loop recorder placement  01/24/2019    Medtronic Reveal Linq model LNQ 11 implantable loop recorder (SN [id number] ) by Dr Rayann Heman for evaluation of palpitations  . MINOR CARPAL TUNNEL     right  . POLYPECTOMY  04/17/2016   Procedure: POLYPECTOMY;  Surgeon: [name], MD;  [facility]: [facility];  Service: Endoscopy;;  sigmoid    Allergies: Allergies  Allergen Reactions  . Amitriptyline Anaphylaxis  . Amoxicillin Anaphylaxis and Rash    Has patient had a PCN reaction causing  immediate rash, facial/tongue/throat swelling, SOB or lightheadedness with hypotension: Yes Has patient had a PCN reaction causing severe rash involving mucus membranes or skin necrosis: No Has patient had a PCN reaction that required hospitalization: No Has patient had a PCN reaction occurring within the last 10 years: Yes If all of the above answers are "NO", then may proceed with Cephalosporin use.   Diona Fanti [Aspirin] Anaphylaxis  . Penicillin G Anaphylaxis  . Clonidine Derivatives Other (See Comments)    Severe Dry Mouth  . Hydrochlorothiazide     Caused rectal bleeding and hematuria  . Levothyroxine Nausea And Vomiting    Patient states that the 75 mcg is too strong for him.  Rennis Harding [Vortioxetine] Nausea And Vomiting      Home Medications:  Current Outpatient Medications:  .  ALPRAZolam (XANAX) 1 MG tablet, Take 1 mg by mouth 2 (two) times daily as needed for anxiety. , Disp: , Rfl:  .  Cholecalciferol (VITAMIN D3) 1.25 MG (50000 UT) CAPS, Take 1 capsule by mouth once a week., Disp: , Rfl:  .  diltiazem (CARDIZEM CD) 300 MG 24 hr capsule, Take 1 capsule (300 mg total) by mouth daily., Disp: 30 capsule, Rfl: 3 .  ergocalciferol (VITAMIN D2) 1.25 MG (50000 UT) capsule, Vitamin D2 1,250 mcg (50,000 unit) capsule  TAKE 1 CAPSULE BY MOUTH EVERY WEEK, Disp: , Rfl:  .  folic acid (FOLVITE) 1 MG tablet, Take 1 tablet (1 mg total) by mouth daily., Disp: 30 tablet, Rfl: 0 .  furosemide (LASIX) 20 MG tablet, Take 1 tablet (20 mg total) by mouth daily., Disp: 30 tablet, Rfl: 2 .  LYRICA 200 MG capsule, Take 400 mg by mouth daily. , Disp: , Rfl: 2 .  Magnesium Hydroxide (MAGNESIA PO), Take 1 tablet by mouth daily., Disp: , Rfl:  .  pantoprazole (PROTONIX) 40 MG tablet, pantoprazole 40 mg tablet,delayed release  TAKE 1 TABLET BY MOUTH ONCE DAILY BEFORE BREAKFAST, Disp: , Rfl:  .  spironolactone (ALDACTONE) 50 MG tablet, Take 1 tablet (50 mg total) by mouth daily., Disp: 30 tablet, Rfl: 2 .   thiamine 100 MG tablet, Take 1 tablet (100 mg total) by mouth daily., Disp: 30 tablet, Rfl: 0 .  zinc gluconate 50 MG tablet, Take 50 mg by mouth every 30 (thirty) days. Take with vitamin d, Disp: , Rfl:  .  chlordiazePOXIDE (LIBRIUM) 10 MG capsule, Take 1 capsule (10 mg total) by mouth 3 (three) times daily as needed for anxiety., Disp: 20 capsule, Rfl: 0 .  chlorthalidone (HYGROTON) 25 MG tablet, chlorthalidone 25 mg tablet  TAKE 1 TABLET BY MOUTH ONCE DAILY, Disp: , Rfl:  .  diclofenac (VOLTAREN) 75 MG EC tablet, Take 1 tablet (75 mg total) by mouth 2 (two) times daily. (Patient not taking: Reported on 08/18/2019), Disp: 60 tablet, Rfl: 2 .  diltiazem (TIAZAC) 240 MG 24 hr capsule, diltiazem CD 240 mg capsule,extended release 24 hr  TAKE 1  CAPSULE BY MOUTH ONCE DAILY, Disp: , Rfl:  .  levothyroxine (SYNTHROID) 50 MCG tablet, Take 1 tablet (50 mcg total) by mouth daily. (Patient not taking: Reported on 08/18/2019), Disp: 90 tablet, Rfl: 3 .  lisinopril (ZESTRIL) 20 MG tablet, lisinopril 20 mg tablet  TAKE 1 TABLET BY MOUTH ONCE DAILY (Patient not taking: Reported on 08/18/2019), Disp: , Rfl:  .  midodrine (PROAMATINE) 5 MG tablet, Take 1 tablet (5 mg total) by mouth 3 (three) times daily with meals. (Patient not taking: Reported on 08/18/2019), Disp: 90 tablet, Rfl: 2 .  ondansetron (ZOFRAN) 4 MG tablet, Take 1 tablet (4 mg total) by mouth every 8 (eight) hours as needed for nausea or vomiting. (Patient not taking: Reported on 08/18/2019), Disp: 20 tablet, Rfl: 0 .  Red Yeast Rice Extract (RED YEAST RICE PO), Take by mouth in the morning and at bedtime.  (Patient not taking: Reported on 08/18/2019), Disp: , Rfl:  .  vortioxetine HBr (TRINTELLIX) 10 MG TABS tablet, Trintellix 10 mg tablet  TAKE 1 TABLET BY MOUTH DAILY (Patient not taking: Reported on 08/18/2019), Disp: , Rfl:    Family History: family history includes Alzheimer's disease in his mother; Coronary artery disease in his father and paternal  grandfather; Heart disease in his father; Liver disease in his father.    Social History:   reports that he quit smoking about 40 years ago. He has never used smokeless tobacco. He reports previous alcohol use of about 8.0 standard drinks of alcohol per week. He reports that he does not use drugs.   Review of Systems: Constitutional: Denies weight loss/weight gain  Eyes: No changes in vision. ENT: No oral lesions, sore throat.  GI: see HPI.  Heme/Lymph: No easy bruising.  CV: No chest pain.  GU: No hematuria.  Integumentary: No rashes.  Neuro: No headaches.  Psych: No depression/anxiety.  Endocrine: No heat/cold intolerance.  Allergic/Immunologic: No urticaria.  Resp: No cough, SOB.  Musculoskeletal: No joint swelling.    Physical Examination: BP 107/73 (BP Location: Right Arm, Patient Position: Sitting, Cuff Size: Large)   Pulse 80   Temp 98.9 F (37.2 C) (Oral)   Ht 5' 10"  (1.778 m)   Wt (!) 242 lb (109.8 kg)   BMI 34.72 kg/m  Gen: NAD, alert and oriented x 4 HEENT: PEERLA, EOMI, Neck: supple, no JVD Chest: CTA bilaterally, no wheezes, crackles, or other adventitious sounds CV: RRR, no m/g/c/r Abd: soft, NT, ND, +BS in all four quadrants; no HSM, guarding, ridigity, or rebound tenderness.  Clinically no evidence of ascites. Ext: no edema, well perfused with 2+ pulses, Skin: no rash or lesions noted on observed skin Lymph: no noted LAD  Data Reviewed:   Labs August 12, 2019-Creatinine 1.57, GFR 47, LFTs with alk phos 151, AST 84, ALT normal (per chart review this was to be decreased to Lasix 34m and spironolactone 543m but patient does not recall doing this)  CT a/p 07/26/19-IMPRESSION: 1. 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 2. No focal hepatic lesions. 3. Stable right renal calculus. 4. Aortic atherosclerosis.   Assessment/Plan: Mr. GlHagemans a 59y.o. male seen today for follow-up.  History of new onset ascites June 2021 with prior heavy alcohol use.  1.  Ascites-clinically patient has lost approximately 40 pounds of fluid weight, he did have an elevated creatinine last week, will recheck today but likely will need to decrease his diuretic doses.  He is watching his salt. No ascites or LE edema noted on exam.  2.  Cirrhosis-prior heavy alcohol use but now sober since hospital discharge.  Reviewed importance of continuing this long term.  Last INR 1.2.  Platelets normal.  Albumin normal.  Acute hepatitis panel negative-plan to check for Hep A/B immunity at next OV.  Up-to-date on imaging but would consider AFP monitoring in future.  Also will discuss with Dr. Laural Golden possible endoscopy for variceal screening.  Diagnoses and all orders for this visit:  Abnormal LFTs -     COMPLETE METABOLIC PANEL WITH GFR  Alcoholic cirrhosis, unspecified whether ascites present (HCC)    25 min total pt care. >50% face to face.    I personally performed the service, non-incident to. (WP)  Laurine Blazer, [facility]

## 2019-08-18 NOTE — Telephone Encounter (Signed)
Pt returned missed call from [facility] regarding lab results and xray results. Reviewed results with pt per notes. Pt voiced understanding as said he had his lab work rechecked today and is waiting to here back on results.

## 2019-08-18 NOTE — Patient Instructions (Signed)
We are checking labs and will call w/ results. I will discuss letter w/ Dr Laural Golden.

## 2019-08-22 ENCOUNTER — Other Ambulatory Visit (INDEPENDENT_AMBULATORY_CARE_PROVIDER_SITE_OTHER): Payer: Self-pay | Admitting: Gastroenterology

## 2019-08-22 DIAGNOSIS — K7030 Alcoholic cirrhosis of liver without ascites: Secondary | ICD-10-CM

## 2019-08-22 NOTE — Progress Notes (Unsigned)
Repeat labs to be done in about 2 weeks to assess renal status.

## 2019-08-22 NOTE — Progress Notes (Signed)
Lab order has been mailed to the patient.

## 2019-08-23 ENCOUNTER — Encounter (INDEPENDENT_AMBULATORY_CARE_PROVIDER_SITE_OTHER): Payer: Self-pay | Admitting: *Deleted

## 2019-08-23 ENCOUNTER — Encounter: Payer: Self-pay | Admitting: "Endocrinology

## 2019-08-23 ENCOUNTER — Other Ambulatory Visit: Payer: Self-pay

## 2019-08-23 ENCOUNTER — Ambulatory Visit (INDEPENDENT_AMBULATORY_CARE_PROVIDER_SITE_OTHER): Payer: Medicaid Other | Admitting: "Endocrinology

## 2019-08-23 VITALS — BP 120/76 | HR 80 | Ht 70.0 in | Wt 243.6 lb

## 2019-08-23 DIAGNOSIS — E039 Hypothyroidism, unspecified: Secondary | ICD-10-CM

## 2019-08-23 DIAGNOSIS — Z7689 Persons encountering health services in other specified circumstances: Secondary | ICD-10-CM | POA: Diagnosis not present

## 2019-08-23 MED ORDER — LEVOTHYROXINE SODIUM 25 MCG PO TABS: 25.0000 ug | ORAL_TABLET | Freq: Every day | ORAL | 3 refills | Status: DC

## 2019-08-23 NOTE — Progress Notes (Signed)
Endocrinology Consult Note                                         08/23/2019, 9:48 PM   Earl [name] is a 59 y.o.-year-old male patient being seen in consultation for hypothyroidism referred by Sharion Balloon, FNP.   Past Medical History:  Diagnosis Date  . Alcohol abuse   . Anxiety   . Arthritis   . Colon cancer ([place])   . Depression   . Gall stones   . Heart burn   . Hyperlipidemia   . Hypertension   . Hypothyroidism   . Kidney stone   . Liver disease   . Tachycardia   . Typical atrial flutter [facility])     Past Surgical History:  Procedure Laterality Date  . A-FLUTTER ABLATION N/A 07/31/2016   Procedure: A-Flutter Ablation;  Surgeon: [name], MD;  Location: [place] [facility];  Service: Cardiovascular;  Laterality: N/A;  . COLONOSCOPY N/A 04/17/2016   Procedure: COLONOSCOPY;  Surgeon: [name], MD;  [facility];  Service: Endoscopy;  Laterality: N/A;  1200  . COLONOSCOPY N/A 05/07/2017   Procedure: COLONOSCOPY;  Surgeon: [name], MD;  [facility];  Service: Endoscopy;  Laterality: N/A;  200  . implantable loop recorder placement  01/24/2019    Medtronic Reveal Linq model LNQ 11 implantable loop recorder (SN [id number] ) by Dr Rayann Heman for evaluation of palpitations  . MINOR CARPAL TUNNEL     right  . POLYPECTOMY  04/17/2016   Procedure: POLYPECTOMY;  Surgeon: [name], MD;  [facility];  Service: Endoscopy;;  sigmoid    Social History   Socioeconomic History  . Marital status: Single    Spouse name: Not on file  . Number of children: Not on file  . Years of education: Not on file  . Highest education level: Not on file  Occupational History  . Not on file  Tobacco Use  . Smoking status: Former Smoker    Quit date: 03/03/1979    Years since quitting: 40.5  . Smokeless tobacco: Never Used  Vaping Use  . Vaping Use: Never used   Substance and Sexual Activity  . Alcohol use: Not Currently    Alcohol/week: 8.0 standard drinks    Types: 8 Cans of beer per week    Comment: Patient states that he stopped 1 month ago  . Drug use: No  . Sexual activity: Not on file  Other Topics Concern  . Not on file  Social History Narrative   Disabled   Lives in [place] Determinants of Health   Financial Resource Strain:   . Difficulty of Paying Living Expenses:   Food Insecurity:   . Worried About Charity fundraiser in the Last Year:   . [place] in the Last Year:   Transportation Needs:   . Lack of  Transportation (Medical):   Marland Kitchen Lack of Transportation (Non-Medical):   Physical Activity:   . Days of Exercise per Week:   . Minutes of Exercise per Session:   Stress:   . Feeling of Stress :   Social Connections:   . Frequency of Communication with Friends and Family:   . Frequency of Social Gatherings with Friends and Family:   . Attends Religious Services:   . Active Member of Clubs or Organizations:   . Attends Archivist Meetings:   Marland Kitchen Marital Status:     Family History  Problem Relation Age of Onset  . Alzheimer's disease Mother   . Heart disease Father   . Coronary artery disease Father   . Liver disease Father   . Thyroid disease Father   . Hyperlipidemia Father   . Heart attack Father   . Coronary artery disease Paternal Grandfather     Outpatient Encounter Medications as of 08/23/2019  Medication Sig  . ALPRAZolam (XANAX) 1 MG tablet Take 1 mg by mouth 2 (two) times daily as needed for anxiety.   . chlordiazePOXIDE (LIBRIUM) 10 MG capsule Take 1 capsule (10 mg total) by mouth 3 (three) times daily as needed for anxiety.  . chlorthalidone (HYGROTON) 25 MG tablet chlorthalidone 25 mg tablet  TAKE 1 TABLET BY MOUTH ONCE DAILY  . diclofenac (VOLTAREN) 75 MG EC tablet Take 1 tablet (75 mg total) by mouth 2 (two) times daily. (Patient not taking: Reported on 08/18/2019)  . diltiazem  (CARDIZEM CD) 300 MG 24 hr capsule Take 1 capsule (300 mg total) by mouth daily.  . ergocalciferol (VITAMIN D2) 1.25 MG (50000 UT) capsule Vitamin D2 1,250 mcg (50,000 unit) capsule  TAKE 1 CAPSULE BY MOUTH EVERY WEEK  . folic acid (FOLVITE) 1 MG tablet Take 1 tablet (1 mg total) by mouth daily.  . furosemide (LASIX) 20 MG tablet Take 1 tablet (20 mg total) by mouth daily.  Marland Kitchen levothyroxine (SYNTHROID) 25 MCG tablet Take 1 tablet (25 mcg total) by mouth daily before breakfast.  . lisinopril (ZESTRIL) 20 MG tablet lisinopril 20 mg tablet  TAKE 1 TABLET BY MOUTH ONCE DAILY (Patient not taking: Reported on 08/18/2019)  . LYRICA 200 MG capsule Take 400 mg by mouth daily.   . Magnesium Hydroxide (MAGNESIA PO) Take 1 tablet by mouth daily.  . midodrine (PROAMATINE) 5 MG tablet Take 1 tablet (5 mg total) by mouth 3 (three) times daily with meals. (Patient not taking: Reported on 08/18/2019)  . ondansetron (ZOFRAN) 4 MG tablet Take 1 tablet (4 mg total) by mouth every 8 (eight) hours as needed for nausea or vomiting. (Patient not taking: Reported on 08/18/2019)  . pantoprazole (PROTONIX) 40 MG tablet pantoprazole 40 mg tablet,delayed release  TAKE 1 TABLET BY MOUTH ONCE DAILY BEFORE BREAKFAST  . Red Yeast Rice Extract (RED YEAST RICE PO) Take by mouth in the morning and at bedtime.  (Patient not taking: Reported on 08/18/2019)  . spironolactone (ALDACTONE) 50 MG tablet Take 1 tablet (50 mg total) by mouth daily.  Marland Kitchen thiamine 100 MG tablet Take 1 tablet (100 mg total) by mouth daily.  Marland Kitchen vortioxetine HBr (TRINTELLIX) 10 MG TABS tablet Trintellix 10 mg tablet  TAKE 1 TABLET BY MOUTH DAILY (Patient not taking: Reported on 08/18/2019)  . zinc gluconate 50 MG tablet Take 50 mg by mouth every 30 (thirty) days. Take with vitamin d  . [DISCONTINUED] Cholecalciferol (VITAMIN D3) 1.25 MG (50000 UT) CAPS Take 1 capsule by mouth  once a week.  . [DISCONTINUED] diltiazem (TIAZAC) 240 MG 24 hr capsule diltiazem CD 240 mg  capsule,extended release 24 hr  TAKE 1 CAPSULE BY MOUTH ONCE DAILY  . [DISCONTINUED] levothyroxine (SYNTHROID) 50 MCG tablet Take 1 tablet (50 mcg total) by mouth daily. (Patient not taking: Reported on 08/18/2019)   No facility-administered encounter medications on file as of 08/23/2019.    ALLERGIES: Allergies  Allergen Reactions  . Amitriptyline Anaphylaxis  . Amoxicillin Anaphylaxis and Rash    Has patient had a PCN reaction causing immediate rash, facial/tongue/throat swelling, SOB or lightheadedness with hypotension: Yes Has patient had a PCN reaction causing severe rash involving mucus membranes or skin necrosis: No Has patient had a PCN reaction that required hospitalization: No Has patient had a PCN reaction occurring within the last 10 years: Yes If all of the above answers are "NO", then may proceed with Cephalosporin use.   Diona Fanti [Aspirin] Anaphylaxis  . Penicillin G Anaphylaxis  . Clonidine Derivatives Other (See Comments)    Severe Dry Mouth  . Hydrochlorothiazide     Caused rectal bleeding and hematuria  . Levothyroxine Nausea And Vomiting    Patient states that the 75 mcg is too strong for him.  Rennis Harding [Vortioxetine] Nausea And Vomiting   VACCINATION STATUS: Immunization History  Administered Date(s) Administered  . Moderna SARS-COVID-2 Vaccination 05/17/2019, 06/14/2019     HPI    Earl [name]  is a patient with the above medical history. he was diagnosed  with hypothyroidism at approximate age of 67 years with lab work. He was offered subsequnt initiation of levothyroxine. However, he decided not to take it for no particular reason. He continues to have abnormal TFts including high TSH upto 8.5. He continues to observe progressive weigh gain, fatigue.   I reviewed patient's  thyroid tests:  Lab Results  Component Value Date   TSH 4.746 (H) 07/26/2019   TSH 6.320 (H) 06/06/2019   TSH 8.500 (H) 03/02/2019   TSH 7.260 (H) 10/13/2018   TSH  4.010 08/03/2017   TSH 5.031 (H) 03/06/2017   TSH 2.790 02/10/2017   TSH 4.570 (H) 10/02/2016   FREET4 0.89 08/03/2017     Pt describes: - fluctuating weight - fatigue   Pt denies feeling nodules in neck, hoarseness, dysphagia/odynophagia, SOB with lying down.  he has family history of various  thyroid disorders in his father, siblings.  No family history of thyroid cancer.  No history of  radiation therapy to head or neck. No recent use of iodine supplements.   ROS:  Constitutional: fluctuating body weight, no fatigue, no subjective hyperthermia, no subjective hypothermia Eyes: no blurry vision, no xerophthalmia ENT: no sore throat, no nodules palpated in throat, no dysphagia/odynophagia, no hoarseness Cardiovascular: no Chest Pain, no Shortness of Breath, no palpitations, no leg swelling Respiratory: no cough, no SOB Gastrointestinal: no Nausea/Vomiting/Diarhhea Musculoskeletal: no muscle/joint aches Skin: no rashes Neurological: no tremors, no numbness, no tingling, no dizziness Psychiatric: no depression, no anxiety   Physical Exam: BP 120/76   Pulse 80   Ht 5\' 10"  (1.778 m)   Wt (!) 243 lb 9.6 oz (110.5 kg)   BMI 34.95 kg/m  Wt Readings from Last 3 Encounters:  08/23/19 (!) 243 lb 9.6 oz (110.5 kg)  08/18/19 (!) 242 lb (109.8 kg)  08/15/19 248 lb (112.5 kg)    Constitutional:  Body mass index is 34.95 kg/m., not in acute distress, normal state of mind Eyes: PERRLA, EOMI, no exophthalmos ENT:  moist mucous membranes, no thyromegaly, no cervical lymphadenopathy Cardiovascular: normal precordial activity, Regular Rate and Rhythm, no Murmur/Rubs/Gallops Respiratory:  adequate breathing efforts, no gross chest deformity, Clear to auscultation bilaterally Gastrointestinal: abdomen soft, Non -tender, No distension, Bowel Sounds present Musculoskeletal: no gross deformities, strength intact in all four extremities Skin: moist, warm, no rashes Neurological: no tremor  with outstretched hands, Deep tendon reflexes normal in all four extremities.   CMP ( most recent) CMP     Component Value Date/Time   NA 137 08/18/2019 1404   NA 142 08/12/2019 1641   K 4.6 08/18/2019 1404   CL 98 08/18/2019 1404   CO2 29 08/18/2019 1404   GLUCOSE 108 08/18/2019 1404   BUN 14 08/18/2019 1404   BUN 15 08/12/2019 1641   CREATININE 1.47 (H) 08/18/2019 1404   CALCIUM 9.9 08/18/2019 1404   PROT 8.5 (H) 08/18/2019 1404   PROT 6.9 06/06/2019 1203   ALBUMIN 2.8 (L) 07/30/2019 0630   ALBUMIN 3.5 (L) 06/06/2019 1203   AST 78 (H) 08/18/2019 1404   ALT 48 (H) 08/18/2019 1404   ALKPHOS 126 07/30/2019 0630   BILITOT 1.2 08/18/2019 1404   BILITOT 0.5 06/06/2019 1203   GFRNONAA 51 (L) 08/18/2019 1404   GFRAA 59 (L) 08/18/2019 1404     Diabetic Labs (most recent): Lab Results  Component Value Date   HGBA1C 4.5 (L) 07/26/2019     Lipid Panel ( most recent) Lipid Panel     Component Value Date/Time   CHOL 211 (H) 03/02/2019 1255   TRIG 174 (H) 03/02/2019 1255   HDL 27 (L) 03/02/2019 1255   CHOLHDL 7.8 (H) 03/02/2019 1255   LDLCALC 152 (H) 03/02/2019 1255   LABVLDL 32 03/02/2019 1255     Lab Results  Component Value Date   TSH 4.746 (H) 07/26/2019   TSH 6.320 (H) 06/06/2019   TSH 8.500 (H) 03/02/2019   TSH 7.260 (H) 10/13/2018   TSH 4.010 08/03/2017   TSH 5.031 (H) 03/06/2017   TSH 2.790 02/10/2017   TSH 4.570 (H) 10/02/2016   FREET4 0.89 08/03/2017       ASSESSMENT: 1. Hypothyroidism  PLAN:    Patient with settled diagnosis of  Hypothyroidism. Hesitant to take thyroid hormone. - On physical exam , patient  does not  have  gross goiter, thyroid nodules, or neck compression symptoms. - he is approached with plan to start levothyroxine at a low dose and advised slowly.  He accepts levothyroxine 25 mcg p.o. daily before breakfast. - We discussed about correct intake of levothyroxine, at fasting, with water, separated by at least 30 minutes from  breakfast, and separated by more than 4 hours from calcium, iron, multivitamins, acid reflux medications (PPIs). -Patient is made aware of the fact that thyroid hormone replacement is needed for life, dose to be adjusted by periodic monitoring of thyroid function tests. - Will check thyroid tests before next visit: TSH, free T4 -Due to absence of clinical goiter, no need for thyroid ultrasound.  - Time spent with the patient: 60 minutes, of which >50% was spent in obtaining information about his symptoms, reviewing his previous labs, evaluations, and treatments, counseling him about his hypothyroidism, and developing a plan to confirm the diagnosis and long term treatment as necessary. Please refer to " Patient Self Inventory" in the Media  tab for reviewed elements of pertinent patient history.  Earl [name] participated in the discussions, expressed understanding, and voiced agreement with the above plans.  All questions  were answered to his satisfaction. he is encouraged to contact clinic should he have any questions or concerns prior to his return visit.  Return in about 3 months (around 11/23/2019) for F/U with Pre-visit Labs.  Glade Lloyd, MD [facility] [address] [place], [place] [address] Phone: [phone]  Fax: [phone]   08/23/2019, 9:48 PM  This note was partially dictated with voice recognition software. Similar sounding words can be transcribed inadequately or may not  be corrected upon review.

## 2019-08-24 ENCOUNTER — Telehealth: Payer: Self-pay | Admitting: Family

## 2019-08-24 ENCOUNTER — Telehealth (INDEPENDENT_AMBULATORY_CARE_PROVIDER_SITE_OTHER): Payer: Self-pay | Admitting: *Deleted

## 2019-08-24 DIAGNOSIS — M542 Cervicalgia: Secondary | ICD-10-CM

## 2019-08-24 NOTE — Telephone Encounter (Signed)
Patient called stated you wanted neurologist to look at his CT -- do I need to do a referral to a neurologist

## 2019-08-24 NOTE — Telephone Encounter (Signed)
°  REFERRAL REQUEST Telephone Note 08/24/2019  What type of referral do you need? [facility] [phone]  Why do you need this referral? Spine/neck pain & CT scan shows his pain  Have you been seen at our office for this problem? Yes (Advise that they may need an appointment with their PCP before a referral can be done)  Is there a particular doctor or location that you prefer? [place]  Patient notified that referrals can take up to a week or longer to process. If they haven't heard anything within a week they should call back and speak with the referral department.   Lenna Gilford' pt.  Please call pt.

## 2019-08-25 NOTE — Telephone Encounter (Signed)
Pain Clinic referral placed.

## 2019-08-31 DIAGNOSIS — Z79891 Long term (current) use of opiate analgesic: Secondary | ICD-10-CM | POA: Diagnosis not present

## 2019-08-31 DIAGNOSIS — G894 Chronic pain syndrome: Secondary | ICD-10-CM | POA: Diagnosis not present

## 2019-08-31 DIAGNOSIS — Z7689 Persons encountering health services in other specified circumstances: Secondary | ICD-10-CM | POA: Diagnosis not present

## 2019-08-31 DIAGNOSIS — G609 Hereditary and idiopathic neuropathy, unspecified: Secondary | ICD-10-CM | POA: Diagnosis not present

## 2019-08-31 DIAGNOSIS — M545 Low back pain: Secondary | ICD-10-CM | POA: Diagnosis not present

## 2019-08-31 DIAGNOSIS — M25519 Pain in unspecified shoulder: Secondary | ICD-10-CM | POA: Diagnosis not present

## 2019-08-31 DIAGNOSIS — M542 Cervicalgia: Secondary | ICD-10-CM | POA: Diagnosis not present

## 2019-09-05 NOTE — Telephone Encounter (Signed)
He has small stone in right kidney which is nonobstructing.  He had stone back in April 2018. He will need to see urologist at some point.  If he is interested we can make an appointment for him to see Dr. Nicolette Bang

## 2019-09-06 DIAGNOSIS — K7030 Alcoholic cirrhosis of liver without ascites: Secondary | ICD-10-CM | POA: Diagnosis not present

## 2019-09-06 DIAGNOSIS — Z7689 Persons encountering health services in other specified circumstances: Secondary | ICD-10-CM | POA: Diagnosis not present

## 2019-09-07 LAB — COMPLETE METABOLIC PANEL WITH GFR
AG Ratio: 1.1 (calc) (ref 1.0–2.5)
ALT: 30 U/L (ref 9–46)
AST: 40 U/L — ABNORMAL HIGH (ref 10–35)
Albumin: 3.9 g/dL (ref 3.6–5.1)
Alkaline phosphatase (APISO): 113 U/L (ref 35–144)
BUN: 7 mg/dL (ref 7–25)
CO2: 29 mmol/L (ref 20–32)
Calcium: 9 mg/dL (ref 8.6–10.3)
Chloride: 103 mmol/L (ref 98–110)
Creat: 1.12 mg/dL (ref 0.70–1.25)
GFR, Est African American: 82 mL/min/{1.73_m2} (ref >=60)
GFR, Est Non African American: 71 mL/min/{1.73_m2} (ref >=60)
Globulin: 3.5 g/dL (calc) (ref 1.9–3.7)
Glucose, Bld: 88 mg/dL (ref 65–99)
Potassium: 4.6 mmol/L (ref 3.5–5.3)
Sodium: 136 mmol/L (ref 135–146)
Total Bilirubin: 1.1 mg/dL (ref 0.2–1.2)
Total Protein: 7.4 g/dL (ref 6.1–8.1)

## 2019-09-07 NOTE — Telephone Encounter (Signed)
Left message for patient to give me a call.

## 2019-09-19 ENCOUNTER — Ambulatory Visit (INDEPENDENT_AMBULATORY_CARE_PROVIDER_SITE_OTHER): Payer: Medicaid Other | Admitting: *Deleted

## 2019-09-19 DIAGNOSIS — R002 Palpitations: Secondary | ICD-10-CM | POA: Diagnosis not present

## 2019-09-19 LAB — CUP PACEART REMOTE DEVICE CHECK
Date Time Interrogation Session: 20210820231831
Implantable Pulse Generator Implant Date: 20201228

## 2019-09-22 NOTE — Progress Notes (Signed)
Carelink Summary Report / Loop Recorder 

## 2019-09-29 DIAGNOSIS — M25519 Pain in unspecified shoulder: Secondary | ICD-10-CM | POA: Diagnosis not present

## 2019-09-29 DIAGNOSIS — F419 Anxiety disorder, unspecified: Secondary | ICD-10-CM | POA: Diagnosis not present

## 2019-09-29 DIAGNOSIS — G609 Hereditary and idiopathic neuropathy, unspecified: Secondary | ICD-10-CM | POA: Diagnosis not present

## 2019-09-29 DIAGNOSIS — M545 Low back pain: Secondary | ICD-10-CM | POA: Diagnosis not present

## 2019-09-29 DIAGNOSIS — Z7689 Persons encountering health services in other specified circumstances: Secondary | ICD-10-CM | POA: Diagnosis not present

## 2019-09-29 DIAGNOSIS — Z79891 Long term (current) use of opiate analgesic: Secondary | ICD-10-CM | POA: Diagnosis not present

## 2019-10-12 ENCOUNTER — Telehealth (INDEPENDENT_AMBULATORY_CARE_PROVIDER_SITE_OTHER): Payer: Self-pay | Admitting: Gastroenterology

## 2019-10-12 NOTE — Telephone Encounter (Signed)
Patient called wants to know when he needs to have lab work done again - please advise - ph# [phone]

## 2019-10-13 NOTE — Telephone Encounter (Signed)
Patient is aware of all. He will mark his calendar to call [facility] for transportation to the appt.

## 2019-10-13 NOTE — Telephone Encounter (Signed)
Please notify patient we will get repeat labs at his November follow-up appointment.  Thanks

## 2019-10-24 ENCOUNTER — Ambulatory Visit (INDEPENDENT_AMBULATORY_CARE_PROVIDER_SITE_OTHER): Payer: Medicaid Other | Admitting: Emergency Medicine

## 2019-10-24 DIAGNOSIS — R002 Palpitations: Secondary | ICD-10-CM

## 2019-10-24 LAB — CUP PACEART REMOTE DEVICE CHECK
Date Time Interrogation Session: 20210922233511
Implantable Pulse Generator Implant Date: 20201228

## 2019-10-26 DIAGNOSIS — F331 Major depressive disorder, recurrent, moderate: Secondary | ICD-10-CM | POA: Diagnosis not present

## 2019-10-26 DIAGNOSIS — F419 Anxiety disorder, unspecified: Secondary | ICD-10-CM | POA: Diagnosis not present

## 2019-10-26 DIAGNOSIS — M542 Cervicalgia: Secondary | ICD-10-CM | POA: Diagnosis not present

## 2019-10-26 DIAGNOSIS — G609 Hereditary and idiopathic neuropathy, unspecified: Secondary | ICD-10-CM | POA: Diagnosis not present

## 2019-10-26 DIAGNOSIS — Z79891 Long term (current) use of opiate analgesic: Secondary | ICD-10-CM | POA: Diagnosis not present

## 2019-10-26 DIAGNOSIS — M545 Low back pain: Secondary | ICD-10-CM | POA: Diagnosis not present

## 2019-10-26 DIAGNOSIS — M25519 Pain in unspecified shoulder: Secondary | ICD-10-CM | POA: Diagnosis not present

## 2019-10-26 NOTE — Progress Notes (Signed)
Carelink Summary Report / Loop Recorder 

## 2019-10-27 ENCOUNTER — Telehealth (INDEPENDENT_AMBULATORY_CARE_PROVIDER_SITE_OTHER): Payer: Self-pay | Admitting: *Deleted

## 2019-10-27 NOTE — Telephone Encounter (Signed)
Patient is on recall for 6 mth CT Abdomen - it looks like he has CT A/P in 06/2019 - does he still need the 6 mth one you wanted

## 2019-10-28 ENCOUNTER — Encounter: Payer: Medicaid Other | Admitting: Internal Medicine

## 2019-10-29 NOTE — Telephone Encounter (Signed)
Since he had CT in June this year and lymph nodes were stable.  He therefore does not need CT now.

## 2019-11-07 ENCOUNTER — Telehealth: Payer: Self-pay

## 2019-11-07 DIAGNOSIS — R188 Other ascites: Secondary | ICD-10-CM

## 2019-11-07 MED ORDER — FUROSEMIDE 20 MG PO TABS: 20.0000 mg | ORAL_TABLET | Freq: Every day | ORAL | 2 refills | Status: DC

## 2019-11-07 NOTE — Telephone Encounter (Signed)
Pt aware rx sent pharmacy

## 2019-11-07 NOTE — Telephone Encounter (Signed)
NA/NVM refill sent to pharmacy 

## 2019-11-07 NOTE — Telephone Encounter (Signed)
  Prescription Request  11/07/2019  What is the name of the medication or equipment? lasix  Have you contacted your pharmacy to request a refill? (if applicable) yes  Which pharmacy would you like this sent to? [facility]   Patient notified that their request is being sent to the clinical staff for review and that they should receive a response within 2 business days.

## 2019-11-10 ENCOUNTER — Encounter: Payer: Self-pay | Admitting: Family

## 2019-11-10 ENCOUNTER — Ambulatory Visit (INDEPENDENT_AMBULATORY_CARE_PROVIDER_SITE_OTHER): Payer: Medicaid Other | Admitting: Family

## 2019-11-10 VITALS — BP 118/77

## 2019-11-10 DIAGNOSIS — C187 Malignant neoplasm of sigmoid colon: Secondary | ICD-10-CM

## 2019-11-10 DIAGNOSIS — K219 Gastro-esophageal reflux disease without esophagitis: Secondary | ICD-10-CM | POA: Diagnosis not present

## 2019-11-10 DIAGNOSIS — E038 Other specified hypothyroidism: Secondary | ICD-10-CM

## 2019-11-10 DIAGNOSIS — F339 Major depressive disorder, recurrent, unspecified: Secondary | ICD-10-CM

## 2019-11-10 DIAGNOSIS — I10 Essential (primary) hypertension: Secondary | ICD-10-CM

## 2019-11-10 DIAGNOSIS — I4729 Other ventricular tachycardia: Secondary | ICD-10-CM

## 2019-11-10 DIAGNOSIS — R188 Other ascites: Secondary | ICD-10-CM

## 2019-11-10 DIAGNOSIS — F1020 Alcohol dependence, uncomplicated: Secondary | ICD-10-CM

## 2019-11-10 DIAGNOSIS — G629 Polyneuropathy, unspecified: Secondary | ICD-10-CM

## 2019-11-10 DIAGNOSIS — I484 Atypical atrial flutter: Secondary | ICD-10-CM | POA: Diagnosis not present

## 2019-11-10 DIAGNOSIS — Z7289 Other problems related to lifestyle: Secondary | ICD-10-CM | POA: Diagnosis not present

## 2019-11-10 DIAGNOSIS — K7460 Unspecified cirrhosis of liver: Secondary | ICD-10-CM

## 2019-11-10 DIAGNOSIS — I472 Ventricular tachycardia: Secondary | ICD-10-CM | POA: Diagnosis not present

## 2019-11-10 DIAGNOSIS — Z789 Other specified health status: Secondary | ICD-10-CM

## 2019-11-10 MED ORDER — FUROSEMIDE 20 MG PO TABS: 20.0000 mg | ORAL_TABLET | Freq: Every day | ORAL | 2 refills | Status: DC

## 2019-11-10 MED ORDER — SPIRONOLACTONE 50 MG PO TABS: 50.0000 mg | ORAL_TABLET | Freq: Every day | ORAL | 2 refills | Status: DC

## 2019-11-10 NOTE — Progress Notes (Signed)
Virtual Visit via telephone Note Due to COVID-19 pandemic this visit was conducted virtually. This visit type was conducted due to national recommendations for restrictions regarding the COVID-19 Pandemic (e.g. social distancing, sheltering in place) in an effort to limit this patient's exposure and mitigate transmission in our community. All issues noted in this document were discussed and addressed.  A physical exam was not performed with this format.  I connected with Earl [name] on 11/10/19 at 8:45 AM by telephone and verified that I am speaking with the correct person using two identifiers. Earl [name] is currently located at home and no one is currently with him  during visit. The provider, Evelina Dun, FNP is located in their office at time of visit.  I discussed the limitations, risks, security and privacy concerns of performing an evaluation and management service by telephone and the availability of in person appointments. I also discussed with the patient that there may be a patient responsible charge related to this service. The patient expressed understanding and agreed to proceed.   History and Present Illness:  Pt calls the office today for chronic follow up. He is followed by GI for alcoholic cirrhosis and malignant neoplasm of colon. He is followed by Pain Clinic once a month for chronic pain and neuropathy. He has an appointment with Cardiologists for NSVT and atrial flutter.   He states he quit drinking about a month ago.  Hypertension This is a chronic problem. The current episode started more than 1 year ago. The problem has been resolved since onset. Identifiable causes of hypertension include a thyroid problem.  Thyroid Problem Presents for follow-up visit. Symptoms include constipation. Patient reports no cold intolerance, diarrhea or dry skin. The symptoms have been stable.  Gastroesophageal Reflux He complains of belching, heartburn and nausea. This is a  chronic problem. The current episode started more than 1 year ago. The problem occurs occasionally. The problem has been waxing and waning. The symptoms are aggravated by certain foods. He has tried a PPI for the symptoms. The treatment provided moderate relief.      Review of Systems  Gastrointestinal: Positive for constipation, heartburn and nausea. Negative for diarrhea.  Endo/Heme/Allergies: Negative for cold intolerance.     Observations/Objective: No SOB or distress noted  Assessment and Plan: Earl [name] comes in today with chief complaint of No chief complaint on file.   Diagnosis and orders addressed:  1. Atypical atrial flutter ([place])  2. Essential hypertension  3. NSVT (nonsustained ventricular tachycardia) (H[facility])  4. Subclinical hypothyroidism  5. Neuropathy  6. Alcohol use  7. Depression, recurrent ([place])  8. ETOHism ([place])  9. Morbid obesity ([place])  10. Gastroesophageal reflux disease, unspecified whether esophagitis present  11. Other ascites - spironolactone (ALDACTONE) 50 MG tablet; Take 1 tablet (50 mg total) by mouth daily.  Dispense: 30 tablet; Refill: 2 - furosemide (LASIX) 20 MG tablet; Take 1 tablet (20 mg total) by mouth daily.  Dispense: 30 tablet; Refill: 2  12. Malignant neoplasm of sigmoid colon ([place])  13. Cirrhosis of liver with ascites, unspecified hepatic cirrhosis type ([facility]) - spironolactone (ALDACTONE) 50 MG tablet; Take 1 tablet (50 mg total) by mouth daily.  Dispense: 30 tablet; Refill: 2 - furosemide (LASIX) 20 MG tablet; Take 1 tablet (20 mg total) by mouth daily.  Dispense: 30 tablet; Refill: 2   Labs reviewed in [place] Maintenance reviewed Diet and exercise encouraged  Follow up plan: 3 months and keep specialists appt  I discussed the assessment and treatment plan with the patient. The patient was provided an opportunity to ask questions and all were answered. The patient agreed with the plan and demonstrated  an understanding of the instructions.   The patient was advised to call back or seek an in-person evaluation if the symptoms worsen or if the condition fails to improve as anticipated.  The above assessment and management plan was discussed with the patient. The patient verbalized understanding of and has agreed to the management plan. Patient is aware to call the clinic if symptoms persist or worsen. Patient is aware when to return to the clinic for a follow-up visit. Patient educated on when it is appropriate to go to the emergency department.   Time call ended:  9:05 AM  I provided 20 minutes of non-face-to-face time during this encounter.    Evelina Dun, FNP

## 2019-11-22 ENCOUNTER — Other Ambulatory Visit: Payer: Self-pay

## 2019-11-22 DIAGNOSIS — E039 Hypothyroidism, unspecified: Secondary | ICD-10-CM

## 2019-11-23 ENCOUNTER — Ambulatory Visit: Payer: Medicaid Other | Admitting: Nurse Practitioner

## 2019-11-26 LAB — CUP PACEART REMOTE DEVICE CHECK
Date Time Interrogation Session: 20211026000117
Implantable Pulse Generator Implant Date: 20201228

## 2019-11-28 ENCOUNTER — Ambulatory Visit (INDEPENDENT_AMBULATORY_CARE_PROVIDER_SITE_OTHER): Payer: Medicaid Other

## 2019-11-28 DIAGNOSIS — Z7689 Persons encountering health services in other specified circumstances: Secondary | ICD-10-CM | POA: Diagnosis not present

## 2019-11-28 DIAGNOSIS — Z79891 Long term (current) use of opiate analgesic: Secondary | ICD-10-CM | POA: Diagnosis not present

## 2019-11-28 DIAGNOSIS — M545 Low back pain, unspecified: Secondary | ICD-10-CM | POA: Diagnosis not present

## 2019-11-28 DIAGNOSIS — F419 Anxiety disorder, unspecified: Secondary | ICD-10-CM | POA: Diagnosis not present

## 2019-11-28 DIAGNOSIS — F331 Major depressive disorder, recurrent, moderate: Secondary | ICD-10-CM | POA: Diagnosis not present

## 2019-11-28 DIAGNOSIS — R002 Palpitations: Secondary | ICD-10-CM | POA: Diagnosis not present

## 2019-11-28 DIAGNOSIS — M542 Cervicalgia: Secondary | ICD-10-CM | POA: Diagnosis not present

## 2019-11-28 DIAGNOSIS — G609 Hereditary and idiopathic neuropathy, unspecified: Secondary | ICD-10-CM | POA: Diagnosis not present

## 2019-11-28 DIAGNOSIS — M25519 Pain in unspecified shoulder: Secondary | ICD-10-CM | POA: Diagnosis not present

## 2019-11-30 NOTE — Progress Notes (Signed)
Carelink Summary Report / Loop Recorder 

## 2019-12-02 ENCOUNTER — Telehealth: Payer: Self-pay

## 2019-12-02 ENCOUNTER — Encounter: Payer: Self-pay | Admitting: Internal Medicine

## 2019-12-02 ENCOUNTER — Ambulatory Visit (INDEPENDENT_AMBULATORY_CARE_PROVIDER_SITE_OTHER): Payer: Medicaid Other | Admitting: Internal Medicine

## 2019-12-02 VITALS — BP 108/68 | HR 70 | Ht 70.0 in | Wt 236.0 lb

## 2019-12-02 DIAGNOSIS — E538 Deficiency of other specified B group vitamins: Secondary | ICD-10-CM

## 2019-12-02 DIAGNOSIS — I471 Supraventricular tachycardia: Secondary | ICD-10-CM | POA: Diagnosis not present

## 2019-12-02 DIAGNOSIS — I10 Essential (primary) hypertension: Secondary | ICD-10-CM

## 2019-12-02 DIAGNOSIS — F1010 Alcohol abuse, uncomplicated: Secondary | ICD-10-CM | POA: Diagnosis not present

## 2019-12-02 DIAGNOSIS — Z7689 Persons encountering health services in other specified circumstances: Secondary | ICD-10-CM | POA: Diagnosis not present

## 2019-12-02 NOTE — Patient Instructions (Addendum)

## 2019-12-02 NOTE — Telephone Encounter (Signed)
Pt called stating that he wants to start getting B12 shots and is ok with paying for them out of pocket if his insurance doesn't cover it. Says he was told the last time his B12 was checked, that his level wasn't low enough to get B12 shots but pt feels like his level is low and should be in the 1000-2000 range. Pt requesting that Alyse Low put order in for him to have his B12 rechecked so that he can start getting injections.  Please advise.

## 2019-12-02 NOTE — Progress Notes (Signed)
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 male who presents today for routine electrophysiology followup.  Since last being seen in our clinic, the patient reports doing very well.  No recent arrhythmias.  He is trying to keep ETOH low.  He struggles with depression.  Today, he denies symptoms of palpitations, chest pain, shortness of breath,  lower extremity edema, dizziness, presyncope, or syncope.  The patient is otherwise without complaint today.   Past Medical History:  Diagnosis Date  . Alcohol abuse   . Anxiety   . Arthritis   . Colon cancer ([place])   . Depression   . Gall stones   . Heart burn   . Hyperlipidemia   . Hypertension   . Hypothyroidism   . Kidney stone   . Liver disease   . Tachycardia   . Typical atrial flutter [facility])    Past Surgical History:  Procedure Laterality Date  . A-FLUTTER ABLATION N/A 07/31/2016   Procedure: A-Flutter Ablation;  Surgeon: [name], MD;  Location: [place] [facility];  Service: Cardiovascular;  Laterality: N/A;  . COLONOSCOPY N/A 04/17/2016   Procedure: COLONOSCOPY;  Surgeon: [name], MD;  [facility];  Service: Endoscopy;  Laterality: N/A;  1200  . COLONOSCOPY N/A 05/07/2017   Procedure: COLONOSCOPY;  Surgeon: [name], MD;  [facility];  Service: Endoscopy;  Laterality: N/A;  200  . implantable loop recorder placement  01/24/2019    Medtronic Reveal Linq model LNQ 11 implantable loop recorder (SN [id number] ) by Dr Rayann Heman for evaluation of palpitations  . MINOR CARPAL TUNNEL     right  . POLYPECTOMY  04/17/2016   Procedure: POLYPECTOMY;  Surgeon: [name], MD;  [facility];  Service: Endoscopy;;  sigmoid    ROS- all systems are reviewed and negatives except as per HPI above  Current Outpatient Medications  Medication Sig Dispense Refill  . ALPRAZolam (XANAX) 1 MG tablet Take 1 mg by mouth 2 (two) times daily as needed for  anxiety.     . ergocalciferol (VITAMIN D2) 1.25 MG (50000 UT) capsule Vitamin D2 1,250 mcg (50,000 unit) capsule  TAKE 1 CAPSULE BY MOUTH EVERY WEEK    . furosemide (LASIX) 20 MG tablet Take 1 tablet (20 mg total) by mouth daily. 30 tablet 2  . levothyroxine (SYNTHROID) 50 MCG tablet Take 50 mcg by mouth daily before breakfast.    . LYRICA 200 MG capsule Take 400 mg by mouth daily.   2  . Magnesium Hydroxide (MAGNESIA PO) Take 1 tablet by mouth daily.    . ondansetron (ZOFRAN) 4 MG tablet Take 1 tablet (4 mg total) by mouth every 8 (eight) hours as needed for nausea or vomiting. 20 tablet 0  . pantoprazole (PROTONIX) 40 MG tablet pantoprazole 40 mg tablet,delayed release  TAKE 1 TABLET BY MOUTH ONCE DAILY BEFORE BREAKFAST    . spironolactone (ALDACTONE) 50 MG tablet Take 1 tablet (50 mg total) by mouth daily. 30 tablet 2  . zinc gluconate 50 MG tablet Take 50 mg by mouth every 30 (thirty) days. Take with vitamin d    . diltiazem (CARDIZEM CD) 300 MG 24 hr capsule Take 1 capsule (300 mg total) by mouth daily. 30 capsule 3   No current facility-administered medications for this visit.    Physical Exam: Vitals:   12/02/19 0951  BP: 108/68  Pulse: 70  SpO2: 94%  Weight: 236 lb (107 kg)  Height: 5\' 10"  (1.778 m)    GEN- The patient is well appearing, alert and oriented x 3 today.   Head- normocephalic, atraumatic Eyes-  Sclera clear, conjunctiva pink Ears- hearing intact Oropharynx- clear Lungs- Clear to ausculation bilaterally, normal work of breathing Heart- Regular rate and rhythm, no murmurs, rubs or gallops, PMI not laterally displaced GI- soft, NT, ND, + BS Extremities- no clubbing, cyanosis, or edema  Wt Readings from Last 3 Encounters:  12/02/19 236 lb (107 kg)  08/23/19 (!) 243 lb 9.6 oz (110.5 kg)  08/18/19 (!) 242 lb (109.8 kg)     Assessment and Plan:  1. SVT Doing well Followed with ILR which shows no recent arrhythmias. He is s/p prior atach and CTI  ablation  2. HTN Stable No change required today  3. ETOH Avoidance encouraged  4. Overweight Body mass index is 33.86 kg/m. Lifestyle modification advised  Risks, benefits and potential toxicities for medications prescribed and/or refilled reviewed with patient today.   Return in 6 months  [name] MD, [facility] 12/02/2019 10:29 AM

## 2019-12-05 NOTE — Telephone Encounter (Signed)
Orders placed for lab work. 

## 2019-12-05 NOTE — Telephone Encounter (Signed)
{  Patient wants injection will pay out of pocket. Advised he would need levels checked and patient said he does not want that he just wants [place] to order injections. Advised we had to have a level and if it was normal then we would not order the injections. Patient states he wants to speak with someone because he knows we can give him this injection with out the lab order. Please advise.

## 2019-12-05 NOTE — Telephone Encounter (Signed)
Patient aware that he would have to have a B12 level drawn. Patient aware that orders are placed.

## 2019-12-13 DIAGNOSIS — F319 Bipolar disorder, unspecified: Secondary | ICD-10-CM | POA: Diagnosis not present

## 2019-12-14 DIAGNOSIS — F319 Bipolar disorder, unspecified: Secondary | ICD-10-CM | POA: Diagnosis not present

## 2019-12-15 ENCOUNTER — Ambulatory Visit (INDEPENDENT_AMBULATORY_CARE_PROVIDER_SITE_OTHER): Payer: Medicaid Other | Admitting: Gastroenterology

## 2019-12-19 DIAGNOSIS — Z7689 Persons encountering health services in other specified circumstances: Secondary | ICD-10-CM | POA: Diagnosis not present

## 2019-12-19 DIAGNOSIS — F319 Bipolar disorder, unspecified: Secondary | ICD-10-CM | POA: Diagnosis not present

## 2019-12-20 ENCOUNTER — Ambulatory Visit (INDEPENDENT_AMBULATORY_CARE_PROVIDER_SITE_OTHER): Payer: Medicaid Other | Admitting: Gastroenterology

## 2019-12-23 DIAGNOSIS — F319 Bipolar disorder, unspecified: Secondary | ICD-10-CM | POA: Diagnosis not present

## 2019-12-29 DIAGNOSIS — Z7689 Persons encountering health services in other specified circumstances: Secondary | ICD-10-CM | POA: Diagnosis not present

## 2019-12-30 LAB — CUP PACEART REMOTE DEVICE CHECK
Date Time Interrogation Session: 20211127231246
Implantable Pulse Generator Implant Date: 20201228

## 2020-01-02 ENCOUNTER — Ambulatory Visit (INDEPENDENT_AMBULATORY_CARE_PROVIDER_SITE_OTHER): Payer: Medicaid Other

## 2020-01-02 ENCOUNTER — Telehealth (INDEPENDENT_AMBULATORY_CARE_PROVIDER_SITE_OTHER): Payer: Self-pay | Admitting: *Deleted

## 2020-01-02 DIAGNOSIS — R002 Palpitations: Secondary | ICD-10-CM | POA: Diagnosis not present

## 2020-01-02 NOTE — Telephone Encounter (Signed)
I do not see that the patient has any labs ordered by one of our providers. I did see that he had appointment with Thayer Headings for 12-20-19 and this was cancelled. Patient will need a 3 month appointment for follow up Cirrhosis.

## 2020-01-02 NOTE — Telephone Encounter (Signed)
Forwarded to [employer] to schedule f/u apt

## 2020-01-02 NOTE — Telephone Encounter (Signed)
Patient left message, feels like he is overdue for labs -

## 2020-01-03 ENCOUNTER — Ambulatory Visit (INDEPENDENT_AMBULATORY_CARE_PROVIDER_SITE_OTHER): Payer: Medicaid Other | Admitting: Family

## 2020-01-03 ENCOUNTER — Encounter: Payer: Self-pay | Admitting: Family

## 2020-01-03 DIAGNOSIS — F339 Major depressive disorder, recurrent, unspecified: Secondary | ICD-10-CM

## 2020-01-03 DIAGNOSIS — I484 Atypical atrial flutter: Secondary | ICD-10-CM

## 2020-01-03 DIAGNOSIS — E559 Vitamin D deficiency, unspecified: Secondary | ICD-10-CM

## 2020-01-03 DIAGNOSIS — E782 Mixed hyperlipidemia: Secondary | ICD-10-CM

## 2020-01-03 DIAGNOSIS — E038 Other specified hypothyroidism: Secondary | ICD-10-CM

## 2020-01-03 DIAGNOSIS — I471 Supraventricular tachycardia: Secondary | ICD-10-CM | POA: Diagnosis not present

## 2020-01-03 DIAGNOSIS — E538 Deficiency of other specified B group vitamins: Secondary | ICD-10-CM

## 2020-01-03 DIAGNOSIS — I10 Essential (primary) hypertension: Secondary | ICD-10-CM

## 2020-01-03 DIAGNOSIS — Z91199 Patient's noncompliance with other medical treatment and regimen due to unspecified reason: Secondary | ICD-10-CM

## 2020-01-03 DIAGNOSIS — Z5329 Procedure and treatment not carried out because of patient's decision for other reasons: Secondary | ICD-10-CM

## 2020-01-03 NOTE — Progress Notes (Signed)
Virtual Visit via telephone Note Due to COVID-19 pandemic this visit was conducted virtually. This visit type was conducted due to national recommendations for restrictions regarding the COVID-19 Pandemic (e.g. social distancing, sheltering in place) in an effort to limit this patient's exposure and mitigate transmission in our community. All issues noted in this document were discussed and addressed.  A physical exam was not performed with this format.  I connected with Earl [name] on 01/03/20 at 1:05 pm  by telephone and verified that I am speaking with the correct person using two identifiers. Earl [name] is currently located at home and no one is currently with him  during visit. The provider, Evelina Dun, FNP is located in their office at time of visit.  I discussed the limitations, risks, security and privacy concerns of performing an evaluation and management service by telephone and the availability of in person appointments. I also discussed with the patient that there may be a patient responsible charge related to this service. The patient expressed understanding and agreed to proceed.   History and Present Illness:  Pt calls the office today for chronic follow up. He is followed by GI for alcoholic cirrhosis and malignant neoplasm of colon. He is followed by Pain Clinic once a month for chronic pain and neuropathy. He is followed by Cardiologists for NSVT and atrial flutter.   He states he is drinking 24 oz a beer a day.  Hypertension This is a chronic problem. The current episode started more than 1 year ago. Associated symptoms include malaise/fatigue. Pertinent negatives include no peripheral edema or shortness of breath. Risk factors for coronary artery disease include dyslipidemia, obesity, male gender and sedentary lifestyle. Identifiable causes of hypertension include a thyroid problem.  Thyroid Problem Presents for follow-up visit. Symptoms include anxiety,  constipation, depressed mood, dry skin and fatigue. Patient reports no diaphoresis. The symptoms have been stable.  Anemia Presents for follow-up visit. Symptoms include malaise/fatigue.      Review of Systems  Constitutional: Positive for fatigue and malaise/fatigue. Negative for diaphoresis.  Respiratory: Negative for shortness of breath.   Gastrointestinal: Positive for constipation.  Psychiatric/Behavioral: The patient is nervous/anxious.   All other systems reviewed and are negative.    Observations/Objective: No SOB or distress, anxious   Assessment and Plan: Earl [name] comes in today with chief complaint of No chief complaint on file.   Diagnosis and orders addressed:  1. Essential hypertension - CMP14+EGFR; Future  2. Atypical atrial flutter (HCC) - CMP14+EGFR; Future  3. SVT (supraventricular tachycardia) (HCC) - CMP14+EGFR; Future  4. Subclinical hypothyroidism - CMP14+EGFR; Future - TSH; Future  5. Depression, recurrent ([place]) - CMP14+EGFR; Future  6. Vitamin D deficiency - Anemia Profile B; Future - CMP14+EGFR; Future  7. Vitamin B 12 deficiency - Anemia Profile B; Future - CMP14+EGFR; Future  8. Noncompliance by refusing service - CMP14+EGFR; Future  9. Morbid obesity ([place]) - CMP14+EGFR; Future  10. Mixed hyperlipidemia - CMP14+EGFR; Future   Labs pending Health Maintenance reviewed Diet and exercise encouraged  Follow up plan: 3 months      I discussed the assessment and treatment plan with the patient. The patient was provided an opportunity to ask questions and all were answered. The patient agreed with the plan and demonstrated an understanding of the instructions.   The patient was advised to call back or seek an in-person evaluation if the symptoms worsen or if the condition fails to improve as anticipated.  The above assessment  and management plan was discussed with the patient. The patient verbalized understanding of and  has agreed to the management plan. Patient is aware to call the clinic if symptoms persist or worsen. Patient is aware when to return to the clinic for a follow-up visit. Patient educated on when it is appropriate to go to the emergency department.   Time call ended: 1:27 pm    I provided 21 minutes of non-face-to-face time during this encounter.    Evelina Dun, FNP

## 2020-01-05 DIAGNOSIS — Z7689 Persons encountering health services in other specified circumstances: Secondary | ICD-10-CM | POA: Diagnosis not present

## 2020-01-09 DIAGNOSIS — M542 Cervicalgia: Secondary | ICD-10-CM | POA: Diagnosis not present

## 2020-01-09 DIAGNOSIS — Z79891 Long term (current) use of opiate analgesic: Secondary | ICD-10-CM | POA: Diagnosis not present

## 2020-01-09 DIAGNOSIS — M545 Low back pain, unspecified: Secondary | ICD-10-CM | POA: Diagnosis not present

## 2020-01-09 DIAGNOSIS — Z7689 Persons encountering health services in other specified circumstances: Secondary | ICD-10-CM | POA: Diagnosis not present

## 2020-01-11 NOTE — Progress Notes (Signed)
Carelink Summary Report / Loop Recorder 

## 2020-01-17 ENCOUNTER — Emergency Department (HOSPITAL_COMMUNITY): Payer: Medicaid Other

## 2020-01-17 ENCOUNTER — Emergency Department (HOSPITAL_COMMUNITY)
Admission: EM | Admit: 2020-01-17 | Discharge: 2020-01-18 | Disposition: A | Discharge status: Home / Self Care | Payer: Medicaid Other | Attending: Emergency Medicine | Admitting: Emergency Medicine

## 2020-01-17 ENCOUNTER — Encounter (HOSPITAL_COMMUNITY): Payer: Self-pay | Admitting: Emergency Medicine

## 2020-01-17 ENCOUNTER — Other Ambulatory Visit: Payer: Self-pay

## 2020-01-17 DIAGNOSIS — Z87891 Personal history of nicotine dependence: Secondary | ICD-10-CM | POA: Diagnosis not present

## 2020-01-17 DIAGNOSIS — Z85038 Personal history of other malignant neoplasm of large intestine: Secondary | ICD-10-CM | POA: Insufficient documentation

## 2020-01-17 DIAGNOSIS — N179 Acute kidney failure, unspecified: Secondary | ICD-10-CM | POA: Diagnosis not present

## 2020-01-17 DIAGNOSIS — R1084 Generalized abdominal pain: Secondary | ICD-10-CM | POA: Diagnosis not present

## 2020-01-17 DIAGNOSIS — I10 Essential (primary) hypertension: Secondary | ICD-10-CM | POA: Diagnosis not present

## 2020-01-17 DIAGNOSIS — Z79899 Other long term (current) drug therapy: Secondary | ICD-10-CM | POA: Diagnosis not present

## 2020-01-17 DIAGNOSIS — K7030 Alcoholic cirrhosis of liver without ascites: Secondary | ICD-10-CM | POA: Diagnosis not present

## 2020-01-17 DIAGNOSIS — K7460 Unspecified cirrhosis of liver: Secondary | ICD-10-CM | POA: Diagnosis not present

## 2020-01-17 DIAGNOSIS — N200 Calculus of kidney: Secondary | ICD-10-CM | POA: Diagnosis not present

## 2020-01-17 DIAGNOSIS — K766 Portal hypertension: Secondary | ICD-10-CM | POA: Diagnosis not present

## 2020-01-17 DIAGNOSIS — E039 Hypothyroidism, unspecified: Secondary | ICD-10-CM | POA: Diagnosis not present

## 2020-01-17 DIAGNOSIS — I959 Hypotension, unspecified: Secondary | ICD-10-CM | POA: Diagnosis not present

## 2020-01-17 DIAGNOSIS — R1011 Right upper quadrant pain: Secondary | ICD-10-CM | POA: Diagnosis present

## 2020-01-17 DIAGNOSIS — R52 Pain, unspecified: Secondary | ICD-10-CM | POA: Diagnosis not present

## 2020-01-17 LAB — CBC WITH DIFFERENTIAL/PLATELET
Abs Immature Granulocytes: 0.04 10*3/uL (ref 0.00–0.07)
Basophils Absolute: 0.1 10*3/uL (ref 0.0–0.1)
Basophils Relative: 1 %
Eosinophils Absolute: 0.2 10*3/uL (ref 0.0–0.5)
Eosinophils Relative: 2 %
HCT: 47.5 % (ref 39.0–52.0)
Hemoglobin: 16 g/dL (ref 13.0–17.0)
Immature Granulocytes: 0 %
Lymphocytes Relative: 27 %
Lymphs Abs: 3.3 10*3/uL (ref 0.7–4.0)
MCH: 33.3 pg (ref 26.0–34.0)
MCHC: 33.7 g/dL (ref 30.0–36.0)
MCV: 98.8 fL (ref 80.0–100.0)
Monocytes Absolute: 1.1 10*3/uL — ABNORMAL HIGH (ref 0.1–1.0)
Monocytes Relative: 9 %
Neutro Abs: 7.6 10*3/uL (ref 1.7–7.7)
Neutrophils Relative %: 61 %
Platelets: 219 10*3/uL (ref 150–400)
RBC: 4.81 MIL/uL (ref 4.22–5.81)
RDW: 13.2 % (ref 11.5–15.5)
WBC: 12.4 10*3/uL — ABNORMAL HIGH (ref 4.0–10.5)
nRBC: 0 % (ref 0.0–0.2)

## 2020-01-17 LAB — COMPREHENSIVE METABOLIC PANEL
ALT: 41 U/L (ref 0–44)
AST: 48 U/L — ABNORMAL HIGH (ref 15–41)
Albumin: 4 g/dL (ref 3.5–5.0)
Alkaline Phosphatase: 77 U/L (ref 38–126)
Anion gap: 9 (ref 5–15)
BUN: 38 mg/dL — ABNORMAL HIGH (ref 6–20)
CO2: 23 mmol/L (ref 22–32)
Calcium: 8.8 mg/dL — ABNORMAL LOW (ref 8.9–10.3)
Chloride: 101 mmol/L (ref 98–111)
Creatinine, Ser: 1.74 mg/dL — ABNORMAL HIGH (ref 0.61–1.24)
GFR, Estimated: 44 mL/min — ABNORMAL LOW (ref >60)
Glucose, Bld: 93 mg/dL (ref 70–99)
Potassium: 4 mmol/L (ref 3.5–5.1)
Sodium: 133 mmol/L — ABNORMAL LOW (ref 135–145)
Total Bilirubin: 1.4 mg/dL — ABNORMAL HIGH (ref 0.3–1.2)
Total Protein: 7.9 g/dL (ref 6.5–8.1)

## 2020-01-17 LAB — URINALYSIS, ROUTINE W REFLEX MICROSCOPIC
Bilirubin Urine: NEGATIVE
Glucose, UA: NEGATIVE mg/dL
Hgb urine dipstick: NEGATIVE
Ketones, ur: NEGATIVE mg/dL
Leukocytes,Ua: NEGATIVE
Nitrite: NEGATIVE
Protein, ur: NEGATIVE mg/dL
Specific Gravity, Urine: 1.011 (ref 1.005–1.030)
pH: 6 (ref 5.0–8.0)

## 2020-01-17 LAB — PROTIME-INR
INR: 1.2 (ref 0.8–1.2)
Prothrombin Time: 14.2 seconds (ref 11.4–15.2)

## 2020-01-17 LAB — AMMONIA: Ammonia: 48 umol/L — ABNORMAL HIGH (ref 9–35)

## 2020-01-17 LAB — LIPASE, BLOOD: Lipase: 66 U/L — ABNORMAL HIGH (ref 11–51)

## 2020-01-17 NOTE — ED Triage Notes (Signed)
Pt c/o left upper abd pain x 5 days. Pt states it is his liver.

## 2020-01-17 NOTE — Discharge Instructions (Signed)
Only take your lasix and aldactone once a day.

## 2020-01-17 NOTE — ED Provider Notes (Signed)
[facility] EMERGENCY DEPARTMENT Provider Note   CSN: [id number] Arrival date & time: 01/17/20  2006     History Chief Complaint  Patient presents with  . Abdominal Pain    Earl [name] is a 59 y.o. male.  Pt presents to the ED today with RUQ abdominal pain.  He feels like his liver is inflamed.  He has a hx of alcoholic cirrhosis.  He said he's had pain for 5 days.  He denies any f/c.  No n/v.  He's been compliant with his meds.  He's been sober since his d/c from the hospital in June.  He follows with Dr. Laural Golden for his liver.        Past Medical History:  Diagnosis Date  . Alcohol abuse   . Anxiety   . Arthritis   . Colon cancer ([place])   . Depression   . Gall stones   . Heart burn   . Hyperlipidemia   . Hypertension   . Hypothyroidism   . Kidney stone   . Liver disease   . Tachycardia   . Typical atrial flutter [facility])     Patient Active Problem List   Diagnosis Date Noted  . Malignant neoplasm of sigmoid colon ([place]) 11/10/2019  . SVT (supraventricular tachycardia) ([place])   . NSVT (nonsustained ventricular tachycardia) ([place])   . Lactic acidosis 07/26/2019  . Other ascites 07/26/2019  . Upper abdominal pain, unspecified 03/31/2019  . Nausea without vomiting 03/31/2019  . Noncompliance by refusing service 03/02/2019  . ETOHism ([place]) 02/04/2019  . Depression, recurrent ([place]) 01/05/2019  . Morbid obesity ([place]) 10/13/2018  . Mixed hyperlipidemia 08/17/2018  . Vitamin D deficiency 08/17/2018  . Subclinical hypothyroidism 08/17/2018  . Abnormal LFTs 07/29/2018  . Numbness 05/19/2017  . History of alcohol abuse 05/19/2017  . History of colon cancer 04/16/2017  . Palpitations 03/09/2017  . Atrial flutter ([place]) 03/09/2017  . Family hx of colon cancer 02/04/2016  . Hematuria 11/30/2015  . Hematochezia 11/30/2015  . Family history of colon cancer 09/19/2015  . Neuropathy 08/30/2015  . Rectal bleeding 08/30/2015  . Essential hypertension 08/30/2015  .  Abnormal brain MRI 08/30/2015  . Vitamin B 12 deficiency 08/30/2015    Past Surgical History:  Procedure Laterality Date  . A-FLUTTER ABLATION N/A 07/31/2016   Procedure: A-Flutter Ablation;  Surgeon: [name], MD;  Location: [place] [facility];  Service: Cardiovascular;  Laterality: N/A;  . COLONOSCOPY N/A 04/17/2016   Procedure: COLONOSCOPY;  Surgeon: [name], MD;  [facility];  Service: Endoscopy;  Laterality: N/A;  1200  . COLONOSCOPY N/A 05/07/2017   Procedure: COLONOSCOPY;  Surgeon: [name], MD;  [facility];  Service: Endoscopy;  Laterality: N/A;  200  . implantable loop recorder placement  01/24/2019    Medtronic Reveal Linq model LNQ 11 implantable loop recorder (SN [id number] ) by Dr Rayann Heman for evaluation of palpitations  . MINOR CARPAL TUNNEL     right  . POLYPECTOMY  04/17/2016   Procedure: POLYPECTOMY;  Surgeon: [name], MD;  [facility];  Service: Endoscopy;;  sigmoid       Family History  Problem Relation Age of Onset  . Alzheimer's disease Mother   . Heart disease Father   . Coronary artery disease Father   . Liver disease Father   . Thyroid disease Father   . Hyperlipidemia Father   . Heart attack Father   . Coronary artery disease Paternal Grandfather  Social History   Tobacco Use  . Smoking status: Former Smoker    Quit date: 03/03/1979    Years since quitting: 40.9  . Smokeless tobacco: Never Used  Vaping Use  . Vaping Use: Never used  Substance Use Topics  . Alcohol use: Not Currently    Alcohol/week: 8.0 standard drinks    Types: 8 Cans of beer per week    Comment: Patient states that he stopped 1 month ago  . Drug use: No    Home Medications Prior to Admission medications   Medication Sig Start Date End Date Taking? Authorizing Provider  ALPRAZolam Duanne Moron) 1 MG tablet Take 1 mg by mouth 2 (two) times daily as needed for anxiety.     [provider]  diltiazem (CARDIZEM  CD) 300 MG 24 hr capsule Take 1 capsule (300 mg total) by mouth daily. 07/31/19 11/10/19  Manuella Ghazi, Pratik D, DO  ergocalciferol (VITAMIN D2) 1.25 MG (50000 UT) capsule Vitamin D2 1,250 mcg (50,000 unit) capsule  TAKE 1 CAPSULE BY MOUTH EVERY WEEK    [provider]  furosemide (LASIX) 20 MG tablet Take 1 tablet (20 mg total) by mouth daily. 11/10/19 12/10/19  Sharion Balloon, FNP  levothyroxine (SYNTHROID) 50 MCG tablet Take 50 mcg by mouth daily before breakfast.    [provider]  LYRICA 200 MG capsule Take 400 mg by mouth daily.  07/27/17   [provider]  Magnesium Hydroxide (MAGNESIA PO) Take 1 tablet by mouth daily.    [provider]  ondansetron (ZOFRAN) 4 MG tablet Take 1 tablet (4 mg total) by mouth every 8 (eight) hours as needed for nausea or vomiting. 07/04/19   Evelina Dun A, FNP  pantoprazole (PROTONIX) 40 MG tablet pantoprazole 40 mg tablet,delayed release  TAKE 1 TABLET BY MOUTH ONCE DAILY BEFORE BREAKFAST    [provider]  spironolactone (ALDACTONE) 50 MG tablet Take 1 tablet (50 mg total) by mouth daily. 11/10/19 12/10/19  Evelina Dun A, FNP  zinc gluconate 50 MG tablet Take 50 mg by mouth every 30 (thirty) days. Take with vitamin d    [provider]    Allergies    Amitriptyline, Amoxicillin, Asa [aspirin], Penicillin g, Clonidine derivatives, Hydrochlorothiazide, Levothyroxine, and Trintellix [vortioxetine]  Review of Systems   Review of Systems  Gastrointestinal: Positive for abdominal pain.  All other systems reviewed and are negative.   Physical Exam Updated Vital Signs BP 96/81 (BP Location: Right Arm)   Pulse (!) 59   Temp 98 F (36.7 C) (Oral)   Resp 16   Ht 5\' 10"  (1.778 m)   Wt 106.6 kg   SpO2 98%   BMI 33.72 kg/m   Physical Exam Vitals and nursing note reviewed.  Constitutional:      Appearance: He is well-developed. He is obese.  HENT:     Head: Normocephalic and atraumatic.      Mouth/Throat:     Mouth: Mucous membranes are moist.     Pharynx: Oropharynx is clear.  Eyes:     Extraocular Movements: Extraocular movements intact.     Pupils: Pupils are equal, round, and reactive to light.  Cardiovascular:     Rate and Rhythm: Normal rate and regular rhythm.  Pulmonary:     Effort: Pulmonary effort is normal.     Breath sounds: Normal breath sounds.  Abdominal:     General: Abdomen is flat.     Palpations: Abdomen is soft. There is hepatomegaly.  Tenderness: There is abdominal tenderness in the right upper quadrant.  Skin:    General: Skin is warm.     Capillary Refill: Capillary refill takes less than 2 seconds.  Neurological:     General: No focal deficit present.     Mental Status: He is alert and oriented to person, place, and time.  Psychiatric:        Mood and Affect: Mood is anxious.        Behavior: Behavior normal.     ED Results / Procedures / Treatments   Labs (all labs ordered are listed, but only abnormal results are displayed) Labs Reviewed  CBC WITH DIFFERENTIAL/PLATELET - Abnormal; Notable for the following components:      Result Value   WBC 12.4 (*)    Monocytes Absolute 1.1 (*)    All other components within normal limits  COMPREHENSIVE METABOLIC PANEL - Abnormal; Notable for the following components:   Sodium 133 (*)    BUN 38 (*)    Creatinine, Ser 1.74 (*)    Calcium 8.8 (*)    AST 48 (*)    Total Bilirubin 1.4 (*)    GFR, Estimated 44 (*)    All other components within normal limits  LIPASE, BLOOD - Abnormal; Notable for the following components:   Lipase 66 (*)    All other components within normal limits  AMMONIA - Abnormal; Notable for the following components:   Ammonia 48 (*)    All other components within normal limits  URINALYSIS, ROUTINE W REFLEX MICROSCOPIC  PROTIME-INR    EKG None  Radiology CT ABDOMEN PELVIS WO CONTRAST  Result Date: 01/17/2020 CLINICAL DATA:  59 year old male with acute abdominal  pain. EXAM: CT ABDOMEN AND PELVIS WITHOUT CONTRAST TECHNIQUE: Multidetector CT imaging of the abdomen and pelvis was performed following the standard protocol without IV contrast. COMPARISON:  CT abdomen pelvis dated 07/26/2019. FINDINGS: Evaluation of this exam is limited in the absence of intravenous contrast. Lower chest: The visualized lung bases are clear. No intra-abdominal free air or free fluid. Hepatobiliary: Cirrhosis. No intrahepatic biliary dilatation. No calcified gallstone or pericholecystic fluid. Pancreas: Unremarkable. No pancreatic ductal dilatation or surrounding inflammatory changes. Spleen: Top-normal spleen size measuring 13 cm in length. Adrenals/Urinary Tract: The adrenal glands unremarkable. There is a 7 mm nonobstructing right renal upper pole calculus and a punctate nonobstructing right renal inferior pole stone. A 2 mm nonobstructing stone is also noted in the inferior pole of the left kidney. There is no hydronephrosis on either side. The visualized ureters appear unremarkable. The urinary bladder is collapsed. There is apparent diffuse thickening of the bladder wall which may be partly related to underdistention. Cystitis is not excluded. Correlation with urinalysis recommended. Stomach/Bowel: Small scattered sigmoid diverticula without active inflammatory changes. There is no bowel obstruction or active inflammation. The appendix is normal. Vascular/Lymphatic: Mild aortoiliac atherosclerotic disease. The IVC is unremarkable. No portal gas. There is mild diffuse mesenteric edema. Interval resolution of the previously seen ascites. Small upper abdominal varices noted. Reproductive: The prostate and seminal vesicles are grossly unremarkable. Other: Small fat containing umbilical hernia. Musculoskeletal: No acute or significant osseous findings. IMPRESSION: 1. Nonobstructing bilateral renal calculi. No hydronephrosis. 2. Cirrhosis with evidence of portal hypertension and small upper  abdominal varices. Interval resolution of the previously seen ascites. 3. No bowel obstruction. Normal appendix. 4. Aortic Atherosclerosis (ICD10-I70.0). Electronically Signed   By: Anner Crete M.D.   On: 01/17/2020 22:26    Procedures Procedures (including critical  care time)  Medications Ordered in ED Medications - No data to display  ED Course  I have reviewed the triage vital signs and the nursing notes.  Pertinent labs & imaging results that were available during my care of the patient were reviewed by me and considered in my medical decision making (see chart for details).    MDM Rules/Calculators/A&P                          Pt's CT shows resolution of his ascites.  His cr is elevated.  He told me that he's been taking his lasix and aldactone twice a day instead of the once a day he's supposed to take them.  He is told to only take them once a day.  He is stable for d/c.  Return if worse.  Final Clinical Impression(s) / ED Diagnoses Final diagnoses:  Alcoholic cirrhosis of liver without ascites ([place])  AKI (acute kidney injury) [facility])    Rx / [place] Orders ED Discharge Orders    None       Isla Pence, MD 01/17/20 2258

## 2020-01-18 NOTE — ED Notes (Signed)
Pt reports he does not have a ride home. Advised pt he can wait in ED waiting room until he can arrange for a ride home. Pt verbalizes understanding.

## 2020-01-19 ENCOUNTER — Telehealth: Payer: Self-pay

## 2020-01-19 NOTE — Telephone Encounter (Signed)
Transition Care Management Follow-up Telephone Call  Date of discharge and from where: 01/18/2020 [name]  How have you been since you were released from the hospital? Doing better.   Any questions or concerns? No  Items Reviewed:  Did the pt receive and understand the discharge instructions provided? Yes   Medications obtained and verified? No medication given at discharge,  Other? No   Any new allergies since your discharge? No   Dietary orders reviewed? No  Do you have support at home? Yes    Functional Questionnaire: (I = Independent and D = Dependent) ADLs: I  Bathing/Dressing- I  Meal Prep- I  Eating- I  Maintaining continence- I  Transferring/Ambulation- I  Managing Meds- I  Follow up appointments reviewed:   PCP Hospital f/u appt confirmed? No  Patient rather make an appointment with Earl [name] at [facility] f/u appt confirmed? Yes  Scheduled to see Earl Laser, Earl [name] on 04/24/2020 @ 11:30am.  Are transportation arrangements needed? No   If their condition worsens, is the pt aware to call PCP or go to the Emergency Dept.? Yes  Was the patient provided with contact information for the PCP's office or ED? Yes  Was to pt encouraged to call back with questions or concerns? Yes  Unfortunately [facility] is scheduling out until March. Patient was placed on cancellation list.

## 2020-01-24 ENCOUNTER — Telehealth (INDEPENDENT_AMBULATORY_CARE_PROVIDER_SITE_OTHER): Payer: Self-pay | Admitting: Internal Medicine

## 2020-01-24 ENCOUNTER — Telehealth: Payer: Self-pay

## 2020-01-24 MED ORDER — ONDANSETRON HCL 4 MG PO TABS: 4.0000 mg | ORAL_TABLET | Freq: Three times a day (TID) | ORAL | 0 refills | Status: DC | PRN

## 2020-01-24 NOTE — Telephone Encounter (Signed)
The answer is he should go get blood blood work done

## 2020-01-24 NOTE — Telephone Encounter (Signed)
  Prescription Request  01/24/2020  What is the name of the medication or equipment? ondansetron  Have you contacted your pharmacy to request a refill? (if applicable) no  Which pharmacy would you like this sent to? [facility]   Patient notified that their request is being sent to the clinical staff for review and that they should receive a response within 2 business days.

## 2020-01-24 NOTE — Telephone Encounter (Signed)
Patient left voice mail message stating he is pretty sure he is behind on all his lab work - please advise - ph# [phone]

## 2020-01-25 NOTE — Telephone Encounter (Signed)
Earl [name] will be made aware that the patient has had lab work done at [facility] on 01/17/2020. We will ask that he review those results and any recommendations that he may have we will contact the patient. Patient has appointment with Earl [name] in March 2022. Earl [name] was made aware.

## 2020-02-06 ENCOUNTER — Ambulatory Visit (INDEPENDENT_AMBULATORY_CARE_PROVIDER_SITE_OTHER): Payer: Medicaid Other

## 2020-02-06 DIAGNOSIS — R002 Palpitations: Secondary | ICD-10-CM

## 2020-02-06 LAB — CUP PACEART REMOTE DEVICE CHECK
Date Time Interrogation Session: 20220108233427
Implantable Pulse Generator Implant Date: 20201228

## 2020-02-07 DIAGNOSIS — Z7689 Persons encountering health services in other specified circumstances: Secondary | ICD-10-CM | POA: Diagnosis not present

## 2020-02-15 ENCOUNTER — Ambulatory Visit: Payer: Medicaid Other | Admitting: Urology

## 2020-02-15 DIAGNOSIS — N200 Calculus of kidney: Secondary | ICD-10-CM

## 2020-02-20 NOTE — Progress Notes (Signed)
Carelink Summary Report / Loop Recorder 

## 2020-02-23 ENCOUNTER — Ambulatory Visit (INDEPENDENT_AMBULATORY_CARE_PROVIDER_SITE_OTHER): Payer: Medicaid Other | Admitting: Nurse Practitioner

## 2020-02-23 VITALS — Temp 100.1°F

## 2020-02-23 DIAGNOSIS — J069 Acute upper respiratory infection, unspecified: Secondary | ICD-10-CM | POA: Insufficient documentation

## 2020-02-23 MED ORDER — AZITHROMYCIN 250 MG PO TABS: ORAL_TABLET | ORAL | 0 refills | Status: DC

## 2020-02-24 ENCOUNTER — Encounter: Payer: Self-pay | Admitting: Nurse Practitioner

## 2020-02-24 NOTE — Assessment & Plan Note (Signed)
Upper respiratory infection with cough and congestion not well managed.  Patient recently tested positive for Covid 19.  Started patient on Zithromax.  Increase hydration, monitor oxygen saturation and keep above 91%.  Follow-up with worsening unresolved symptoms.  Rx sent to pharmacy.  Follow-up with worsening symptoms.

## 2020-02-24 NOTE — Progress Notes (Signed)
   Virtual Visit via telephone Note Due to COVID-19 pandemic this visit was conducted virtually. This visit type was conducted due to national recommendations for restrictions regarding the COVID-19 Pandemic (e.g. social distancing, sheltering in place) in an effort to limit this patient's exposure and mitigate transmission in our community. All issues noted in this document were discussed and addressed.  A physical exam was not performed with this format.  I connected with Earl [name] on 02/24/20 at 4:55 PM by telephone and verified that I am speaking with the correct person using two identifiers. Earl [name] is currently located at home during visit. The provider, Ivy Lynn, NP is located in their office at time of visit.  I discussed the limitations, risks, security and privacy concerns of performing an evaluation and management service by telephone and the availability of in person appointments. I also discussed with the patient that there may be a patient responsible charge related to this service. The patient expressed understanding and agreed to proceed.   History and Present Illness:  Cough This is a new problem. The current episode started in the past 7 days. The problem occurs constantly. Associated symptoms include chills, ear congestion, a fever, headaches, nasal congestion and shortness of breath. Exacerbated by: Recent positive COVID-19. He has tried nothing for the symptoms.       Review of Systems  Constitutional: Positive for chills and fever.  Respiratory: Positive for cough and shortness of breath.   Neurological: Positive for headaches.     Observations/Objective: Telephone visit.  Patient sounds to be in distress, advised to go to emergency department for chest x-ray.  Patient reports he is unable to come to [facility] because of distance.  Assessment and Plan:   Upper respiratory infection with cough and congestion Upper  respiratory infection with cough and congestion not well managed.  Patient recently tested positive for Covid 19.  Started patient on Zithromax.  Increase hydration, monitor oxygen saturation and keep above 91%.  Follow-up with worsening unresolved symptoms.  Rx sent to pharmacy.  Follow-up with worsening symptoms.   Seek emergency care with decreasing oxygen saturation, cough and worsening symptoms.  Follow Up Instructions: Follow-up with worsening unresolved symptoms.   I discussed the assessment and treatment plan with the patient. The patient was provided an opportunity to ask questions and all were answered. The patient agreed with the plan and demonstrated an understanding of the instructions.   The patient was advised to call back or seek an in-person evaluation if the symptoms worsen or if the condition fails to improve as anticipated.  The above assessment and management plan was discussed with the patient. The patient verbalized understanding of and has agreed to the management plan. Patient is aware to call the clinic if symptoms persist or worsen. Patient is aware when to return to the clinic for a follow-up visit. Patient educated on when it is appropriate to go to the emergency department.   Time call ended:  5:16 PM  I provided 11 minutes of non-face-to-face time during this encounter.    Ivy Lynn, NP

## 2020-03-08 DIAGNOSIS — Z7689 Persons encountering health services in other specified circumstances: Secondary | ICD-10-CM | POA: Diagnosis not present

## 2020-03-10 LAB — CUP PACEART REMOTE DEVICE CHECK
Date Time Interrogation Session: 20220210234719
Implantable Pulse Generator Implant Date: 20201228

## 2020-03-12 ENCOUNTER — Ambulatory Visit (INDEPENDENT_AMBULATORY_CARE_PROVIDER_SITE_OTHER): Payer: Medicaid Other

## 2020-03-12 DIAGNOSIS — R002 Palpitations: Secondary | ICD-10-CM

## 2020-03-13 ENCOUNTER — Other Ambulatory Visit: Payer: Self-pay | Admitting: "Endocrinology

## 2020-03-13 DIAGNOSIS — E039 Hypothyroidism, unspecified: Secondary | ICD-10-CM

## 2020-03-15 DIAGNOSIS — Z7689 Persons encountering health services in other specified circumstances: Secondary | ICD-10-CM | POA: Diagnosis not present

## 2020-03-15 NOTE — Progress Notes (Signed)
Carelink Summary Report / Loop Recorder 

## 2020-03-19 ENCOUNTER — Other Ambulatory Visit: Payer: Self-pay | Admitting: Family

## 2020-03-19 DIAGNOSIS — R188 Other ascites: Secondary | ICD-10-CM

## 2020-03-26 DIAGNOSIS — M25519 Pain in unspecified shoulder: Secondary | ICD-10-CM | POA: Diagnosis not present

## 2020-03-26 DIAGNOSIS — M545 Low back pain, unspecified: Secondary | ICD-10-CM | POA: Diagnosis not present

## 2020-03-26 DIAGNOSIS — Z79891 Long term (current) use of opiate analgesic: Secondary | ICD-10-CM | POA: Diagnosis not present

## 2020-03-26 DIAGNOSIS — I10 Essential (primary) hypertension: Secondary | ICD-10-CM | POA: Diagnosis not present

## 2020-03-26 DIAGNOSIS — M542 Cervicalgia: Secondary | ICD-10-CM | POA: Diagnosis not present

## 2020-03-26 DIAGNOSIS — G609 Hereditary and idiopathic neuropathy, unspecified: Secondary | ICD-10-CM | POA: Diagnosis not present

## 2020-03-26 DIAGNOSIS — Z7689 Persons encountering health services in other specified circumstances: Secondary | ICD-10-CM | POA: Diagnosis not present

## 2020-03-26 DIAGNOSIS — F419 Anxiety disorder, unspecified: Secondary | ICD-10-CM | POA: Diagnosis not present

## 2020-03-26 DIAGNOSIS — F331 Major depressive disorder, recurrent, moderate: Secondary | ICD-10-CM | POA: Diagnosis not present

## 2020-03-26 DIAGNOSIS — E559 Vitamin D deficiency, unspecified: Secondary | ICD-10-CM | POA: Diagnosis not present

## 2020-03-26 DIAGNOSIS — M5412 Radiculopathy, cervical region: Secondary | ICD-10-CM | POA: Diagnosis not present

## 2020-04-05 DIAGNOSIS — Z7689 Persons encountering health services in other specified circumstances: Secondary | ICD-10-CM | POA: Diagnosis not present

## 2020-04-12 LAB — CUP PACEART REMOTE DEVICE CHECK
Date Time Interrogation Session: 20220316005224
Implantable Pulse Generator Implant Date: 20201228

## 2020-04-16 ENCOUNTER — Ambulatory Visit (INDEPENDENT_AMBULATORY_CARE_PROVIDER_SITE_OTHER): Payer: Medicaid Other

## 2020-04-16 DIAGNOSIS — R002 Palpitations: Secondary | ICD-10-CM | POA: Diagnosis not present

## 2020-04-19 DIAGNOSIS — Z7689 Persons encountering health services in other specified circumstances: Secondary | ICD-10-CM | POA: Diagnosis not present

## 2020-04-24 ENCOUNTER — Ambulatory Visit (INDEPENDENT_AMBULATORY_CARE_PROVIDER_SITE_OTHER): Payer: Medicaid Other | Admitting: Internal Medicine

## 2020-04-24 ENCOUNTER — Encounter (INDEPENDENT_AMBULATORY_CARE_PROVIDER_SITE_OTHER): Payer: Self-pay | Admitting: Internal Medicine

## 2020-04-24 ENCOUNTER — Other Ambulatory Visit: Payer: Self-pay

## 2020-04-24 VITALS — BP 118/71 | HR 78 | Temp 98.1°F | Ht 70.0 in | Wt 240.0 lb

## 2020-04-24 DIAGNOSIS — R188 Other ascites: Secondary | ICD-10-CM | POA: Diagnosis not present

## 2020-04-24 DIAGNOSIS — R7989 Other specified abnormal findings of blood chemistry: Secondary | ICD-10-CM

## 2020-04-24 DIAGNOSIS — Z7689 Persons encountering health services in other specified circumstances: Secondary | ICD-10-CM | POA: Diagnosis not present

## 2020-04-24 DIAGNOSIS — Z85038 Personal history of other malignant neoplasm of large intestine: Secondary | ICD-10-CM

## 2020-04-24 DIAGNOSIS — R945 Abnormal results of liver function studies: Secondary | ICD-10-CM

## 2020-04-24 MED ORDER — METAMUCIL SMOOTH TEXTURE 58.6 % PO POWD: 1.0000 | Freq: Three times a day (TID) | ORAL | 12 refills | Status: DC

## 2020-04-24 MED ORDER — ONDANSETRON HCL 4 MG PO TABS: 4.0000 mg | ORAL_TABLET | Freq: Two times a day (BID) | ORAL | 1 refills | Status: DC | PRN

## 2020-04-24 NOTE — Progress Notes (Signed)
Carelink Summary Report / Loop Recorder 

## 2020-04-24 NOTE — Patient Instructions (Signed)
Physician will call with results of blood tests Colonoscopy to be scheduled in June, 2022

## 2020-04-24 NOTE — Progress Notes (Signed)
Presenting complaint;  Follow-up for abnormal LFTs/cirrhosis ascites and he also has a history of invasive adenocarcinoma and a polyp.  Database and subjective:  Patient is 60 year old Caucasian male who has a history of elevated transaminases for which he was seen in July 2020 and again in March 2021.  Abnormal LFTs were felt to be due to fatty liver.  He used to drink but he had quit drinking several months earlier.  However he was admitted to [facility] in June last year with new onset of ascites and abnormal LFTs.  Patient states he began to drink alcohol in excessive amounts after he lost his father because of liver disease.  CT then suggested cirrhosis. He has been maintained on low-dose diuretic therapy. He also has a history of invasive adenocarcinoma and a pedunculated polyp which was removed in March 2018.  He opted not to have surgical intervention.  His CEA level has remained normal.  He had CT in April 2018 and again in March 2021 as well as in June 2021 and has been no evidence of adenopathy or liver lesions.  Patient states he is doing well.  He says he has lost 8 pounds in the last 1 month.  He states his weight at doctor's office was 250 pounds.  Today he is to 40 pounds.  He states he drinks alcohol for 3 to 4 days once a month.  He may have few drinks during this time.Marland Kitchen  He has not quit drinking alcohol altogether as we recommended.  He denies abdominal pain melena or rectal bleeding.  He has taking milk of magnesia on as-needed basis.  He is also requesting prescription for ondansetron just in case.  He has not required a dose in several weeks.  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  He states he has become lax about salt restriction in his diet.  He said he had Covid 2 months ago but did not require hospitalization. He complains of painful enlargement of his breasts.    Current Medications: Outpatient Encounter Medications as of  04/24/2020  Medication Sig  . ALPRAZolam (XANAX) 1 MG tablet Take 1 mg by mouth 2 (two) times daily as needed for anxiety.   Marland Kitchen diltiazem (CARDIZEM CD) 300 MG 24 hr capsule Take 1 capsule (300 mg total) by mouth daily.  . ergocalciferol (VITAMIN D2) 1.25 MG (50000 UT) capsule Vitamin D2 1,250 mcg (50,000 unit) capsule  TAKE 1 CAPSULE BY MOUTH EVERY WEEK  . furosemide (LASIX) 20 MG tablet Take 1 tablet (20 mg total) by mouth daily.  Marland Kitchen LYRICA 200 MG capsule Take 600 mg by mouth daily.  . ondansetron (ZOFRAN) 4 MG tablet Take 1 tablet (4 mg total) by mouth every 8 (eight) hours as needed for nausea or vomiting.  . pantoprazole (PROTONIX) 40 MG tablet pantoprazole 40 mg tablet,delayed release  TAKE 1 TABLET BY MOUTH ONCE DAILY BEFORE BREAKFAST  . sertraline (ZOLOFT) 50 MG tablet Take 50 mg by mouth daily.  Marland Kitchen spironolactone (ALDACTONE) 50 MG tablet TAKE 1 TABLET BY MOUTH EVERY DAY  . levothyroxine (SYNTHROID) 50 MCG tablet Take 50 mcg by mouth daily before breakfast. (Patient not taking: Reported on 04/24/2020)  . Magnesium Hydroxide (MAGNESIA PO) Take 1 tablet by mouth daily. (Patient not taking: Reported on 04/24/2020)  . zinc gluconate 50 MG tablet Take 50 mg by mouth every 30 (thirty) days. Take with vitamin d (Patient not taking: Reported on 04/24/2020)  . [DISCONTINUED] azithromycin (ZITHROMAX)  250 MG tablet 2 tablets day 1, 1 tablet day 2-5. (Patient not taking: Reported on 04/24/2020)   No facility-administered encounter medications on file as of 04/24/2020.    Objective: Blood pressure 118/71, pulse 78, temperature 98.1 F (36.7 C), temperature source Oral, height 5' 10"  (1.778 m), weight 240 lb 0.6 oz (108.9 kg). Patient is alert and in no acute distress. He is wearing a mask. He does not have asterixis. Conjunctiva is pink. Sclera is nonicteric Oropharyngeal mucosa is normal. No neck masses or thyromegaly noted. He has bilateral gynecomastia.  Right breast is more prominent in the left  and mildly tender. Cardiac exam with regular rhythm normal S1 and S2. No murmur or gallop noted. Lungs are clear to auscultation. Abdomen is full.  He has hepatomegaly.  Liver is firm and nontender.  Inferior margin is 3 to 4 cm in midclavicular line.  Left hepatic lobe is easily palpable midepigastric region.  Flanks are not full or bulging. No LE edema or clubbing noted.  Labs/studies Results:  CBC Latest Ref Rng & Units 01/17/2020 08/12/2019 07/30/2019  WBC 4.0 - 10.5 K/uL 12.4(H) 8.9 6.4  Hemoglobin 13.0 - 17.0 g/dL 16.0 15.8 13.9  Hematocrit 39.0 - 52.0 % 47.5 46.7 44.0  Platelets 150 - 400 K/uL 219 256 148(L)    CMP Latest Ref Rng & Units 01/17/2020 09/06/2019 08/18/2019  Glucose 70 - 99 mg/dL 93 88 108  BUN 6 - 20 mg/dL 38(H) 7 14  Creatinine 0.61 - 1.24 mg/dL 1.74(H) 1.12 1.47(H)  Sodium 135 - 145 mmol/L 133(L) 136 137  Potassium 3.5 - 5.1 mmol/L 4.0 4.6 4.6  Chloride 98 - 111 mmol/L 101 103 98  CO2 22 - 32 mmol/L 23 29 29   Calcium 8.9 - 10.3 mg/dL 8.8(L) 9.0 9.9  Total Protein 6.5 - 8.1 g/dL 7.9 7.4 8.5(H)  Total Bilirubin 0.3 - 1.2 mg/dL 1.4(H) 1.1 1.2  Alkaline Phos 38 - 126 U/L 77 - -  AST 15 - 41 U/L 48(H) 40(H) 78(H)  ALT 0 - 44 U/L 41 30 48(H)    Hepatic Function Latest Ref Rng & Units 01/17/2020 09/06/2019 08/18/2019  Total Protein 6.5 - 8.1 g/dL 7.9 7.4 8.5(H)  Albumin 3.5 - 5.0 g/dL 4.0 - -  AST 15 - 41 U/L 48(H) 40(H) 78(H)  ALT 0 - 44 U/L 41 30 48(H)  Alk Phosphatase 38 - 126 U/L 77 - -  Total Bilirubin 0.3 - 1.2 mg/dL 1.4(H) 1.1 1.2  Bilirubin, Direct 0.0 - 0.2 mg/dL - - -     Assessment:  #1.  Chronic liver disease.  He has history of elevated transaminases felt to be secondary to NASH but he presented with new onset of ascites and CT suggesting cirrhosis in June last year after he had gone back to drinking alcohol and excessive amount.  Clinically he does not have ascites.  May also consider EGD at some point.  #2.  Bilateral gynecomastia.  This could be due  to his chronic liver disease or spironolactone.  I am inclined to stop the spironolactone as clinically he does not have ascites.  We will defer this decision until lab studies drawn and completed.  #3.  History of invasive adenocarcinoma and a pedunculated polyp.  Last colonoscopy was in April 2019.  He is due for repeat exam.  Plan:  Patient will go to the lab for metabolic 7, LFTs and CEA level. Patient advised that he must not drink alcohol at all in order to improve  his prognosis. We will schedule surveillance colonoscopy in June 2022 along with esophagogastroduodenoscopy. New prescription for ondansetron 4 mg p.o. twice daily as needed given. Office visit in 6 months.

## 2020-04-25 LAB — CEA: CEA: 0.5 ng/mL

## 2020-04-25 LAB — BASIC METABOLIC PANEL
BUN: 13 mg/dL (ref 7–25)
CO2: 27 mmol/L (ref 20–32)
Calcium: 9.2 mg/dL (ref 8.6–10.3)
Chloride: 106 mmol/L (ref 98–110)
Creat: 1.03 mg/dL (ref 0.70–1.25)
Glucose, Bld: 95 mg/dL (ref 65–139)
Potassium: 4.2 mmol/L (ref 3.5–5.3)
Sodium: 143 mmol/L (ref 135–146)

## 2020-04-25 LAB — HEPATIC FUNCTION PANEL
AG Ratio: 1.3 (calc) (ref 1.0–2.5)
ALT: 21 U/L (ref 9–46)
AST: 25 U/L (ref 10–35)
Albumin: 4.2 g/dL (ref 3.6–5.1)
Alkaline phosphatase (APISO): 78 U/L (ref 35–144)
Bilirubin, Direct: 0.2 mg/dL (ref 0.0–0.2)
Globulin: 3.2 g/dL (calc) (ref 1.9–3.7)
Indirect Bilirubin: 0.5 mg/dL (calc) (ref 0.2–1.2)
Total Bilirubin: 0.7 mg/dL (ref 0.2–1.2)
Total Protein: 7.4 g/dL (ref 6.1–8.1)

## 2020-04-26 ENCOUNTER — Other Ambulatory Visit: Payer: Self-pay | Admitting: Family

## 2020-04-26 DIAGNOSIS — Z7689 Persons encountering health services in other specified circumstances: Secondary | ICD-10-CM | POA: Diagnosis not present

## 2020-04-26 DIAGNOSIS — R188 Other ascites: Secondary | ICD-10-CM

## 2020-05-10 DIAGNOSIS — Z7689 Persons encountering health services in other specified circumstances: Secondary | ICD-10-CM | POA: Diagnosis not present

## 2020-05-14 ENCOUNTER — Ambulatory Visit (INDEPENDENT_AMBULATORY_CARE_PROVIDER_SITE_OTHER): Payer: Medicaid Other

## 2020-05-14 DIAGNOSIS — R002 Palpitations: Secondary | ICD-10-CM | POA: Diagnosis not present

## 2020-05-16 LAB — CUP PACEART REMOTE DEVICE CHECK
Date Time Interrogation Session: 20220418010922
Implantable Pulse Generator Implant Date: 20201228

## 2020-05-19 ENCOUNTER — Other Ambulatory Visit (INDEPENDENT_AMBULATORY_CARE_PROVIDER_SITE_OTHER): Payer: Self-pay | Admitting: Internal Medicine

## 2020-05-21 DIAGNOSIS — F419 Anxiety disorder, unspecified: Secondary | ICD-10-CM | POA: Diagnosis not present

## 2020-05-21 DIAGNOSIS — Z7689 Persons encountering health services in other specified circumstances: Secondary | ICD-10-CM | POA: Diagnosis not present

## 2020-05-21 DIAGNOSIS — G609 Hereditary and idiopathic neuropathy, unspecified: Secondary | ICD-10-CM | POA: Diagnosis not present

## 2020-05-21 DIAGNOSIS — M5412 Radiculopathy, cervical region: Secondary | ICD-10-CM | POA: Diagnosis not present

## 2020-05-21 DIAGNOSIS — M542 Cervicalgia: Secondary | ICD-10-CM | POA: Diagnosis not present

## 2020-05-21 DIAGNOSIS — M25519 Pain in unspecified shoulder: Secondary | ICD-10-CM | POA: Diagnosis not present

## 2020-05-21 DIAGNOSIS — I10 Essential (primary) hypertension: Secondary | ICD-10-CM | POA: Diagnosis not present

## 2020-05-21 DIAGNOSIS — F331 Major depressive disorder, recurrent, moderate: Secondary | ICD-10-CM | POA: Diagnosis not present

## 2020-05-21 DIAGNOSIS — E559 Vitamin D deficiency, unspecified: Secondary | ICD-10-CM | POA: Diagnosis not present

## 2020-05-21 DIAGNOSIS — M545 Low back pain, unspecified: Secondary | ICD-10-CM | POA: Diagnosis not present

## 2020-05-21 DIAGNOSIS — Z79891 Long term (current) use of opiate analgesic: Secondary | ICD-10-CM | POA: Diagnosis not present

## 2020-05-24 DIAGNOSIS — Z7689 Persons encountering health services in other specified circumstances: Secondary | ICD-10-CM | POA: Diagnosis not present

## 2020-05-28 ENCOUNTER — Other Ambulatory Visit (INDEPENDENT_AMBULATORY_CARE_PROVIDER_SITE_OTHER): Payer: Self-pay | Admitting: Internal Medicine

## 2020-05-30 ENCOUNTER — Other Ambulatory Visit: Payer: Self-pay | Admitting: "Endocrinology

## 2020-05-30 DIAGNOSIS — E039 Hypothyroidism, unspecified: Secondary | ICD-10-CM

## 2020-05-30 NOTE — Progress Notes (Signed)
Carelink Summary Report / Loop Recorder 

## 2020-05-31 DIAGNOSIS — Z7689 Persons encountering health services in other specified circumstances: Secondary | ICD-10-CM | POA: Diagnosis not present

## 2020-06-01 ENCOUNTER — Encounter: Payer: Medicaid Other | Admitting: Internal Medicine

## 2020-06-14 ENCOUNTER — Encounter (INDEPENDENT_AMBULATORY_CARE_PROVIDER_SITE_OTHER): Payer: Self-pay | Admitting: *Deleted

## 2020-06-14 DIAGNOSIS — Z7689 Persons encountering health services in other specified circumstances: Secondary | ICD-10-CM | POA: Diagnosis not present

## 2020-06-18 ENCOUNTER — Ambulatory Visit (INDEPENDENT_AMBULATORY_CARE_PROVIDER_SITE_OTHER): Payer: Medicaid Other

## 2020-06-18 DIAGNOSIS — I471 Supraventricular tachycardia: Secondary | ICD-10-CM

## 2020-06-19 ENCOUNTER — Other Ambulatory Visit: Payer: Self-pay

## 2020-06-19 ENCOUNTER — Encounter: Payer: Self-pay | Admitting: Family

## 2020-06-19 ENCOUNTER — Ambulatory Visit: Payer: Medicaid Other | Admitting: Family

## 2020-06-19 VITALS — BP 132/77 | HR 63 | Temp 97.9°F | Ht 69.0 in | Wt 242.0 lb

## 2020-06-19 DIAGNOSIS — K7030 Alcoholic cirrhosis of liver without ascites: Secondary | ICD-10-CM | POA: Insufficient documentation

## 2020-06-19 DIAGNOSIS — I484 Atypical atrial flutter: Secondary | ICD-10-CM | POA: Diagnosis not present

## 2020-06-19 DIAGNOSIS — C187 Malignant neoplasm of sigmoid colon: Secondary | ICD-10-CM

## 2020-06-19 DIAGNOSIS — R5383 Other fatigue: Secondary | ICD-10-CM

## 2020-06-19 DIAGNOSIS — E559 Vitamin D deficiency, unspecified: Secondary | ICD-10-CM | POA: Diagnosis not present

## 2020-06-19 DIAGNOSIS — E538 Deficiency of other specified B group vitamins: Secondary | ICD-10-CM | POA: Diagnosis not present

## 2020-06-19 DIAGNOSIS — Z5329 Procedure and treatment not carried out because of patient's decision for other reasons: Secondary | ICD-10-CM

## 2020-06-19 DIAGNOSIS — Z91199 Patient's noncompliance with other medical treatment and regimen due to unspecified reason: Secondary | ICD-10-CM

## 2020-06-19 DIAGNOSIS — G629 Polyneuropathy, unspecified: Secondary | ICD-10-CM | POA: Diagnosis not present

## 2020-06-19 DIAGNOSIS — Z7689 Persons encountering health services in other specified circumstances: Secondary | ICD-10-CM | POA: Diagnosis not present

## 2020-06-19 DIAGNOSIS — E038 Other specified hypothyroidism: Secondary | ICD-10-CM

## 2020-06-19 DIAGNOSIS — F339 Major depressive disorder, recurrent, unspecified: Secondary | ICD-10-CM | POA: Diagnosis not present

## 2020-06-19 DIAGNOSIS — I10 Essential (primary) hypertension: Secondary | ICD-10-CM

## 2020-06-19 DIAGNOSIS — E782 Mixed hyperlipidemia: Secondary | ICD-10-CM | POA: Diagnosis not present

## 2020-06-19 DIAGNOSIS — H6123 Impacted cerumen, bilateral: Secondary | ICD-10-CM

## 2020-06-19 DIAGNOSIS — F1020 Alcohol dependence, uncomplicated: Secondary | ICD-10-CM

## 2020-06-19 LAB — CUP PACEART REMOTE DEVICE CHECK
Date Time Interrogation Session: 20220521010744
Implantable Pulse Generator Implant Date: 20201228

## 2020-06-19 NOTE — Progress Notes (Signed)
Subjective:    Patient ID: Earl [name], male    DOB: 1960-06-09, 60 y.o.   MRN: [id number]  Chief Complaint  Patient presents with  . Medical Management of Chronic Issues    Wants b12 level checked today   . Hypothyroidism  . Hypertension   Pt calls the office today for chronic follow up. He is followed by GI for alcoholic cirrhosis and malignant neoplasm of colon. He is followed by Pain Clinic once a month for chronic pain and neuropathy. He is followed by Cardiologists for NSVT and atrial flutter.   He states he is drinking 24 oz a beer a day.  Hypertension This is a chronic problem. The current episode started more than 1 year ago. The problem has been resolved since onset. The problem is controlled. Associated symptoms include malaise/fatigue. Pertinent negatives include no peripheral edema or shortness of breath. Risk factors for coronary artery disease include dyslipidemia, obesity and male gender. The current treatment provides moderate improvement. There is no history of kidney disease. Identifiable causes of hypertension include a thyroid problem.  Thyroid Problem Presents for follow-up visit. Symptoms include constipation, depressed mood and fatigue. The symptoms have been stable. His past medical history is significant for hyperlipidemia.  Depression        This is a chronic problem.  The current episode started more than 1 year ago.   The onset quality is gradual.   The problem occurs intermittently.  Associated symptoms include fatigue, irritable, restlessness and decreased interest.  Associated symptoms include no helplessness, no hopelessness and not sad.  Past treatments include SSRIs - Selective serotonin reuptake inhibitors.  Past medical history includes thyroid problem.   Hyperlipidemia Pertinent negatives include no shortness of breath.      Review of Systems  Constitutional: Positive for fatigue and malaise/fatigue.  Respiratory: Negative for shortness of  breath.   Gastrointestinal: Positive for constipation.  Psychiatric/Behavioral: Positive for depression.  All other systems reviewed and are negative.      Objective:   Physical Exam Vitals reviewed.  Constitutional:      General: He is irritable. He is not in acute distress.    Appearance: He is well-developed.  HENT:     Head: Normocephalic.     Right Ear: There is impacted cerumen.     Left Ear: There is impacted cerumen.  Eyes:     General:        Right eye: No discharge.        Left eye: No discharge.     Pupils: Pupils are equal, round, and reactive to light.  Neck:     Thyroid: No thyromegaly.  Cardiovascular:     Rate and Rhythm: Normal rate and regular rhythm.     Heart sounds: Normal heart sounds. No murmur heard.   Pulmonary:     Effort: Pulmonary effort is normal. No respiratory distress.     Breath sounds: Normal breath sounds. No wheezing.  Abdominal:     General: Bowel sounds are normal. There is no distension.     Palpations: Abdomen is soft.     Tenderness: There is no abdominal tenderness.  Musculoskeletal:        General: No tenderness. Normal range of motion.     Cervical back: Normal range of motion and neck supple.  Skin:    General: Skin is warm and dry.     Findings: No erythema or rash.  Neurological:     Mental Status: He is alert  and oriented to person, place, and time.     Cranial Nerves: No cranial nerve deficit.     Deep Tendon Reflexes: Reflexes are normal and symmetric.  Psychiatric:        Behavior: Behavior normal.        Thought Content: Thought content normal.        Judgment: Judgment normal.     Bilateral ears washed with warm water and peroxide, ear curette used for hard ball. - TM WNL, PT tolerated well.   BP 132/77   Pulse 63   Temp 97.9 F (36.6 C) (Temporal)   Ht 5' 9"  (1.753 m)   Wt 242 lb (109.8 kg)   BMI 35.74 kg/m      Assessment & Plan:  GOKUL WAYBRIGHT comes in today with chief complaint of Medical  Management of Chronic Issues (Wants b12 level checked today ), Hypothyroidism, and Hypertension   Diagnosis and orders addressed:  1. Essential hypertension - CMP14+EGFR  2. Atypical atrial flutter (HCC - CMP14+EGFR  3. Malignant neoplasm of sigmoid colon (HCC) - CMP14+EGFR  4. Subclinical hypothyroidism - CMP14+EGFR - TSH  5. Neuropathy - CMP14+EGFR - Testosterone,Free and Total; Future  6. Vitamin B 12 deficiency - Anemia Profile B - CMP14+EGFR  7. Vitamin D deficiency - Anemia Profile B - CMP14+EGFR  8. Noncompliance by refusing service - CMP14+EGFR  9. Mixed hyperlipidemia - CMP14+EGFR  10. Morbid obesity ([place]) - CMP14+EGFR  11. ETOHism ([place]) - CMP14+EGFR  12. Depression, recurrent ([place]) - CMP14+EGFR - Testosterone,Free and Total; Future  13. Alcoholic cirrhosis of liver without ascites (HCC) - CMP14+EGFR  14. Bilateral impacted cerumen - CMP14+EGFR  15. Fatigue, unspecified type - Testosterone,Free and Total; Future   Labs pending Health Maintenance reviewed Diet and exercise encouraged  Follow up plan: 3 months    Evelina Dun, FNP

## 2020-06-19 NOTE — Patient Instructions (Signed)

## 2020-06-20 DIAGNOSIS — M545 Low back pain, unspecified: Secondary | ICD-10-CM | POA: Diagnosis not present

## 2020-06-20 DIAGNOSIS — I10 Essential (primary) hypertension: Secondary | ICD-10-CM | POA: Diagnosis not present

## 2020-06-20 DIAGNOSIS — Z79891 Long term (current) use of opiate analgesic: Secondary | ICD-10-CM | POA: Diagnosis not present

## 2020-06-20 DIAGNOSIS — Z7689 Persons encountering health services in other specified circumstances: Secondary | ICD-10-CM | POA: Diagnosis not present

## 2020-06-20 DIAGNOSIS — F419 Anxiety disorder, unspecified: Secondary | ICD-10-CM | POA: Diagnosis not present

## 2020-06-20 DIAGNOSIS — G609 Hereditary and idiopathic neuropathy, unspecified: Secondary | ICD-10-CM | POA: Diagnosis not present

## 2020-06-20 DIAGNOSIS — F331 Major depressive disorder, recurrent, moderate: Secondary | ICD-10-CM | POA: Diagnosis not present

## 2020-06-20 DIAGNOSIS — M5412 Radiculopathy, cervical region: Secondary | ICD-10-CM | POA: Diagnosis not present

## 2020-06-20 DIAGNOSIS — M25519 Pain in unspecified shoulder: Secondary | ICD-10-CM | POA: Diagnosis not present

## 2020-06-20 DIAGNOSIS — E559 Vitamin D deficiency, unspecified: Secondary | ICD-10-CM | POA: Diagnosis not present

## 2020-06-20 DIAGNOSIS — M542 Cervicalgia: Secondary | ICD-10-CM | POA: Diagnosis not present

## 2020-06-20 LAB — ANEMIA PROFILE B
Basophils Absolute: 0 10*3/uL (ref 0.0–0.2)
Basos: 1 %
EOS (ABSOLUTE): 0.2 10*3/uL (ref 0.0–0.4)
Eos: 3 %
Ferritin: 90 ng/mL (ref 30–400)
Folate: 7 ng/mL (ref >3.0)
Hematocrit: 44.6 % (ref 37.5–51.0)
Hemoglobin: 15.6 g/dL (ref 13.0–17.7)
Immature Grans (Abs): 0 10*3/uL (ref 0.0–0.1)
Immature Granulocytes: 0 %
Iron Saturation: 61 % — ABNORMAL HIGH (ref 15–55)
Iron: 183 ug/dL — ABNORMAL HIGH (ref 38–169)
Lymphocytes Absolute: 1.9 10*3/uL (ref 0.7–3.1)
Lymphs: 30 %
MCH: 31.9 pg (ref 26.6–33.0)
MCHC: 35 g/dL (ref 31.5–35.7)
MCV: 91 fL (ref 79–97)
Monocytes Absolute: 0.6 10*3/uL (ref 0.1–0.9)
Monocytes: 10 %
Neutrophils Absolute: 3.6 10*3/uL (ref 1.4–7.0)
Neutrophils: 56 %
Platelets: 136 10*3/uL — ABNORMAL LOW (ref 150–450)
RBC: 4.89 x10E6/uL (ref 4.14–5.80)
RDW: 13 % (ref 11.6–15.4)
Retic Ct Pct: 1.8 % (ref 0.6–2.6)
Total Iron Binding Capacity: 298 ug/dL (ref 250–450)
UIBC: 115 ug/dL (ref 111–343)
Vitamin B-12: 266 pg/mL (ref 232–1245)
WBC: 6.3 10*3/uL (ref 3.4–10.8)

## 2020-06-20 LAB — CMP14+EGFR
ALT: 24 IU/L (ref 0–44)
AST: 37 IU/L (ref 0–40)
Albumin/Globulin Ratio: 1.4 (ref 1.2–2.2)
Albumin: 4.2 g/dL (ref 3.8–4.8)
Alkaline Phosphatase: 99 IU/L (ref 44–121)
BUN/Creatinine Ratio: 11 (ref 10–24)
BUN: 10 mg/dL (ref 8–27)
Bilirubin Total: 1.4 mg/dL — ABNORMAL HIGH (ref 0.0–1.2)
CO2: 25 mmol/L (ref 20–29)
Calcium: 9.1 mg/dL (ref 8.6–10.2)
Chloride: 102 mmol/L (ref 96–106)
Creatinine, Ser: 0.88 mg/dL (ref 0.76–1.27)
Globulin, Total: 3 g/dL (ref 1.5–4.5)
Glucose: 105 mg/dL — ABNORMAL HIGH (ref 65–99)
Potassium: 3.9 mmol/L (ref 3.5–5.2)
Sodium: 141 mmol/L (ref 134–144)
Total Protein: 7.2 g/dL (ref 6.0–8.5)
eGFR: 98 mL/min/{1.73_m2} (ref >59)

## 2020-06-20 LAB — TSH: TSH: 2.98 u[IU]/mL (ref 0.450–4.500)

## 2020-06-27 ENCOUNTER — Telehealth (INDEPENDENT_AMBULATORY_CARE_PROVIDER_SITE_OTHER): Payer: Self-pay

## 2020-06-27 ENCOUNTER — Encounter (INDEPENDENT_AMBULATORY_CARE_PROVIDER_SITE_OTHER): Payer: Self-pay

## 2020-06-27 ENCOUNTER — Telehealth: Payer: Self-pay | Admitting: Family

## 2020-06-27 ENCOUNTER — Other Ambulatory Visit (INDEPENDENT_AMBULATORY_CARE_PROVIDER_SITE_OTHER): Payer: Self-pay

## 2020-06-27 DIAGNOSIS — E782 Mixed hyperlipidemia: Secondary | ICD-10-CM

## 2020-06-27 DIAGNOSIS — I471 Supraventricular tachycardia: Secondary | ICD-10-CM

## 2020-06-27 DIAGNOSIS — Z85038 Personal history of other malignant neoplasm of large intestine: Secondary | ICD-10-CM

## 2020-06-27 DIAGNOSIS — E538 Deficiency of other specified B group vitamins: Secondary | ICD-10-CM

## 2020-06-27 DIAGNOSIS — K7030 Alcoholic cirrhosis of liver without ascites: Secondary | ICD-10-CM

## 2020-06-27 MED ORDER — CYANOCOBALAMIN 1000 MCG/ML IJ SOLN: 1000.0000 ug | INTRAMUSCULAR | 6 refills | Status: DC

## 2020-06-27 MED ORDER — PEG 3350-KCL-NA BICARB-NACL 420 G PO SOLR: 4000.0000 mL | ORAL | 0 refills | Status: DC

## 2020-06-27 NOTE — Telephone Encounter (Signed)
Earl [name], CMA  

## 2020-06-27 NOTE — Telephone Encounter (Signed)
Patient aware and verbalizes understanding. 

## 2020-06-27 NOTE — Progress Notes (Signed)
CBC

## 2020-06-27 NOTE — Telephone Encounter (Signed)
Vit B 12 Prescription sent to pharmacy

## 2020-07-10 DIAGNOSIS — Z7689 Persons encountering health services in other specified circumstances: Secondary | ICD-10-CM | POA: Diagnosis not present

## 2020-07-10 NOTE — Progress Notes (Signed)
Carelink Summary Report / Loop Recorder 

## 2020-07-17 NOTE — Patient Instructions (Signed)
Earl [name]  07/17/2020     @PREFPERIOPPHARMACY @   Your procedure is scheduled on  07/25/2020.   Report to [name] at  [facility]  Call this number if you have problems the morning of surgery:  [phone]   Remember:  Follow the diet and prep instructions given to you by the office.    Take these medicines the morning of surgery with A SIP OF WATER      xanax (if needed), diltiazem, levothyroxine, lyrica, zofran (if needed),Oxycodone (if needed), protoniox, zoloft.     Please brush your teeth.  Do not wear jewelry, make-up or nail polish.  Do not wear lotions, powders, or perfumes, or deodorant.  Do not shave 48 hours prior to surgery.  Men may shave face and neck.  Do not bring valuables to the hospital.  [facility] is not responsible for any belongings or valuables.  Contacts, dentures or bridgework may not be worn into surgery.  Leave your suitcase in the car.  After surgery it may be brought to your room.  For patients admitted to the hospital, discharge time will be determined by your treatment team.  Patients discharged the day of surgery will not be allowed to drive home and must have someone with them for 24 hours.    Special instructions:   DO NOT smoke tobacco or vape for 24 hours before your procedure.  Please read over the following fact sheets that you were given. Anesthesia Post-op Instructions and Care and Recovery After Surgery      Upper Endoscopy, Adult, Care After This sheet gives you information about how to care for yourself after your procedure. Your health care provider may also give you more specific instructions. If you have problems or questions, contact your health careprovider. What can I expect after the procedure? After the procedure, it is common to have: A sore throat. Mild stomach pain or discomfort. Bloating. Nausea. Follow these instructions at home:  Follow instructions from your health care provider about  what to eat or drink after your procedure. Return to your normal activities as told by your health care provider. Ask your health care provider what activities are safe for you. Take over-the-counter and prescription medicines only as told by your health care provider. If you were given a sedative during the procedure, it can affect you for several hours. Do not drive or operate machinery until your health care provider says that it is safe. Keep all follow-up visits as told by your health care provider. This is important. Contact a health care provider if you have: A sore throat that lasts longer than one day. Trouble swallowing. Get help right away if: You vomit blood or your vomit looks like coffee grounds. You have: A fever. Bloody, black, or tarry stools. A severe sore throat or you cannot swallow. Difficulty breathing. Severe pain in your chest or abdomen. Summary After the procedure, it is common to have a sore throat, mild stomach discomfort, bloating, and nausea. If you were given a sedative during the procedure, it can affect you for several hours. Do not drive or operate machinery until your health care provider says that it is safe. Follow instructions from your health care provider about what to eat or drink after your procedure. Return to your normal activities as told by your health care provider. This information is not intended to replace advice given to you by your health care provider. Make  sure you discuss any questions you have with your healthcare provider. Document Revised: 01/11/2019 Document Reviewed: 06/15/2017 Elsevier Patient Education  2022 [place]. Colonoscopy, Adult, Care After This sheet gives you information about how to care for yourself after your procedure. Your health care provider may also give you more specific instructions. If you have problems or questions, contact your health careprovider. What can I expect after the procedure? After the  procedure, it is common to have: A small amount of blood in your stool for 24 hours after the procedure. Some gas. Mild cramping or bloating of your abdomen. Follow these instructions at home: Eating and drinking  Drink enough fluid to keep your urine pale yellow. Follow instructions from your health care provider about eating or drinking restrictions. Resume your normal diet as instructed by your health care provider. Avoid heavy or fried foods that are hard to digest.  Activity Rest as told by your health care provider. Avoid sitting for a long time without moving. Get up to take short walks every 1-2 hours. This is important to improve blood flow and breathing. Ask for help if you feel weak or unsteady. Return to your normal activities as told by your health care provider. Ask your health care provider what activities are safe for you. Managing cramping and bloating  Try walking around when you have cramps or feel bloated. Apply heat to your abdomen as told by your health care provider. Use the heat source that your health care provider recommends, such as a moist heat pack or a heating pad. Place a towel between your skin and the heat source. Leave the heat on for 20-30 minutes. Remove the heat if your skin turns bright red. This is especially important if you are unable to feel pain, heat, or cold. You may have a greater risk of getting burned.  General instructions If you were given a sedative during the procedure, it can affect you for several hours. Do not drive or operate machinery until your health care provider says that it is safe. For the first 24 hours after the procedure: Do not sign important documents. Do not drink alcohol. Do your regular daily activities at a slower pace than normal. Eat soft foods that are easy to digest. Take over-the-counter and prescription medicines only as told by your health care provider. Keep all follow-up visits as told by your health care  provider. This is important. Contact a health care provider if: You have blood in your stool 2-3 days after the procedure. Get help right away if you have: More than a small spotting of blood in your stool. Large blood clots in your stool. Swelling of your abdomen. Nausea or vomiting. A fever. Increasing pain in your abdomen that is not relieved with medicine. Summary After the procedure, it is common to have a small amount of blood in your stool. You may also have mild cramping and bloating of your abdomen. If you were given a sedative during the procedure, it can affect you for several hours. Do not drive or operate machinery until your health care provider says that it is safe. Get help right away if you have a lot of blood in your stool, nausea or vomiting, a fever, or increased pain in your abdomen. This information is not intended to replace advice given to you by your health care provider. Make sure you discuss any questions you have with your healthcare provider. Document Revised: 01/07/2019 Document Reviewed: 08/09/2018 Elsevier Patient Education  2022  [place] After This sheet gives you information about how to care for yourself after your procedure. Your health care provider may also give you more specific instructions. If you have problems or questions, contact your health careprovider. What can I expect after the procedure? After the procedure, it is common to have: Tiredness. Forgetfulness about what happened after the procedure. Impaired judgment for important decisions. Nausea or vomiting. Some difficulty with balance. Follow these instructions at home: For the time period you were told by your health care provider:     Rest as needed. Do not participate in activities where you could fall or become injured. Do not drive or use machinery. Do not drink alcohol. Do not take sleeping pills or medicines that cause drowsiness. Do not  make important decisions or sign legal documents. Do not take care of children on your own. Eating and drinking Follow the diet that is recommended by your health care provider. Drink enough fluid to keep your urine pale yellow. If you vomit: Drink water, juice, or soup when you can drink without vomiting. Make sure you have little or no nausea before eating solid foods. General instructions Have a responsible adult stay with you for the time you are told. It is important to have someone help care for you until you are awake and alert. Take over-the-counter and prescription medicines only as told by your health care provider. If you have sleep apnea, surgery and certain medicines can increase your risk for breathing problems. Follow instructions from your health care provider about wearing your sleep device: Anytime you are sleeping, including during daytime naps. While taking prescription pain medicines, sleeping medicines, or medicines that make you drowsy. Avoid smoking. Keep all follow-up visits as told by your health care provider. This is important. Contact a health care provider if: You keep feeling nauseous or you keep vomiting. You feel light-headed. You are still sleepy or having trouble with balance after 24 hours. You develop a rash. You have a fever. You have redness or swelling around the IV site. Get help right away if: You have trouble breathing. You have new-onset confusion at home. Summary For several hours after your procedure, you may feel tired. You may also be forgetful and have poor judgment. Have a responsible adult stay with you for the time you are told. It is important to have someone help care for you until you are awake and alert. Rest as told. Do not drive or operate machinery. Do not drink alcohol or take sleeping pills. Get help right away if you have trouble breathing, or if you suddenly become confused. This information is not intended to replace  advice given to you by your health care provider. Make sure you discuss any questions you have with your healthcare provider. Document Revised: 09/29/2019 Document Reviewed: 12/16/2018 Elsevier Patient Education  2022 [employer].

## 2020-07-18 ENCOUNTER — Encounter (HOSPITAL_COMMUNITY): Payer: Medicaid Other

## 2020-07-18 DIAGNOSIS — K7030 Alcoholic cirrhosis of liver without ascites: Secondary | ICD-10-CM | POA: Diagnosis not present

## 2020-07-18 DIAGNOSIS — E538 Deficiency of other specified B group vitamins: Secondary | ICD-10-CM | POA: Diagnosis not present

## 2020-07-18 DIAGNOSIS — E782 Mixed hyperlipidemia: Secondary | ICD-10-CM | POA: Diagnosis not present

## 2020-07-18 DIAGNOSIS — D509 Iron deficiency anemia, unspecified: Secondary | ICD-10-CM | POA: Diagnosis not present

## 2020-07-18 DIAGNOSIS — Z7689 Persons encountering health services in other specified circumstances: Secondary | ICD-10-CM | POA: Diagnosis not present

## 2020-07-18 DIAGNOSIS — I471 Supraventricular tachycardia: Secondary | ICD-10-CM | POA: Diagnosis not present

## 2020-07-19 ENCOUNTER — Telehealth: Payer: Self-pay | Admitting: Family

## 2020-07-19 LAB — CUP PACEART REMOTE DEVICE CHECK
Date Time Interrogation Session: 20220623011412
Implantable Pulse Generator Implant Date: 20201228

## 2020-07-19 MED ORDER — CYANOCOBALAMIN 1000 MCG/ML IJ SOLN: 1000.0000 ug | INTRAMUSCULAR | 6 refills | Status: DC

## 2020-07-19 NOTE — Telephone Encounter (Signed)
B12 resent to the correct pharmacy and pt is aware.

## 2020-07-20 ENCOUNTER — Other Ambulatory Visit (INDEPENDENT_AMBULATORY_CARE_PROVIDER_SITE_OTHER): Payer: Self-pay

## 2020-07-20 ENCOUNTER — Encounter (HOSPITAL_COMMUNITY)
Admission: RE | Admit: 2020-07-20 | Discharge: 2020-07-20 | Disposition: A | Discharge status: Home / Self Care | Payer: Medicaid Other | Source: Ambulatory Visit | Attending: Internal Medicine | Admitting: Internal Medicine

## 2020-07-20 ENCOUNTER — Encounter (HOSPITAL_COMMUNITY): Payer: Self-pay

## 2020-07-20 ENCOUNTER — Other Ambulatory Visit: Payer: Self-pay

## 2020-07-20 DIAGNOSIS — K7030 Alcoholic cirrhosis of liver without ascites: Secondary | ICD-10-CM

## 2020-07-20 DIAGNOSIS — Z7689 Persons encountering health services in other specified circumstances: Secondary | ICD-10-CM | POA: Diagnosis not present

## 2020-07-20 DIAGNOSIS — Z01818 Encounter for other preprocedural examination: Secondary | ICD-10-CM | POA: Diagnosis not present

## 2020-07-20 HISTORY — DX: Cardiac arrhythmia, unspecified: I49.9

## 2020-07-20 NOTE — Progress Notes (Signed)
Pt informed that he must have a ride home & someone to assume his care once discharged from hospital.  If he doesn't that he will have to cancel & reschedule.

## 2020-07-20 NOTE — Progress Notes (Signed)
b

## 2020-07-21 LAB — CBC WITH DIFFERENTIAL/PLATELET
Absolute Monocytes: 402 cells/uL (ref 200–950)
Basophils Absolute: 57 cells/uL (ref 0–200)
Basophils Relative: 0.7 %
Eosinophils Absolute: 230 cells/uL (ref 15–500)
Eosinophils Relative: 2.8 %
HCT: 45.3 % (ref 38.5–50.0)
Hemoglobin: 15.9 g/dL (ref 13.2–17.1)
Lymphs Abs: 2952 cells/uL (ref 850–3900)
MCH: 32.2 pg (ref 27.0–33.0)
MCHC: 35.1 g/dL (ref 32.0–36.0)
MCV: 91.7 fL (ref 80.0–100.0)
MPV: 12.1 fL (ref 7.5–12.5)
Monocytes Relative: 4.9 %
Neutro Abs: 4559 cells/uL (ref 1500–7800)
Neutrophils Relative %: 55.6 %
Platelets: 171 10*3/uL (ref 140–400)
RBC: 4.94 10*6/uL (ref 4.20–5.80)
RDW: 12.7 % (ref 11.0–15.0)
Total Lymphocyte: 36 %
WBC: 8.2 10*3/uL (ref 3.8–10.8)

## 2020-07-21 LAB — TEST AUTHORIZATION

## 2020-07-21 LAB — COMPREHENSIVE METABOLIC PANEL
AG Ratio: 1.1 (calc) (ref 1.0–2.5)
ALT: 22 U/L (ref 9–46)
AST: 31 U/L (ref 10–35)
Albumin: 4 g/dL (ref 3.6–5.1)
Alkaline phosphatase (APISO): 69 U/L (ref 35–144)
BUN: 8 mg/dL (ref 7–25)
CO2: 22 mmol/L (ref 20–32)
Calcium: 8.9 mg/dL (ref 8.6–10.3)
Chloride: 107 mmol/L (ref 98–110)
Creat: 0.83 mg/dL (ref 0.70–1.25)
Globulin: 3.5 g/dL (calc) (ref 1.9–3.7)
Glucose, Bld: 93 mg/dL (ref 65–139)
Potassium: 3.8 mmol/L (ref 3.5–5.3)
Sodium: 141 mmol/L (ref 135–146)
Total Bilirubin: 0.7 mg/dL (ref 0.2–1.2)
Total Protein: 7.5 g/dL (ref 6.1–8.1)

## 2020-07-21 LAB — PROTIME-INR
INR: 1.1
Prothrombin Time: 10.9 s (ref 9.0–11.5)

## 2020-07-21 LAB — IRON,TIBC AND FERRITIN PANEL
%SAT: 34 % (calc) (ref 20–48)
Ferritin: 86 ng/mL (ref 24–380)
Iron: 113 ug/dL (ref 50–180)
TIBC: 329 mcg/dL (calc) (ref 250–425)

## 2020-07-22 ENCOUNTER — Other Ambulatory Visit: Payer: Self-pay | Admitting: Family

## 2020-07-22 DIAGNOSIS — K7460 Unspecified cirrhosis of liver: Secondary | ICD-10-CM

## 2020-07-22 DIAGNOSIS — R188 Other ascites: Secondary | ICD-10-CM

## 2020-07-23 ENCOUNTER — Ambulatory Visit (INDEPENDENT_AMBULATORY_CARE_PROVIDER_SITE_OTHER): Payer: Medicaid Other

## 2020-07-23 DIAGNOSIS — I471 Supraventricular tachycardia: Secondary | ICD-10-CM

## 2020-07-24 ENCOUNTER — Other Ambulatory Visit: Payer: Self-pay | Admitting: *Deleted

## 2020-07-24 MED ORDER — LEVOTHYROXINE SODIUM 50 MCG PO TABS: 50.0000 ug | ORAL_TABLET | Freq: Every day | ORAL | 1 refills | Status: DC

## 2020-07-25 ENCOUNTER — Ambulatory Visit (HOSPITAL_COMMUNITY)
Admission: RE | Admit: 2020-07-25 | Discharge: 2020-07-25 | Disposition: A | Discharge status: Home / Self Care | Payer: Medicaid Other | Source: Ambulatory Visit | Attending: Internal Medicine | Admitting: Internal Medicine

## 2020-07-25 ENCOUNTER — Encounter (HOSPITAL_COMMUNITY)
Admission: RE | Disposition: A | Discharge status: Home / Self Care | Payer: Self-pay | Source: Ambulatory Visit | Attending: Internal Medicine

## 2020-07-25 ENCOUNTER — Encounter (HOSPITAL_COMMUNITY): Payer: Self-pay | Admitting: Internal Medicine

## 2020-07-25 ENCOUNTER — Ambulatory Visit (HOSPITAL_COMMUNITY): Payer: Medicaid Other | Admitting: Certified Registered"

## 2020-07-25 DIAGNOSIS — I8510 Secondary esophageal varices without bleeding: Secondary | ICD-10-CM | POA: Insufficient documentation

## 2020-07-25 DIAGNOSIS — K317 Polyp of stomach and duodenum: Secondary | ICD-10-CM | POA: Diagnosis not present

## 2020-07-25 DIAGNOSIS — Z85038 Personal history of other malignant neoplasm of large intestine: Secondary | ICD-10-CM | POA: Insufficient documentation

## 2020-07-25 DIAGNOSIS — K7030 Alcoholic cirrhosis of liver without ascites: Secondary | ICD-10-CM

## 2020-07-25 DIAGNOSIS — Z87891 Personal history of nicotine dependence: Secondary | ICD-10-CM | POA: Diagnosis not present

## 2020-07-25 DIAGNOSIS — K766 Portal hypertension: Secondary | ICD-10-CM | POA: Diagnosis not present

## 2020-07-25 DIAGNOSIS — K2289 Other specified disease of esophagus: Secondary | ICD-10-CM | POA: Diagnosis not present

## 2020-07-25 DIAGNOSIS — Z13810 Encounter for screening for upper gastrointestinal disorder: Secondary | ICD-10-CM | POA: Diagnosis not present

## 2020-07-25 DIAGNOSIS — Z79899 Other long term (current) drug therapy: Secondary | ICD-10-CM | POA: Insufficient documentation

## 2020-07-25 DIAGNOSIS — K644 Residual hemorrhoidal skin tags: Secondary | ICD-10-CM | POA: Diagnosis not present

## 2020-07-25 DIAGNOSIS — K3189 Other diseases of stomach and duodenum: Secondary | ICD-10-CM | POA: Diagnosis not present

## 2020-07-25 DIAGNOSIS — Z886 Allergy status to analgesic agent status: Secondary | ICD-10-CM | POA: Diagnosis not present

## 2020-07-25 DIAGNOSIS — K7460 Unspecified cirrhosis of liver: Secondary | ICD-10-CM | POA: Insufficient documentation

## 2020-07-25 DIAGNOSIS — D123 Benign neoplasm of transverse colon: Secondary | ICD-10-CM | POA: Insufficient documentation

## 2020-07-25 DIAGNOSIS — Z880 Allergy status to penicillin: Secondary | ICD-10-CM | POA: Insufficient documentation

## 2020-07-25 DIAGNOSIS — Z888 Allergy status to other drugs, medicaments and biological substances status: Secondary | ICD-10-CM | POA: Insufficient documentation

## 2020-07-25 DIAGNOSIS — Z08 Encounter for follow-up examination after completed treatment for malignant neoplasm: Secondary | ICD-10-CM | POA: Insufficient documentation

## 2020-07-25 DIAGNOSIS — Z86010 Personal history of colonic polyps: Secondary | ICD-10-CM | POA: Diagnosis not present

## 2020-07-25 DIAGNOSIS — K648 Other hemorrhoids: Secondary | ICD-10-CM | POA: Insufficient documentation

## 2020-07-25 HISTORY — PX: ESOPHAGEAL BANDING: SHX5518

## 2020-07-25 HISTORY — PX: ESOPHAGOGASTRODUODENOSCOPY (EGD) WITH PROPOFOL: SHX5813

## 2020-07-25 HISTORY — PX: COLONOSCOPY WITH PROPOFOL: SHX5780

## 2020-07-25 HISTORY — PX: POLYPECTOMY: SHX5525

## 2020-07-25 SURGERY — COLONOSCOPY WITH PROPOFOL
Anesthesia: General

## 2020-07-25 MED ORDER — PROPOFOL 10 MG/ML IV BOLUS
INTRAVENOUS | Status: DC | PRN
  Administered 2020-07-25: 100 mg via INTRAVENOUS

## 2020-07-25 MED ORDER — LACTATED RINGERS IV SOLN: INTRAVENOUS | Status: DC

## 2020-07-25 MED ORDER — MIDAZOLAM HCL 2 MG/2ML IJ SOLN
INTRAMUSCULAR | Status: AC
  Filled 2020-07-25: qty 2

## 2020-07-25 MED ORDER — MIDAZOLAM HCL 2 MG/2ML IJ SOLN
INTRAMUSCULAR | Status: DC | PRN
  Administered 2020-07-25: 2 mg via INTRAVENOUS

## 2020-07-25 MED ORDER — PROPOFOL 500 MG/50ML IV EMUL
INTRAVENOUS | Status: DC | PRN
  Administered 2020-07-25: 150 ug/kg/min via INTRAVENOUS

## 2020-07-25 MED ORDER — LIDOCAINE HCL (CARDIAC) PF 100 MG/5ML IV SOSY
PREFILLED_SYRINGE | INTRAVENOUS | Status: DC | PRN
  Administered 2020-07-25: 50 mg via INTRAVENOUS

## 2020-07-25 NOTE — Op Note (Signed)
[facility] Patient Name: Earl [name] Procedure Date: 07/25/2020 11:44 AM MRN: [id number] Date of Birth: [birth year] Attending MD: Hildred Laser , MD CSN: [id number] Age: 60 Admit Type: Outpatient Procedure:                Colonoscopy Indications:              High risk colon cancer surveillance: Personal                            history of colon cancer Providers:                Hildred Laser, MD, Otis Peak B. Sharon Seller, RN, Casimer Bilis, Technician Referring MD:             Theador Hawthorne Hawks, FNP Medicines:                Propofol per Anesthesia Complications:            No immediate complications. Estimated Blood Loss:     Estimated blood loss was minimal. Procedure:                Pre-Anesthesia Assessment:                           - Prior to the procedure, a History and Physical                            was performed, and patient medications and                            allergies were reviewed. [name] tolerance of                            previous anesthesia was also reviewed. The risks                            and benefits of the procedure and the sedation                            options and risks were discussed with the patient.                            All questions were answered, and informed consent                            was obtained. Prior Anticoagulants: The patient has                            taken no previous anticoagulant or antiplatelet                            agents. ASA Grade Assessment: III - A patient with  severe systemic disease. After reviewing the risks                            and benefits, the patient was deemed in                            satisfactory condition to undergo the procedure.                           After obtaining informed consent, the colonoscope                            was passed under direct vision. Throughout the                             procedure, the patient's blood pressure, pulse, and                            oxygen saturations were monitored continuously. The                            [id number]-H190DL ([id number]) scope was introduced through                            the anus and advanced to the the cecum, identified                            by appendiceal orifice and ileocecal valve. The                            colonoscopy was performed without difficulty. The                            patient tolerated the procedure well. The quality                            of the bowel preparation was excellent. The                            ileocecal valve, appendiceal orifice, and rectum                            were photographed. Scope In: 11:43:44 AM Scope Out: 26:71:24 PM Scope Withdrawal Time: 0 hours 17 minutes 39 seconds  Total Procedure Duration: 0 hours 22 minutes 34 seconds  Findings:      The perianal and digital rectal examinations were normal.      Two sessile polyps were found in the splenic flexure. The polyps were 3       to 5 mm in size. These polyps were removed with a cold snare. Resection       was complete, but the polyp tissue was only partially retrieved. The       pathology specimen was placed into Bottle Number 1.      External and internal hemorrhoids were found during retroflexion. The  hemorrhoids were small. Impression:               - Two 3 to 5 mm polyps at the splenic flexure,                            removed with a cold snare. Complete resection.                            Partial retrieval.                           - External and internal hemorrhoids. Moderate Sedation:      Per Anesthesia Care Recommendation:           - Patient has a contact number available for                            emergencies. The signs and symptoms of potential                            delayed complications were discussed with the                            patient. Return to normal activities  tomorrow.                            Written discharge instructions were provided to the                            patient.                           - Mechanical soft diet today. Usual diet starting                            tomorrow morning.                           - Continue present medications.                           - No aspirin, ibuprofen, naproxen, or other                            non-steroidal anti-inflammatory drugs.                           - Await pathology results.                           - Repeat colonoscopy in 5 years for surveillance. Procedure Code(s):        --- Professional ---                           [id number], Colonoscopy, flexible; with removal of  tumor(s), polyp(s), or other lesion(s) by snare                            technique Diagnosis Code(s):        --- Professional ---                           H29.290, Personal history of other malignant                            neoplasm of large intestine                           K63.5, Polyp of colon                           K64.8, Other hemorrhoids CPT copyright 2019 American Medical Association. All rights reserved. The codes documented in this report are preliminary and upon coder review may  be revised to meet current compliance requirements. Hildred Laser, MD Hildred Laser, MD 07/25/2020 12:22:26 PM This report has been signed electronically. Number of Addenda: 0

## 2020-07-25 NOTE — Transfer of Care (Signed)
Immediate Anesthesia Transfer of Care Note  Patient: Earl [name]  Procedure(s) Performed: COLONOSCOPY WITH PROPOFOL ESOPHAGOGASTRODUODENOSCOPY (EGD) WITH PROPOFOL POLYPECTOMY ESOPHAGEAL BANDING  Patient Location: Short Stay  Anesthesia Type:General  Level of Consciousness: awake, alert , oriented and patient cooperative  Airway & Oxygen Therapy: Patient Spontanous Breathing  Post-op Assessment: Report given to RN, Post -op Vital signs reviewed and stable and Patient moving all extremities  Post vital signs: Reviewed and stable  Last Vitals:  Vitals Value Taken Time  BP    Temp    Pulse    Resp    SpO2      Last Pain:  Vitals:   07/25/20 1123  TempSrc:   PainSc: 5       Patients Stated Pain Goal: 7 (21/19/41 7408)  Complications: No notable events documented.

## 2020-07-25 NOTE — Anesthesia Preprocedure Evaluation (Signed)
Anesthesia Evaluation  Patient identified by MRN, date of birth, ID band Patient awake    Reviewed: Allergy & Precautions, H&P , NPO status , Patient's Chart, lab work & pertinent test results, reviewed documented beta blocker date and time   Airway Mallampati: II  TM Distance: >3 FB Neck ROM: full    Dental no notable dental hx.    Pulmonary neg pulmonary ROS, former smoker,    Pulmonary exam normal breath sounds clear to auscultation       Cardiovascular Exercise Tolerance: Good hypertension, + dysrhythmias (-) pacemaker Rhythm:regular Rate:Normal     Neuro/Psych PSYCHIATRIC DISORDERS Anxiety Depression negative neurological ROS     GI/Hepatic negative GI ROS, Neg liver ROS,   Endo/Other  Hypothyroidism   Renal/GU Renal disease  negative genitourinary   Musculoskeletal   Abdominal   Peds  Hematology negative hematology ROS (+)   Anesthesia Other Findings Implanted cardiac loop recorder  Reproductive/Obstetrics negative OB ROS                             Anesthesia Physical Anesthesia Plan  ASA: 3  Anesthesia Plan: General   Post-op Pain Management:    Induction:   PONV Risk Score and Plan:   Airway Management Planned:   Additional Equipment:   Intra-op Plan:   Post-operative Plan:   Informed Consent: I have reviewed the patients History and Physical, chart, labs and discussed the procedure including the risks, benefits and alternatives for the proposed anesthesia with the patient or authorized representative who has indicated his/her understanding and acceptance.     Dental Advisory Given  Plan Discussed with: CRNA  Anesthesia Plan Comments:         Anesthesia Quick Evaluation

## 2020-07-25 NOTE — Op Note (Signed)
[facility] Patient Name: Earl [name] Procedure Date: 07/25/2020 11:06 AM MRN: [id number] Date of Birth: 12-31-1960 Attending MD: Hildred Laser , MD CSN: [id number] Age: 60 Admit Type: Outpatient Procedure:                Upper GI endoscopy Indications:              Cirrhosis with suspected esophageal varices Providers:                Hildred Laser, MD, Gwenlyn Fudge, RN, Kristine L.                            Risa Grill, Technician Referring MD:             Theador Hawthorne. Hawks, FNP Medicines:                Propofol per Anesthesia Complications:            No immediate complications. Estimated Blood Loss:     Estimated blood loss: none. Procedure:                Pre-Anesthesia Assessment:                           - Prior to the procedure, a History and Physical                            was performed, and patient medications and                            allergies were reviewed. [name] tolerance of                            previous anesthesia was also reviewed. The risks                            and benefits of the procedure and the sedation                            options and risks were discussed with the patient.                            All questions were answered, and informed consent                            was obtained. Prior Anticoagulants: The patient has                            taken no previous anticoagulant or antiplatelet                            agents. ASA Grade Assessment: III - A patient with                            severe systemic disease. After reviewing the risks  and benefits, the patient was deemed in                            satisfactory condition to undergo the procedure.                           After obtaining informed consent, the endoscope was                            passed under direct vision. Throughout the                            procedure, the patient's blood pressure, pulse, and                             oxygen saturations were monitored continuously. The                            [id number]-H190 ([id number]) scope was introduced through the                            mouth, and advanced to the second part of duodenum.                            The upper GI endoscopy was accomplished without                            difficulty. The patient tolerated the procedure                            well. Scope In: 11:28:44 AM Scope Out: 11:39:29 AM Total Procedure Duration: 0 hours 10 minutes 45 seconds  Findings:      The hypopharynx was normal.      The proximal esophagus and mid esophagus were normal.      Grade I, grade II varices were found in the distal esophagus. One band       was successfully placed with complete eradication, resulting in       deflation of varices. There was no bleeding during and at the end of the       procedure.      The Z-line was irregular and was found 41 cm from the incisors.      Mild portal hypertensive gastropathy was found in the gastric fundus and       in the gastric body.      A few small sessile polyps with no bleeding and no stigmata of recent       bleeding were found in the gastric fundus and in the gastric body.      The exam of the stomach was otherwise normal.      The duodenal bulb and second portion of the duodenum were normal. Impression:               - Normal hypopharynx.                           - Normal proximal esophagus and mid esophagus.                           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 Only one                            column large enough to be banded. Completely                            eradicated.                           - Z-line irregular, 41 cm from the incisors.                           - Portal hypertensive gastropathy.                           - A few gastric polyps.                           - Normal duodenal bulb and second portion of the                            duodenum.                            - No specimens collected. Moderate Sedation:      Per Anesthesia Care Recommendation:           - Patient has a contact number available for                            emergencies. The signs and symptoms of potential                            delayed complications were discussed with the                            patient. Return to normal activities tomorrow.                            Written discharge instructions were provided to the                            patient.                           - Mechanical soft diet today.                           - Continue present medications.                           - No aspirin, ibuprofen, naproxen, or other                            non-steroidal anti-inflammatory drugs.                           -  Repeat upper endoscopy in 6 months. Procedure Code(s):        --- Professional ---                           [phone], Esophagogastroduodenoscopy, flexible,                            transoral; with band ligation of esophageal/gastric                            varices Diagnosis Code(s):        --- Professional ---                           K74.60, Unspecified cirrhosis of liver                           I85.10, Secondary esophageal varices without                            bleeding                           K22.8, Other specified diseases of esophagus                           K76.6, Portal hypertension                           K31.89, Other diseases of stomach and duodenum                           K31.7, Polyp of stomach and duodenum CPT copyright 2019 American Medical Association. All rights reserved. The codes documented in this report are preliminary and upon coder review may  be revised to meet current compliance requirements. Hildred Laser, MD Hildred Laser, MD 07/25/2020 12:18:02 PM This report has been signed electronically. Number of Addenda: 0

## 2020-07-25 NOTE — H&P (Signed)
Earl [name] is an 60 y.o. male.   Chief Complaint: Patient is here for esophagogastroduodenoscopy and colonoscopy. HPI: Patient is 60 year old Caucasian male who was found to have pedunculated polyp on screening colonoscopy of March 2018.  Histology revealed invasive adenocarcinoma.  He decided not to have surgery.  He has remained in remission.  His last colonoscopy was in April 2019.  He denies abdominal pain change in bowel habits or rectal bleeding.  He was diagnosed with cirrhosis last year when he presented with new onset of ascites.  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  He has quit drinking alcohol in has lost weight.  His hepatic function has improved.  He is undergoing EGD to assess for esophageal varices and if there large these will be banded for primary prophylaxis. Family history is negative for colorectal carcinoma His father died of cirrhosis?  NASH.  Past Medical History:  Diagnosis Date   Alcohol abuse    Anxiety    Arthritis    Colon cancer ([place])    Depression    Dysrhythmia    Gall stones    Heart burn    Hyperlipidemia    Hypertension    Hypothyroidism    Kidney stone    Liver disease    Tachycardia    Typical atrial flutter [facility])     Past Surgical History:  Procedure Laterality Date   A-FLUTTER ABLATION N/A 07/31/2016   Procedure: A-Flutter Ablation;  Surgeon: [name], MD;  Location: [place] [facility];  Service: Cardiovascular;  Laterality: N/A;   COLONOSCOPY N/A 04/17/2016   Procedure: COLONOSCOPY;  Surgeon: [name], MD;  [facility];  Service: Endoscopy;  Laterality: N/A;  1200   COLONOSCOPY N/A 05/07/2017   Procedure: COLONOSCOPY;  Surgeon: [name], MD;  [facility];  Service: Endoscopy;  Laterality: N/A;  200   implantable loop recorder placement  01/24/2019    Medtronic Reveal Linq model LNQ 11 implantable loop recorder (SN [id number] ) by Dr Rayann Heman for evaluation of palpitations   MINOR CARPAL TUNNEL      right   POLYPECTOMY  04/17/2016   Procedure: POLYPECTOMY;  Surgeon: [name], MD;  [facility];  Service: Endoscopy;;  sigmoid    Family History  Problem Relation Age of Onset   Alzheimer's disease Mother    Heart disease Father    Coronary artery disease Father    Liver disease Father    Thyroid disease Father    Hyperlipidemia Father    Heart attack Father    Coronary artery disease Paternal Grandfather    Social History:  reports that he quit smoking about 41 years ago. His smoking use included cigarettes. He has never used smokeless tobacco. He reports previous alcohol use of about 8.0 standard drinks of alcohol per week. He reports that he does not use drugs.  Allergies:  Allergies  Allergen Reactions   Amitriptyline Anaphylaxis   Amoxicillin Anaphylaxis and Rash    Has patient had a PCN reaction causing immediate rash, facial/tongue/throat swelling, SOB or lightheadedness with hypotension: Yes Has patient had a PCN reaction causing severe rash involving mucus membranes or skin necrosis: No Has patient had a PCN reaction that required hospitalization: No Has patient had a PCN reaction occurring within the last 10 years: Yes If all of the above answers are "NO", then may proceed with Cephalosporin use.    Asa [Aspirin] Anaphylaxis   Penicillin G Anaphylaxis  Clonidine Derivatives Other (See Comments)    Severe Dry Mouth   Hydrochlorothiazide     Caused rectal bleeding and hematuria   Levothyroxine Nausea And Vomiting    Patient states that the 75 mcg is too strong for him.   Trintellix [Vortioxetine] Nausea And Vomiting    Medications Prior to Admission  Medication Sig Dispense Refill   ALPRAZolam (XANAX) 1 MG tablet Take 1 mg by mouth 2 (two) times daily as needed for anxiety.      cyanocobalamin (,VITAMIN B-12,) 1000 MCG/ML injection Inject 1 mL (1,000 mcg total) into the muscle every 30 (thirty) days. 1 mL 6   diltiazem (CARDIZEM CD) 300 MG 24  hr capsule Take 1 capsule (300 mg total) by mouth daily. 30 capsule 3   ergocalciferol (VITAMIN D2) 1.25 MG (50000 UT) capsule Vitamin D2 1,250 mcg (50,000 unit) capsule  TAKE 1 CAPSULE BY MOUTH EVERY WEEK     furosemide (LASIX) 20 MG tablet TAKE 1 TABLET BY MOUTH EVERY DAY 30 tablet 1   LYRICA 200 MG capsule Take 600 mg by mouth daily.  2   ondansetron (ZOFRAN) 4 MG tablet TAKE 1 TABLET BY MOUTH TWICE DAILY AS NEEDED FOR NAUSEA OR FOR VOMITING 30 tablet 1   Oxycodone HCl 10 MG TABS oxycodone 10 mg tablet  Take 1 tablet twice a day by oral route as needed.     pantoprazole (PROTONIX) 40 MG tablet TAKE 1 TABLET BY MOUTH DAILY BEFORE breakfast 90 tablet 3   polyethylene glycol-electrolytes (TRILYTE) 420 g solution Take 4,000 mLs by mouth as directed. 4000 mL 0   sertraline (ZOLOFT) 50 MG tablet Take 50 mg by mouth daily.     spironolactone (ALDACTONE) 50 MG tablet TAKE 1 TABLET BY MOUTH EVERY DAY 30 tablet 0   levothyroxine (SYNTHROID) 50 MCG tablet Take 1 tablet (50 mcg total) by mouth daily before breakfast. 90 tablet 1   psyllium (METAMUCIL SMOOTH TEXTURE) 58.6 % powder Take 1 packet by mouth 3 (three) times daily. 283 g 12    No results found for this or any previous visit (from the past 48 hour(s)). No results found.  Review of Systems  Blood pressure (!) 142/81, pulse 63, temperature 97.7 F (36.5 C), temperature source Oral, resp. rate 18, SpO2 97 %. Physical Exam HENT:     Mouth/Throat:     Mouth: Mucous membranes are moist.     Pharynx: Oropharynx is clear.  Eyes:     General: No scleral icterus.    Conjunctiva/sclera: Conjunctivae normal.  Cardiovascular:     Rate and Rhythm: Normal rate and regular rhythm.     Heart sounds: Normal heart sounds. No murmur heard. Pulmonary:     Effort: Pulmonary effort is normal.     Breath sounds: Normal breath sounds.  Musculoskeletal:        General: No swelling.     Cervical back: Neck supple.  Lymphadenopathy:     Cervical: No  cervical adenopathy.  Skin:    General: Skin is warm and dry.  Neurological:     Mental Status: He is alert.     Assessment/Plan Cirrhosis. History of colon carcinoma(invasive adenocarcinoma and a polyp) Esophagogastroduodenoscopy to screen for esophageal varices with banding if indicated Surveillance colonoscopy   Hildred Laser, MD 07/25/2020, 11:17 AM

## 2020-07-25 NOTE — Anesthesia Procedure Notes (Signed)
Date/Time: 07/25/2020 11:30 AM Performed by: Orlie Dakin, CRNA Pre-anesthesia Checklist: Patient identified, Emergency Drugs available, Suction available and Patient being monitored Patient Re-evaluated:Patient Re-evaluated prior to induction Oxygen Delivery Method: Nasal cannula Induction Type: IV induction Placement Confirmation: positive ETCO2

## 2020-07-25 NOTE — Discharge Instructions (Addendum)
No aspirin or NSAIDs. Resume scheduled medications as before. Mechanical soft diet for 24 hours.  Thereafter usual diet. No driving for 24 hours. Physician will call with biopsy results.  PATIENT INSTRUCTIONS POST-ANESTHESIA  IMMEDIATELY FOLLOWING SURGERY:  Do not drive or operate machinery for the first twenty four hours after surgery.  Do not make any important decisions for twenty four hours after surgery or while taking narcotic pain medications or sedatives.  If you develop intractable nausea and vomiting or a severe headache please notify your doctor immediately.  FOLLOW-UP:  Please make an appointment with your surgeon as instructed. You do not need to follow up with anesthesia unless specifically instructed to do so.  WOUND CARE INSTRUCTIONS (if applicable):  Keep a dry clean dressing on the anesthesia/puncture wound site if there is drainage.  Once the wound has quit draining you may leave it open to air.  Generally you should leave the bandage intact for twenty four hours unless there is drainage.  If the epidural site drains for more than 36-48 hours please call the anesthesia department.  QUESTIONS?:  Please feel free to call your physician or the hospital operator if you have any questions, and they will be happy to assist you.

## 2020-07-26 NOTE — Anesthesia Postprocedure Evaluation (Signed)
Anesthesia Post Note  Patient: Earl [name]  Procedure(s) Performed: COLONOSCOPY WITH PROPOFOL ESOPHAGOGASTRODUODENOSCOPY (EGD) WITH PROPOFOL POLYPECTOMY ESOPHAGEAL BANDING  Patient location during evaluation: Phase II Anesthesia Type: General Level of consciousness: awake Pain management: pain level controlled Vital Signs Assessment: post-procedure vital signs reviewed and stable Respiratory status: spontaneous breathing and respiratory function stable Cardiovascular status: blood pressure returned to baseline and stable Postop Assessment: no headache and no apparent nausea or vomiting Anesthetic complications: no Comments: Late entry   No notable events documented.   Last Vitals:  Vitals:   07/25/20 1049 07/25/20 1211  BP: (!) 142/81 117/80  Pulse: 63 71  Resp: 18 18  Temp: 36.5 C 36.5 C  SpO2: 97% 97%    Last Pain:  Vitals:   07/25/20 1211  TempSrc: Oral  PainSc: 0-No pain                 Louann Sjogren

## 2020-07-27 LAB — SURGICAL PATHOLOGY

## 2020-08-01 ENCOUNTER — Encounter (HOSPITAL_COMMUNITY): Payer: Self-pay | Admitting: Internal Medicine

## 2020-08-09 NOTE — Progress Notes (Signed)
Carelink Summary Report / Loop Recorder 

## 2020-08-23 ENCOUNTER — Other Ambulatory Visit: Payer: Self-pay | Admitting: *Deleted

## 2020-08-23 ENCOUNTER — Other Ambulatory Visit: Payer: Self-pay | Admitting: Family

## 2020-08-23 ENCOUNTER — Ambulatory Visit (INDEPENDENT_AMBULATORY_CARE_PROVIDER_SITE_OTHER): Payer: Medicaid Other

## 2020-08-23 DIAGNOSIS — I472 Ventricular tachycardia: Secondary | ICD-10-CM

## 2020-08-23 DIAGNOSIS — I4729 Other ventricular tachycardia: Secondary | ICD-10-CM

## 2020-08-23 DIAGNOSIS — K7460 Unspecified cirrhosis of liver: Secondary | ICD-10-CM

## 2020-08-23 DIAGNOSIS — R188 Other ascites: Secondary | ICD-10-CM

## 2020-08-23 MED ORDER — DILTIAZEM HCL ER COATED BEADS 300 MG PO CP24: 300.0000 mg | ORAL_CAPSULE | Freq: Every day | ORAL | 0 refills | Status: DC

## 2020-08-24 LAB — CUP PACEART REMOTE DEVICE CHECK
Date Time Interrogation Session: 20220726011549
Implantable Pulse Generator Implant Date: 20201228

## 2020-09-18 DIAGNOSIS — F419 Anxiety disorder, unspecified: Secondary | ICD-10-CM | POA: Diagnosis not present

## 2020-09-18 DIAGNOSIS — M545 Low back pain, unspecified: Secondary | ICD-10-CM | POA: Diagnosis not present

## 2020-09-18 DIAGNOSIS — I10 Essential (primary) hypertension: Secondary | ICD-10-CM | POA: Diagnosis not present

## 2020-09-18 DIAGNOSIS — R251 Tremor, unspecified: Secondary | ICD-10-CM | POA: Diagnosis not present

## 2020-09-18 DIAGNOSIS — M5412 Radiculopathy, cervical region: Secondary | ICD-10-CM | POA: Diagnosis not present

## 2020-09-18 DIAGNOSIS — F331 Major depressive disorder, recurrent, moderate: Secondary | ICD-10-CM | POA: Diagnosis not present

## 2020-09-18 DIAGNOSIS — E559 Vitamin D deficiency, unspecified: Secondary | ICD-10-CM | POA: Diagnosis not present

## 2020-09-18 DIAGNOSIS — Z7689 Persons encountering health services in other specified circumstances: Secondary | ICD-10-CM | POA: Diagnosis not present

## 2020-09-18 DIAGNOSIS — M542 Cervicalgia: Secondary | ICD-10-CM | POA: Diagnosis not present

## 2020-09-18 DIAGNOSIS — M25519 Pain in unspecified shoulder: Secondary | ICD-10-CM | POA: Diagnosis not present

## 2020-09-18 DIAGNOSIS — Z79891 Long term (current) use of opiate analgesic: Secondary | ICD-10-CM | POA: Diagnosis not present

## 2020-09-18 DIAGNOSIS — G609 Hereditary and idiopathic neuropathy, unspecified: Secondary | ICD-10-CM | POA: Diagnosis not present

## 2020-09-19 ENCOUNTER — Ambulatory Visit: Payer: Medicaid Other | Admitting: Family

## 2020-09-19 NOTE — Progress Notes (Signed)
Carelink Summary Report / Loop Recorder 

## 2020-09-25 ENCOUNTER — Ambulatory Visit (INDEPENDENT_AMBULATORY_CARE_PROVIDER_SITE_OTHER): Payer: Medicaid Other

## 2020-09-25 DIAGNOSIS — R002 Palpitations: Secondary | ICD-10-CM | POA: Diagnosis not present

## 2020-09-25 LAB — CUP PACEART REMOTE DEVICE CHECK
Date Time Interrogation Session: 20220828011931
Implantable Pulse Generator Implant Date: 20201228

## 2020-10-04 ENCOUNTER — Other Ambulatory Visit: Payer: Self-pay | Admitting: Internal Medicine

## 2020-10-04 NOTE — Telephone Encounter (Signed)
This is a [place] pt °

## 2020-10-08 NOTE — Progress Notes (Signed)
Carelink Summary Report / Loop Recorder 

## 2020-10-17 ENCOUNTER — Other Ambulatory Visit: Payer: Self-pay | Admitting: Family

## 2020-10-17 DIAGNOSIS — R188 Other ascites: Secondary | ICD-10-CM

## 2020-10-17 DIAGNOSIS — K7460 Unspecified cirrhosis of liver: Secondary | ICD-10-CM

## 2020-10-20 ENCOUNTER — Other Ambulatory Visit: Payer: Self-pay | Admitting: Internal Medicine

## 2020-10-22 NOTE — Telephone Encounter (Signed)
Pt has made his apt so he's able to get his refill   Please send refill to [facility]

## 2020-10-23 DIAGNOSIS — M25519 Pain in unspecified shoulder: Secondary | ICD-10-CM | POA: Diagnosis not present

## 2020-10-23 DIAGNOSIS — F419 Anxiety disorder, unspecified: Secondary | ICD-10-CM | POA: Diagnosis not present

## 2020-10-23 DIAGNOSIS — M542 Cervicalgia: Secondary | ICD-10-CM | POA: Diagnosis not present

## 2020-10-23 DIAGNOSIS — M545 Low back pain, unspecified: Secondary | ICD-10-CM | POA: Diagnosis not present

## 2020-10-23 DIAGNOSIS — G2581 Restless legs syndrome: Secondary | ICD-10-CM | POA: Diagnosis not present

## 2020-10-23 DIAGNOSIS — M5412 Radiculopathy, cervical region: Secondary | ICD-10-CM | POA: Diagnosis not present

## 2020-10-23 DIAGNOSIS — E559 Vitamin D deficiency, unspecified: Secondary | ICD-10-CM | POA: Diagnosis not present

## 2020-10-23 DIAGNOSIS — F331 Major depressive disorder, recurrent, moderate: Secondary | ICD-10-CM | POA: Diagnosis not present

## 2020-10-23 DIAGNOSIS — R251 Tremor, unspecified: Secondary | ICD-10-CM | POA: Diagnosis not present

## 2020-10-23 DIAGNOSIS — Z79891 Long term (current) use of opiate analgesic: Secondary | ICD-10-CM | POA: Diagnosis not present

## 2020-10-23 DIAGNOSIS — G609 Hereditary and idiopathic neuropathy, unspecified: Secondary | ICD-10-CM | POA: Diagnosis not present

## 2020-10-23 DIAGNOSIS — Z7689 Persons encountering health services in other specified circumstances: Secondary | ICD-10-CM | POA: Diagnosis not present

## 2020-10-29 ENCOUNTER — Ambulatory Visit (INDEPENDENT_AMBULATORY_CARE_PROVIDER_SITE_OTHER): Payer: Medicaid Other

## 2020-10-29 DIAGNOSIS — I471 Supraventricular tachycardia: Secondary | ICD-10-CM | POA: Diagnosis not present

## 2020-10-30 ENCOUNTER — Ambulatory Visit (INDEPENDENT_AMBULATORY_CARE_PROVIDER_SITE_OTHER): Payer: Medicaid Other | Admitting: Internal Medicine

## 2020-10-31 LAB — CUP PACEART REMOTE DEVICE CHECK
Date Time Interrogation Session: 20220930012230
Implantable Pulse Generator Implant Date: 20201228

## 2020-11-02 ENCOUNTER — Encounter: Payer: Self-pay | Admitting: Internal Medicine

## 2020-11-02 ENCOUNTER — Ambulatory Visit (INDEPENDENT_AMBULATORY_CARE_PROVIDER_SITE_OTHER): Payer: Medicaid Other | Admitting: Internal Medicine

## 2020-11-02 VITALS — BP 114/68 | HR 82 | Ht 70.0 in | Wt 260.0 lb

## 2020-11-02 DIAGNOSIS — I471 Supraventricular tachycardia: Secondary | ICD-10-CM | POA: Diagnosis not present

## 2020-11-02 DIAGNOSIS — F1010 Alcohol abuse, uncomplicated: Secondary | ICD-10-CM

## 2020-11-02 DIAGNOSIS — R002 Palpitations: Secondary | ICD-10-CM

## 2020-11-02 DIAGNOSIS — Z7689 Persons encountering health services in other specified circumstances: Secondary | ICD-10-CM | POA: Diagnosis not present

## 2020-11-02 NOTE — Progress Notes (Signed)
PCP: Sharion Balloon, FNP   Primary EP: Dr Glennie Isle is a 60 y.o. male who presents today for routine electrophysiology followup.  Since last being seen in our clinic, the patient reports doing very well.  Today, he denies symptoms of palpitations, chest pain, shortness of breath,  lower extremity edema, dizziness, presyncope, or syncope.  The patient is otherwise without complaint today.   Past Medical History:  Diagnosis Date   Alcohol abuse    Anxiety    Arthritis    Colon cancer ([place])    Depression    Dysrhythmia    Gall stones    Heart burn    Hyperlipidemia    Hypertension    Hypothyroidism    Kidney stone    Liver disease    Tachycardia    Typical atrial flutter [facility])    Past Surgical History:  Procedure Laterality Date   A-FLUTTER ABLATION N/A 07/31/2016   Procedure: A-Flutter Ablation;  Surgeon: [name], MD;  Location: [place] [facility];  Service: Cardiovascular;  Laterality: N/A;   COLONOSCOPY N/A 04/17/2016   Procedure: COLONOSCOPY;  Surgeon: [name], MD;  [facility];  Service: Endoscopy;  Laterality: N/A;  1200   COLONOSCOPY N/A 05/07/2017   Procedure: COLONOSCOPY;  Surgeon: [name], MD;  [facility];  Service: Endoscopy;  Laterality: N/A;  200   COLONOSCOPY WITH PROPOFOL N/A 07/25/2020   Procedure: COLONOSCOPY WITH PROPOFOL;  Surgeon: [name], MD;  [facility];  Service: Endoscopy;  Laterality: N/A;  12:10   ESOPHAGEAL BANDING  07/25/2020   Procedure: ESOPHAGEAL BANDING;  Surgeon: [name], MD;  [facility];  Service: Endoscopy;;   ESOPHAGOGASTRODUODENOSCOPY (EGD) WITH PROPOFOL N/A 07/25/2020   Procedure: ESOPHAGOGASTRODUODENOSCOPY (EGD) WITH PROPOFOL;  Surgeon: [name], MD;  [facility];  Service: Endoscopy;  Laterality: N/A;   implantable loop recorder placement  01/24/2019    Medtronic Reveal Linq model LNQ 11 implantable loop recorder (SN  [id number] ) by Dr Rayann Heman for evaluation of palpitations   MINOR CARPAL TUNNEL     right   POLYPECTOMY  04/17/2016   Procedure: POLYPECTOMY;  Surgeon: [name], MD;  [facility];  Service: Endoscopy;;  sigmoid   POLYPECTOMY  07/25/2020   Procedure: POLYPECTOMY;  Surgeon: [name], MD;  [facility];  Service: Endoscopy;;    ROS- all systems are reviewed and negatives except as per HPI above  Current Outpatient Medications  Medication Sig Dispense Refill   ALPRAZolam (XANAX) 1 MG tablet Take 1 mg by mouth 2 (two) times daily as needed for anxiety.      cyanocobalamin (,VITAMIN B-12,) 1000 MCG/ML injection Inject 1 mL (1,000 mcg total) into the muscle every 30 (thirty) days. 1 mL 6   diltiazem (CARDIZEM CD) 300 MG 24 hr capsule TAKE ONE CAPSULE BY MOUTH DAILY (patient must have appointment FOR future refills) 10 capsule 0   furosemide (LASIX) 20 MG tablet TAKE 1 TABLET BY MOUTH EVERY DAY 30 tablet 0   levothyroxine (SYNTHROID) 50 MCG tablet Take 1 tablet (50 mcg total) by mouth daily before breakfast. 90 tablet 1   LYRICA 200 MG capsule Take 600 mg by mouth daily.  2   ondansetron (ZOFRAN) 4 MG tablet TAKE 1 TABLET BY MOUTH TWICE DAILY AS NEEDED FOR NAUSEA OR FOR VOMITING 30 tablet 1   Oxycodone HCl 10 MG TABS oxycodone  10 mg tablet  Take 1 tablet twice a day by oral route as needed.     pantoprazole (PROTONIX) 40 MG tablet TAKE 1 TABLET BY MOUTH DAILY BEFORE breakfast 90 tablet 3   ergocalciferol (VITAMIN D2) 1.25 MG (50000 UT) capsule Vitamin D2 1,250 mcg (50,000 unit) capsule  TAKE 1 CAPSULE BY MOUTH EVERY WEEK (Patient not taking: Reported on 11/02/2020)     polyethylene glycol-electrolytes (TRILYTE) 420 g solution Take 4,000 mLs by mouth as directed. (Patient not taking: Reported on 11/02/2020) 4000 mL 0   psyllium (METAMUCIL SMOOTH TEXTURE) 58.6 % powder Take 1 packet by mouth 3 (three) times daily. (Patient not taking: Reported on 11/02/2020) 283 g 12    sertraline (ZOLOFT) 50 MG tablet Take 50 mg by mouth daily. (Patient not taking: Reported on 11/02/2020)     spironolactone (ALDACTONE) 50 MG tablet TAKE 1 TABLET BY MOUTH EVERY DAY (Patient not taking: Reported on 11/02/2020) 30 tablet 0   No current facility-administered medications for this visit.    Physical Exam: Vitals:   11/02/20 0947  BP: 114/68  Pulse: 82  SpO2: 94%  Weight: 260 lb (117.9 kg)  Height: 5\' 10"  (1.778 m)    GEN- The patient is well appearing, alert and oriented x 3 today.   Head- normocephalic, atraumatic Eyes-  Sclera clear, conjunctiva pink Ears- hearing intact Oropharynx- clear Lungs- Clear to ausculation bilaterally, normal work of breathing Heart- Regular rate and rhythm, no murmurs, rubs or gallops, PMI not laterally displaced GI- soft, NT, ND, + BS Extremities- no clubbing, cyanosis, or edema  Wt Readings from Last 3 Encounters:  11/02/20 260 lb (117.9 kg)  07/20/20 234 lb (106.1 kg)  06/19/20 242 lb (109.8 kg)      Assessment and Plan:  SVT Well controlled by ILR review today S/p prior atach and CTI ablation  2. HTN Stable No change required today  3. ETOH Avoidance advised  4. Overweight Body mass index is 37.31 kg/m. Lifestyle modification advised  Risks, benefits and potential toxicities for medications prescribed and/or refilled reviewed with patient today.   Return in a year  [name] MD, [facility] 11/02/2020 9:57 AM

## 2020-11-02 NOTE — Patient Instructions (Signed)
Medication Instructions:  Continue all current medications.  Labwork: none  Testing/Procedures: none  Follow-Up: 1 year - Dr.  Allred   Any Other Special Instructions Will Be Listed Below (If Applicable).   If you need a refill on your cardiac medications before your next appointment, please call your pharmacy.  

## 2020-11-05 ENCOUNTER — Telehealth: Payer: Self-pay | Admitting: Internal Medicine

## 2020-11-05 ENCOUNTER — Other Ambulatory Visit: Payer: Self-pay | Admitting: Internal Medicine

## 2020-11-05 DIAGNOSIS — H524 Presbyopia: Secondary | ICD-10-CM | POA: Diagnosis not present

## 2020-11-05 MED ORDER — DILTIAZEM HCL ER COATED BEADS 300 MG PO CP24: ORAL_CAPSULE | ORAL | 6 refills | Status: DC

## 2020-11-05 NOTE — Progress Notes (Signed)
Carelink Summary Report / Loop Recorder 

## 2020-11-05 NOTE — Telephone Encounter (Signed)
*  STAT* If patient is at the pharmacy, call can be transferred to refill team.   1. Which medications need to be refilled? (please list name of each medication and dose if known) diltiazem (CARDIZEM CD) 300 MG 24 hr capsule   2. Which pharmacy/location (including street and city if local pharmacy) is medication to be sent to?  [place] [place] - [address]  3. Do they need a 30 day or 90 day supply? 90 ds

## 2020-11-05 NOTE — Telephone Encounter (Signed)
Refilled diltiazem 300 mg qd,#30 with RF: 6 to [facility].

## 2020-11-20 ENCOUNTER — Other Ambulatory Visit (HOSPITAL_BASED_OUTPATIENT_CLINIC_OR_DEPARTMENT_OTHER): Payer: Self-pay | Admitting: Neurology

## 2020-11-20 ENCOUNTER — Other Ambulatory Visit (HOSPITAL_COMMUNITY): Payer: Self-pay | Admitting: Neurology

## 2020-11-20 DIAGNOSIS — R600 Localized edema: Secondary | ICD-10-CM

## 2020-11-20 DIAGNOSIS — G2571 Drug induced akathisia: Secondary | ICD-10-CM | POA: Diagnosis not present

## 2020-11-20 DIAGNOSIS — M5412 Radiculopathy, cervical region: Secondary | ICD-10-CM | POA: Diagnosis not present

## 2020-11-20 DIAGNOSIS — F331 Major depressive disorder, recurrent, moderate: Secondary | ICD-10-CM | POA: Diagnosis not present

## 2020-11-20 DIAGNOSIS — E559 Vitamin D deficiency, unspecified: Secondary | ICD-10-CM | POA: Diagnosis not present

## 2020-11-20 DIAGNOSIS — M25519 Pain in unspecified shoulder: Secondary | ICD-10-CM | POA: Diagnosis not present

## 2020-11-20 DIAGNOSIS — G609 Hereditary and idiopathic neuropathy, unspecified: Secondary | ICD-10-CM | POA: Diagnosis not present

## 2020-11-20 DIAGNOSIS — G2581 Restless legs syndrome: Secondary | ICD-10-CM | POA: Diagnosis not present

## 2020-11-20 DIAGNOSIS — Z79891 Long term (current) use of opiate analgesic: Secondary | ICD-10-CM | POA: Diagnosis not present

## 2020-11-20 DIAGNOSIS — M545 Low back pain, unspecified: Secondary | ICD-10-CM | POA: Diagnosis not present

## 2020-11-20 DIAGNOSIS — F419 Anxiety disorder, unspecified: Secondary | ICD-10-CM | POA: Diagnosis not present

## 2020-11-20 DIAGNOSIS — Z7689 Persons encountering health services in other specified circumstances: Secondary | ICD-10-CM | POA: Diagnosis not present

## 2020-11-20 DIAGNOSIS — M542 Cervicalgia: Secondary | ICD-10-CM | POA: Diagnosis not present

## 2020-11-21 ENCOUNTER — Other Ambulatory Visit: Payer: Self-pay | Admitting: Family

## 2020-11-21 DIAGNOSIS — K7460 Unspecified cirrhosis of liver: Secondary | ICD-10-CM

## 2020-11-21 DIAGNOSIS — R188 Other ascites: Secondary | ICD-10-CM

## 2020-11-27 ENCOUNTER — Encounter: Payer: Self-pay | Admitting: Family

## 2020-11-27 ENCOUNTER — Other Ambulatory Visit: Payer: Self-pay | Admitting: Family

## 2020-11-27 ENCOUNTER — Other Ambulatory Visit: Payer: Self-pay

## 2020-11-27 ENCOUNTER — Ambulatory Visit: Payer: Medicaid Other | Admitting: Family

## 2020-11-27 ENCOUNTER — Telehealth: Payer: Self-pay | Admitting: Family

## 2020-11-27 VITALS — BP 101/58 | HR 91 | Temp 97.3°F | Ht 70.0 in | Wt 260.6 lb

## 2020-11-27 DIAGNOSIS — I484 Atypical atrial flutter: Secondary | ICD-10-CM | POA: Diagnosis not present

## 2020-11-27 DIAGNOSIS — E782 Mixed hyperlipidemia: Secondary | ICD-10-CM

## 2020-11-27 DIAGNOSIS — R5383 Other fatigue: Secondary | ICD-10-CM | POA: Diagnosis not present

## 2020-11-27 DIAGNOSIS — Z7689 Persons encountering health services in other specified circumstances: Secondary | ICD-10-CM | POA: Diagnosis not present

## 2020-11-27 DIAGNOSIS — G629 Polyneuropathy, unspecified: Secondary | ICD-10-CM | POA: Diagnosis not present

## 2020-11-27 DIAGNOSIS — E038 Other specified hypothyroidism: Secondary | ICD-10-CM

## 2020-11-27 DIAGNOSIS — K7460 Unspecified cirrhosis of liver: Secondary | ICD-10-CM

## 2020-11-27 DIAGNOSIS — K7030 Alcoholic cirrhosis of liver without ascites: Secondary | ICD-10-CM | POA: Diagnosis not present

## 2020-11-27 DIAGNOSIS — S8012XA Contusion of left lower leg, initial encounter: Secondary | ICD-10-CM

## 2020-11-27 DIAGNOSIS — R188 Other ascites: Secondary | ICD-10-CM

## 2020-11-27 DIAGNOSIS — F339 Major depressive disorder, recurrent, unspecified: Secondary | ICD-10-CM | POA: Diagnosis not present

## 2020-11-27 DIAGNOSIS — F1020 Alcohol dependence, uncomplicated: Secondary | ICD-10-CM

## 2020-11-27 NOTE — Patient Instructions (Signed)
Hematoma A hematoma is a collection of blood under the skin, in an organ, in a body space, in a joint space, or in other tissue. The blood can thicken (clot) to form a lump that you can see and feel. The lump is often firm and may become sore and tender. Most hematomas get better in a few days to weeks. However, some hematomas may be serious and require medical care. Hematomas can range from very small to very large. What are the causes? This condition is caused by: A blunt or penetrating injury. A leakage from a blood vessel under the skin. Some medical procedures, including surgeries, such as oral surgery, face lifts, and surgeries on the joints. Some medical conditions that cause bleeding or bruising. There may be multiple hematomas that appear in different areas of the body. What increases the risk? You are more likely to develop this condition if: You are an older adult. You use blood thinners. What are the signs or symptoms? Symptoms of this condition depend on where the hematoma is located.  Common symptoms of a hematoma that is under the skin include: A firm lump on the body. Pain and tenderness in the area. Bruising. Blue, dark blue, purple-red, or yellowish skin (discoloration) may appear at the site of the hematoma if the hematoma is close to the surface of the skin. Common symptoms of a hematoma that is deep in the tissues or body spaces may be less obvious. They include: A collection of blood in the stomach (intra-abdominal hematoma). This may cause pain in the abdomen, weakness, fainting, and shortness of breath. A collection of blood in the head (intracranial hematoma). This may cause a headache or symptoms such as weakness, trouble speaking or understanding, or a change in consciousness.  How is this diagnosed? This condition is diagnosed based on: Your medical history. A physical exam. Imaging tests, such as an ultrasound or CT scan. These may be needed if your health care  provider suspects a hematoma in deeper tissues or body spaces. Blood tests. These may be needed if your health care provider believes that the hematoma is caused by a medical condition. How is this treated? Treatment for this condition depends on the cause, size, and location of the hematoma. Treatment may include: Doing nothing. The majority of hematomas do not need treatment as many of them go away on their own over time. Surgery or close monitoring. This may be needed for large hematomas or hematomas that affect vital organs. Medicines. Medicines may be given if there is an underlying medical cause for the hematoma. Follow these instructions at home: Managing pain, stiffness, and swelling  If directed, put ice on the affected area. Put ice in a plastic bag. Place a towel between your skin and the bag. Leave the ice on for 20 minutes, 2-3 times a day for the first couple of days. If directed, apply heat to the affected area after applying ice for a couple of days. Use the heat source that your health care provider recommends, such as a moist heat pack or a heating pad. Place a towel between your skin and the heat source. Leave the heat on for 20-30 minutes. Remove the heat if your skin turns bright red. This is especially important if you are unable to feel pain, heat, or cold. You may have a greater risk of getting burned. Raise (elevate) the affected area above the level of your heart while you are sitting or lying down. If told, wrap the  affected area with an elastic bandage. The bandage applies pressure (compression) to the area, which may help to reduce swelling and promote healing. Do not wrap the bandage too tightly around the affected area. If your hematoma is on a leg or foot (lower extremity) and is painful, your health care provider may recommend crutches. Use them as told by your health care provider. General instructions Take over-the-counter and prescription medicines only as  told by your health care provider. Keep all follow-up visits as told by your health care provider. This is important. Contact a health care provider if: You have a fever. The swelling or discoloration gets worse. You develop more hematomas. Get help right away if: Your pain is worse or your pain is not controlled with medicine. Your skin over the hematoma breaks or starts bleeding. Your hematoma is in your chest or abdomen and you have weakness, shortness of breath, or a change in consciousness. You have a hematoma on your scalp that is caused by a fall or injury, and you also have: A headache that gets worse. Trouble speaking or understanding speech. Weakness. Change in alertness or consciousness. Summary A hematoma is a collection of blood under the skin, in an organ, in a body space, in a joint space, or in other tissue. This condition usually does not need treatment because many hematomas go away on their own over time. Large hematomas, or those that may affect vital organs, may need surgical drainage or monitoring. If the hematoma is caused by a medical condition, medicines may be prescribed. Get help right away if your hematoma breaks or starts to bleed, you have shortness of breath, or you have a headache or trouble speaking after a fall. This information is not intended to replace advice given to you by your health care provider. Make sure you discuss any questions you have with your health care provider. Document Revised: 06/09/2018 Document Reviewed: 06/18/2017 Elsevier Patient Education  2022 [employer].

## 2020-11-27 NOTE — Telephone Encounter (Signed)
Pt would like to up the dosage of his furosemide (LASIX) 20 MG tablet to 40mg , he stated that he forgot to tell Alyse Low this at his appt. Aware that she may not be able to make the change. Please call back and advise.

## 2020-11-27 NOTE — Progress Notes (Signed)
Subjective:    Patient ID: Earl [name], male    DOB: 06-20-1960, 60 y.o.   MRN: [id number]  Chief Complaint  Patient presents with   Medical Management of Chronic Issues   Leg Injury    Left leg fell 2 weeks ago wants looked at.    Pt calls the office today for chronic follow up. He is followed by GI for alcoholic cirrhosis and malignant neoplasm of colon. He is followed by Pain Clinic once a month for chronic pain and neuropathy. He is followed by Cardiologists for NSVT and atrial flutter.    He states he is drinking one or two 24 oz a beer a day.   He reports he fell two weeks ago and hit his left lower leg. He reports swelling and bruising.   He is requesting his Testerone checked today. He is complaining of fatigue, increase depression, and loss of body hair.   Hyperlipidemia This is a chronic problem. The current episode started more than 1 year ago. The problem is uncontrolled. Pertinent negatives include no shortness of breath. Current antihyperlipidemic treatment includes diet change. The current treatment provides no improvement of lipids. Risk factors for coronary artery disease include dyslipidemia and male sex.  Gastroesophageal Reflux He complains of belching and heartburn. He reports no hoarse voice. This is a chronic problem. The current episode started more than 1 year ago. The problem occurs rarely. Pertinent negatives include no fatigue. Risk factors include obesity. He has tried a PPI for the symptoms.  Thyroid Problem Presents for follow-up visit. Patient reports no depressed mood, fatigue, hoarse voice or palpitations. The symptoms have been stable. His past medical history is significant for hyperlipidemia.  Depression        This is a chronic problem.  The current episode started more than 1 year ago.   Associated symptoms include irritable, decreased interest and sad.  Associated symptoms include no fatigue, no helplessness, no hopelessness and no restlessness.   Past treatments include SSRIs - Selective serotonin reuptake inhibitors.  Past medical history includes thyroid problem.   Hypertension This is a chronic problem. The current episode started more than 1 year ago. The problem has been resolved since onset. Associated symptoms include malaise/fatigue. Pertinent negatives include no palpitations or shortness of breath. Risk factors for coronary artery disease include dyslipidemia, obesity and male gender. The current treatment provides moderate improvement. Identifiable causes of hypertension include a thyroid problem.     Review of Systems  Constitutional:  Positive for malaise/fatigue. Negative for fatigue.  HENT:  Negative for hoarse voice.   Respiratory:  Negative for shortness of breath.   Cardiovascular:  Negative for palpitations.  Gastrointestinal:  Positive for heartburn.  Psychiatric/Behavioral:  Positive for depression.   All other systems reviewed and are negative.     Objective:   Physical Exam Vitals reviewed.  Constitutional:      General: He is irritable. He is not in acute distress.    Appearance: He is well-developed. He is obese.  HENT:     Head: Normocephalic.     Right Ear: Tympanic membrane normal.     Left Ear: Tympanic membrane normal.  Eyes:     General:        Right eye: No discharge.        Left eye: No discharge.     Pupils: Pupils are equal, round, and reactive to light.  Neck:     Thyroid: No thyromegaly.  Cardiovascular:  Rate and Rhythm: Normal rate and regular rhythm.     Heart sounds: Normal heart sounds. No murmur heard. Pulmonary:     Effort: Pulmonary effort is normal. No respiratory distress.     Breath sounds: Normal breath sounds. No wheezing.  Abdominal:     General: Bowel sounds are normal. There is no distension.     Palpations: Abdomen is soft.     Tenderness: There is no abdominal tenderness.  Musculoskeletal:        General: No tenderness. Normal range of motion.     Cervical  back: Normal range of motion and neck supple.  Skin:    General: Skin is warm and dry.     Coloration: Skin is pale.     Findings: No erythema or rash.          Comments: Hematoma on left lower leg  Neurological:     Mental Status: He is alert and oriented to person, place, and time.     Cranial Nerves: No cranial nerve deficit.     Deep Tendon Reflexes: Reflexes are normal and symmetric.  Psychiatric:        Behavior: Behavior normal.        Thought Content: Thought content normal.        Judgment: Judgment normal.      BP (!) 101/58   Pulse 91   Temp (!) 97.3 F (36.3 C)   Ht 5' 10"  (1.778 m)   Wt 260 lb 9.6 oz (118.2 kg)   BMI 37.39 kg/m      Assessment & Plan:  Earl [name] comes in today with chief complaint of Medical Management of Chronic Issues and Leg Injury (Left leg fell 2 weeks ago wants looked at.)   Diagnosis and orders addressed:  1. Atypical atrial flutter (HCC) - CBC with Differential/Platelet - BMP8+EGFR  2. Alcoholic cirrhosis of liver without ascites (HCC) - CBC with Differential/Platelet - BMP8+EGFR - Hepatic function panel  3. Subclinical hypothyroidism - CBC with Differential/Platelet - BMP8+EGFR - TSH  4. Neuropathy - CBC with Differential/Platelet - BMP8+EGFR  5. Mixed hyperlipidemia - CBC with Differential/Platelet - BMP8+EGFR  6. Morbid obesity (HCC) - CBC with Differential/Platelet - BMP8+EGFR  7. Depression, recurrent ([place]) - CBC with Differential/Platelet - BMP8+EGFR  8. ETOHism (HCC) - CBC with Differential/Platelet - BMP8+EGFR  9. Hematoma of left lower extremity, initial encounter - CBC with Differential/Platelet - BMP8+EGFR  10. Other fatigue - CBC with Differential/Platelet - BMP8+EGFR - Testosterone,Free and Total   Labs pending Health Maintenance reviewed Diet and exercise encouraged  Follow up plan: 3 months    Evelina Dun, FNP

## 2020-11-28 LAB — BMP8+EGFR
BUN/Creatinine Ratio: 7 — ABNORMAL LOW (ref 10–24)
BUN: 8 mg/dL (ref 8–27)
CO2: 22 mmol/L (ref 20–29)
Calcium: 9.1 mg/dL (ref 8.6–10.2)
Chloride: 101 mmol/L (ref 96–106)
Creatinine, Ser: 1.1 mg/dL (ref 0.76–1.27)
Glucose: 88 mg/dL (ref 70–99)
Potassium: 3.9 mmol/L (ref 3.5–5.2)
Sodium: 138 mmol/L (ref 134–144)
eGFR: 76 mL/min/{1.73_m2} (ref >59)

## 2020-11-28 LAB — CUP PACEART REMOTE DEVICE CHECK
Date Time Interrogation Session: 20221102012705
Implantable Pulse Generator Implant Date: 20201228

## 2020-11-28 LAB — CBC WITH DIFFERENTIAL/PLATELET
Basophils Absolute: 0.1 10*3/uL (ref 0.0–0.2)
Basos: 1 %
EOS (ABSOLUTE): 0.2 10*3/uL (ref 0.0–0.4)
Eos: 3 %
Hematocrit: 41.8 % (ref 37.5–51.0)
Hemoglobin: 14.9 g/dL (ref 13.0–17.7)
Immature Grans (Abs): 0 10*3/uL (ref 0.0–0.1)
Immature Granulocytes: 0 %
Lymphocytes Absolute: 2.4 10*3/uL (ref 0.7–3.1)
Lymphs: 30 %
MCH: 32.9 pg (ref 26.6–33.0)
MCHC: 35.6 g/dL (ref 31.5–35.7)
MCV: 92 fL (ref 79–97)
Monocytes Absolute: 0.8 10*3/uL (ref 0.1–0.9)
Monocytes: 10 %
Neutrophils Absolute: 4.4 10*3/uL (ref 1.4–7.0)
Neutrophils: 56 %
Platelets: 130 10*3/uL — ABNORMAL LOW (ref 150–450)
RBC: 4.53 x10E6/uL (ref 4.14–5.80)
RDW: 11.8 % (ref 11.6–15.4)
WBC: 7.9 10*3/uL (ref 3.4–10.8)

## 2020-11-28 LAB — HEPATIC FUNCTION PANEL
ALT: 45 IU/L — ABNORMAL HIGH (ref 0–44)
AST: 55 IU/L — ABNORMAL HIGH (ref 0–40)
Albumin: 4.2 g/dL (ref 3.8–4.8)
Alkaline Phosphatase: 107 IU/L (ref 44–121)
Bilirubin Total: 0.7 mg/dL (ref 0.0–1.2)
Bilirubin, Direct: 0.27 mg/dL (ref 0.00–0.40)
Total Protein: 7.4 g/dL (ref 6.0–8.5)

## 2020-11-28 LAB — TSH: TSH: 2.37 u[IU]/mL (ref 0.450–4.500)

## 2020-11-28 LAB — TESTOSTERONE,FREE AND TOTAL
Testosterone, Free: 6.6 pg/mL (ref 6.6–18.1)
Testosterone: 692 ng/dL (ref 264–916)

## 2020-11-29 MED ORDER — FUROSEMIDE 20 MG PO TABS: ORAL_TABLET | ORAL | 2 refills | Status: DC

## 2020-11-29 NOTE — Telephone Encounter (Signed)
He can increase lasix to 40 mg for 3 days then 20 mg daily.

## 2020-11-29 NOTE — Telephone Encounter (Signed)
Patient aware and verbalizes understanding. 

## 2020-11-30 DIAGNOSIS — H5213 Myopia, bilateral: Secondary | ICD-10-CM | POA: Diagnosis not present

## 2020-12-03 ENCOUNTER — Ambulatory Visit (INDEPENDENT_AMBULATORY_CARE_PROVIDER_SITE_OTHER): Payer: Medicaid Other

## 2020-12-03 DIAGNOSIS — I471 Supraventricular tachycardia, unspecified: Secondary | ICD-10-CM

## 2020-12-07 NOTE — Progress Notes (Signed)
Carelink Summary Report / Loop Recorder 

## 2020-12-24 ENCOUNTER — Encounter (INDEPENDENT_AMBULATORY_CARE_PROVIDER_SITE_OTHER): Payer: Self-pay | Admitting: *Deleted

## 2020-12-24 DIAGNOSIS — M25519 Pain in unspecified shoulder: Secondary | ICD-10-CM | POA: Diagnosis not present

## 2020-12-24 DIAGNOSIS — Z7689 Persons encountering health services in other specified circumstances: Secondary | ICD-10-CM | POA: Diagnosis not present

## 2020-12-24 DIAGNOSIS — R251 Tremor, unspecified: Secondary | ICD-10-CM | POA: Diagnosis not present

## 2020-12-24 DIAGNOSIS — M545 Low back pain, unspecified: Secondary | ICD-10-CM | POA: Diagnosis not present

## 2020-12-24 DIAGNOSIS — M542 Cervicalgia: Secondary | ICD-10-CM | POA: Diagnosis not present

## 2020-12-24 DIAGNOSIS — F419 Anxiety disorder, unspecified: Secondary | ICD-10-CM | POA: Diagnosis not present

## 2020-12-24 DIAGNOSIS — Z79891 Long term (current) use of opiate analgesic: Secondary | ICD-10-CM | POA: Diagnosis not present

## 2020-12-24 DIAGNOSIS — E559 Vitamin D deficiency, unspecified: Secondary | ICD-10-CM | POA: Diagnosis not present

## 2020-12-24 DIAGNOSIS — G609 Hereditary and idiopathic neuropathy, unspecified: Secondary | ICD-10-CM | POA: Diagnosis not present

## 2020-12-24 DIAGNOSIS — I10 Essential (primary) hypertension: Secondary | ICD-10-CM | POA: Diagnosis not present

## 2020-12-24 DIAGNOSIS — F331 Major depressive disorder, recurrent, moderate: Secondary | ICD-10-CM | POA: Diagnosis not present

## 2020-12-24 DIAGNOSIS — M5412 Radiculopathy, cervical region: Secondary | ICD-10-CM | POA: Diagnosis not present

## 2021-01-02 ENCOUNTER — Other Ambulatory Visit: Payer: Self-pay | Admitting: Family

## 2021-01-02 DIAGNOSIS — R188 Other ascites: Secondary | ICD-10-CM

## 2021-01-02 LAB — CUP PACEART REMOTE DEVICE CHECK
Date Time Interrogation Session: 20221205003023
Implantable Pulse Generator Implant Date: 20201228

## 2021-01-07 ENCOUNTER — Ambulatory Visit (INDEPENDENT_AMBULATORY_CARE_PROVIDER_SITE_OTHER): Payer: Medicaid Other

## 2021-01-07 DIAGNOSIS — I471 Supraventricular tachycardia: Secondary | ICD-10-CM | POA: Diagnosis not present

## 2021-01-15 NOTE — Progress Notes (Signed)
Carelink Summary Report / Loop Recorder 

## 2021-02-11 ENCOUNTER — Ambulatory Visit (INDEPENDENT_AMBULATORY_CARE_PROVIDER_SITE_OTHER): Payer: Medicaid Other

## 2021-02-11 DIAGNOSIS — I471 Supraventricular tachycardia, unspecified: Secondary | ICD-10-CM

## 2021-02-11 LAB — CUP PACEART REMOTE DEVICE CHECK
Date Time Interrogation Session: 20230115233040
Implantable Pulse Generator Implant Date: 20201228

## 2021-02-12 ENCOUNTER — Telehealth (INDEPENDENT_AMBULATORY_CARE_PROVIDER_SITE_OTHER): Payer: Self-pay | Admitting: Internal Medicine

## 2021-02-12 NOTE — Telephone Encounter (Signed)
Pt had EGD in June 2022 and was recommended to repeat EGD in 6 months. Pt is questioning why and what did Dr Laural Golden see in the last one to want him to repeat one so soon. Please advise. [phone]

## 2021-02-12 NOTE — Telephone Encounter (Signed)
Pt was following up on the paperwork he mailed in to schedule his EGD. Please advise and call him at [phone]

## 2021-02-12 NOTE — Telephone Encounter (Signed)
Spoke to patient, I didn't find his questionnaire, told him I could mailt o him again and he saud that was fine, EGD questionnaire mailed again

## 2021-02-13 NOTE — Telephone Encounter (Signed)
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 Pt is ok with scheduling EGD. States any day is good.

## 2021-02-21 NOTE — Progress Notes (Signed)
Carelink Summary Report / Loop Recorder 

## 2021-02-28 ENCOUNTER — Ambulatory Visit: Payer: Medicaid Other | Admitting: Family

## 2021-03-06 ENCOUNTER — Ambulatory Visit: Payer: Medicaid Other | Admitting: Family

## 2021-03-06 ENCOUNTER — Encounter: Payer: Self-pay | Admitting: Family

## 2021-03-06 VITALS — BP 136/77 | HR 86 | Temp 98.2°F | Ht 70.0 in | Wt 257.0 lb

## 2021-03-06 DIAGNOSIS — E538 Deficiency of other specified B group vitamins: Secondary | ICD-10-CM

## 2021-03-06 DIAGNOSIS — E559 Vitamin D deficiency, unspecified: Secondary | ICD-10-CM

## 2021-03-06 DIAGNOSIS — F339 Major depressive disorder, recurrent, unspecified: Secondary | ICD-10-CM

## 2021-03-06 DIAGNOSIS — I10 Essential (primary) hypertension: Secondary | ICD-10-CM | POA: Diagnosis not present

## 2021-03-06 DIAGNOSIS — K7030 Alcoholic cirrhosis of liver without ascites: Secondary | ICD-10-CM

## 2021-03-06 DIAGNOSIS — F1020 Alcohol dependence, uncomplicated: Secondary | ICD-10-CM

## 2021-03-06 DIAGNOSIS — E038 Other specified hypothyroidism: Secondary | ICD-10-CM | POA: Diagnosis not present

## 2021-03-06 DIAGNOSIS — I484 Atypical atrial flutter: Secondary | ICD-10-CM | POA: Diagnosis not present

## 2021-03-06 DIAGNOSIS — E782 Mixed hyperlipidemia: Secondary | ICD-10-CM

## 2021-03-06 MED ORDER — FUROSEMIDE 40 MG PO TABS: 40.0000 mg | ORAL_TABLET | Freq: Every day | ORAL | 3 refills | Status: DC

## 2021-03-06 NOTE — Patient Instructions (Signed)
Ascites  Ascites is a collection of too much fluid in the abdomen. Ascites can rangefrom mild to severe. If ascites is not treated, it can get worse. What are the causes? This condition may be caused by: A liver condition called cirrhosis. This is the most common cause of ascites. Long-term (chronic) or alcoholic hepatitis. Infection or inflammation in the abdomen. Cancer in the abdomen. Heart failure. Kidney disease. Inflammation of the pancreas. Clots in the veins of the liver. What are the signs or symptoms? Symptoms of this condition include: A feeling of fullness in the abdomen. This is common. An increase in the size of the abdomen or waist. Swelling in the legs. Swelling of the scrotum. Difficulty breathing. Pain in the abdomen. Sudden weight gain. If the condition is mild, you may not have symptoms. How is this diagnosed? This condition is diagnosed based on your medical history and a physical exam. Your health care provider may order imaging tests, such as an ultrasound or CTscan of your abdomen. How is this treated? Treatment for this condition depends on the cause of the ascites. It may include: Taking a pill to make you urinate. This is called a water pill (diuretic pill). Strictly reducing your salt (sodium) intake. Salt can cause extra fluid to be kept (retained) in the body, and this makes ascites worse. Having a procedure to remove fluid from your abdomen (paracentesis). Having a procedure that connects two of the major veins within your liver and relieves pressure on your liver. This is called a TIPS procedure (transjugular intrahepatic portosystemic shunt procedure). Placement of a drainage catheter (peritoneovenous shunt) to manage the extra fluid in the abdomen. Ascites may go away or improve when the condition that caused it is treated. Follow these instructions at home: Eating and drinking Keep track of your weight. To do this, weigh yourself at the same time  every day and write down your weight. Try not to eat salty (high-sodium) foods. Follow any instructions that your health care provider gives you about how much to drink. Keep track of how much you drink and any changes in how much or how often you urinate. General instructions Report any changes in your health to your health care provider, especially if you develop new symptoms or your symptoms get worse. Take over-the-counter and prescription medicines only as told by your health care provider. Keep all follow-up visits. This is important. Contact a health care provider if: You gain more than 3 lb (1.36 kg) in 3 days. Your waist size increases. You have new swelling in your legs. The swelling in your legs gets worse. Get help right away if: You have a fever or chills. You are confused. You have new or worsening breathing trouble. You have new or worsening pain in your abdomen. You have new or worsening swelling in the scrotum. Summary Ascites is a collection of too much fluid in the abdomen. Ascites may be caused by various conditions, such as cirrhosis, hepatitis, cancer, or congestive heart failure. Symptoms may include swelling of the abdomen and other areas due to extra fluid in the body. Treatments may involve dietary changes, medicines, or procedures. This information is not intended to replace advice given to you by your health care provider. Make sure you discuss any questions you have with your healthcare provider. Document Revised: 09/27/2019 Document Reviewed: 09/27/2019 Elsevier Patient Education  2022 [employer] Inc[employer]  

## 2021-03-06 NOTE — Progress Notes (Signed)
Subjective:    Patient ID: Earl [name], male    DOB: [birth year], 61 y.o.   MRN: [id number]  Chief Complaint  Patient presents with   Medical Management of Chronic Issues    Wants b12 and and vit D cecked. Also wants to up lasix to 57m    Pt presents to the office today for chronic follow up. He is followed by GI for alcoholic cirrhosis and malignant neoplasm of colon. He quit the [facility] Clinic once a month for chronic pain and neuropathy. He is followed by Cardiologists for NSVT and atrial flutter.    He states he is drinking one or two 24 oz a beer a day.   He is morbid obese with a BMI of 36 with hyperlipidemia and depression.   He is currently taking lasix 20 mg, but states he takes 40 mg as needed when he gains fluid.  Hypertension This is a chronic problem. The current episode started more than 1 year ago. The problem has been resolved since onset. The problem is controlled. Associated symptoms include malaise/fatigue. Pertinent negatives include no peripheral edema or shortness of breath. Risk factors for coronary artery disease include dyslipidemia, obesity and male gender. The current treatment provides moderate improvement. There is no history of heart failure. Identifiable causes of hypertension include a thyroid problem.  Thyroid Problem Presents for follow-up visit. Symptoms include dry skin and fatigue. Patient reports no constipation or diarrhea. The symptoms have been stable. His past medical history is significant for hyperlipidemia. There is no history of heart failure.  Hyperlipidemia This is a chronic problem. The current episode started more than 1 year ago. Exacerbating diseases include obesity. Pertinent negatives include no shortness of breath. Current antihyperlipidemic treatment includes diet change. The current treatment provides mild improvement of lipids. Risk factors for coronary artery disease include dyslipidemia, male sex, hypertension and a sedentary  lifestyle.  Depression        This is a chronic problem.  The current episode started more than 1 year ago.   Associated symptoms include fatigue, irritable, restlessness and sad.  Associated symptoms include no helplessness and no hopelessness.  Past treatments include SSRIs - Selective serotonin reuptake inhibitors.  Past medical history includes thyroid problem.   Gastroesophageal Reflux He complains of belching and heartburn. This is a chronic problem. The current episode started more than 1 year ago. The problem occurs occasionally. The problem has been waxing and waning. Associated symptoms include fatigue. He has tried a PPI for the symptoms. The treatment provided moderate relief.     Review of Systems  Constitutional:  Positive for fatigue and malaise/fatigue.  Respiratory:  Negative for shortness of breath.   Gastrointestinal:  Positive for heartburn. Negative for constipation and diarrhea.  Psychiatric/Behavioral:  Positive for depression.   All other systems reviewed and are negative.     Objective:   Physical Exam Vitals reviewed.  Constitutional:      General: He is irritable. He is not in acute distress.    Appearance: He is well-developed. He is obese.  HENT:     Head: Normocephalic.     Right Ear: Tympanic membrane normal.     Left Ear: Tympanic membrane normal.  Eyes:     General:        Right eye: No discharge.        Left eye: No discharge.     Pupils: Pupils are equal, round, and reactive to light.  Neck:     Thyroid:  No thyromegaly.  Cardiovascular:     Rate and Rhythm: Normal rate and regular rhythm.     Heart sounds: Normal heart sounds. No murmur heard. Pulmonary:     Effort: Pulmonary effort is normal. No respiratory distress.     Breath sounds: Normal breath sounds. No wheezing.  Abdominal:     General: Bowel sounds are normal. There is no distension.     Palpations: Abdomen is soft.     Tenderness: There is no abdominal tenderness.   Musculoskeletal:        General: No tenderness. Normal range of motion.     Cervical back: Normal range of motion and neck supple.  Skin:    General: Skin is warm and dry.     Findings: No erythema or rash.  Neurological:     Mental Status: He is alert and oriented to person, place, and time.     Cranial Nerves: No cranial nerve deficit.     Deep Tendon Reflexes: Reflexes are normal and symmetric.  Psychiatric:        Behavior: Behavior normal.        Thought Content: Thought content normal.        Judgment: Judgment normal.         BP 136/77    Pulse 86    Temp 98.2 F (36.8 C) (Temporal)    Ht 5' 10"  (1.778 m)    Wt 257 lb (116.6 kg)    BMI 36.88 kg/m   Assessment & Plan:  JAQUAE RIEVES comes in today with chief complaint of Medical Management of Chronic Issues (Wants b12 and and vit D cecked. Also wants to up lasix to 42m )   Diagnosis and orders addressed:  1. Essential hypertension - CMP14+EGFR - CBC with Differential/Platelet  2. Atypical atrial flutter (HCC) - CMP14+EGFR - CBC with Differential/Platelet  3. Subclinical hypothyroidism - CMP14+EGFR - CBC with Differential/Platelet - TSH  4. Alcoholic cirrhosis of liver without ascites (HCC) - furosemide (LASIX) 40 MG tablet; Take 1 tablet (40 mg total) by mouth daily.  Dispense: 30 tablet; Refill: 3 - CMP14+EGFR - CBC with Differential/Platelet  5. Depression, recurrent ([place]  - CMP14+EGFR - CBC with Differential/Platelet  6. ETOHism ([place] - CMP14+EGFR - CBC with Differential/Platelet  7. Mixed hyperlipidemia - CMP14+EGFR - CBC with Differential/Platelet  8. Morbid obesity ([place] - CMP14+EGFR - CBC with Differential/Platelet  9. Vitamin D deficiency - VITAMIN D 25 Hydroxy (Vit-D Deficiency, Fractures) - CMP14+EGFR - CBC with Differential/Platelet  10. Vitamin B 12 deficiency - Vitamin B12 - CMP14+EGFR - CBC with Differential/Platelet   Labs pending Health Maintenance reviewed Diet  and exercise encouraged  Follow up plan: 3 months   CEvelina Dun FNP

## 2021-03-07 LAB — CBC WITH DIFFERENTIAL/PLATELET

## 2021-03-08 ENCOUNTER — Telehealth: Payer: Self-pay | Admitting: Family

## 2021-03-08 LAB — CMP14+EGFR
ALT: 24 IU/L (ref 0–44)
AST: 32 IU/L (ref 0–40)
Albumin/Globulin Ratio: 1.3 (ref 1.2–2.2)
Albumin: 3.9 g/dL (ref 3.8–4.8)
Alkaline Phosphatase: 115 IU/L (ref 44–121)
BUN/Creatinine Ratio: 15 (ref 10–24)
BUN: 15 mg/dL (ref 8–27)
Bilirubin Total: 0.5 mg/dL (ref 0.0–1.2)
CO2: 22 mmol/L (ref 20–29)
Calcium: 9.2 mg/dL (ref 8.6–10.2)
Chloride: 102 mmol/L (ref 96–106)
Creatinine, Ser: 1.03 mg/dL (ref 0.76–1.27)
Globulin, Total: 2.9 g/dL (ref 1.5–4.5)
Glucose: 101 mg/dL — ABNORMAL HIGH (ref 70–99)
Potassium: 3.9 mmol/L (ref 3.5–5.2)
Sodium: 140 mmol/L (ref 134–144)
Total Protein: 6.8 g/dL (ref 6.0–8.5)
eGFR: 83 mL/min/{1.73_m2} (ref >59)

## 2021-03-08 LAB — CBC WITH DIFFERENTIAL/PLATELET
Basophils Absolute: 0 10*3/uL (ref 0.0–0.2)
Basos: 1 %
EOS (ABSOLUTE): 0.2 10*3/uL (ref 0.0–0.4)
Eos: 3 %
Hematocrit: 43 % (ref 37.5–51.0)
Hemoglobin: 14.6 g/dL (ref 13.0–17.7)
Immature Grans (Abs): 0 10*3/uL (ref 0.0–0.1)
Immature Granulocytes: 0 %
Lymphocytes Absolute: 1.9 10*3/uL (ref 0.7–3.1)
Lymphs: 26 %
MCH: 32.1 pg (ref 26.6–33.0)
MCHC: 34 g/dL (ref 31.5–35.7)
MCV: 95 fL (ref 79–97)
Monocytes Absolute: 0.8 10*3/uL (ref 0.1–0.9)
Monocytes: 11 %
Neutrophils Absolute: 4.3 10*3/uL (ref 1.4–7.0)
Neutrophils: 59 %
Platelets: 125 10*3/uL — ABNORMAL LOW (ref 150–450)
RBC: 4.55 x10E6/uL (ref 4.14–5.80)
RDW: 13.8 % (ref 11.6–15.4)
WBC: 7.3 10*3/uL (ref 3.4–10.8)

## 2021-03-08 LAB — VITAMIN D 25 HYDROXY (VIT D DEFICIENCY, FRACTURES): Vit D, 25-Hydroxy: 56.6 ng/mL (ref 30.0–100.0)

## 2021-03-08 LAB — VITAMIN B12: Vitamin B-12: 186 pg/mL — ABNORMAL LOW (ref 232–1245)

## 2021-03-08 LAB — TSH: TSH: 3.79 u[IU]/mL (ref 0.450–4.500)

## 2021-03-08 NOTE — Telephone Encounter (Signed)
PATIENT AWARE

## 2021-03-13 ENCOUNTER — Other Ambulatory Visit: Payer: Self-pay

## 2021-03-13 ENCOUNTER — Telehealth (INDEPENDENT_AMBULATORY_CARE_PROVIDER_SITE_OTHER): Payer: Self-pay | Admitting: *Deleted

## 2021-03-13 ENCOUNTER — Encounter (INDEPENDENT_AMBULATORY_CARE_PROVIDER_SITE_OTHER): Payer: Self-pay | Admitting: *Deleted

## 2021-03-13 ENCOUNTER — Telehealth: Payer: Self-pay | Admitting: Family

## 2021-03-13 MED ORDER — CYANOCOBALAMIN 1000 MCG/ML IJ SOLN: INTRAMUSCULAR | 21 refills | Status: DC

## 2021-03-13 MED ORDER — CYANOCOBALAMIN 1000 MCG/ML IJ SOLN: 1000.0000 ug | INTRAMUSCULAR | 11 refills | Status: DC

## 2021-03-13 NOTE — Telephone Encounter (Signed)
°  Prescription Request  03/13/2021  Is this a "Controlled Substance" medicine? no  Have you seen your PCP in the last 2 weeks? yes  If YES, route message to pool  -  If NO, patient needs to be scheduled for appointment.  What is the name of the medication or equipment? cyanocobalamin (,VITAMIN B-12,) 1000 MCG/ML injection  Have you contacted your pharmacy to request a refill? yes   Which pharmacy would you like this sent to? [facility]    Patient notified that their request is being sent to the clinical staff for review and that they should receive a response within 2 business days.

## 2021-03-13 NOTE — Telephone Encounter (Signed)
Referring MD/PCP: christy hawks  Procedure: egd  Reason/Indication:  esophageal varices  Has patient had this procedure before?  Yes, 06/2020  If so, when, by whom and where?    Is there a family history of colon cancer?  no  Who?  What age when diagnosed?    Is patient diabetic? If yes, Type 1 or Type 2   no      Does patient have prosthetic heart valve or mechanical valve?  no  Do you have a pacemaker/defibrillator?  no  Has patient ever had endocarditis/atrial fibrillation? no  Does patient use oxygen? no  Has patient had joint replacement within last 12 months?  no  Is patient constipated or do they take laxatives? no  Does patient have a history of alcohol/drug use? yes  Have you had a stroke/heart attack last 6 mths? no  Do you take medicine for weight loss?  no  For male patients,: have you had a hysterectomy                       are you post menopausal                       do you still have your menstrual cycle   Is patient on blood thinner such as Coumadin, Plavix and/or Aspirin? no  Medications: alprazolam 1 mg bid, Vit B12 every 30 days, diltiazem 300 mg daily, Vit D2 daily, furosemide 40 mg daily, levothyroxine 50 mcg daily, ondansetron 4 mg prn, pantoprazole 40 mg daily, lyrica 200 mg tid, sertraline 50 mg daily, spironolactone 50 mg daily  Allergies: see epic  Medication Adjustment per Dr Rehman/Dr Jenetta Downer   Procedure date & time: 04/11/21

## 2021-03-14 ENCOUNTER — Telehealth (INDEPENDENT_AMBULATORY_CARE_PROVIDER_SITE_OTHER): Payer: Self-pay | Admitting: *Deleted

## 2021-03-14 ENCOUNTER — Other Ambulatory Visit (INDEPENDENT_AMBULATORY_CARE_PROVIDER_SITE_OTHER): Payer: Self-pay

## 2021-03-14 DIAGNOSIS — I8500 Esophageal varices without bleeding: Secondary | ICD-10-CM

## 2021-03-14 NOTE — Telephone Encounter (Signed)
Referring MD/PCP: hawks  Procedure: egd  Reason/Indication:  esophageal varices  Has patient had this procedure before?  Yes, 06/2020  If so, when, by whom and where?    Is there a family history of colon cancer?  no  Who?  What age when diagnosed?    Is patient diabetic? If yes, Type 1 or Type 2   no      Does patient have prosthetic heart valve or mechanical valve?  no  Do you have a pacemaker/defibrillator?  no  Has patient ever had endocarditis/atrial fibrillation? no  Does patient use oxygen? no  Has patient had joint replacement within last 12 months?  no  Is patient constipated or do they take laxatives? no  Does patient have a history of alcohol/drug use?  yes  Have you had a stroke/heart attack last 6 mths? no  Do you take medicine for weight loss?  no  For male patients,: have you had a hysterectomy                       are you post menopausal                       do you still have your menstrual cycle   Is patient on blood thinner such as Coumadin, Plavix and/or Aspirin? no  Medications: alprazolam 1 mg bid, vit b12 every 30 days, diltiazem 300 mg daily, furosemide 40 mg daily, levothyroxine 50 mcg daily, ondansetron 4 mg prn, pantoprazole 40 mg daily, lyrica 200 mg tid, setraline 50 mg daily, spironolactone 50 mg daily  Allergies: see epic  Medication Adjustment per Dr Rehman/Dr Jenetta Downer   Procedure date & time: 04/11/21

## 2021-03-17 LAB — CUP PACEART REMOTE DEVICE CHECK
Date Time Interrogation Session: 20230217233026
Implantable Pulse Generator Implant Date: 20201228

## 2021-03-18 ENCOUNTER — Ambulatory Visit (INDEPENDENT_AMBULATORY_CARE_PROVIDER_SITE_OTHER): Payer: Medicaid Other

## 2021-03-18 DIAGNOSIS — I471 Supraventricular tachycardia: Secondary | ICD-10-CM | POA: Diagnosis not present

## 2021-03-22 NOTE — Progress Notes (Signed)
Carelink Summary Report / Loop Recorder 

## 2021-04-03 ENCOUNTER — Encounter (INDEPENDENT_AMBULATORY_CARE_PROVIDER_SITE_OTHER): Payer: Self-pay | Admitting: Internal Medicine

## 2021-04-03 ENCOUNTER — Telehealth (INDEPENDENT_AMBULATORY_CARE_PROVIDER_SITE_OTHER): Payer: Self-pay | Admitting: *Deleted

## 2021-04-03 NOTE — Telephone Encounter (Signed)
Patient called office and left this message ?"I tell you what my name is Earl [name] my birthday is [id number] if you don't get back to me the day of the procedure that I've got scheduled that's going to happen and I can talk to Dr Laural Golden that day I'm going to lay it out for him I'm going to lay it all out for him if you don't get back to me and you need to do that you need to get back to me when you get this straight before it happens you got me my number is [phone] I had enough okay I had enough call me back and I mean now" ? ?As best we can figure, the calls patient is receiving could be from automated system for reminder of pre op at [facility] scheduled 04/08/21, pre-service center or the pharmacy to verify medications.  ?

## 2021-04-03 NOTE — Patient Instructions (Signed)
? ? ? ? ? ? Earl [name] ? 04/03/2021  ?  ? '@PREFPERIOPPHARMACY'$ @ ? ? Your procedure is scheduled on  04/11/2021. ? ? Report to [name] at  1130  A.M. ? ? Call this number if you have problems the morning of surgery: ? [phone] ? ? Remember: ? Follow the diet instructions given to you by the office. ? ?  ? Take these medicines the morning of surgery with A SIP OF WATER  ? ?           Xanax(if needed), cardiazem, lyrica, mirapex. ?  ? ? Do not wear jewelry, make-up or nail polish. ? Do not wear lotions, powders, or perfumes, or deodorant. ? Do not shave 48 hours prior to surgery.  Men may shave face and neck. ? Do not bring valuables to the hospital. ? [facility] is not responsible for any belongings or valuables. ? ?Contacts, dentures or bridgework may not be worn into surgery.  Leave your suitcase in the car.  After surgery it may be brought to your room. ? ?For patients admitted to the hospital, discharge time will be determined by your treatment team. ? ?Patients discharged the day of surgery will not be allowed to drive home and must have someone with them for 24 hours.  ? ? ?Special instructions:   DO NOT smoke tobacco or vape for 24 hours before your procedure. ? ?Please read over the following fact sheets that you were given. ?Anesthesia Post-op Instructions and Care and Recovery After Surgery ?  ? ? ? Upper Endoscopy, Adult, Care After ?This sheet gives you information about how to care for yourself after your procedure. Your health care provider may also give you more specific instructions. If you have problems or questions, contact your health care provider. ?What can I expect after the procedure? ?After the procedure, it is common to have: ?A sore throat. ?Mild stomach pain or discomfort. ?Bloating. ?Nausea. ?Follow these instructions at home: ? ?Follow instructions from your health care provider about what to eat or drink after your procedure. ?Return to your normal activities as told by your  health care provider. Ask your health care provider what activities are safe for you. ?Take over-the-counter and prescription medicines only as told by your health care provider. ?If you were given a sedative during the procedure, it can affect you for several hours. Do not drive or operate machinery until your health care provider says that it is safe. ?Keep all follow-up visits as told by your health care provider. This is important. ?Contact a health care provider if you have: ?A sore throat that lasts longer than one day. ?Trouble swallowing. ?Get help right away if: ?You vomit blood or your vomit looks like coffee grounds. ?You have: ?A fever. ?Bloody, black, or tarry stools. ?A severe sore throat or you cannot swallow. ?Difficulty breathing. ?Severe pain in your chest or abdomen. ?Summary ?After the procedure, it is common to have a sore throat, mild stomach discomfort, bloating, and nausea. ?If you were given a sedative during the procedure, it can affect you for several hours. Do not drive or operate machinery until your health care provider says that it is safe. ?Follow instructions from your health care provider about what to eat or drink after your procedure. ?Return to your normal activities as told by your health care provider. ?This information is not intended to replace advice given to you by your health care provider. Make sure you discuss any questions you  have with your health care provider. ?Document Revised: 11/19/2018 Document Reviewed: 06/15/2017 ?Elsevier Patient Education ? [place]. ?Monitored Anesthesia Care, Care After ?This sheet gives you information about how to care for yourself after your procedure. Your health care provider may also give you more specific instructions. If you have problems or questions, contact your health care provider. ?What can I expect after the procedure? ?After the procedure, it is common to have: ?Tiredness. ?Forgetfulness about what happened after  the procedure. ?Impaired judgment for important decisions. ?Nausea or vomiting. ?Some difficulty with balance. ?Follow these instructions at home: ?For the time period you were told by your health care provider: ?  ?Rest as needed. ?Do not participate in activities where you could fall or become injured. ?Do not drive or use machinery. ?Do not drink alcohol. ?Do not take sleeping pills or medicines that cause drowsiness. ?Do not make important decisions or sign legal documents. ?Do not take care of children on your own. ?Eating and drinking ?Follow the diet that is recommended by your health care provider. ?Drink enough fluid to keep your urine pale yellow. ?If you vomit: ?Drink water, juice, or soup when you can drink without vomiting. ?Make sure you have little or no nausea before eating solid foods. ?General instructions ?Have a responsible adult stay with you for the time you are told. It is important to have someone help care for you until you are awake and alert. ?Take over-the-counter and prescription medicines only as told by your health care provider. ?If you have sleep apnea, surgery and certain medicines can increase your risk for breathing problems. Follow instructions from your health care provider about wearing your sleep device: ?Anytime you are sleeping, including during daytime naps. ?While taking prescription pain medicines, sleeping medicines, or medicines that make you drowsy. ?Avoid smoking. ?Keep all follow-up visits as told by your health care provider. This is important. ?Contact a health care provider if: ?You keep feeling nauseous or you keep vomiting. ?You feel light-headed. ?You are still sleepy or having trouble with balance after 24 hours. ?You develop a rash. ?You have a fever. ?You have redness or swelling around the IV site. ?Get help right away if: ?You have trouble breathing. ?You have new-onset confusion at home. ?Summary ?For several hours after your procedure, you may feel  tired. You may also be forgetful and have poor judgment. ?Have a responsible adult stay with you for the time you are told. It is important to have someone help care for you until you are awake and alert. ?Rest as told. Do not drive or operate machinery. Do not drink alcohol or take sleeping pills. ?Get help right away if you have trouble breathing, or if you suddenly become confused. ?This information is not intended to replace advice given to you by your health care provider. Make sure you discuss any questions you have with your health care provider. ?Document Revised: 09/29/2019 Document Reviewed: 12/16/2018 ?Elsevier Patient Education ? [place]. ? ?

## 2021-04-04 NOTE — Telephone Encounter (Signed)
Thanks for the update

## 2021-04-05 NOTE — Telephone Encounter (Signed)
Patient called on 04/04/21 and left following message: "This is Earl [name] I think we probably would be better I mean Earl [name] this is Earl [name] I think we would be better to stop this right now and and I'll get back in touch with you in a month or two maybe and reschedule this thing in a month or two this has been such a hassle and such a drag I don't think I want to come over there you know with all the stuff on my mind you know I'd rather just put it off and let both sides kind of rethink this thing and let me cool down some and you know we'll do this in another month or two I'll call you when I feel like I'm ready to do it and I need you to get back to me and confirm this cuz I don't want to be charged $300 for no show and I don't want to go I don't want any trouble you know I certainly don't want more trouble than I've had already you know I want you to get I want you to get back with me and and you know confirm this and you know it's it's 3:49 a.m. so it's it's before 4:30 I think we need to just just put this off for right now for a month or two it's not a month or two it's not going to make the difference you know just give me a call back." ? ?I called patient back on 4:57 to let him know we had received his message and we would cancel if that is what he wanted to do. He proceeded to tell me to leave it as it was and he would be there next and he was sorry he had bothered [place].  ? ?

## 2021-04-08 ENCOUNTER — Encounter (HOSPITAL_COMMUNITY)
Admission: RE | Admit: 2021-04-08 | Discharge: 2021-04-08 | Disposition: A | Discharge status: Home / Self Care | Payer: Medicaid Other | Source: Ambulatory Visit | Attending: Internal Medicine | Admitting: Internal Medicine

## 2021-04-08 ENCOUNTER — Encounter (HOSPITAL_COMMUNITY): Payer: Self-pay

## 2021-04-08 HISTORY — DX: Anemia, unspecified: D64.9

## 2021-04-08 HISTORY — DX: Personal history of urinary calculi: Z87.442

## 2021-04-11 ENCOUNTER — Ambulatory Visit (HOSPITAL_COMMUNITY): Payer: Medicaid Other | Admitting: Anesthesiology

## 2021-04-11 ENCOUNTER — Encounter (HOSPITAL_COMMUNITY)
Admission: RE | Disposition: A | Discharge status: Home / Self Care | Payer: Self-pay | Source: Home / Self Care | Attending: Internal Medicine

## 2021-04-11 ENCOUNTER — Ambulatory Visit (HOSPITAL_BASED_OUTPATIENT_CLINIC_OR_DEPARTMENT_OTHER): Payer: Medicaid Other | Admitting: Anesthesiology

## 2021-04-11 ENCOUNTER — Encounter (HOSPITAL_COMMUNITY): Payer: Self-pay | Admitting: Internal Medicine

## 2021-04-11 ENCOUNTER — Ambulatory Visit (HOSPITAL_COMMUNITY)
Admission: RE | Admit: 2021-04-11 | Discharge: 2021-04-11 | Disposition: A | Discharge status: Home / Self Care | Payer: Medicaid Other | Attending: Internal Medicine | Admitting: Internal Medicine

## 2021-04-11 DIAGNOSIS — K766 Portal hypertension: Secondary | ICD-10-CM

## 2021-04-11 DIAGNOSIS — K2289 Other specified disease of esophagus: Secondary | ICD-10-CM | POA: Diagnosis not present

## 2021-04-11 DIAGNOSIS — K7030 Alcoholic cirrhosis of liver without ascites: Secondary | ICD-10-CM

## 2021-04-11 DIAGNOSIS — I10 Essential (primary) hypertension: Secondary | ICD-10-CM | POA: Insufficient documentation

## 2021-04-11 DIAGNOSIS — Z09 Encounter for follow-up examination after completed treatment for conditions other than malignant neoplasm: Secondary | ICD-10-CM | POA: Insufficient documentation

## 2021-04-11 DIAGNOSIS — I8510 Secondary esophageal varices without bleeding: Secondary | ICD-10-CM | POA: Diagnosis not present

## 2021-04-11 DIAGNOSIS — D759 Disease of blood and blood-forming organs, unspecified: Secondary | ICD-10-CM | POA: Insufficient documentation

## 2021-04-11 DIAGNOSIS — I8500 Esophageal varices without bleeding: Secondary | ICD-10-CM | POA: Insufficient documentation

## 2021-04-11 DIAGNOSIS — K7460 Unspecified cirrhosis of liver: Secondary | ICD-10-CM | POA: Insufficient documentation

## 2021-04-11 DIAGNOSIS — K317 Polyp of stomach and duodenum: Secondary | ICD-10-CM

## 2021-04-11 DIAGNOSIS — Z87891 Personal history of nicotine dependence: Secondary | ICD-10-CM | POA: Diagnosis not present

## 2021-04-11 DIAGNOSIS — D649 Anemia, unspecified: Secondary | ICD-10-CM | POA: Insufficient documentation

## 2021-04-11 DIAGNOSIS — K3189 Other diseases of stomach and duodenum: Secondary | ICD-10-CM

## 2021-04-11 DIAGNOSIS — F419 Anxiety disorder, unspecified: Secondary | ICD-10-CM | POA: Insufficient documentation

## 2021-04-11 HISTORY — PX: ESOPHAGOGASTRODUODENOSCOPY (EGD) WITH PROPOFOL: SHX5813

## 2021-04-11 SURGERY — ESOPHAGOGASTRODUODENOSCOPY (EGD) WITH PROPOFOL
Anesthesia: General

## 2021-04-11 MED ORDER — LACTATED RINGERS IV SOLN: INTRAVENOUS | Status: DC | PRN

## 2021-04-11 MED ORDER — LIDOCAINE HCL (CARDIAC) PF 100 MG/5ML IV SOSY
PREFILLED_SYRINGE | INTRAVENOUS | Status: DC | PRN
  Administered 2021-04-11: 60 mg via INTRATRACHEAL

## 2021-04-11 MED ORDER — PROPOFOL 10 MG/ML IV BOLUS
INTRAVENOUS | Status: DC | PRN
  Administered 2021-04-11: 50 mg via INTRAVENOUS
  Administered 2021-04-11: 100 mg via INTRAVENOUS

## 2021-04-11 MED ORDER — LACTATED RINGERS IV SOLN
INTRAVENOUS | Status: DC
  Administered 2021-04-11: 1000 mL via INTRAVENOUS

## 2021-04-11 MED ORDER — STERILE WATER FOR IRRIGATION IR SOLN
Status: DC | PRN
  Administered 2021-04-11: 60 mL

## 2021-04-11 NOTE — Anesthesia Preprocedure Evaluation (Signed)
Anesthesia Evaluation  ?Patient identified by MRN, date of birth, ID band ?Patient awake ? ? ? ?Reviewed: ?Allergy & Precautions, H&P , NPO status , Patient's Chart, lab work & pertinent test results, reviewed documented beta blocker date and time  ? ?Airway ?Mallampati: II ? ?TM Distance: >3 FB ?Neck ROM: full ? ? ? Dental ?no notable dental hx. ? ?  ?Pulmonary ?neg pulmonary ROS, former smoker,  ?  ?Pulmonary exam normal ?breath sounds clear to auscultation ? ? ? ? ? ? Cardiovascular ?Exercise Tolerance: Good ?hypertension, negative cardio ROS ? ? ?Rhythm:regular Rate:Normal ? ? ?  ?Neuro/Psych ?PSYCHIATRIC DISORDERS Anxiety Depression negative neurological ROS ?   ? GI/Hepatic ?negative GI ROS, Neg liver ROS,   ?Endo/Other  ?Hypothyroidism  ? Renal/GU ?negative Renal ROS  ?negative genitourinary ?  ?Musculoskeletal ? ? Abdominal ?  ?Peds ? Hematology ? ?(+) Blood dyscrasia, anemia ,   ?Anesthesia Other Findings ? ? Reproductive/Obstetrics ?negative OB ROS ? ?  ? ? ? ? ? ? ? ? ? ? ? ? ? ?  ?  ? ? ? ? ? ? ? ? ?Anesthesia Physical ?Anesthesia Plan ? ?ASA: 3 ? ?Anesthesia Plan: General  ? ?Post-op Pain Management:   ? ?Induction:  ? ?PONV Risk Score and Plan: Propofol infusion ? ?Airway Management Planned:  ? ?Additional Equipment:  ? ?Intra-op Plan:  ? ?Post-operative Plan:  ? ?Informed Consent: I have reviewed the patients History and Physical, chart, labs and discussed the procedure including the risks, benefits and alternatives for the proposed anesthesia with the patient or authorized representative who has indicated his/her understanding and acceptance.  ? ? ? ?Dental Advisory Given ? ?Plan Discussed with: CRNA ? ?Anesthesia Plan Comments:   ? ? ? ? ? ? ?Anesthesia Quick Evaluation ? ?

## 2021-04-11 NOTE — Discharge Instructions (Addendum)
Resume usual medications and diet as before. ?No driving for 24 hours. ?Office visit in 6 months. ?Next esophagogastroduodenoscopy in 1 year. ?

## 2021-04-11 NOTE — H&P (Signed)
Earl [name] is an 61 y.o. male.   ?Chief Complaint: Patient is here for esophagogastroduodenoscopy with possible esophageal variceal banding. ?HPI: Patient is 61 year old Caucasian male with cirrhosis secondary to [facility] and possibly NASH who underwent esophageal variceal banding for primary prophylaxis in June 2022.  At that time he also had ascites.  His hepatic function is improved since he has quit drinking.  He had CT scan in December 2022 and ascites had resolved.  He is undergoing EGD to reassess for varices and pandemic they have recurred.  He denies nausea vomiting heartburn dysphagia abdominal pain or melena.  He states he has gained weight since he was begun on vitamin B12 and his appetite has improved a great deal.  He says he has not had any alcohol in several months. ? ?Past Medical History:  ?Diagnosis Date  ? Alcohol abuse   ? Anemia   ? Anxiety   ? Arthritis   ? Colon cancer ([place])   ? Depression   ? Dysrhythmia   ? Gall stones   ? Heart burn   ? History of kidney stones   ? Hyperlipidemia   ? Hypertension   ? Hypothyroidism   ? Liver disease   ? Tachycardia   ? Typical atrial flutter ([place])   ? ? ?Past Surgical History:  ?Procedure Laterality Date  ? A-FLUTTER ABLATION N/A 07/31/2016  ? Procedure: A-Flutter Ablation;  Surgeon: [name], MD;  Location: [facility];  Service: Cardiovascular;  Laterality: N/A;  ? COLONOSCOPY N/A 04/17/2016  ? Procedure: COLONOSCOPY;  Surgeon: [name], MD;  [facility];  Service: Endoscopy;  Laterality: N/A;  1200  ? COLONOSCOPY N/A 05/07/2017  ? Procedure: COLONOSCOPY;  Surgeon: [name], MD;  [facility];  Service: Endoscopy;  Laterality: N/A;  200  ? COLONOSCOPY WITH PROPOFOL N/A 07/25/2020  ? Procedure: COLONOSCOPY WITH PROPOFOL;  Surgeon: [name], MD;  [facility];  Service: Endoscopy;  Laterality: N/A;  12:10  ? ESOPHAGEAL BANDING  07/25/2020  ? Procedure: ESOPHAGEAL BANDING;  Surgeon: [name], MD;  [facility];  Service: Endoscopy;;  ? ESOPHAGOGASTRODUODENOSCOPY (EGD) WITH PROPOFOL N/A 07/25/2020  ? Procedure: ESOPHAGOGASTRODUODENOSCOPY (EGD) WITH PROPOFOL;  Surgeon: [name], MD;  [facility];  Service: Endoscopy;  Laterality: N/A;  ? implantable loop recorder placement  01/24/2019  ?  Medtronic Reveal Linq model [id number] implantable loop recorder (SN [id number] ) by Dr Rayann Heman for evaluation of palpitations  ? MINOR CARPAL TUNNEL    ? right  ? POLYPECTOMY  04/17/2016  ? Procedure: POLYPECTOMY;  Surgeon: [name], MD;  [facility];  Service: Endoscopy;;  sigmoid  ? POLYPECTOMY  07/25/2020  ? Procedure: POLYPECTOMY;  Surgeon: [name], MD;  [facility];  Service: Endoscopy;;  ? ? ?Family History  ?Problem Relation Age of Onset  ? Alzheimer's disease Mother   ? Heart disease Father   ? Coronary artery disease Father   ? Liver disease Father   ? Thyroid disease Father   ? Hyperlipidemia Father   ? Heart attack Father   ? Coronary artery disease Paternal Grandfather   ? ?Social History:  reports that he quit smoking about 42 years ago. His smoking use included cigarettes. He has never used smokeless tobacco. He reports current alcohol use of about 8.0 standard drinks per week. He reports that he does not use drugs. ? ?Allergies:  ?  Allergies  ?Allergen Reactions  ? Amitriptyline Anaphylaxis  ? Amoxicillin Anaphylaxis and Rash  ?  Has patient had a PCN reaction causing immediate rash, facial/tongue/throat swelling, SOB or lightheadedness with hypotension: Yes ?Has patient had a PCN reaction causing severe rash involving mucus membranes or skin necrosis: No ?Has patient had a PCN reaction that required hospitalization: No ?Has patient had a PCN reaction occurring within the last 10 years: Yes ?If all of the above answers are "NO", then may proceed with Cephalosporin use. ?  ? Asa [Aspirin] Anaphylaxis  ? Penicillin G Anaphylaxis  ?  Clonidine Derivatives Other (See Comments)  ?  Severe Dry Mouth  ? Hydrochlorothiazide   ?  Caused rectal bleeding and hematuria  ? Levothyroxine Nausea And Vomiting  ?  Patient states that the 75 mcg is too strong for him.  ? Trintellix [Vortioxetine] Nausea And Vomiting  ? ? ?Medications Prior to Admission  ?Medication Sig Dispense Refill  ? ALPRAZolam (XANAX) 1 MG tablet Take 1 mg by mouth daily as needed for anxiety.    ? cyanocobalamin (,VITAMIN B-12,) 1000 MCG/ML injection Inject 1 ml into the muscle once a day for 7 days, then inject 1 ml into muscle once a week for 4 weeks, then inject 1 ml into muscle monthly. 1 mL 21  ? diltiazem (CARDIZEM CD) 300 MG 24 hr capsule TAKE ONE CAPSULE BY MOUTH DAILY (patient must have appointment FOR future refills) 30 capsule 6  ? ergocalciferol (VITAMIN D2) 1.25 MG (50000 UT) capsule Take 50,000 Units by mouth every 30 (thirty) days.    ? furosemide (LASIX) 40 MG tablet Take 1 tablet (40 mg total) by mouth daily. (Patient taking differently: Take 40 mg by mouth daily as needed for fluid or edema.) 30 tablet 3  ? LYRICA 200 MG capsule Take 600 mg by mouth daily.  2  ? pramipexole (MIRAPEX) 0.25 MG tablet Take 0.25 mg by mouth daily.    ? levothyroxine (SYNTHROID) 50 MCG tablet Take 1 tablet (50 mcg total) by mouth daily before breakfast. (Patient not taking: Reported on 04/02/2021) 90 tablet 1  ? ondansetron (ZOFRAN) 4 MG tablet TAKE 1 TABLET BY MOUTH TWICE DAILY AS NEEDED FOR NAUSEA OR FOR VOMITING (Patient not taking: Reported on 04/02/2021) 30 tablet 1  ? pantoprazole (PROTONIX) 40 MG tablet TAKE 1 TABLET BY MOUTH DAILY BEFORE breakfast (Patient not taking: Reported on 04/02/2021) 90 tablet 3  ? polyethylene glycol-electrolytes (TRILYTE) 420 g solution Take 4,000 mLs by mouth as directed. (Patient not taking: Reported on 03/06/2021) 4000 mL 0  ? spironolactone (ALDACTONE) 50 MG tablet TAKE 1 TABLET BY MOUTH EVERY DAY (Patient not taking: Reported on 03/06/2021) 30 tablet 2  ? ? ?No  results found for this or any previous visit (from the past 48 hour(s)). ?No results found. ? ?Review of Systems ? ?Blood pressure 136/81, pulse 70, temperature 98.9 ?F (37.2 ?C), temperature source Oral, resp. rate 14, height '5\' 10"'$  (1.778 m), weight 113.4 kg, SpO2 94 %. ?Physical Exam ?Constitutional:   ?   Comments: Asterixis absent  ?HENT:  ?   Mouth/Throat:  ?   Mouth: Mucous membranes are moist.  ?   Pharynx: Oropharynx is clear.  ?Eyes:  ?   General: No scleral icterus. ?   Conjunctiva/sclera: Conjunctivae normal.  ?Cardiovascular:  ?   Rate and Rhythm: Normal rate and regular rhythm.  ?   Heart sounds: Normal heart sounds. No murmur heard. ?Pulmonary:  ?   Effort: Pulmonary effort  is normal.  ?   Breath sounds: Normal breath sounds.  ?Abdominal:  ?   General: There is no distension.  ?   Palpations: Abdomen is soft. There is no mass.  ?   Tenderness: There is no abdominal tenderness.  ?Musculoskeletal:     ?   General: No swelling.  ?   Cervical back: Neck supple.  ?Lymphadenopathy:  ?   Cervical: No cervical adenopathy.  ?Skin: ?   General: Skin is warm and dry.  ?Neurological:  ?   Mental Status: He is alert.  ?  ? ?Assessment/Plan ? 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 ?Esophagogastroduodenoscopy with esophageal variceal banding if varices have recurred. ? ?Hildred Laser, MD ?04/11/2021, 12:37 PM ? ? ? ?

## 2021-04-11 NOTE — Op Note (Signed)
[facility] ?Patient Name: Earl [name] ?Procedure Date: 04/11/2021 12:29 PM ?MRN: [id number] ?Date of Birth: Jun 10, 1960 ?Attending MD: Hildred Laser , MD ?CSN: [id number] ?Age: 61 ?Admit Type: Outpatient ?Procedure:                Upper GI endoscopy ?Indications:              Esophageal varices, Follow-up of esophageal  ?                          varices, For therapy of esophageal varices ?Providers:                Hildred Laser, MD, Hughie Closs RN, RN, Janett Billow  ?                          [place] ?Referring MD:             Evelina Dun, FNP ?Medicines:                Propofol per Anesthesia ?Complications:            No immediate complications. ?Estimated Blood Loss:     Estimated blood loss: none. ?Procedure:                Pre-Anesthesia Assessment: ?                          - Prior to the procedure, a History and Physical  ?                          was performed, and patient medications and  ?                          allergies were reviewed. [name] tolerance of  ?                          previous anesthesia was also reviewed. The risks  ?                          and benefits of the procedure and the sedation  ?                          options and risks were discussed with the patient.  ?                          All questions were answered, and informed consent  ?                          was obtained. Prior Anticoagulants: The patient has  ?                          taken no previous anticoagulant or antiplatelet  ?                          agents. ASA Grade Assessment: III - A patient with  ?  severe systemic disease. After reviewing the risks  ?                          and benefits, the patient was deemed in  ?                          satisfactory condition to undergo the procedure. ?                          After obtaining informed consent, the endoscope was  ?                          passed under direct vision. Throughout the  ?                           procedure, the patient's blood pressure, pulse, and  ?                          oxygen saturations were monitored continuously. The  ?                          GIF-[id number] ([id number]) scope was introduced through the  ?                          mouth, and advanced to the second part of duodenum.  ?                          The upper GI endoscopy was accomplished without  ?                          difficulty. The patient tolerated the procedure  ?                          well. ?Scope In: 12:45:33 PM ?Scope Out: 12:52:20 PM ?Total Procedure Duration: 0 hours 6 minutes 47 seconds  ?Findings: ?     The hypopharynx was normal. ?     The proximal esophagus and mid esophagus were normal. ?     A post variceal banding scar was found in the distal esophagus. ?     Grade I varices were found in the distal esophagus. ?     The Z-line was irregular and was found 40 cm from the incisors. ?     Mild portal hypertensive gastropathy was found in the entire examined  ?     stomach. ?     A single small sessile polyp with no stigmata of recent bleeding was  ?     found in the gastric fundus. ?     The duodenal bulb and second portion of the duodenum were normal. ?Impression:               - Normal hypopharynx. ?                          - Normal proximal esophagus and mid esophagus. ?                          -  Scar in the distal esophagus. ?                          - Grade I esophageal varices. ?                          - Z-line irregular, 40 cm from the incisors. ?                          - Portal hypertensive gastropathy. ?                          - A single gastric polyp. Appearance suggestive of  ?                          hyperplastic polyp. It was left alone. ?                          - Normal duodenal bulb and second portion of the  ?                          duodenum. ?                          - No specimens collected. ?Moderate Sedation: ?     Per Anesthesia Care ?Recommendation:           - Patient has a contact number  available for  ?                          emergencies. The signs and symptoms of potential  ?                          delayed complications were discussed with the  ?                          patient. Return to normal activities tomorrow.  ?                          Written discharge instructions were provided to the  ?                          patient. ?                          - Resume previous diet today. ?                          - Continue present medications. ?                          - Repeat upper endoscopy in 1 year. ?                          - Return to GI clinic in 6 weeks. ?Procedure Code(s):        --- Professional --- ?  01779, Esophagogastroduodenoscopy, flexible,  ?                          transoral; diagnostic, including collection of  ?                          specimen(s) by brushing or washing, when performed  ?                          (separate procedure) ?Diagnosis Code(s):        --- Professional --- ?                          K22.8, Other specified diseases of esophagus ?                          I85.00, Esophageal varices without bleeding ?                          K76.6, Portal hypertension ?                          K31.89, Other diseases of stomach and duodenum ?                          K31.7, Polyp of stomach and duodenum ?CPT copyright 2019 American Medical Association. All rights reserved. ?The codes documented in this report are preliminary and upon coder review may  ?be revised to meet current compliance requirements. ?Hildred Laser, MD ?Hildred Laser, MD ?04/11/2021 1:06:05 PM ?This report has been signed electronically. ?Number of Addenda: 0 ?

## 2021-04-11 NOTE — Anesthesia Postprocedure Evaluation (Signed)
Anesthesia Post Note ? ?Patient: SANTANNA OLENIK ? ?Procedure(s) Performed: ESOPHAGOGASTRODUODENOSCOPY (EGD) WITH PROPOFOL ? ?Patient location during evaluation: Phase II ?Anesthesia Type: General ?Level of consciousness: awake ?Pain management: pain level controlled ?Vital Signs Assessment: post-procedure vital signs reviewed and stable ?Respiratory status: spontaneous breathing and respiratory function stable ?Cardiovascular status: blood pressure returned to baseline and stable ?Postop Assessment: no headache and no apparent nausea or vomiting ?Anesthetic complications: no ?Comments: Late entry ? ? ?No notable events documented. ? ? ?Last Vitals:  ?Vitals:  ? 04/11/21 1144 04/11/21 1259  ?BP: 136/81 102/63  ?Pulse: 70 69  ?Resp: 14 13  ?Temp: 37.2 ?C 36.9 ?C  ?SpO2: 94% 98%  ?  ?Last Pain:  ?Vitals:  ? 04/11/21 1259  ?TempSrc: Oral  ?PainSc: 0-No pain  ? ? ?  ?  ?  ?  ?  ?  ? ?Louann Sjogren ? ? ? ? ?

## 2021-04-11 NOTE — Transfer of Care (Signed)
Immediate Anesthesia Transfer of Care Note ? ?Patient: Earl [name] ? ?Procedure(s) Performed: ESOPHAGOGASTRODUODENOSCOPY (EGD) WITH PROPOFOL ? ?Patient Location: Short Stay ? ?Anesthesia Type:General ? ?Level of Consciousness: awake, alert  and oriented ? ?Airway & Oxygen Therapy: Patient Spontanous Breathing ? ?Post-op Assessment: Report given to RN and Post -op Vital signs reviewed and stable ? ?Post vital signs: Reviewed and stable ? ?Last Vitals:  ?Vitals Value Taken Time  ?BP    ?Temp    ?Pulse    ?Resp    ?SpO2    ? ? ?Last Pain:  ?Vitals:  ? 04/11/21 1238  ?TempSrc:   ?PainSc: 0-No pain  ?   ? ?Patients Stated Pain Goal: 5 (04/11/21 1144) ? ?Complications: No notable events documented. ?

## 2021-04-16 ENCOUNTER — Encounter (HOSPITAL_COMMUNITY): Payer: Self-pay | Admitting: Internal Medicine

## 2021-04-19 ENCOUNTER — Ambulatory Visit (INDEPENDENT_AMBULATORY_CARE_PROVIDER_SITE_OTHER): Payer: Medicaid Other

## 2021-04-19 DIAGNOSIS — R002 Palpitations: Secondary | ICD-10-CM | POA: Diagnosis not present

## 2021-04-22 LAB — CUP PACEART REMOTE DEVICE CHECK
Date Time Interrogation Session: 20230323003639
Implantable Pulse Generator Implant Date: 20201228

## 2021-04-24 NOTE — Progress Notes (Signed)
Carelink Summary Report / Loop Recorder 

## 2021-05-04 ENCOUNTER — Other Ambulatory Visit: Payer: Self-pay | Admitting: Family

## 2021-05-08 ENCOUNTER — Telehealth: Payer: Self-pay | Admitting: Family

## 2021-05-08 NOTE — Telephone Encounter (Signed)
Appt made for Monday patient aware  ?

## 2021-05-08 NOTE — Telephone Encounter (Signed)
Pt called stating that his left leg and foot are swollen. Needs to make an appt for Monday because he uses [facility] and has to give them 3 days notice. Says he cant schedule anything after 7:15 per [facility] policy.  ? ?Please call pt to schedule an appt. ?

## 2021-05-13 ENCOUNTER — Encounter: Payer: Self-pay | Admitting: Family Medicine

## 2021-05-13 ENCOUNTER — Ambulatory Visit: Payer: Medicaid Other | Admitting: Family Medicine

## 2021-05-13 VITALS — BP 143/86 | HR 85 | Temp 97.0°F | Ht 70.0 in | Wt 272.8 lb

## 2021-05-13 DIAGNOSIS — M7989 Other specified soft tissue disorders: Secondary | ICD-10-CM | POA: Diagnosis not present

## 2021-05-13 NOTE — Patient Instructions (Signed)
Edema ? ?Edema is when you have too much fluid in your body or under your skin. Edema may make your legs, feet, and ankles swell. Swelling often happens in looser tissues, such as around your eyes. This is a common condition. It gets more common as you get older. ?There are many possible causes of edema. These include: ?Eating too much salt (sodium). ?Being on your feet or sitting for a long time. ?Certain medical conditions, such as: ?Pregnancy. ?Heart failure. ?Liver disease. ?Kidney disease. ?Cancer. ?Hot weather may make edema worse. Edema is usually painless. Your skin may look swollen or shiny. ?Follow these instructions at home: ?Medicines ?Take over-the-counter and prescription medicines only as told by your doctor. ?Your doctor may prescribe a medicine to help your body get rid of extra water (diuretic). Take this medicine if you are told to take it. ?Eating and drinking ?Eat a low-salt (low-sodium) diet as told by your doctor. Sometimes, eating less salt may reduce swelling. ?Depending on the cause of your swelling, you may need to limit how much fluid you drink (fluid restriction). ?General instructions ?Raise the injured area above the level of your heart while you are sitting or lying down. ?Do not sit still or stand for a long time. ?Do not wear tight clothes. Do not wear garters on your upper legs. ?Exercise your legs. This can help the swelling go down. ?Wear compression stockings as told by your doctor. It is important that these are the right size. These should be prescribed by your doctor to prevent possible injuries. ?If elastic bandages or wraps are recommended, use them as told by your doctor. ?Contact a doctor if: ?Treatment is not working. ?You have heart, liver, or kidney disease and have symptoms of edema. ?You have sudden and unexplained weight gain. ?Get help right away if: ?You have shortness of breath or chest pain. ?You cannot breathe when you lie down. ?You have pain, redness, or  warmth in the swollen areas. ?You have heart, liver, or kidney disease and get edema all of a sudden. ?You have a fever and your symptoms get worse all of a sudden. ?These symptoms may be an emergency. Get help right away. Call 911. ?Do not wait to see if the symptoms will go away. ?Do not drive yourself to the hospital. ?Summary ?Edema is when you have too much fluid in your body or under your skin. ?Edema may make your legs, feet, and ankles swell. Swelling often happens in looser tissues, such as around your eyes. ?Raise the injured area above the level of your heart while you are sitting or lying down. ?Follow your doctor's instructions about diet and how much fluid you can drink. ?This information is not intended to replace advice given to you by your health care provider. Make sure you discuss any questions you have with your health care provider. ?Document Revised: 09/17/2020 Document Reviewed: 09/17/2020 ?Elsevier Patient Education ? 2023 Elsevier Inc. ? ?

## 2021-05-13 NOTE — Progress Notes (Signed)
? ?Subjective: ?CC: Leg swelling ?PCP: Sharion Balloon, FNP ?WFU:XNATFTD H Bisping is a 61 y.o. male presenting to clinic today for: ? ?1.  Leg swelling ?Patient reports he had a unilateral leg swelling over the last 2 months.  He denies any changes in activity.  Though he does report a fairly sedentary lifestyle.  He has been utilizing Lasix daily but this does not really seem to help.  He used to wear compression hose and that did seem to help but he notes that those socks are worn out and he no longer uses them.  Denies any calf pain.  His sister and mother had similar but he denies any family history of clotting disorder. ? ? ?ROS: Per HPI ? ?Allergies  ?Allergen Reactions  ? Amitriptyline Anaphylaxis  ? Amoxicillin Anaphylaxis and Rash  ?  Has patient had a PCN reaction causing immediate rash, facial/tongue/throat swelling, SOB or lightheadedness with hypotension: Yes ?Has patient had a PCN reaction causing severe rash involving mucus membranes or skin necrosis: No ?Has patient had a PCN reaction that required hospitalization: No ?Has patient had a PCN reaction occurring within the last 10 years: Yes ?If all of the above answers are "NO", then may proceed with Cephalosporin use. ?  ? Asa [Aspirin] Anaphylaxis  ? Penicillin G Anaphylaxis  ? Clonidine Derivatives Other (See Comments)  ?  Severe Dry Mouth  ? Hydrochlorothiazide   ?  Caused rectal bleeding and hematuria  ? Levothyroxine Nausea And Vomiting  ?  Patient states that the 75 mcg is too strong for him.  ? Trintellix [Vortioxetine] Nausea And Vomiting  ? ?Past Medical History:  ?Diagnosis Date  ? Alcohol abuse   ? Anemia   ? Anxiety   ? Arthritis   ? Colon cancer ([place])   ? Depression   ? Dysrhythmia   ? Gall stones   ? Heart burn   ? History of kidney stones   ? Hyperlipidemia   ? Hypertension   ? Hypothyroidism   ? Liver disease   ? Tachycardia   ? Typical atrial flutter ([place])   ? ? ?Current Outpatient Medications:  ?  ALPRAZolam (XANAX) 1 MG tablet,  Take 1 mg by mouth daily as needed for anxiety., Disp: , Rfl:  ?  cyanocobalamin (,VITAMIN B-12,) 1000 MCG/ML injection, Inject 1 ml into the muscle once a day for 7 days, then inject 1 ml into muscle once a week for 4 weeks, then inject 1 ml into muscle monthly., Disp: 1 mL, Rfl: 21 ?  diltiazem (CARDIZEM CD) 300 MG 24 hr capsule, TAKE ONE CAPSULE BY MOUTH DAILY (patient must have appointment FOR future refills), Disp: 30 capsule, Rfl: 6 ?  ergocalciferol (VITAMIN D2) 1.25 MG (50000 UT) capsule, Take 50,000 Units by mouth every 30 (thirty) days., Disp: , Rfl:  ?  furosemide (LASIX) 40 MG tablet, Take 1 tablet (40 mg total) by mouth daily. (Patient taking differently: Take 40 mg by mouth daily as needed for fluid or edema.), Disp: 30 tablet, Rfl: 3 ?  levothyroxine (SYNTHROID) 50 MCG tablet, TAKE 1 TABLET BY MOUTH EVERY DAY BEFORE BREAKFAST, Disp: 90 tablet, Rfl: 0 ?  LYRICA 200 MG capsule, Take 600 mg by mouth daily., Disp: , Rfl: 2 ?  pramipexole (MIRAPEX) 0.25 MG tablet, Take 0.25 mg by mouth daily., Disp: , Rfl:  ?Social History  ? ?Socioeconomic History  ? Marital status: Single  ?  Spouse name: Not on file  ? Number of children: Not  on file  ? Years of education: Not on file  ? Highest education level: Not on file  ?Occupational History  ? Not on file  ?Tobacco Use  ? Smoking status: Former  ?  Types: Cigarettes  ?  Quit date: 03/03/1979  ?  Years since quitting: 42.2  ? Smokeless tobacco: Never  ?Vaping Use  ? Vaping Use: Never used  ?Substance and Sexual Activity  ? Alcohol use: Yes  ?  Alcohol/week: 8.0 standard drinks  ?  Types: 8 Cans of beer per week  ?  Comment: every other day  ? Drug use: No  ? Sexual activity: Not on file  ?Other Topics Concern  ? Not on file  ?Social History Narrative  ? Disabled  ? Lives in [place]  ? ?Social Determinants of Health  ? ?Financial Resource Strain: Not on file  ?Food Insecurity: Not on file  ?Transportation Needs: Not on file  ?Physical Activity: Not on file  ?Stress: Not  on file  ?Social Connections: Not on file  ?Intimate Partner Violence: Not on file  ? ?Family History  ?Problem Relation Age of Onset  ? Alzheimer's disease Mother   ? Heart disease Father   ? Coronary artery disease Father   ? Liver disease Father   ? Thyroid disease Father   ? Hyperlipidemia Father   ? Heart attack Father   ? Coronary artery disease Paternal Grandfather   ? ? ?Objective: ?Office vital signs reviewed. ?BP (!) 143/86   Pulse 85   Temp (!) 97 ?F (36.1 ?C)   Ht '5\' 10"'$  (1.778 m)   Wt 272 lb 12.8 oz (123.7 kg)   SpO2 96%   BMI 39.14 kg/m?  ? ?Physical Examination:  ?General: Awake, alert, well nourished, No acute distress ?Extremities: warm, well perfused, 1+ pitting edema LLE and trace pitting edema in RLE, No cyanosis or clubbing; +2 pedal pulses bilaterally.  Negative Homans' sign.  No palpable cords in the posterior calf. ?MSK: Ambulating independently ? ?Assessment/ Plan: ?61 y.o. male  ? ?Left leg swelling - Plan: US Venous Img Lower Unilateral Left, Compression stockings ? ?Uncertain etiology.  He has excellent pedal pulses so I do not think that this is an arterial issue.  He had negative Homans and no palpable cords on exam and roughly about a 0.75 centimeter difference between circumference in the lower extremities. U/s ordered to further evaluate.  May consider referral to vascular pending results. ? ?No orders of the defined types were placed in this encounter. ? ?No orders of the defined types were placed in this encounter. ? ? ? ?Janora Norlander, DO ?[facility] ?[phone] ? ?

## 2021-05-22 ENCOUNTER — Ambulatory Visit (INDEPENDENT_AMBULATORY_CARE_PROVIDER_SITE_OTHER): Payer: Medicaid Other

## 2021-05-22 DIAGNOSIS — I471 Supraventricular tachycardia: Secondary | ICD-10-CM

## 2021-05-23 LAB — CUP PACEART REMOTE DEVICE CHECK
Date Time Interrogation Session: 20230427064244
Implantable Pulse Generator Implant Date: 20201228

## 2021-05-30 ENCOUNTER — Other Ambulatory Visit (INDEPENDENT_AMBULATORY_CARE_PROVIDER_SITE_OTHER): Payer: Self-pay | Admitting: Internal Medicine

## 2021-05-30 NOTE — Telephone Encounter (Signed)
Per Discharge 04/11/2021 Egd this medication was stopped per patient he takes this prn and does need refills.  ?

## 2021-06-03 ENCOUNTER — Ambulatory Visit: Payer: Medicaid Other | Admitting: Family

## 2021-06-06 NOTE — Progress Notes (Signed)
Carelink Summary Report / Loop Recorder 

## 2021-06-19 ENCOUNTER — Other Ambulatory Visit: Payer: Self-pay | Admitting: Internal Medicine

## 2021-06-24 LAB — CUP PACEART REMOTE DEVICE CHECK
Date Time Interrogation Session: 20230528004214
Implantable Pulse Generator Implant Date: 20201228

## 2021-06-25 ENCOUNTER — Ambulatory Visit (INDEPENDENT_AMBULATORY_CARE_PROVIDER_SITE_OTHER): Payer: Medicaid Other

## 2021-06-25 DIAGNOSIS — R002 Palpitations: Secondary | ICD-10-CM | POA: Diagnosis not present

## 2021-06-27 ENCOUNTER — Encounter: Payer: Self-pay | Admitting: Family

## 2021-06-27 ENCOUNTER — Ambulatory Visit: Payer: Medicaid Other | Admitting: Family

## 2021-06-27 VITALS — BP 112/66 | HR 61 | Temp 98.7°F | Ht 70.0 in | Wt 272.0 lb

## 2021-06-27 DIAGNOSIS — E038 Other specified hypothyroidism: Secondary | ICD-10-CM | POA: Diagnosis not present

## 2021-06-27 DIAGNOSIS — I10 Essential (primary) hypertension: Secondary | ICD-10-CM | POA: Diagnosis not present

## 2021-06-27 DIAGNOSIS — K7030 Alcoholic cirrhosis of liver without ascites: Secondary | ICD-10-CM

## 2021-06-27 DIAGNOSIS — E782 Mixed hyperlipidemia: Secondary | ICD-10-CM | POA: Diagnosis not present

## 2021-06-27 DIAGNOSIS — F1020 Alcohol dependence, uncomplicated: Secondary | ICD-10-CM

## 2021-06-27 DIAGNOSIS — F339 Major depressive disorder, recurrent, unspecified: Secondary | ICD-10-CM

## 2021-06-27 NOTE — Patient Instructions (Signed)
Health Maintenance, Male Adopting a healthy lifestyle and getting preventive care are important in promoting health and wellness. Ask your health care provider about: The right schedule for you to have regular tests and exams. Things you can do on your own to prevent diseases and keep yourself healthy. What should I know about diet, weight, and exercise? Eat a healthy diet  Eat a diet that includes plenty of vegetables, fruits, low-fat dairy products, and lean protein. Do not eat a lot of foods that are high in solid fats, added sugars, or sodium. Maintain a healthy weight Body mass index (BMI) is a measurement that can be used to identify possible weight problems. It estimates body fat based on height and weight. Your health care provider can help determine your BMI and help you achieve or maintain a healthy weight. Get regular exercise Get regular exercise. This is one of the most important things you can do for your health. Most adults should: Exercise for at least 150 minutes each week. The exercise should increase your heart rate and make you sweat (moderate-intensity exercise). Do strengthening exercises at least twice a week. This is in addition to the moderate-intensity exercise. Spend less time sitting. Even light physical activity can be beneficial. Watch cholesterol and blood lipids Have your blood tested for lipids and cholesterol at 62 years of age, then have this test every 5 years. You may need to have your cholesterol levels checked more often if: Your lipid or cholesterol levels are high. You are older than 62 years of age. You are at high risk for heart disease. What should I know about cancer screening? Many types of cancers can be detected early and may often be prevented. Depending on your health history and family history, you may need to have cancer screening at various ages. This may include screening for: Colorectal cancer. Prostate cancer. Skin cancer. Lung  cancer. What should I know about heart disease, diabetes, and high blood pressure? Blood pressure and heart disease High blood pressure causes heart disease and increases the risk of stroke. This is more likely to develop in people who have high blood pressure readings or are overweight. Talk with your health care provider about your target blood pressure readings. Have your blood pressure checked: Every 3-5 years if you are 18-39 years of age. Every year if you are 40 years old or older. If you are between the ages of 65 and 75 and are a current or former smoker, ask your health care provider if you should have a one-time screening for abdominal aortic aneurysm (AAA). Diabetes Have regular diabetes screenings. This checks your fasting blood sugar level. Have the screening done: Once every three years after age 45 if you are at a normal weight and have a low risk for diabetes. More often and at a younger age if you are overweight or have a high risk for diabetes. What should I know about preventing infection? Hepatitis B If you have a higher risk for hepatitis B, you should be screened for this virus. Talk with your health care provider to find out if you are at risk for hepatitis B infection. Hepatitis C Blood testing is recommended for: Everyone born from 1945 through 1965. Anyone with known risk factors for hepatitis C. Sexually transmitted infections (STIs) You should be screened each year for STIs, including gonorrhea and chlamydia, if: You are sexually active and are younger than 62 years of age. You are older than 62 years of age and your   health care provider tells you that you are at risk for this type of infection. Your sexual activity has changed since you were last screened, and you are at increased risk for chlamydia or gonorrhea. Ask your health care provider if you are at risk. Ask your health care provider about whether you are at high risk for HIV. Your health care provider  may recommend a prescription medicine to help prevent HIV infection. If you choose to take medicine to prevent HIV, you should first get tested for HIV. You should then be tested every 3 months for as long as you are taking the medicine. Follow these instructions at home: Alcohol use Do not drink alcohol if your health care provider tells you not to drink. If you drink alcohol: Limit how much you have to 0-2 drinks a day. Know how much alcohol is in your drink. In the [place] one drink equals one 12 oz bottle of beer (355 mL), one 5 oz [place] of wine (148 mL), or one 1 oz [place] of hard liquor (44 mL). Lifestyle Do not use any products that contain nicotine or tobacco. These products include cigarettes, chewing tobacco, and vaping devices, such as e-cigarettes. If you need help quitting, ask your health care provider. Do not use street drugs. Do not share needles. Ask your health care provider for help if you need support or information about quitting drugs. General instructions Schedule regular health, dental, and eye exams. Stay current with your vaccines. Tell your health care provider if: You often feel depressed. You have ever been abused or do not feel safe at home. Summary Adopting a healthy lifestyle and getting preventive care are important in promoting health and wellness. Follow your health care provider's instructions about healthy diet, exercising, and getting tested or screened for diseases. Follow your health care provider's instructions on monitoring your cholesterol and blood pressure. This information is not intended to replace advice given to you by your health care provider. Make sure you discuss any questions you have with your health care provider. Document Revised: 06/04/2020 Document Reviewed: 06/04/2020 Elsevier Patient Education  2023 Elsevier Inc.  

## 2021-06-27 NOTE — Progress Notes (Signed)
Subjective:    Patient ID: Earl [name], male    DOB: Apr 20, 1960, 61 y.o.   MRN: [id number]  Chief Complaint  Patient presents with   Follow-up    3 month   Pt presents to the office today for chronic follow up. He is followed by GI for alcoholic cirrhosis and malignant neoplasm of colon. He quit [facility] once a month for chronic pain and neuropathy. He is followed by Cardiologists for NSVT and atrial flutter. He is followed by Neurologists for neuropathy who gives him Lyrica.    He states he is drinking one or two 24 oz a beer a day.    He is morbid obese with a BMI of 39 with hyperlipidemia and depression.    He is currently taking lasix 20 mg, but states he takes 40 mg as needed when he gains fluid. Hypertension This is a chronic problem. The current episode started more than 1 year ago. The problem has been resolved since onset. The problem is controlled. Associated symptoms include malaise/fatigue. Pertinent negatives include no peripheral edema or shortness of breath. Risk factors for coronary artery disease include dyslipidemia, obesity, male gender and sedentary lifestyle. The current treatment provides moderate improvement. Identifiable causes of hypertension include a thyroid problem.  Thyroid Problem Symptoms include fatigue. His past medical history is significant for hyperlipidemia.  Depression        This is a chronic problem.  The current episode started more than 1 year ago.   The problem occurs intermittently.  Associated symptoms include fatigue, restlessness, decreased interest and sad.  Associated symptoms include no helplessness and no hopelessness.  Past medical history includes thyroid problem.   Hyperlipidemia This is a chronic problem. The current episode started more than 1 year ago. The problem is controlled. Recent lipid tests were reviewed and are normal. Exacerbating diseases include obesity. Pertinent negatives include no shortness of breath. Current  antihyperlipidemic treatment includes diet change. The current treatment provides no improvement of lipids. Risk factors for coronary artery disease include dyslipidemia, male sex, hypertension and a sedentary lifestyle.     Review of Systems  Constitutional:  Positive for fatigue and malaise/fatigue.  Respiratory:  Negative for shortness of breath.   Psychiatric/Behavioral:  Positive for depression.   All other systems reviewed and are negative.     Objective:   Physical Exam Vitals reviewed.  Constitutional:      General: He is not in acute distress.    Appearance: He is well-developed. He is obese.  HENT:     Head: Normocephalic.     Right Ear: Tympanic membrane normal.     Left Ear: Tympanic membrane normal.  Eyes:     General:        Right eye: No discharge.        Left eye: No discharge.     Pupils: Pupils are equal, round, and reactive to light.  Neck:     Thyroid: No thyromegaly.  Cardiovascular:     Rate and Rhythm: Normal rate and regular rhythm.     Heart sounds: Normal heart sounds. No murmur heard. Pulmonary:     Effort: Pulmonary effort is normal. No respiratory distress.     Breath sounds: Normal breath sounds. No wheezing.  Abdominal:     General: Bowel sounds are normal. There is distension.     Palpations: Abdomen is soft.     Tenderness: There is no abdominal tenderness.  Musculoskeletal:  General: No tenderness. Normal range of motion.     Cervical back: Normal range of motion and neck supple.  Skin:    General: Skin is warm and dry.     Findings: No erythema or rash.  Neurological:     Mental Status: He is alert and oriented to person, place, and time.     Cranial Nerves: No cranial nerve deficit.     Deep Tendon Reflexes: Reflexes are normal and symmetric.  Psychiatric:        Behavior: Behavior normal.        Thought Content: Thought content normal.        Judgment: Judgment normal.      BP 112/66   Pulse 61   Temp 98.7 F (37.1  C)   Ht _0  (1.778 m)   Wt 272 lb (123.4 kg)   SpO2 97%   BMI 39.03 kg/m      Assessment & Plan:  Earl [name] comes in today with chief complaint of Follow-up (3 month)   Diagnosis and orders addressed:  1. Essential hypertension  - CMP14+EGFR - CBC with Differential/Platelet  2. Alcoholic cirrhosis of liver without ascites (HCC) - CMP14+EGFR - CBC with Differential/Platelet  3. Subclinical hypothyroidism - CMP14+EGFR - CBC with Differential/Platelet  4. Mixed hyperlipidemia - CMP14+EGFR - CBC with Differential/Platelet  5. Morbid obesity ([place]) - CMP14+EGFR - CBC with Differential/Platelet  6. Depression, recurrent ([place]) - CMP14+EGFR - CBC with Differential/Platelet  7. ETOHism ([place]) - CMP14+EGFR - CBC with Differential/Platelet   Labs pending Health Maintenance reviewed Diet and exercise encouraged  Follow up plan: 4 months    Evelina Dun, FNP

## 2021-06-28 LAB — CMP14+EGFR
ALT: 28 IU/L (ref 0–44)
AST: 37 IU/L (ref 0–40)
Albumin/Globulin Ratio: 1.2 (ref 1.2–2.2)
Albumin: 4.1 g/dL (ref 3.8–4.8)
Alkaline Phosphatase: 117 IU/L (ref 44–121)
BUN/Creatinine Ratio: 11 (ref 10–24)
BUN: 13 mg/dL (ref 8–27)
Bilirubin Total: 0.9 mg/dL (ref 0.0–1.2)
CO2: 24 mmol/L (ref 20–29)
Calcium: 9 mg/dL (ref 8.6–10.2)
Chloride: 105 mmol/L (ref 96–106)
Creatinine, Ser: 1.14 mg/dL (ref 0.76–1.27)
Globulin, Total: 3.4 g/dL (ref 1.5–4.5)
Glucose: 92 mg/dL (ref 70–99)
Potassium: 4.4 mmol/L (ref 3.5–5.2)
Sodium: 140 mmol/L (ref 134–144)
Total Protein: 7.5 g/dL (ref 6.0–8.5)
eGFR: 73 mL/min/{1.73_m2} (ref >59)

## 2021-06-28 LAB — CBC WITH DIFFERENTIAL/PLATELET
Basophils Absolute: 0 10*3/uL (ref 0.0–0.2)
Basos: 1 %
EOS (ABSOLUTE): 0.2 10*3/uL (ref 0.0–0.4)
Eos: 5 %
Hematocrit: 46.4 % (ref 37.5–51.0)
Hemoglobin: 16 g/dL (ref 13.0–17.7)
Immature Grans (Abs): 0 10*3/uL (ref 0.0–0.1)
Immature Granulocytes: 0 %
Lymphocytes Absolute: 2 10*3/uL (ref 0.7–3.1)
Lymphs: 39 %
MCH: 32.7 pg (ref 26.6–33.0)
MCHC: 34.5 g/dL (ref 31.5–35.7)
MCV: 95 fL (ref 79–97)
Monocytes Absolute: 0.6 10*3/uL (ref 0.1–0.9)
Monocytes: 11 %
Neutrophils Absolute: 2.2 10*3/uL (ref 1.4–7.0)
Neutrophils: 44 %
Platelets: 113 10*3/uL — ABNORMAL LOW (ref 150–450)
RBC: 4.89 x10E6/uL (ref 4.14–5.80)
RDW: 12.4 % (ref 11.6–15.4)
WBC: 5.1 10*3/uL (ref 3.4–10.8)

## 2021-07-11 NOTE — Progress Notes (Signed)
Carelink Summary Report / Loop Recorder 

## 2021-07-12 ENCOUNTER — Other Ambulatory Visit (INDEPENDENT_AMBULATORY_CARE_PROVIDER_SITE_OTHER): Payer: Self-pay | Admitting: Internal Medicine

## 2021-07-18 ENCOUNTER — Other Ambulatory Visit (INDEPENDENT_AMBULATORY_CARE_PROVIDER_SITE_OTHER): Payer: Self-pay | Admitting: Internal Medicine

## 2021-07-24 ENCOUNTER — Encounter (INDEPENDENT_AMBULATORY_CARE_PROVIDER_SITE_OTHER): Payer: Self-pay | Admitting: Gastroenterology

## 2021-07-29 ENCOUNTER — Ambulatory Visit (INDEPENDENT_AMBULATORY_CARE_PROVIDER_SITE_OTHER): Payer: Medicaid Other

## 2021-07-29 DIAGNOSIS — I471 Supraventricular tachycardia: Secondary | ICD-10-CM | POA: Diagnosis not present

## 2021-07-30 LAB — CUP PACEART REMOTE DEVICE CHECK
Date Time Interrogation Session: 20230702231155
Implantable Pulse Generator Implant Date: 20201228

## 2021-08-08 ENCOUNTER — Ambulatory Visit: Payer: Medicaid Other | Admitting: Family

## 2021-08-20 NOTE — Progress Notes (Signed)
Carelink Summary Report / Loop Recorder 

## 2021-09-01 ENCOUNTER — Other Ambulatory Visit: Payer: Self-pay | Admitting: Family

## 2021-09-01 DIAGNOSIS — K7030 Alcoholic cirrhosis of liver without ascites: Secondary | ICD-10-CM

## 2021-09-02 ENCOUNTER — Ambulatory Visit (INDEPENDENT_AMBULATORY_CARE_PROVIDER_SITE_OTHER): Payer: Medicaid Other

## 2021-09-02 DIAGNOSIS — I471 Supraventricular tachycardia: Secondary | ICD-10-CM | POA: Diagnosis not present

## 2021-09-02 LAB — CUP PACEART REMOTE DEVICE CHECK
Date Time Interrogation Session: 20230804231542
Implantable Pulse Generator Implant Date: 20201228

## 2021-09-09 ENCOUNTER — Telehealth: Payer: Self-pay

## 2021-09-09 NOTE — Telephone Encounter (Signed)
LINQ alert received. 1 new AF/flutter event, duration 57mn, mean HR 69 AF burden 0%, no OAC  CHadsVasc score of 1   Hx of Aflutter, aflutter ablation 2018

## 2021-09-17 NOTE — Telephone Encounter (Signed)
Rhythm reviewed.  Not convinced that this is an atrial arrhythmia. We will continue to monitor.

## 2021-09-26 ENCOUNTER — Ambulatory Visit (INDEPENDENT_AMBULATORY_CARE_PROVIDER_SITE_OTHER): Payer: Medicaid Other | Admitting: Family

## 2021-09-26 ENCOUNTER — Encounter: Payer: Self-pay | Admitting: Family

## 2021-09-26 DIAGNOSIS — R259 Unspecified abnormal involuntary movements: Secondary | ICD-10-CM

## 2021-09-26 DIAGNOSIS — E538 Deficiency of other specified B group vitamins: Secondary | ICD-10-CM | POA: Diagnosis not present

## 2021-09-26 DIAGNOSIS — F1020 Alcohol dependence, uncomplicated: Secondary | ICD-10-CM

## 2021-09-26 DIAGNOSIS — G629 Polyneuropathy, unspecified: Secondary | ICD-10-CM | POA: Diagnosis not present

## 2021-09-26 DIAGNOSIS — Z6839 Body mass index (BMI) 39.0-39.9, adult: Secondary | ICD-10-CM

## 2021-09-26 MED ORDER — LYRICA 200 MG PO CAPS: 600.0000 mg | ORAL_CAPSULE | Freq: Every day | ORAL | 2 refills | Status: DC

## 2021-09-26 MED ORDER — ATENOLOL 50 MG PO TABS: 50.0000 mg | ORAL_TABLET | Freq: Every day | ORAL | 0 refills | Status: DC

## 2021-09-26 MED ORDER — PRAMIPEXOLE DIHYDROCHLORIDE 0.25 MG PO TABS: 0.2500 mg | ORAL_TABLET | Freq: Every day | ORAL | 1 refills | Status: DC

## 2021-09-26 NOTE — Addendum Note (Signed)
Addended by: Evelina Dun A on: 09/26/2021 02:11 PM   Modules accepted: Orders

## 2021-09-26 NOTE — Progress Notes (Signed)
Virtual Visit  Note Due to COVID-19 pandemic this visit was conducted virtually. This visit type was conducted due to national recommendations for restrictions regarding the COVID-19 Pandemic (e.g. social distancing, sheltering in place) in an effort to limit this patient's exposure and mitigate transmission in our community. All issues noted in this document were discussed and addressed.  A physical exam was not performed with this format.  I connected with [name] on 09/26/21 at 11:24 AM  by telephone and verified that I am speaking with the correct person using two identifiers. Earl [name] is currently located at home and  no one is currently with him during visit. The provider, Evelina Dun, FNP is located in their office at time of visit.  I discussed the limitations, risks, security and privacy concerns of performing an evaluation and management service by telephone and the availability of in person appointments. I also discussed with the patient that there may be a patient responsible charge related to this service. The patient expressed understanding and agreed to proceed.  [name] are scheduled for a virtual visit with your provider today.    Just as we do with appointments in the office, we must obtain your consent to participate.  Your consent will be active for this visit and any virtual visit you may have with one of our providers in the next 365 days.    If you have a MyChart account, I can also send a copy of this consent to you electronically.  All virtual visits are billed to your insurance company just like a traditional visit in the office.  As this is a virtual visit, video technology does not allow for your provider to perform a traditional examination.  This may limit your provider's ability to fully assess your condition.  If your provider identifies any concerns that need to be evaluated in person or the need to arrange testing such as labs, EKG, etc, we will  make arrangements to do so.    Although advances in technology are sophisticated, we cannot ensure that it will always work on either your end or our end.  If the connection with a video visit is poor, we may have to switch to a telephone visit.  With either a video or telephone visit, we are not always able to ensure that we have a secure connection.   I need to obtain your verbal consent now.   Are you willing to proceed with your visit today?   [name] has provided verbal consent on 09/26/2021 for a virtual visit (video or telephone).   Evelina Dun, [place] 09/26/2021  11:27 AM    History and Present Illness:  HPI PT calls the office today for referral to new Neurologists. He is currently seeing Dr. Merlene Laughter, however, he is leaving.  States he had seen a Neurologists, Dr. Felecia Shelling, in 2019 and would like to go back.   States anemia, neuropathy, and involuntary movements.   Has hx of alcohol abuse.   Reports his neuropathy pain is burning tingling in bilateral legs. Reports his pain is 3 out 10, but if he runs out of Lyrica his pain is a 10 out 10.  Review of Systems  Neurological:  Positive for tingling and weakness.  All other systems reviewed and are negative.    Observations/Objective: No SOB or distress noted  Assessment and Plan: 1. Neuropathy - Ambulatory referral to Neurology - LYRICA 200 MG capsule; Take 3 capsules (600 mg total)  by mouth daily.  Dispense: 90 capsule; Refill: 2  2. Morbid obesity ([place]) - Ambulatory referral to Neurology  3. ETOHism [facility]) - Ambulatory referral to Neurology - LYRICA 200 MG capsule; Take 3 capsules (600 mg total) by mouth daily.  Dispense: 90 capsule; Refill: 2  4. Vitamin B 12 deficiency - Ambulatory referral to Neurology - LYRICA 200 MG capsule; Take 3 capsules (600 mg total) by mouth daily.  Dispense: 90 capsule; Refill: 2  5. Involuntary movements - pramipexole (MIRAPEX) 0.25 MG tablet; Take 1 tablet (0.25 mg total) by  mouth daily.  Dispense: 90 tablet; Refill: 1 - Ambulatory referral to Neurology - LYRICA 200 MG capsule; Take 3 capsules (600 mg total) by mouth daily.  Dispense: 90 capsule; Refill: 2  Referral to Neurologists pending  Will refill medications for patient  Discussed we can not refill xanax Will need to be seen in person in 3 months for follow up for Lyrica  Decrease alcohol      I discussed the assessment and treatment plan with the patient. The patient was provided an opportunity to ask questions and all were answered. The patient agreed with the plan and demonstrated an understanding of the instructions.   The patient was advised to call back or seek an in-person evaluation if the symptoms worsen or if the condition fails to improve as anticipated.  The above assessment and management plan was discussed with the patient. The patient verbalized understanding of and has agreed to the management plan. Patient is aware to call the clinic if symptoms persist or worsen. Patient is aware when to return to the clinic for a follow-up visit. Patient educated on when it is appropriate to go to the emergency department.   Time call ended:  11:47 AM  I provided 23 minutes of  non face-to-face time during this encounter.    Evelina Dun, FNP

## 2021-10-02 NOTE — Progress Notes (Signed)
Carelink Summary Report / Loop Recorder 

## 2021-10-03 ENCOUNTER — Ambulatory Visit (INDEPENDENT_AMBULATORY_CARE_PROVIDER_SITE_OTHER): Payer: Medicaid Other | Admitting: Gastroenterology

## 2021-10-07 ENCOUNTER — Encounter: Payer: Self-pay | Admitting: Cardiovascular Disease

## 2021-10-07 ENCOUNTER — Ambulatory Visit (INDEPENDENT_AMBULATORY_CARE_PROVIDER_SITE_OTHER): Payer: Medicaid Other

## 2021-10-07 DIAGNOSIS — I471 Supraventricular tachycardia, unspecified: Secondary | ICD-10-CM

## 2021-10-17 ENCOUNTER — Ambulatory Visit (INDEPENDENT_AMBULATORY_CARE_PROVIDER_SITE_OTHER): Payer: Medicaid Other | Admitting: Gastroenterology

## 2021-10-25 NOTE — Progress Notes (Signed)
Carelink Summary Report / Loop Recorder 

## 2021-11-01 ENCOUNTER — Encounter: Payer: Medicaid Other | Admitting: Internal Medicine

## 2021-11-05 LAB — CUP PACEART REMOTE DEVICE CHECK
Date Time Interrogation Session: 20231009232259
Implantable Pulse Generator Implant Date: 20201228

## 2021-11-11 ENCOUNTER — Ambulatory Visit (INDEPENDENT_AMBULATORY_CARE_PROVIDER_SITE_OTHER): Payer: Medicaid Other

## 2021-11-11 DIAGNOSIS — I471 Supraventricular tachycardia, unspecified: Secondary | ICD-10-CM | POA: Diagnosis not present

## 2021-11-14 NOTE — Progress Notes (Signed)
Carelink Summary Report / Loop Recorder 

## 2021-11-25 ENCOUNTER — Emergency Department (HOSPITAL_COMMUNITY): Payer: Medicaid Other

## 2021-11-25 ENCOUNTER — Emergency Department (HOSPITAL_COMMUNITY)
Admission: EM | Admit: 2021-11-25 | Discharge: 2021-11-26 | Disposition: A | Discharge status: Home / Self Care | Payer: Medicaid Other | Attending: Emergency Medicine | Admitting: Emergency Medicine

## 2021-11-25 ENCOUNTER — Other Ambulatory Visit: Payer: Self-pay

## 2021-11-25 ENCOUNTER — Encounter (HOSPITAL_COMMUNITY): Payer: Self-pay | Admitting: Emergency Medicine

## 2021-11-25 DIAGNOSIS — S50812A Abrasion of left forearm, initial encounter: Secondary | ICD-10-CM | POA: Diagnosis not present

## 2021-11-25 DIAGNOSIS — R4781 Slurred speech: Secondary | ICD-10-CM | POA: Diagnosis not present

## 2021-11-25 DIAGNOSIS — S0081XA Abrasion of other part of head, initial encounter: Secondary | ICD-10-CM | POA: Insufficient documentation

## 2021-11-25 DIAGNOSIS — S0990XA Unspecified injury of head, initial encounter: Secondary | ICD-10-CM | POA: Diagnosis present

## 2021-11-25 DIAGNOSIS — I10 Essential (primary) hypertension: Secondary | ICD-10-CM | POA: Insufficient documentation

## 2021-11-25 DIAGNOSIS — W19XXXA Unspecified fall, initial encounter: Secondary | ICD-10-CM

## 2021-11-25 DIAGNOSIS — Z85038 Personal history of other malignant neoplasm of large intestine: Secondary | ICD-10-CM | POA: Diagnosis not present

## 2021-11-25 DIAGNOSIS — R0789 Other chest pain: Secondary | ICD-10-CM | POA: Diagnosis not present

## 2021-11-25 DIAGNOSIS — W01198A Fall on same level from slipping, tripping and stumbling with subsequent striking against other object, initial encounter: Secondary | ICD-10-CM | POA: Insufficient documentation

## 2021-11-25 DIAGNOSIS — M25462 Effusion, left knee: Secondary | ICD-10-CM | POA: Insufficient documentation

## 2021-11-25 DIAGNOSIS — M25562 Pain in left knee: Secondary | ICD-10-CM | POA: Diagnosis not present

## 2021-11-25 DIAGNOSIS — Z79899 Other long term (current) drug therapy: Secondary | ICD-10-CM | POA: Diagnosis not present

## 2021-11-25 LAB — COMPREHENSIVE METABOLIC PANEL
ALT: 49 U/L — ABNORMAL HIGH (ref 0–44)
AST: 59 U/L — ABNORMAL HIGH (ref 15–41)
Albumin: 3.7 g/dL (ref 3.5–5.0)
Alkaline Phosphatase: 94 U/L (ref 38–126)
Anion gap: 9 (ref 5–15)
BUN: 12 mg/dL (ref 8–23)
CO2: 20 mmol/L — ABNORMAL LOW (ref 22–32)
Calcium: 8.3 mg/dL — ABNORMAL LOW (ref 8.9–10.3)
Chloride: 106 mmol/L (ref 98–111)
Creatinine, Ser: 1.01 mg/dL (ref 0.61–1.24)
GFR, Estimated: >60 mL/min (ref >60)
Glucose, Bld: 103 mg/dL — ABNORMAL HIGH (ref 70–99)
Potassium: 3.5 mmol/L (ref 3.5–5.1)
Sodium: 135 mmol/L (ref 135–145)
Total Bilirubin: 1 mg/dL (ref 0.3–1.2)
Total Protein: 7.7 g/dL (ref 6.5–8.1)

## 2021-11-25 LAB — ETHANOL: Alcohol, Ethyl (B): 178 mg/dL — ABNORMAL HIGH (ref <10)

## 2021-11-25 LAB — CBC
HCT: 45.4 % (ref 39.0–52.0)
Hemoglobin: 15.6 g/dL (ref 13.0–17.0)
MCH: 33.2 pg (ref 26.0–34.0)
MCHC: 34.4 g/dL (ref 30.0–36.0)
MCV: 96.6 fL (ref 80.0–100.0)
Platelets: 140 10*3/uL — ABNORMAL LOW (ref 150–400)
RBC: 4.7 MIL/uL (ref 4.22–5.81)
RDW: 12.7 % (ref 11.5–15.5)
WBC: 7.9 10*3/uL (ref 4.0–10.5)
nRBC: 0 % (ref 0.0–0.2)

## 2021-11-25 LAB — PROTIME-INR
INR: 1.1 (ref 0.8–1.2)
Prothrombin Time: 14.4 seconds (ref 11.4–15.2)

## 2021-11-25 MED ORDER — OXYCODONE HCL 5 MG PO TABS
5.0000 mg | ORAL_TABLET | Freq: Once | ORAL | Status: AC
  Administered 2021-11-25: 5 mg via ORAL
  Filled 2021-11-25: qty 1

## 2021-11-25 NOTE — Discharge Instructions (Signed)
Call the number below to set up a follow-up appointment with orthopedic surgery regarding your knee injury.  Return to the emergency department for any new or worsening symptoms of concern.

## 2021-11-25 NOTE — ED Provider Notes (Signed)
[name] Provider Note   CSN: [id number] Arrival date & time: 11/25/21  2034     History {Add pertinent medical, surgical, social history, OB history to HPI:1} Chief Complaint  Patient presents with   [name] is a 61 y.o. male.   Fall Associated symptoms include chest pain.  Patient presents after a fall.  Medical history includes neuropathy, HTN, hematochezia, atrial flutter, obesity, depression, alcoholism, colon cancer, cirrhosis.  Patient reports drinking alcohol today.  He had a mechanical fall which she states is common for him.  At the time, he was walking.  He landed on his left side.  Since the fall, he has had pain in the left chest and left knee.  He suffered abrasions to his left forearm and left side of face.  He is not on any blood thinning medications.  He states that his tetanus is up-to-date.  Despite his left knee pain, he has been ambulatory since the fall.     Home Medications Prior to Admission medications   Medication Sig Start Date End Date Taking? Authorizing Provider  ALPRAZolam Duanne Moron) 1 MG tablet Take 1 mg by mouth daily as needed for anxiety.    [provider]  atenolol (TENORMIN) 50 MG tablet Take 1 tablet (50 mg total) by mouth daily. 09/26/21   Evelina Dun A, FNP  cyanocobalamin (,VITAMIN B-12,) 1000 MCG/ML injection Inject 1 ml into the muscle once a day for 7 days, then inject 1 ml into muscle once a week for 4 weeks, then inject 1 ml into muscle monthly. 03/13/21   Evelina Dun A, FNP  diltiazem (CARDIZEM CD) 300 MG 24 hr capsule TAKE ONE CAPSULE BY MOUTH DAILY 06/19/21   Allred, Jeneen Rinks, MD  ergocalciferol (VITAMIN D2) 1.25 MG (50000 UT) capsule Take 50,000 Units by mouth every 30 (thirty) days.    [provider]  furosemide (LASIX) 40 MG tablet TAKE 1 TABLET BY MOUTH DAILY 09/02/21   Evelina Dun A, FNP  levothyroxine (SYNTHROID) 50 MCG tablet TAKE 1 TABLET BY MOUTH EVERY DAY BEFORE  BREAKFAST 05/06/21   Hawks, Christy A, FNP  LYRICA 200 MG capsule Take 3 capsules (600 mg total) by mouth daily. 09/26/21   Hawks, Alyse Low A, FNP  ondansetron (ZOFRAN) 4 MG tablet TAKE 1 TABLET BY MOUTH TWICE DAILY AS NEEDED FOR NAUSEA OR FOR VOMITING 07/18/21   Rehman, Mechele Dawley, MD  pantoprazole (PROTONIX) 40 MG tablet TAKE 1 TABLET BY MOUTH DAILY BEFORE breakfast 05/31/21   Rehman, Mechele Dawley, MD  pramipexole (MIRAPEX) 0.25 MG tablet Take 1 tablet (0.25 mg total) by mouth daily. 09/26/21   Sharion Balloon, FNP      Allergies    Amitriptyline, Amoxicillin, Asa [aspirin], Penicillin g, Clonidine derivatives, Hydrochlorothiazide, Levothyroxine, and Trintellix [vortioxetine]    Review of Systems   Review of Systems  Cardiovascular:  Positive for chest pain.  Musculoskeletal:  Positive for arthralgias.  Skin:  Positive for wound.  All other systems reviewed and are negative.   Physical Exam Updated Vital Signs Ht '5\' 10"'$  (1.778 m)   Wt 123 kg   BMI 38.91 kg/m  Physical Exam Vitals and nursing note reviewed.  Constitutional:      General: He is not in acute distress.    Appearance: Normal appearance. He is well-developed. He is not ill-appearing, toxic-appearing or diaphoretic.  HENT:     Head: Normocephalic.     Comments: Abrasions to left temporal area  Right Ear: External ear normal.     Left Ear: External ear normal.     Nose: Nose normal.     Mouth/Throat:     Mouth: Mucous membranes are moist.     Pharynx: Oropharynx is clear.  Eyes:     Extraocular Movements: Extraocular movements intact.     Conjunctiva/sclera: Conjunctivae normal.  Cardiovascular:     Rate and Rhythm: Normal rate and regular rhythm.     Heart sounds: No murmur heard. Pulmonary:     Effort: Pulmonary effort is normal. No respiratory distress.     Breath sounds: Normal breath sounds. No wheezing or rales.  Chest:     Chest wall: Tenderness present.  Abdominal:     General: There is no distension.      Palpations: Abdomen is soft.     Tenderness: There is no abdominal tenderness.  Musculoskeletal:        General: Swelling, tenderness and signs of injury present.     Cervical back: Normal range of motion and neck supple.     Right lower leg: No edema.     Left lower leg: No edema.     Comments: Swelling and tenderness to the lateral aspect of left knee.  Active and passive range of motion intact.  Skin:    General: Skin is warm and dry.     Capillary Refill: Capillary refill takes less than 2 seconds.     Coloration: Skin is not jaundiced or pale.  Neurological:     General: No focal deficit present.     Mental Status: He is alert and oriented to person, place, and time.     Cranial Nerves: No cranial nerve deficit.     Sensory: No sensory deficit.     Motor: No weakness.     Coordination: Coordination normal.  Psychiatric:        Mood and Affect: Mood normal.        Speech: Speech is slurred.     ED Results / Procedures / Treatments   Labs (all labs ordered are listed, but only abnormal results are displayed) Labs Reviewed - No data to display  EKG None  Radiology No results found.  Procedures Procedures  {Document cardiac monitor, telemetry assessment procedure when appropriate:1}  Medications Ordered in ED Medications - No data to display  ED Course/ Medical Decision Making/ A&P                           Medical Decision Making  This patient presents to the ED for concern of ***, this involves an extensive number of treatment options, and is a complaint that carries with it a high risk of complications and morbidity.  The differential diagnosis includes ***   Co morbidities that complicate the patient evaluation  ***   Additional history obtained:  Additional history obtained from *** External records from outside source obtained and reviewed including ***   Lab Tests:  I Ordered, and personally interpreted labs.  The pertinent results include:   ***   Imaging Studies ordered:  I ordered imaging studies including ***  I independently visualized and interpreted imaging which showed *** I agree with the radiologist interpretation   Cardiac Monitoring: / EKG:  The patient was maintained on a cardiac monitor.  I personally viewed and interpreted the cardiac monitored which showed an underlying rhythm of: ***   Consultations Obtained:  I requested consultation with the ***,  and discussed lab and imaging findings as well as pertinent plan - they recommend: ***   Problem List / ED Course / Critical interventions / Medication management  *** I ordered medication including ***  for ***  Reevaluation of the patient after these medicines showed that the patient {resolved/improved/worsened:23923::"improved"} I have reviewed the patients home medicines and have made adjustments as needed   Social Determinants of Health:  ***   Test / Admission - Considered:  ***   {Document critical care time when appropriate:1} {Document review of labs and clinical decision tools ie heart score, Chads2Vasc2 etc:1}  {Document your independent review of radiology images, and any outside records:1} {Document your discussion with family members, caretakers, and with consultants:1} {Document social determinants of health affecting pt's care:1} {Document your decision making why or why not admission, treatments were needed:1} Final Clinical Impression(s) / ED Diagnoses Final diagnoses:  None    Rx / DC Orders ED Discharge Orders     None

## 2021-11-25 NOTE — ED Triage Notes (Signed)
Pt states he tripped and fell striking head. He c/o left calf pain. Pt has been drinking alcohol.

## 2021-11-27 ENCOUNTER — Telehealth: Payer: Self-pay

## 2021-11-27 NOTE — Telephone Encounter (Signed)
Transition Care Management Follow-up Telephone Call Date of discharge and from where: 11/26/2021 [name] How have you been since you were released from the hospital? Patient states that he is still having pain on left side - head injury, ribs, foot Any questions or concerns? Patient states that nothing was given for pain and would like to know if PCP can send something in for him.   Items Reviewed: Did the pt receive and understand the discharge instructions provided? Yes  Medications obtained and verified? Yes  Other? Yes  Any new allergies since your discharge? Yes  Dietary orders reviewed? NA Do you have support at home? Yes   Home Care and Equipment/Supplies: Were home health services ordered? not applicable If so, what is the name of the agency? [name]  Has the agency set up a time to come to the patient's home? not applicable Were any new equipment or medical supplies ordered?  No What is the name of the medical supply agency? [name] Were you able to get the supplies/equipment? not applicable Do you have any questions related to the use of the equipment or supplies? No  Functional Questionnaire: (I = Independent and D = Dependent) ADLs: I  Bathing/Dressing- I  Meal Prep- I  Eating- I  Maintaining continence- I  Transferring/Ambulation- I  Managing Meds- I  Patient states that he can do daily activities but he is having to go slow and take breaks as needed   Follow up appointments reviewed:  PCP Hospital f/u appt confirmed?  Patient has appt with [facility] in 12/26/2021 declines sooner appt at this time    [facility] f/u appt confirmed?  Patient declines Ortho referral recommended by ED   Are transportation arrangements needed? Yes  If their condition worsens, is the pt aware to call PCP or go to the Emergency Dept.? Yes Was the patient provided with contact information for the PCP's office or ED? Yes Was to pt encouraged to call back with questions or concerns?  Yes

## 2021-12-03 ENCOUNTER — Encounter: Payer: Self-pay | Admitting: Cardiovascular Disease

## 2021-12-03 ENCOUNTER — Ambulatory Visit: Payer: Medicaid Other | Attending: Internal Medicine | Admitting: Cardiovascular Disease

## 2021-12-03 VITALS — BP 126/70 | HR 64 | Ht 70.0 in | Wt 279.4 lb

## 2021-12-03 DIAGNOSIS — I480 Paroxysmal atrial fibrillation: Secondary | ICD-10-CM | POA: Diagnosis present

## 2021-12-03 DIAGNOSIS — I10 Essential (primary) hypertension: Secondary | ICD-10-CM

## 2021-12-03 DIAGNOSIS — I484 Atypical atrial flutter: Secondary | ICD-10-CM

## 2021-12-03 NOTE — Progress Notes (Signed)
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  Since last being seen in our clinic, the patient reports doing very well.  Today, he denies symptoms of palpitations, chest pain, shortness of breath,  lower extremity edema, dizziness, presyncope, or syncope.  Otherwise, he does complain of facial twitching that he has had for years but has been worse recently, and he notes neuropathy which causes balance issues and frequent falls.   Past Medical History:  Diagnosis Date   Alcohol abuse    Anemia    Anxiety    Arthritis    Colon cancer ([place])    Depression    Dysrhythmia    Gall stones    Heart burn    History of kidney stones    Hyperlipidemia    Hypertension    Hypothyroidism    Liver disease    Tachycardia    Typical atrial flutter [facility])    Past Surgical History:  Procedure Laterality Date   A-FLUTTER ABLATION N/A 07/31/2016   Procedure: A-Flutter Ablation;  Surgeon: [name], MD;  Location: [place] [facility];  Service: Cardiovascular;  Laterality: N/A;   COLONOSCOPY N/A 04/17/2016   Procedure: COLONOSCOPY;  Surgeon: [name], MD;  [facility];  Service: Endoscopy;  Laterality: N/A;  1200   COLONOSCOPY N/A 05/07/2017   Procedure: COLONOSCOPY;  Surgeon: [name], MD;  [facility];  Service: Endoscopy;  Laterality: N/A;  200   COLONOSCOPY WITH PROPOFOL N/A 07/25/2020   Procedure: COLONOSCOPY WITH PROPOFOL;  Surgeon: [name], MD;  [facility];  Service: Endoscopy;  Laterality: N/A;  12:10   ESOPHAGEAL BANDING  07/25/2020   Procedure: ESOPHAGEAL BANDING;  Surgeon: [name], MD;  [facility];  Service: Endoscopy;;   ESOPHAGOGASTRODUODENOSCOPY (EGD) WITH PROPOFOL N/A 07/25/2020   Procedure: ESOPHAGOGASTRODUODENOSCOPY (EGD) WITH PROPOFOL;  Surgeon: [name], MD;  [facility];  Service:  Endoscopy;  Laterality: N/A;   ESOPHAGOGASTRODUODENOSCOPY (EGD) WITH PROPOFOL N/A 04/11/2021   Procedure: ESOPHAGOGASTRODUODENOSCOPY (EGD) WITH PROPOFOL;  Surgeon: [name], MD;  [facility];  Service: Endoscopy;  Laterality: N/A;  1:00   implantable loop recorder placement  01/24/2019    Medtronic Reveal Linq model LNQ 11 implantable loop recorder (SN [id number] ) by Dr Rayann Heman for evaluation of palpitations   MINOR CARPAL TUNNEL     right   POLYPECTOMY  04/17/2016   Procedure: POLYPECTOMY;  Surgeon: [name], MD;  [facility];  Service: Endoscopy;;  sigmoid   POLYPECTOMY  07/25/2020   Procedure: POLYPECTOMY;  Surgeon: [name], MD;  [facility];  Service: Endoscopy;;    ROS- all systems are reviewed and negatives except as per HPI above  Current Outpatient Medications  Medication Sig Dispense Refill   ALPRAZolam (XANAX) 1 MG tablet Take 1 mg by mouth daily as needed for anxiety.     atenolol (TENORMIN) 50 MG tablet Take 1 tablet (50 mg total) by mouth daily. (Patient taking differently: Take 25 mg by mouth daily.) 90 tablet 0   diltiazem (CARDIZEM CD) 300 MG 24 hr capsule TAKE ONE CAPSULE BY MOUTH DAILY 30 capsule 6   furosemide (LASIX) 40 MG tablet TAKE 1 TABLET BY MOUTH DAILY (Patient taking differently: Take 40 mg by mouth daily as needed.) 30 tablet 2  LYRICA 200 MG capsule Take 3 capsules (600 mg total) by mouth daily. 90 capsule 2   ondansetron (ZOFRAN) 4 MG tablet TAKE 1 TABLET BY MOUTH TWICE DAILY AS NEEDED FOR NAUSEA OR FOR VOMITING 30 tablet 1   pantoprazole (PROTONIX) 40 MG tablet TAKE 1 TABLET BY MOUTH DAILY BEFORE breakfast 90 tablet 1   cyanocobalamin (,VITAMIN B-12,) 1000 MCG/ML injection Inject 1 ml into the muscle once a day for 7 days, then inject 1 ml into muscle once a week for 4 weeks, then inject 1 ml into muscle monthly. (Patient not taking: Reported on 12/03/2021) 1 mL 21   ergocalciferol (VITAMIN D2) 1.25 MG  (50000 UT) capsule Take 50,000 Units by mouth every 30 (thirty) days. (Patient not taking: Reported on 12/03/2021)     levothyroxine (SYNTHROID) 50 MCG tablet TAKE 1 TABLET BY MOUTH EVERY DAY BEFORE BREAKFAST (Patient not taking: Reported on 12/03/2021) 90 tablet 0   pramipexole (MIRAPEX) 0.25 MG tablet Take 1 tablet (0.25 mg total) by mouth daily. (Patient not taking: Reported on 12/03/2021) 90 tablet 1   No current facility-administered medications for this visit.    Physical Exam: Vitals:   12/03/21 1017  BP: 126/70  Pulse: 64  SpO2: 95%  Weight: 279 lb 6.4 oz (126.7 kg)  Height: '5\' 10"'$  (1.778 m)    GEN- The patient is well appearing, alert and oriented x 3 today.   Head- normocephalic, atraumatic Eyes-  Sclera clear, conjunctiva pink Ears- hearing intact Oropharynx- clear Lungs- Clear to ausculation bilaterally, normal work of breathing Heart- Regular rate and rhythm, no murmurs, rubs or gallops, PMI not laterally displaced GI- soft, NT, ND, + BS Extremities- no clubbing, cyanosis, or edema Neuro - left facial twitch  Wt Readings from Last 3 Encounters:  12/03/21 279 lb 6.4 oz (126.7 kg)  11/25/21 271 lb 2.7 oz (123 kg)  06/27/21 272 lb (123.4 kg)      Assessment and Plan:  SVT Well controlled by ILR review today S/p prior atach and CTI ablation  2. Atrial fibrillation Diagnosed by ILR in the past few months - 2 episodes to-date V-rates controlled CHADS2VASC is 1. Pt has increased risk of bleeding with frequent falls as well as esophageal varices. Will continue to monitor. May consider ablation in future.  3. HTN Stable No change required today  4. ETOH Avoidance advised  5. Overweight Body mass index is 40.09 kg/m. Lifestyle modification advised  Risks, benefits and potential toxicities for medications prescribed and/or refilled reviewed with patient today.   Return in a year  Melida Quitter, MD  12/03/2021 11:26 AM

## 2021-12-03 NOTE — Patient Instructions (Addendum)
Medication Instructions:  Continue all current medications.  Labwork: none  Testing/Procedures: none  Follow-Up: 6 mo - Dr.  Myles Gip  Any Other Special Instructions Will Be Listed Below (If Applicable).   If you need a refill on your cardiac medications before your next appointment, please call your pharmacy.

## 2021-12-09 ENCOUNTER — Encounter (INDEPENDENT_AMBULATORY_CARE_PROVIDER_SITE_OTHER): Payer: Self-pay | Admitting: *Deleted

## 2021-12-09 ENCOUNTER — Encounter (INDEPENDENT_AMBULATORY_CARE_PROVIDER_SITE_OTHER): Payer: Self-pay | Admitting: Gastroenterology

## 2021-12-09 ENCOUNTER — Ambulatory Visit (INDEPENDENT_AMBULATORY_CARE_PROVIDER_SITE_OTHER): Payer: Medicaid Other | Admitting: Gastroenterology

## 2021-12-09 VITALS — BP 105/69 | HR 58 | Temp 98.4°F | Ht 70.0 in | Wt 278.9 lb

## 2021-12-09 DIAGNOSIS — K7031 Alcoholic cirrhosis of liver with ascites: Secondary | ICD-10-CM

## 2021-12-09 DIAGNOSIS — F1020 Alcohol dependence, uncomplicated: Secondary | ICD-10-CM | POA: Diagnosis not present

## 2021-12-09 DIAGNOSIS — I8510 Secondary esophageal varices without bleeding: Secondary | ICD-10-CM

## 2021-12-09 DIAGNOSIS — I8500 Esophageal varices without bleeding: Secondary | ICD-10-CM | POA: Insufficient documentation

## 2021-12-09 DIAGNOSIS — Z85038 Personal history of other malignant neoplasm of large intestine: Secondary | ICD-10-CM | POA: Diagnosis not present

## 2021-12-09 NOTE — Progress Notes (Signed)
Earl [name], M.D. Gastroenterology & Hepatology [facility] [address] [place], [place] [address]  Primary Care Physician: Sharion Balloon, [place] [address]  I will communicate my assessment and recommendations to the referring MD via EMR.  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  The patient was last seen on 04/24/2020 (Dr. Laural Golden). At that time, the patient was advised to quit alcohol.  Patient reports that he had a fall at home while drinking and had to go to the ER on 11/25/2021.states that he is having significant chest pain after his fall and that is his main complaint today.  He otherwise denies any nausea, vomiting, chills, hematochezia, melena, hematemesis, abdominal distention, abdominal pain, diarrhea, jaundice, pruritus . Feels he has gained close to 15 lb as he has not been moving around after his fall.  Notably, the patient had a colonoscopy in March 2018 where he was found to have a 10 mm semipedunculated sigmoid colon polyp which was removed with a hot snare.  The polyp had adenocarcinoma in the tubular adenoma with high-grade dysplasia.  This was an invasive carcinoma that invaded into submucosal stroma of the polyp.  The carcinoma was moderately differentiated and was 0.1 cm from the cauterized edge of the polyp.  There was also presence of possible  lymph vascular involvement by the tumor.  Based on the notes from Dr. Laural Golden, the patient opted for non surgical management. He has been monitored with CT scans and CEA testing.The most recent CT of the abdomen and pelvis without contrast in December 2021 showed changes concerning for cirrhosis with portal hypertension upper esophageal varices.Most recent CEA 04/24/20 was 0.5.  Most recent blood work-up from 11/25/2021 showed an AST of 59, ALT of 49, alkaline phosphatase 94, albumin 3.7, TB 1.0, creatinine 1.01, BUN 12, normal electrolytes with sodium 135, potassium 3.5, CBC with WBC 7.9, hemoglobin 15.6 and platelets 140.  INR was 1.1.  Notably, he had positive ethanol in blood on 11/25/2021. He states he is drinking "a little alcohol", states he drinks 2-3 drinks a day.  Has presented some dry mouth and believes Lyrica can cause some of these symptoms.  Cirrhosis related questions: Hematemesis/coffee ground emesis: No History of variceal bleeding: No Abdominal pain: No Abdominal distention/worsening ascites: No Fever/chills: Yes, some low grade temperature after he had a fall Episodes of confusion/disorientation: No Number of daily bowel movements:1 Taking diuretics?: Takes Lasix 40 mg intermittently, takes it 1-2 times a week Prior history of banding?: No Prior episodes of SBP: No Last time liver imaging was performed:06/2018 [place], no liver masses, had CT of the abdomen and pelvis with IV contrast performed in the past with last in 2021 as described above. MELD score: 9 in 10/2021  Last EGD: 04/11/2021 - Scar in the distal esophagus. - Grade I esophageal varices. - Z-line irregular, 40 cm from the incisors. - Portal hypertensive gastropathy. - A single gastric polyp. Appearance suggestive of hyperplastic polyp. It was left alone. - Normal duodenal bulb and second portion of the duodenum.  Recommended repeat EGD in 1 year  Last Colonoscopy: 07/25/2020 - Two 3 to 5 mm polyps at the  splenic flexure, removed with a cold snare. Complete resection. Partial retrieval. - External and internal hemorrhoids.  Recommended repeat colonoscopy in 5 years  Past Medical History: Past Medical History:  Diagnosis Date   Alcohol abuse    Anemia    Anxiety    Arthritis    Colon cancer ([place])    Depression    Dysrhythmia    Gall stones    Heart burn    History of kidney stones    Hyperlipidemia    Hypertension    Hypothyroidism    Liver disease    Tachycardia    Typical atrial flutter ([place])     Past Surgical History: Past Surgical History:  Procedure Laterality Date   A-FLUTTER ABLATION N/A 07/31/2016   Procedure: A-Flutter Ablation;  Surgeon: [name], MD;  Location: [facility];  Service: Cardiovascular;  Laterality: N/A;   COLONOSCOPY N/A 04/17/2016   Procedure: COLONOSCOPY;  Surgeon: [name], MD;  [facility]: [facility];  Service: Endoscopy;  Laterality: N/A;  1200   COLONOSCOPY N/A 05/07/2017   Procedure: COLONOSCOPY;  Surgeon: [name], MD;  [facility]: [facility];  Service: Endoscopy;  Laterality: N/A;  200   COLONOSCOPY WITH PROPOFOL N/A 07/25/2020   Procedure: COLONOSCOPY WITH PROPOFOL;  Surgeon: [name], MD;  [facility]: [facility];  Service: Endoscopy;  Laterality: N/A;  12:10   ESOPHAGEAL BANDING  07/25/2020   Procedure: ESOPHAGEAL BANDING;  Surgeon: [name], MD;  [facility]: [facility];  Service: Endoscopy;;   ESOPHAGOGASTRODUODENOSCOPY (EGD) WITH PROPOFOL N/A 07/25/2020   Procedure: ESOPHAGOGASTRODUODENOSCOPY (EGD) WITH PROPOFOL;  Surgeon: [name], MD;  [facility];  Service: Endoscopy;  Laterality: N/A;   ESOPHAGOGASTRODUODENOSCOPY (EGD) WITH PROPOFOL N/A 04/11/2021   Procedure: ESOPHAGOGASTRODUODENOSCOPY (EGD) WITH PROPOFOL;  Surgeon: [name], MD;  [facility]: [facility];  Service: Endoscopy;  Laterality: N/A;  1:00   implantable loop recorder placement  01/24/2019    [employer]  Reveal Linq model LNQ 11 implantable loop recorder (SN [id number] ) by Dr Rayann Heman for evaluation of palpitations   MINOR CARPAL TUNNEL     right   POLYPECTOMY  04/17/2016   Procedure: POLYPECTOMY;  Surgeon: [name], MD;  [facility]: [facility];  Service: Endoscopy;;  sigmoid   POLYPECTOMY  07/25/2020   Procedure: POLYPECTOMY;  Surgeon: [name], MD;  [facility];  Service: Endoscopy;;    Family History: Family History  Problem Relation Age of Onset   Alzheimer's disease Mother    Heart disease Father    Coronary artery disease Father    Liver disease Father    Thyroid disease Father    Hyperlipidemia Father    Heart attack Father    Coronary artery disease Paternal Grandfather     Social History: Social History   Tobacco Use  Smoking Status Former   Types: Cigarettes   Quit date: 03/03/1979   Years since quitting: 42.8  Smokeless Tobacco Never   Social History   Substance and Sexual Activity  Alcohol Use Yes   Alcohol/week: 8.0 standard drinks of alcohol   Types: 8 Cans of beer per week   Comment: every other day   Social History   Substance and Sexual Activity  Drug Use No    Allergies: Allergies  Allergen Reactions   Amitriptyline Anaphylaxis  Amoxicillin Anaphylaxis and Rash    Has patient had a PCN reaction causing immediate rash, facial/tongue/throat swelling, SOB or lightheadedness with hypotension: Yes Has patient had a PCN reaction causing severe rash involving mucus membranes or skin necrosis: No Has patient had a PCN reaction that required hospitalization: No Has patient had a PCN reaction occurring within the last 10 years: Yes If all of the above answers are "NO", then may proceed with Cephalosporin use.    Asa [Aspirin] Anaphylaxis   Penicillin G Anaphylaxis   Clonidine Derivatives Other (See Comments)    Severe Dry Mouth   Hydrochlorothiazide     Caused rectal bleeding and hematuria   Levothyroxine Nausea And  Vomiting    Patient states that the 75 mcg is too strong for him.   Trintellix [Vortioxetine] Nausea And Vomiting    Medications: Current Outpatient Medications  Medication Sig Dispense Refill   ALPRAZolam (XANAX) 1 MG tablet Take 1 mg by mouth daily as needed for anxiety.     atenolol (TENORMIN) 50 MG tablet Take 1 tablet (50 mg total) by mouth daily. (Patient taking differently: Take 25 mg by mouth daily.) 90 tablet 0   diltiazem (CARDIZEM CD) 300 MG 24 hr capsule TAKE ONE CAPSULE BY MOUTH DAILY 30 capsule 6   furosemide (LASIX) 40 MG tablet TAKE 1 TABLET BY MOUTH DAILY (Patient taking differently: Take 40 mg by mouth daily as needed.) 30 tablet 2   LYRICA 200 MG capsule Take 3 capsules (600 mg total) by mouth daily. 90 capsule 2   ondansetron (ZOFRAN) 4 MG tablet TAKE 1 TABLET BY MOUTH TWICE DAILY AS NEEDED FOR NAUSEA OR FOR VOMITING 30 tablet 1   pantoprazole (PROTONIX) 40 MG tablet TAKE 1 TABLET BY MOUTH DAILY BEFORE breakfast 90 tablet 1   No current facility-administered medications for this visit.    Review of Systems: GENERAL: negative for malaise, night sweats HEENT: No changes in hearing or vision, no nose bleeds or other nasal problems. NECK: Negative for lumps, goiter, pain and significant neck swelling RESPIRATORY: Negative for cough, wheezing CARDIOVASCULAR: Negative for chest pain, leg swelling, palpitations, orthopnea GI: SEE HPI MUSCULOSKELETAL: Negative for joint pain or swelling, back pain, and muscle pain. SKIN: Negative for lesions, rash PSYCH: Negative for sleep disturbance, mood disorder and recent psychosocial stressors. HEMATOLOGY Negative for prolonged bleeding, bruising easily, and swollen nodes. ENDOCRINE: Negative for cold or heat intolerance, polyuria, polydipsia and goiter. NEURO: negative for tremor, gait imbalance, syncope and seizures. The remainder of the review of systems is noncontributory.   Physical Exam: BP 105/69 (BP Location: Left Arm,  Patient Position: Sitting, Cuff Size: Large)   Pulse (!) 58   Temp 98.4 F (36.9 C) (Temporal)   Ht '5\' 10"'$  (1.778 m)   Wt 278 lb 14.4 oz (126.5 kg)   BMI 40.02 kg/m  GENERAL: The patient is AO x3, in no acute distress. HEENT: Head is normocephalic and atraumatic. EOMI are intact. Mouth is well hydrated and without lesions. NECK: Supple. No masses LUNGS: Clear to auscultation. No presence of rhonchi/wheezing/rales. Adequate chest expansion HEART: RRR, normal s1 and s2. ABDOMEN: Soft, nontender, no guarding, no peritoneal signs, and nondistended. BS +. No masses. EXTREMITIES: Without any cyanosis, clubbing, rash, lesions or edema. NEUROLOGIC: AOx3, no focal motor deficit. SKIN: no jaundice, no rashes  Imaging/Labs: as above  I personally reviewed and interpreted the available labs, imaging and endoscopic files.  Impression and Plan: RAWLEY HARJU is a 61 y.o. 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  The patient has decompensated liver cirrhosis given the presence of ascites with other signs of portal hypertension as he had grade 1 esophageal varices.  His MELD score has remained low but he has been continued drinking alcohol.  We had a thorough discussion regarding the importance of quitting alcohol intake to avoid disease progression which he understood.  He is due for [facility] screening, we will proceed with AFP and liver ultrasound at this moment.  He will need to have a repeat EGD a year from his last for esophageal variceal screening.  I advised him to increase the intake of Lasix if he were to have worsening abdominal distention or lower extremity edema.  At this moment he is not  presenting any signs of third spacing.  Finally, we thoroughly discussed the fact that he had presence of an invasive carcinoma in the sigmoid with lymph involvement.  I discussed with him that as he elected a nonsurgical surveillance for this, it is possible that this malignancy may recur in the future given the high risk features described in the pathology.  Given his liver cirrhosis and the fact that he had negative colonoscopy in 2022 with negative CT scan and normal CEA, we will continue monitoring him with serial CEA every year.  We will recommend repeating a colonoscopy in 2025.  - Patient was counseled regarding the importance of alcohol cessation. The patient was informed about the long term effects of continuous alcohol intake. Need to completely stop alcohol intake. - Check CEA and AFP - Schedule liver US -May increase Lasix to daily dosing if worsening abdominal distention or lower extremity edema - Reduce salt intake to <2 g per day - Can take Tylenol max of 2 g per day (650 mg q8h) for pain - Avoid NSAIDs for pain - Avoid eating raw oysters/shellfish - Protein shake (Ensure or Boost) every night before going to sleep -Repeat colonoscopy in 2025  All questions were answered.      Earl Peppers, MD Gastroenterology and Hepatology [facility]

## 2021-12-09 NOTE — Patient Instructions (Signed)
-   Patient was counseled regarding the importance of alcohol cessation. The patient was informed about the long term effects of continuous alcohol intake. Need to completely stop alcohol intake. - Check CEA and AFP - Schedule liver US - Reduce salt intake to <2 g per day - Can take Tylenol max of 2 g per day (650 mg q8h) for pain - Avoid NSAIDs for pain - Avoid eating raw oysters/shellfish - Protein shake (Ensure or Boost) every night before going to sleep

## 2021-12-10 ENCOUNTER — Telehealth: Payer: Self-pay | Admitting: Neurology

## 2021-12-10 ENCOUNTER — Ambulatory Visit: Payer: Medicaid Other | Admitting: Neurology

## 2021-12-10 ENCOUNTER — Encounter: Payer: Self-pay | Admitting: Neurology

## 2021-12-10 VITALS — BP 107/68 | HR 45 | Ht 70.0 in | Wt 280.0 lb

## 2021-12-10 DIAGNOSIS — R259 Unspecified abnormal involuntary movements: Secondary | ICD-10-CM

## 2021-12-10 DIAGNOSIS — F1020 Alcohol dependence, uncomplicated: Secondary | ICD-10-CM

## 2021-12-10 DIAGNOSIS — G5132 Clonic hemifacial spasm, left: Secondary | ICD-10-CM

## 2021-12-10 DIAGNOSIS — G629 Polyneuropathy, unspecified: Secondary | ICD-10-CM

## 2021-12-10 DIAGNOSIS — R200 Anesthesia of skin: Secondary | ICD-10-CM

## 2021-12-10 DIAGNOSIS — E538 Deficiency of other specified B group vitamins: Secondary | ICD-10-CM | POA: Diagnosis not present

## 2021-12-10 MED ORDER — ALPRAZOLAM 1 MG PO TABS: 1.0000 mg | ORAL_TABLET | Freq: Every day | ORAL | 3 refills | Status: DC | PRN

## 2021-12-10 MED ORDER — OXCARBAZEPINE 300 MG PO TABS: 300.0000 mg | ORAL_TABLET | Freq: Two times a day (BID) | ORAL | 5 refills | Status: DC

## 2021-12-10 MED ORDER — LYRICA 200 MG PO CAPS: 600.0000 mg | ORAL_CAPSULE | Freq: Every day | ORAL | 2 refills | Status: DC

## 2021-12-10 NOTE — Telephone Encounter (Signed)
 medicaid NPR sent to [facility] [phone]

## 2021-12-10 NOTE — Progress Notes (Signed)
[facility]  PATIENT: Earl [name] DOB: 02-Sep-1960  REFERRING DOCTOR OR PCP:  Dr. Evette Doffing SOURCE: Patient, notes from primary care, imaging reports, MRI images personally reviewed on PACS  _________________________________   HISTORICAL  CHIEF COMPLAINT:  Chief Complaint  Patient presents with   New Patient (Initial Visit)    Pt in room #10 and alone. Pt here today for neuropathy.    HISTORY OF PRESENT ILLNESS:  Earl [name] is a 61 y.o. man with numbness in his legs and left facial twitching.      He is a 61 year old man who had pernicious anemia /  severe B12 deficiency around 2012.    He was started on B12 shots and the numbness improved but did not resolve.   Since that time his balance has been poor.  He was diagnosed with a small fiber polyneuropathy after a biopsy by Dr. Merlene Laughter.     He continues to note numbness and pain in his legs and takes pregabalin 200 mg po tid. the pregabalin has helped to reduce the pain.  He also notes twitches/spasms in the left side of his face that started in early 2017.   This has worsened over the last few years and occurs frequently throughout the day.   He never had weakness in the face (Bell's palsy).   He also notes some numbness and a funny sensation in the left face.   He sometimes drools on that side.   Imitially, the symptoms seemed to fluctuate but now are more constant. His eye closes when he smiles.     He also has carpal tunnel syndrome on the left diagnosed a couple years ago (we don't have the study).    He also has restless syndrome and is fidgety in general in his legs.   Sometimes leg jerking wakes him up.   He has some anxiety and occasioanlly takes alprazolam.     He has a history of alcohol abuse and has alcoholic cirrhosis followed by GI.   He has atrial flutter, treated with ablation with benefit.    He has ben found to have mild hypothyroid and used to be on synthroid.   He has a history of alcohol  abuse (2 bottles of wine a day) but stopped a couple years ago and has not resumed.  IMAGING: MRI Brain 06/23/2012.  Shows some scattered T2/FLAIR hyperintense foci predominantly in the deep white matter.  There was one focus in the cerebellum also.  This is a nonspecific finding and most likely represents mild chronic microvascular ischemic change.  The lesions do not have a demyelinating appearance.  There were no acute findings.  MRI Brain 05/27/2017 showed a few scattered small T2/FLAIR hyperintense foci in the cerebral hemispheres consistent with minimal age-related chronic microvascular ischemic change.  No definite change compared to the 06/23/2012 MRI.  Normal enhancement pattern.  The left vertebral artery is dominant and tortuous.  MRI cervical spine 05/27/2017 shows a normal spinal cord.  There are mild degenerative changes at C3-C4 through C6-C7 but no spinal stenosis or nerve root compression.  REVIEW OF SYSTEMS: Constitutional: No fevers, chills, sweats, or change in appetite.     Eyes: No visual changes, double vision, eye pain Ear, nose and throat: No hearing loss, ear pain, nasal congestion, sore throat Cardiovascular: No chest pain, palpitations Respiratory:  No shortness of breath at rest or with exertion.   No wheezes GastrointestinaI: No nausea, vomiting, diarrhea, abdominal pain, fecal incontinence Genitourinary:  No dysuria, urinary retention or frequency.  No nocturia. Musculoskeletal:  No neck pain, back pain Integumentary: No rash, pruritus, skin lesions Neurological: as above Psychiatric: No depression at this time.  No anxiety Endocrine: No palpitations, diaphoresis, change in appetite, change in weigh or increased thirst Hematologic/Lymphatic:  No anemia, purpura, petechiae. Allergic/Immunologic: No itchy/runny eyes, nasal congestion, recent allergic reactions, rashes  ALLERGIES: Allergies  Allergen Reactions   Amitriptyline Anaphylaxis   Amoxicillin Anaphylaxis  and Rash    Has patient had a PCN reaction causing immediate rash, facial/tongue/throat swelling, SOB or lightheadedness with hypotension: Yes Has patient had a PCN reaction causing severe rash involving mucus membranes or skin necrosis: No Has patient had a PCN reaction that required hospitalization: No Has patient had a PCN reaction occurring within the last 10 years: Yes If all of the above answers are "NO", then may proceed with Cephalosporin use.    Asa [Aspirin] Anaphylaxis   Penicillin G Anaphylaxis   Clonidine Derivatives Other (See Comments)    Severe Dry Mouth   Hydrochlorothiazide     Caused rectal bleeding and hematuria   Levothyroxine Nausea And Vomiting    Patient states that the 75 mcg is too strong for him.   Trintellix [Vortioxetine] Nausea And Vomiting    HOME MEDICATIONS:  Current Outpatient Medications:    atenolol (TENORMIN) 50 MG tablet, Take 1 tablet (50 mg total) by mouth daily. (Patient taking differently: Take 25 mg by mouth daily.), Disp: 90 tablet, Rfl: 0   diltiazem (CARDIZEM CD) 300 MG 24 hr capsule, TAKE ONE CAPSULE BY MOUTH DAILY, Disp: 30 capsule, Rfl: 6   furosemide (LASIX) 40 MG tablet, TAKE 1 TABLET BY MOUTH DAILY (Patient taking differently: Take 40 mg by mouth daily as needed.), Disp: 30 tablet, Rfl: 2   ondansetron (ZOFRAN) 4 MG tablet, TAKE 1 TABLET BY MOUTH TWICE DAILY AS NEEDED FOR NAUSEA OR FOR VOMITING, Disp: 30 tablet, Rfl: 1   Oxcarbazepine (TRILEPTAL) 300 MG tablet, Take 1 tablet (300 mg total) by mouth 2 (two) times daily., Disp: 60 tablet, Rfl: 5   pantoprazole (PROTONIX) 40 MG tablet, TAKE 1 TABLET BY MOUTH DAILY BEFORE breakfast, Disp: 90 tablet, Rfl: 1   ALPRAZolam (XANAX) 1 MG tablet, Take 1 tablet (1 mg total) by mouth daily as needed for anxiety. Must last 30 or more day before refill, Disp: 30 tablet, Rfl: 3   LYRICA 200 MG capsule, Take 3 capsules (600 mg total) by mouth daily., Disp: 90 capsule, Rfl: 2  PAST MEDICAL  HISTORY: Past Medical History:  Diagnosis Date   Alcohol abuse    Anemia    Anxiety    Arthritis    Colon cancer ([place])    Depression    Dysrhythmia    Gall stones    Heart burn    History of kidney stones    Hyperlipidemia    Hypertension    Hypothyroidism    Liver disease    Tachycardia    Typical atrial flutter ([place])     PAST SURGICAL HISTORY: Past Surgical History:  Procedure Laterality Date   A-FLUTTER ABLATION N/A 07/31/2016   Procedure: A-Flutter Ablation;  Surgeon: [name], MD;  Location: [facility];  Service: Cardiovascular;  Laterality: N/A;   COLONOSCOPY N/A 04/17/2016   Procedure: COLONOSCOPY;  Surgeon: [name], MD;  [facility];  Service: Endoscopy;  Laterality: N/A;  1200   COLONOSCOPY N/A 05/07/2017   Procedure: COLONOSCOPY;  Surgeon: [name], MD;  [facility];  Service: Endoscopy;  Laterality: N/A;  200   COLONOSCOPY WITH PROPOFOL N/A 07/25/2020   Procedure: COLONOSCOPY WITH PROPOFOL;  Surgeon: [name], MD;  [facility];  Service: Endoscopy;  Laterality: N/A;  12:10   ESOPHAGEAL BANDING  07/25/2020   Procedure: ESOPHAGEAL BANDING;  Surgeon: [name], MD;  [facility];  Service: Endoscopy;;   ESOPHAGOGASTRODUODENOSCOPY (EGD) WITH PROPOFOL N/A 07/25/2020   Procedure: ESOPHAGOGASTRODUODENOSCOPY (EGD) WITH PROPOFOL;  Surgeon: [name], MD;  [facility];  Service: Endoscopy;  Laterality: N/A;   ESOPHAGOGASTRODUODENOSCOPY (EGD) WITH PROPOFOL N/A 04/11/2021   Procedure: ESOPHAGOGASTRODUODENOSCOPY (EGD) WITH PROPOFOL;  Surgeon: [name], MD;  [facility];  Service: Endoscopy;  Laterality: N/A;  1:00   implantable loop recorder placement  01/24/2019    Medtronic Reveal Linq model LNQ 11 implantable loop recorder (SN [id number] ) by Dr Rayann Heman for evaluation of palpitations   MINOR CARPAL TUNNEL     right   POLYPECTOMY  04/17/2016   Procedure:  POLYPECTOMY;  Surgeon: [name], MD;  [facility];  Service: Endoscopy;;  sigmoid   POLYPECTOMY  07/25/2020   Procedure: POLYPECTOMY;  Surgeon: [name], MD;  [facility];  Service: Endoscopy;;    FAMILY HISTORY: Family History  Problem Relation Age of Onset   Alzheimer's disease Mother    Heart disease Father    Coronary artery disease Father    Liver disease Father    Thyroid disease Father    Hyperlipidemia Father    Heart attack Father    Coronary artery disease Paternal Grandfather     SOCIAL HISTORY:  Social History   Socioeconomic History   Marital status: Single    Spouse name: Not on file   Number of children: Not on file   Years of education: Not on file   Highest education level: Not on file  Occupational History   Not on file  Tobacco Use   Smoking status: Former    Types: Cigarettes    Quit date: 03/03/1979    Years since quitting: 42.8   Smokeless tobacco: Never  Vaping Use   Vaping Use: Never used  Substance and Sexual Activity   Alcohol use: Yes    Alcohol/week: 8.0 standard drinks of alcohol    Types: 8 Cans of beer per week    Comment: every other day   Drug use: No   Sexual activity: Not on file  Other Topics Concern   Not on file  Social History Narrative   Disabled   Lives in [place] Determinants of Health   Financial Resource Strain: Not on file  Food Insecurity: Not on file  Transportation Needs: Not on file  Physical Activity: Not on file  Stress: Not on file  Social Connections: Not on file  Intimate Partner Violence: Not on file     PHYSICAL EXAM  Vitals:   12/10/21 1030  BP: 107/68  Pulse: (!) 45  Weight: 280 lb (127 kg)  Height: '5\' 10"'$  (1.778 m)    Body mass index is 40.18 kg/m.   General: The patient is well-developed and well-nourished and in no acute distress  Eyes:  Funduscopic exam shows normal optic discs and retinal vessels.  Neck: The neck is supple, no  carotid bruits are noted.  The neck is nontender.  Cardiovascular: The heart has a regular rate and rhythm with a normal S1  and S2. There were no murmurs, gallops or rubs.    Skin: Extremities are without significant edema.  Musculoskeletal:  Back is nontender  Neurologic Exam  Mental status: The patient is alert and oriented x 3 at the time of the examination. The patient has apparent normal recent and remote memory, with an apparently normal attention span and concentration ability.   Speech is normal.  Cranial nerves: Extraocular movements are full. Pupils are equal, round, and reactive to light and accomodation.  Visual fields are full.  Although facial muscles appear strong, there is slight asymmetry with slight ptosis on the left.  Of note, there is evidence of cross reinnervation with eye closure upon smiling and the left corner of the mouth going up when he closes his eyes.  Frequent left hemifacial spasms are noted.  There is good facial sensation to soft touch bilaterally.Trapezius and sternocleidomastoid strength is normal. No dysarthria is noted.  The tongue is midline, and the patient has symmetric elevation of the soft palate. No obvious hearing deficits are noted.  Motor:  Muscle bulk is normal.   Tone is normal. Strength is  5 / 5 in all 4 extremities.   Sensory: Sensory testing is intact to pinprick, soft touch and vibration sensation in the arms and he has mild reduced vibration sensation in the toes..  Coordination: Cerebellar testing reveals good finger-nose-finger and heel-to-shin bilaterally.  Gait and station: Station is normal.   Gait is normal. Tandem gait is slightly wide. Romberg is negative.   Reflexes: Deep tendon reflexes are symmetric and normal bilaterally.   Plantar responses are flexor.    DIAGNOSTIC DATA (LABS, IMAGING, TESTING) - I reviewed patient records, labs, notes, testing and imaging myself where available.  Lab Results  Component Value Date    WBC 7.9 11/25/2021   HGB 15.6 11/25/2021   HCT 45.4 11/25/2021   MCV 96.6 11/25/2021   PLT 140 (L) 11/25/2021      Component Value Date/Time   NA 135 11/25/2021 2112   NA 140 06/27/2021 1227   K 3.5 11/25/2021 2112   CL 106 11/25/2021 2112   CO2 20 (L) 11/25/2021 2112   GLUCOSE 103 (H) 11/25/2021 2112   BUN 12 11/25/2021 2112   BUN 13 06/27/2021 1227   CREATININE 1.01 11/25/2021 2112   CREATININE 0.83 07/18/2020 1304   CALCIUM 8.3 (L) 11/25/2021 2112   PROT 7.7 11/25/2021 2112   PROT 7.5 06/27/2021 1227   ALBUMIN 3.7 11/25/2021 2112   ALBUMIN 4.1 06/27/2021 1227   AST 59 (H) 11/25/2021 2112   ALT 49 (H) 11/25/2021 2112   ALKPHOS 94 11/25/2021 2112   BILITOT 1.0 11/25/2021 2112   BILITOT 0.9 06/27/2021 1227   GFRNONAA >60 11/25/2021 2112   GFRNONAA 71 09/06/2019 1313   GFRAA 82 09/06/2019 1313   Lab Results  Component Value Date   CHOL 211 (H) 03/02/2019   HDL 27 (L) 03/02/2019   LDLCALC 152 (H) 03/02/2019   TRIG 174 (H) 03/02/2019   CHOLHDL 7.8 (H) 03/02/2019   Lab Results  Component Value Date   HGBA1C 4.5 (L) 07/26/2019   Lab Results  Component Value Date   VITAMINB12 186 (L) 03/06/2021   Lab Results  Component Value Date   TSH 3.790 03/06/2021       ASSESSMENT AND PLAN  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 is a 61 year old man who had severe B12 deficiency 10 to 11 years ago with continued dysesthesias and reduced balance since and left hemifacial spasms since around 2017.    We discussed that the hemifacial spasms might respond best to Botox therapy but he is reluctant to try that at this time.  Some medications may help hemifacial spasms and we could consider seeing how he responds  before reconsidering Botox.  I will also check an MRI of the brain with thin cuts through the internal auditory canals to better evaluate whether he has a vascular loop (from left vertebral artery) that could be distorting the left facial nerve.  Although he denies a history of Bell's palsy, he appears to have coarse reinnervation.  This is likely related to the hemifacial spasms.  I will have him try oxcarbazepine 300 mg p.o. twice daily to see if that helps the spasms.  If no benefit after a month he will stop.  I will renew Lyrica.  Renew alprazolam (discussed not increasing dose further)  He will return to see me in 6 months or sooner if there are new or worsening neurologic symptoms.  Thank you for asking me to see Earl [name].  Please let me know if I can be of further assistance with him or other patients in the future.  Joshua Zeringue A. Felecia Shelling, MD, [facility] 97/41/6384, 53:64 AM Certified in Neurology, Clinical Neurophysiology, Sleep Medicine, Pain Medicine and Neuroimaging  [facility] [address], [place], [place] [address] [phone]

## 2021-12-16 ENCOUNTER — Ambulatory Visit (INDEPENDENT_AMBULATORY_CARE_PROVIDER_SITE_OTHER): Payer: Medicaid Other

## 2021-12-16 DIAGNOSIS — I480 Paroxysmal atrial fibrillation: Secondary | ICD-10-CM

## 2021-12-16 IMAGING — DX DG SHOULDER 2+V*R*
3 series · 3 of 3 positions shown · non-contrast
Comparison: Radiographs dated 01/07/2019 and 11/04/2012

CLINICAL DATA: Right shoulder and right clavicle pain secondary to
a fall 2 weeks ago.

EXAM:
RIGHT SHOULDER - 2+ VIEW

[shoulder grashey]
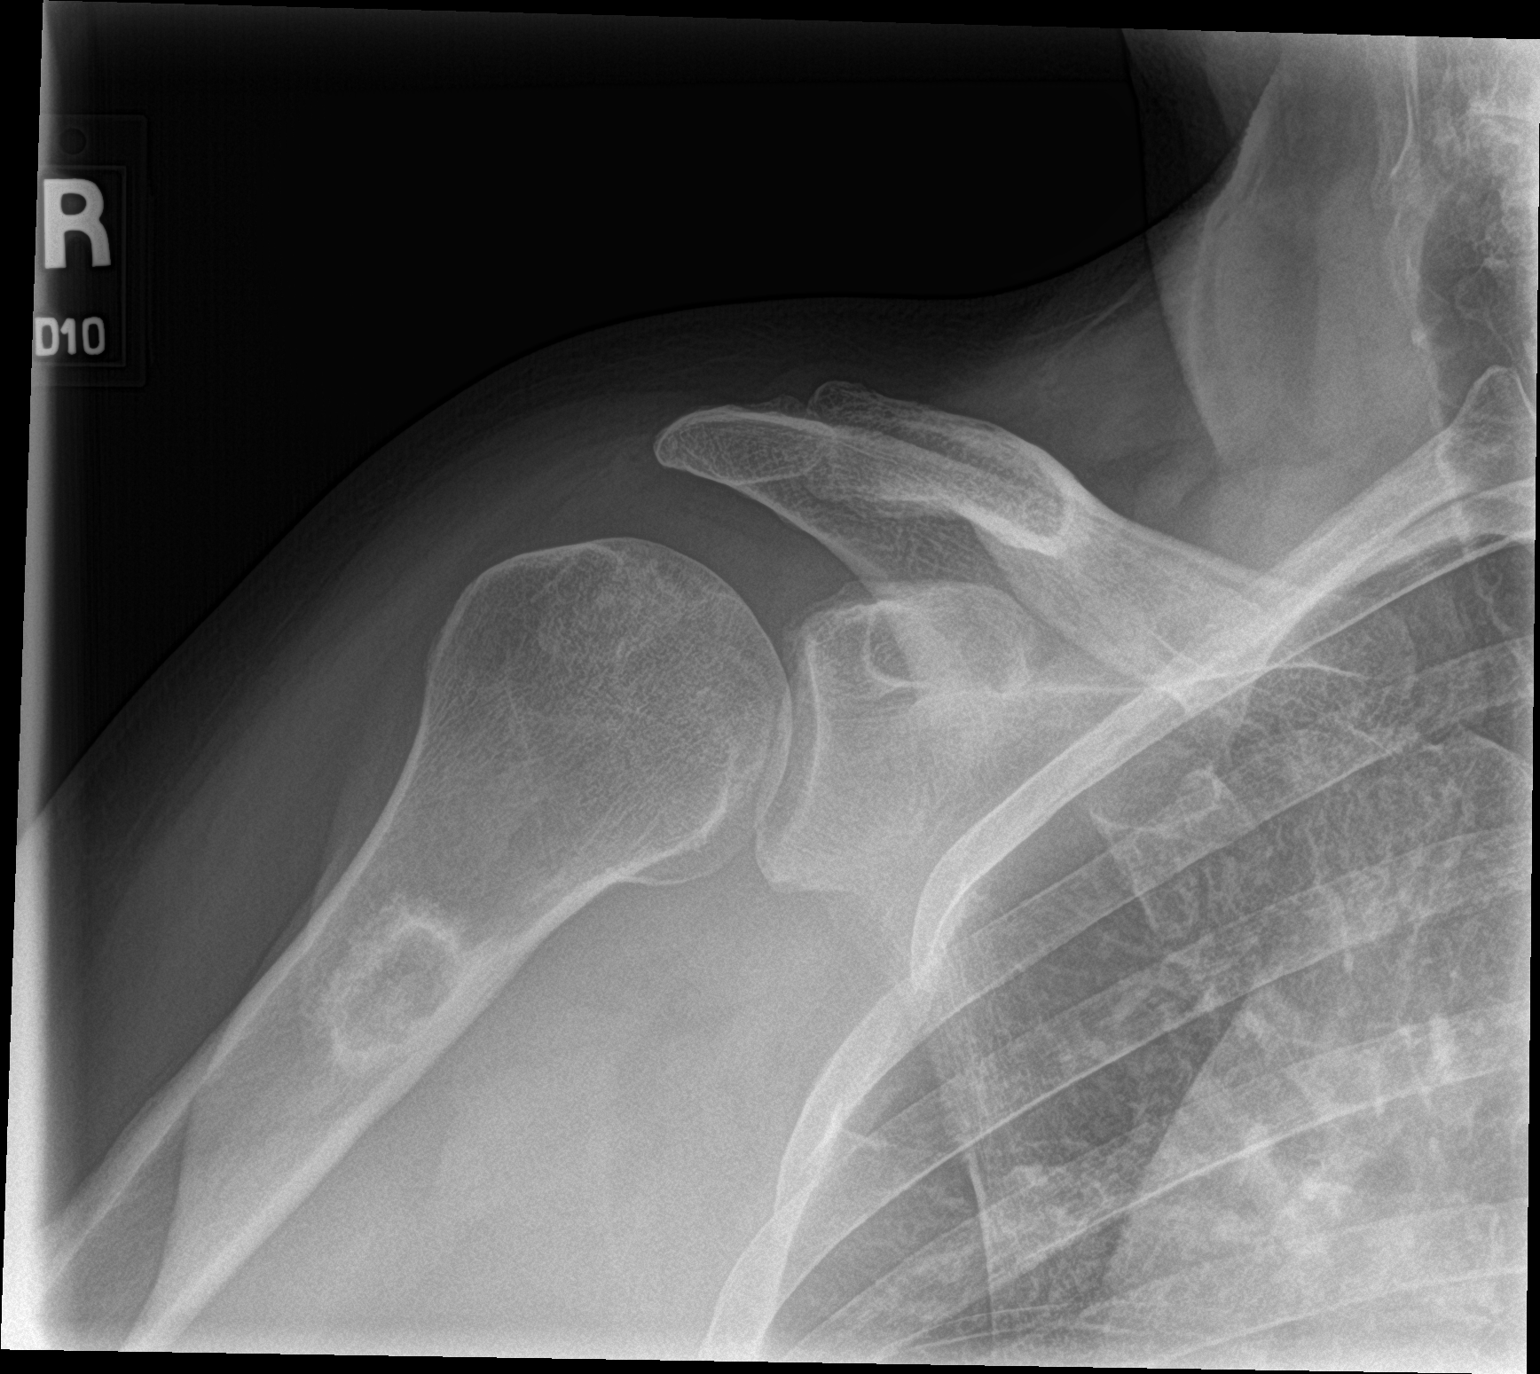

[shoulder y view]
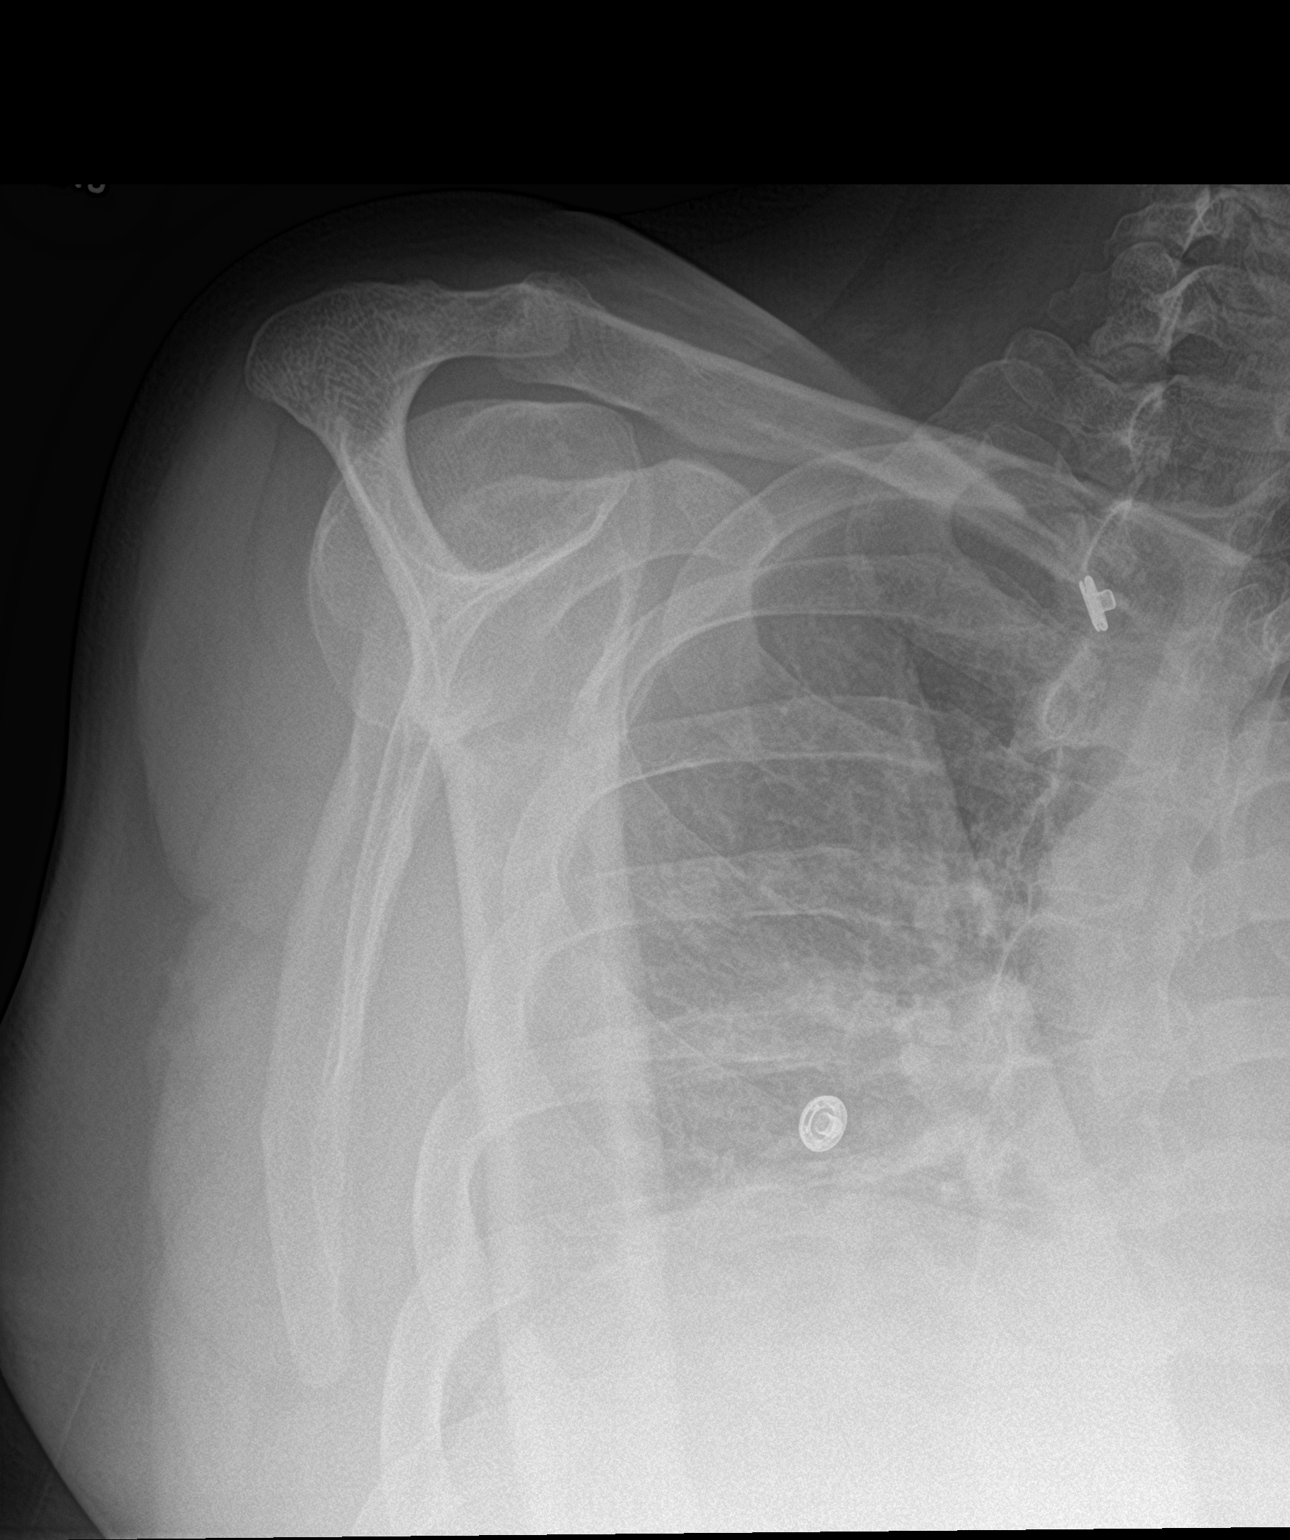

[shoulder axillary]
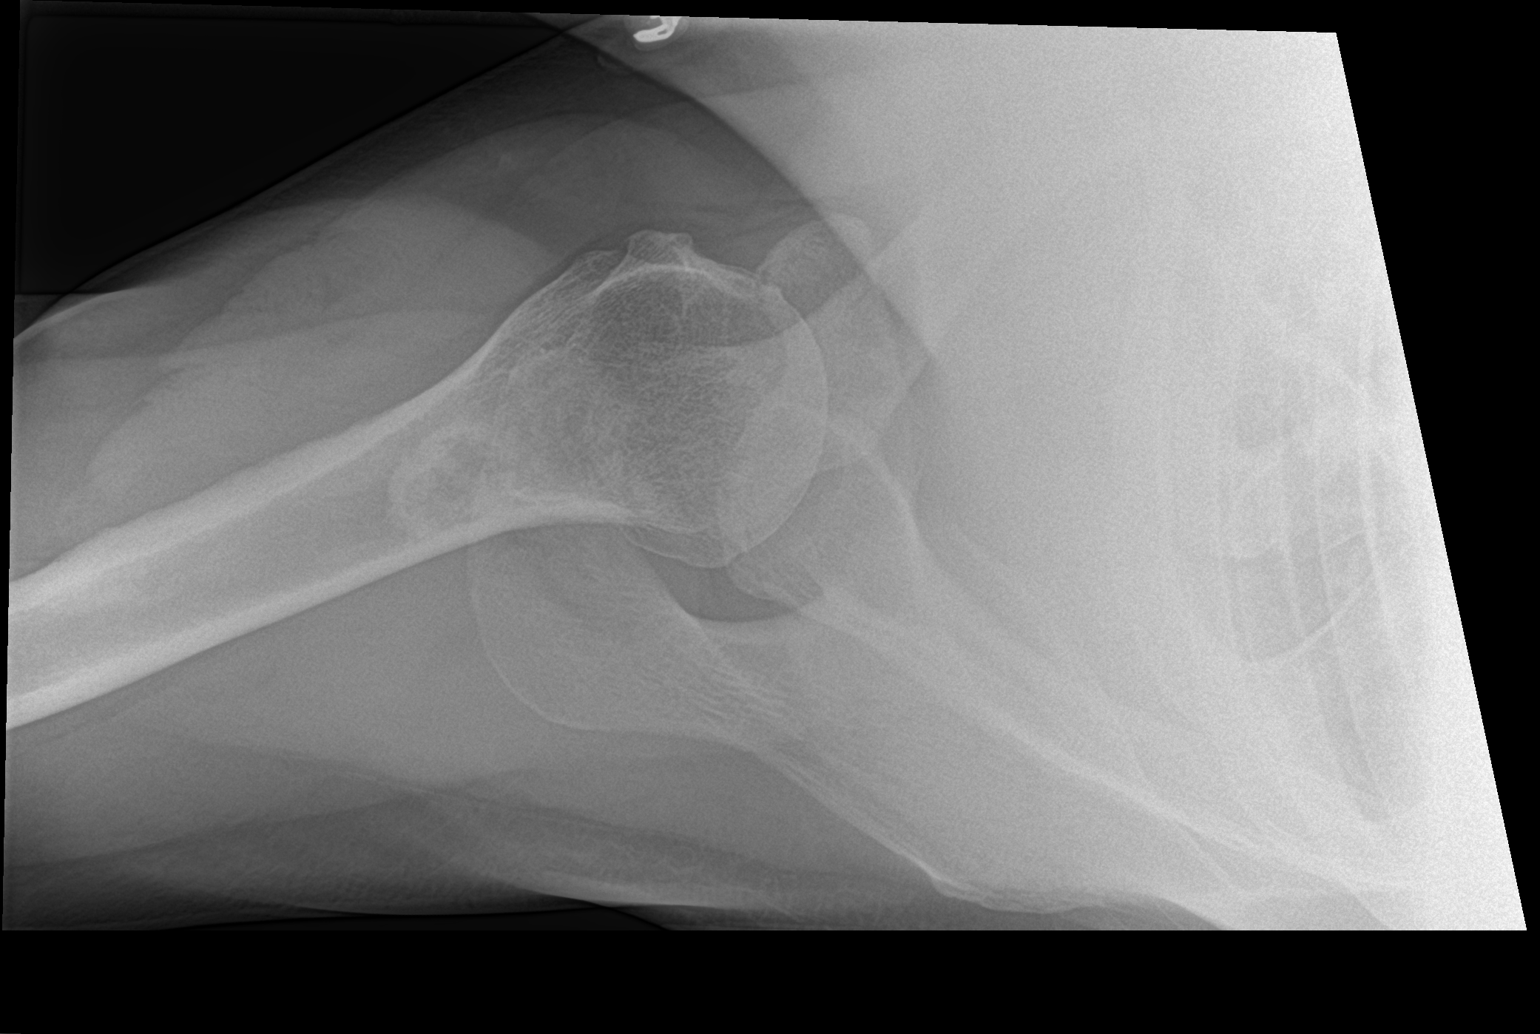

[3 of 3 positions shown; findings below may reference images not displayed]

FINDINGS: No fracture or dislocation or other acute bone abnormality. The
acromion the fibular joint appears normal. Humeral joint is normal.
Chronic stable benign-appearing mixed cystic and sclerotic lesion in
the proximal right humeral shaft measuring 2.8 cm in length is
unchanged since 8572 and consistent enchondroma.
IMPRESSION: No acute abnormality.

## 2021-12-16 IMAGING — DX DG CLAVICLE*R*
2 series · 2 of 2 positions shown · non-contrast
Comparison: None.

CLINICAL DATA: Right clavicle pain since a fall 2 weeks ago.

EXAM:
RIGHT CLAVICLE - 2+ VIEWS

[clavicle ap]
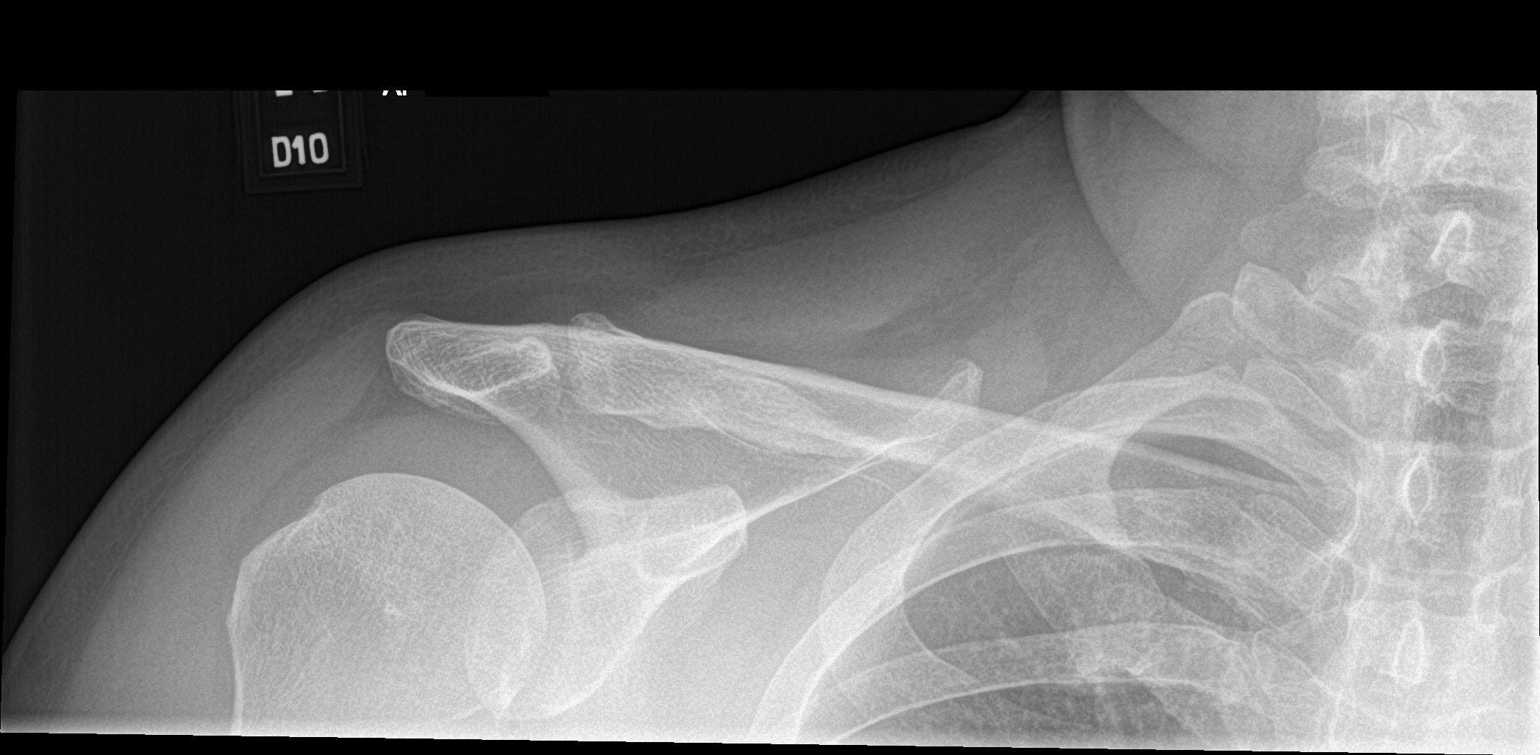

[clavicle axial]
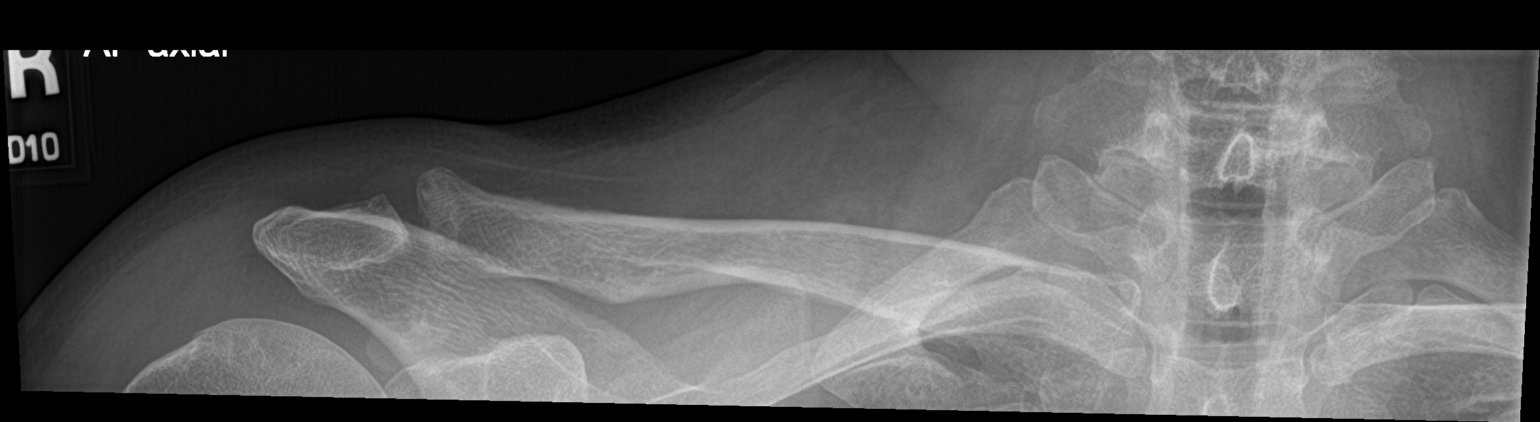

[2 of 2 positions shown; findings below may reference images not displayed]

FINDINGS: There is no fracture, dislocation, or other significant abnormality.
IMPRESSION: Negative.

## 2021-12-17 ENCOUNTER — Other Ambulatory Visit: Payer: Self-pay | Admitting: Family

## 2021-12-17 ENCOUNTER — Ambulatory Visit (HOSPITAL_COMMUNITY): Payer: Medicaid Other | Attending: Gastroenterology

## 2021-12-17 LAB — CUP PACEART REMOTE DEVICE CHECK
Date Time Interrogation Session: 20231119232328
Implantable Pulse Generator Implant Date: 20201228

## 2021-12-23 ENCOUNTER — Telehealth: Payer: Self-pay | Admitting: Cardiovascular Disease

## 2021-12-23 NOTE — Telephone Encounter (Signed)
Patient is requesting call back to possibly get referral for a gastro doctor.

## 2021-12-23 NOTE — Telephone Encounter (Signed)
Advised that Dr. Roseanne Kaufman office in [place] or [facility] would be other options. Advised that his PCP would also be able to recommend or refer to other GI specialist. Verbalized understanding.

## 2021-12-26 ENCOUNTER — Encounter: Payer: Self-pay | Admitting: Family

## 2021-12-26 ENCOUNTER — Telehealth: Payer: Self-pay | Admitting: Family

## 2021-12-26 ENCOUNTER — Ambulatory Visit: Payer: Medicaid Other | Admitting: Family

## 2021-12-26 DIAGNOSIS — E538 Deficiency of other specified B group vitamins: Secondary | ICD-10-CM

## 2021-12-26 DIAGNOSIS — Z6839 Body mass index (BMI) 39.0-39.9, adult: Secondary | ICD-10-CM

## 2021-12-26 DIAGNOSIS — I480 Paroxysmal atrial fibrillation: Secondary | ICD-10-CM

## 2021-12-26 DIAGNOSIS — E782 Mixed hyperlipidemia: Secondary | ICD-10-CM

## 2021-12-26 DIAGNOSIS — F1020 Alcohol dependence, uncomplicated: Secondary | ICD-10-CM

## 2021-12-26 DIAGNOSIS — G629 Polyneuropathy, unspecified: Secondary | ICD-10-CM

## 2021-12-26 DIAGNOSIS — I10 Essential (primary) hypertension: Secondary | ICD-10-CM

## 2021-12-26 DIAGNOSIS — E559 Vitamin D deficiency, unspecified: Secondary | ICD-10-CM | POA: Diagnosis not present

## 2021-12-26 DIAGNOSIS — E038 Other specified hypothyroidism: Secondary | ICD-10-CM

## 2021-12-26 DIAGNOSIS — K7031 Alcoholic cirrhosis of liver with ascites: Secondary | ICD-10-CM

## 2021-12-26 DIAGNOSIS — I8510 Secondary esophageal varices without bleeding: Secondary | ICD-10-CM

## 2021-12-26 DIAGNOSIS — I471 Supraventricular tachycardia, unspecified: Secondary | ICD-10-CM

## 2021-12-26 DIAGNOSIS — F339 Major depressive disorder, recurrent, unspecified: Secondary | ICD-10-CM

## 2021-12-26 NOTE — Progress Notes (Signed)
Virtual Visit  Note Due to COVID-19 pandemic this visit was conducted virtually. This visit type was conducted due to national recommendations for restrictions regarding the COVID-19 Pandemic (e.g. social distancing, sheltering in place) in an effort to limit this patient's exposure and mitigate transmission in our community. All issues noted in this document were discussed and addressed.  A physical exam was not performed with this format.  I connected with Earl [name] on 12/26/21 at 12:33 pm  by telephone and verified that I am speaking with the correct person using two identifiers. Earl [name] is currently located at home and no one is currently with him  during visit. The provider, Evelina Dun, FNP is located in their office at time of visit.  I discussed the limitations, risks, security and privacy concerns of performing an evaluation and management service by telephone and the availability of in person appointments. I also discussed with the patient that there may be a patient responsible charge related to this service. The patient expressed understanding and agreed to proceed.   .[name] are scheduled for a virtual visit with your provider today.    Just as we do with appointments in the office, we must obtain your consent to participate.  Your consent will be active for this visit and any virtual visit you may have with one of our providers in the next 365 days.    If you have a MyChart account, I can also send a copy of this consent to you electronically.  All virtual visits are billed to your insurance company just like a traditional visit in the office.  As this is a virtual visit, video technology does not allow for your provider to perform a traditional examination.  This may limit your provider's ability to fully assess your condition.  If your provider identifies any concerns that need to be evaluated in person or the need to arrange testing such as labs, EKG, etc, we  will make arrangements to do so.    Although advances in technology are sophisticated, we cannot ensure that it will always work on either your end or our end.  If the connection with a video visit is poor, we may have to switch to a telephone visit.  With either a video or telephone visit, we are not always able to ensure that we have a secure connection.   I need to obtain your verbal consent now.   Are you willing to proceed with your visit today?   Earl [name] has provided verbal consent on 12/26/2021 for a virtual visit (video or telephone).   Evelina Dun, [place] 12/26/2021  12:35 PM   History and Present Illness:  Pt calls to the office today for chronic follow up. He is followed by GI for alcoholic cirrhosis and malignant neoplasm of colon. He quit the Pain Clinic once a month for chronic pain and neuropathy. He is followed by Cardiologists for NSVT and atrial flutter. He is followed by Neurologists for neuropathy who gives him Lyrica.    He states he stopped drinking alcohol 12/09/21.   He is morbid obese with a BMI of 39 with hyperlipidemia and depression.    He is currently taking lasix 20 mg, but states he takes 40 mg as needed when he gains fluid. Hypertension This is a chronic problem. The current episode started more than 1 year ago. The problem has been resolved since onset. The problem is controlled. Associated symptoms include peripheral edema. Pertinent negatives  include no malaise/fatigue or shortness of breath. Risk factors for coronary artery disease include dyslipidemia, obesity and male gender. The current treatment provides moderate improvement. Hypertensive end-organ damage includes CAD/MI. Identifiable causes of hypertension include a thyroid problem.  Thyroid Problem Presents for follow-up visit. Symptoms include constipation, fatigue and hoarse voice. Patient reports no depressed mood. The symptoms have been stable. His past medical history is significant for  hyperlipidemia.  Hyperlipidemia This is a chronic problem. The current episode started more than 1 year ago. Exacerbating diseases include obesity. Pertinent negatives include no shortness of breath. Current antihyperlipidemic treatment includes diet change. The current treatment provides mild improvement of lipids. Risk factors for coronary artery disease include dyslipidemia, hypertension, male sex, a sedentary lifestyle and post-menopausal.  Depression        This is a chronic problem.  The current episode started more than 1 year ago.   The onset quality is gradual.   The problem occurs intermittently.  Associated symptoms include fatigue.  Past medical history includes thyroid problem.       Review of Systems  Constitutional:  Positive for fatigue. Negative for malaise/fatigue.  HENT:  Positive for hoarse voice.   Respiratory:  Negative for shortness of breath.   Gastrointestinal:  Positive for constipation.  Psychiatric/Behavioral:  Positive for depression.      Observations/Objective: No SOB or distress noted   Assessment and Plan: 1. SVT (supraventricular tachycardia)  2. Subclinical hypothyroidism  3. Vitamin B 12 deficiency  4. Vitamin D deficiency  5. Paroxysmal atrial fibrillation (H[facility])  6. Neuropathy  7. Morbid obesity ([place])  8. Mixed hyperlipidemia  9. ETOHism [facility]) - Ambulatory referral to Gastroenterology  10. Essential hypertension  11. Depression, recurrent ([place])   12. Alcoholic cirrhosis of liver with ascites ([place]) - Ambulatory referral to Gastroenterology  13. Secondary esophageal varices without bleeding ([place]) - Ambulatory referral to Gastroenterology  Labs reviewed New referral to GI placed  Discussed medications  Continue to avoid alcohol  Follow up 6 months    I discussed the assessment and treatment plan with the patient. The patient was provided an opportunity to ask questions and all were answered. The patient agreed with the plan  and demonstrated an understanding of the instructions.   The patient was advised to call back or seek an in-person evaluation if the symptoms worsen or if the condition fails to improve as anticipated.  The above assessment and management plan was discussed with the patient. The patient verbalized understanding of and has agreed to the management plan. Patient is aware to call the clinic if symptoms persist or worsen. Patient is aware when to return to the clinic for a follow-up visit. Patient educated on when it is appropriate to go to the emergency department.   Time call ended:  12:55 pm   I provided 22 minutes of  non face-to-face time during this encounter.    Evelina Dun, FNP

## 2021-12-27 ENCOUNTER — Encounter (INDEPENDENT_AMBULATORY_CARE_PROVIDER_SITE_OTHER): Payer: Self-pay | Admitting: Gastroenterology

## 2021-12-30 NOTE — Telephone Encounter (Signed)
Lmtcb.

## 2021-12-30 NOTE — Telephone Encounter (Signed)
Reviewed PCPs notes with pt. Pt says he can't take tylenol because he has a liver issue and can't take tylenol. Says he has not tried any ROM exercises. Pt says he has noticed that anytime he gets hungry, his head will start hurting. Says its almost like his head hurting is telling him that he is getting ready to feel hungry. Says the pain is in the back of his head.

## 2021-12-30 NOTE — Telephone Encounter (Signed)
Has he been taking Tylenol for for his pain? Encourage ROM exercises also.

## 2022-01-01 ENCOUNTER — Telehealth (INDEPENDENT_AMBULATORY_CARE_PROVIDER_SITE_OTHER): Payer: Self-pay | Admitting: *Deleted

## 2022-01-01 NOTE — Telephone Encounter (Signed)
Thanks for the update

## 2022-01-01 NOTE — Telephone Encounter (Signed)
Called patient to discuss the MyChart message that he mentions wanting to find a new GI MD.  He said he did and felt that the difference in the opinion of the colon cancer polyp and method of treatment and that he said he felt Dr. Laural Golden lead him correctly. I did explain that providers have different thoughts on how to treat things.  He understood but he just didn't;t like him talking about Dr. Laural Golden negative about not treating it correctly.  Told I understood his decision and we would make sure they got his records once he found another GI MD.    He will get his labs that are due him now and I told him that we wished him the best.  I also told him that I would be sending him a letter and we would cover him in the meantime for 30 days.  He is trying to get in with a GI in [place].  Patient was very polite and understanding of things discussed.

## 2022-01-01 NOTE — Telephone Encounter (Signed)
error 

## 2022-01-02 ENCOUNTER — Ambulatory Visit (HOSPITAL_COMMUNITY): Payer: Medicaid Other

## 2022-01-06 ENCOUNTER — Telehealth: Payer: Self-pay | Admitting: Family

## 2022-01-06 ENCOUNTER — Encounter (INDEPENDENT_AMBULATORY_CARE_PROVIDER_SITE_OTHER): Payer: Self-pay | Admitting: *Deleted

## 2022-01-07 LAB — AFP TUMOR MARKER: AFP-Tumor Marker: 3.8 ng/mL (ref <6.1)

## 2022-01-07 LAB — CEA: CEA: <2 ng/mL

## 2022-01-09 NOTE — Telephone Encounter (Signed)
Pt aware.

## 2022-01-21 ENCOUNTER — Ambulatory Visit (INDEPENDENT_AMBULATORY_CARE_PROVIDER_SITE_OTHER): Payer: Medicaid Other

## 2022-01-21 ENCOUNTER — Ambulatory Visit (HOSPITAL_COMMUNITY): Payer: Medicaid Other

## 2022-01-21 DIAGNOSIS — I480 Paroxysmal atrial fibrillation: Secondary | ICD-10-CM | POA: Diagnosis not present

## 2022-01-21 LAB — CUP PACEART REMOTE DEVICE CHECK
Date Time Interrogation Session: 20231225231359
Implantable Pulse Generator Implant Date: 20201228

## 2022-01-29 NOTE — Progress Notes (Signed)
Carelink Summary Report / Loop Recorder 

## 2022-02-10 ENCOUNTER — Ambulatory Visit (HOSPITAL_COMMUNITY): Payer: Medicaid Other

## 2022-02-13 NOTE — Progress Notes (Signed)
[facility] Summary Report / Loop Recorder

## 2022-02-20 ENCOUNTER — Encounter: Payer: Self-pay | Admitting: Family

## 2022-02-20 ENCOUNTER — Ambulatory Visit: Payer: Medicaid Other | Admitting: Family

## 2022-02-20 VITALS — BP 123/64 | HR 52 | Temp 97.7°F | Ht 70.0 in

## 2022-02-20 DIAGNOSIS — H938X3 Other specified disorders of ear, bilateral: Secondary | ICD-10-CM | POA: Diagnosis not present

## 2022-02-20 DIAGNOSIS — H6123 Impacted cerumen, bilateral: Secondary | ICD-10-CM | POA: Diagnosis not present

## 2022-02-20 NOTE — Patient Instructions (Signed)

## 2022-02-20 NOTE — Progress Notes (Signed)
   Subjective:    Patient ID: Earl [name], male    DOB: [birth year], 62 y.o.   MRN: [id number]  Chief Complaint  Patient presents with   Medical Management of Chronic Issues   PT presents to the office today with left ear fullness.  Ear Fullness  There is pain in the left ear. The current episode started 1 to 4 weeks ago. The problem occurs constantly. The problem has been unchanged. There has been no fever. The patient is experiencing no pain. Associated symptoms include hearing loss. Pertinent negatives include no coughing, diarrhea, sore throat or vomiting. He has tried nothing for the symptoms. The treatment provided mild relief.      Review of Systems  HENT:  Positive for hearing loss. Negative for sore throat.   Respiratory:  Negative for cough.   Gastrointestinal:  Negative for diarrhea and vomiting.  All other systems reviewed and are negative.      Objective:   Physical Exam Vitals reviewed.  Constitutional:      General: He is not in acute distress.    Appearance: He is well-developed.  HENT:     Head: Normocephalic.     Right Ear: There is impacted cerumen.     Left Ear: There is impacted cerumen.  Eyes:     General:        Right eye: No discharge.        Left eye: No discharge.     Pupils: Pupils are equal, round, and reactive to light.  Neck:     Thyroid: No thyromegaly.  Cardiovascular:     Rate and Rhythm: Normal rate and regular rhythm.     Heart sounds: Normal heart sounds. No murmur heard. Pulmonary:     Effort: Pulmonary effort is normal. No respiratory distress.     Breath sounds: Normal breath sounds. No wheezing.  Abdominal:     General: Bowel sounds are normal. There is no distension.     Palpations: Abdomen is soft.     Tenderness: There is no abdominal tenderness.  Musculoskeletal:        General: No tenderness. Normal range of motion.     Cervical back: Normal range of motion and neck supple.  Skin:    General: Skin is warm and dry.      Findings: No erythema or rash.  Neurological:     Mental Status: He is alert and oriented to person, place, and time.     Cranial Nerves: No cranial nerve deficit.     Deep Tendon Reflexes: Reflexes are normal and symmetric.  Psychiatric:        Behavior: Behavior normal.        Thought Content: Thought content normal.        Judgment: Judgment normal.    Bilateral ears washed with warm water and peroxide. TM WNL.   BP 123/64   Pulse (!) 52   Temp 97.7 F (36.5 C) (Temporal)   Ht '5\' 10"'$  (1.778 m)   SpO2 97%   BMI 40.18 kg/m       Assessment & Plan:  1. Sensation of fullness in both ears  2. Bilateral impacted cerumen  Bilateral ears washed with warm water. TM normal.  Use Debrox as needed  Evelina Dun, FNP

## 2022-02-21 ENCOUNTER — Ambulatory Visit (HOSPITAL_COMMUNITY)
Admission: RE | Admit: 2022-02-21 | Discharge: 2022-02-21 | Disposition: A | Discharge status: Home / Self Care | Payer: Medicaid Other | Source: Ambulatory Visit | Attending: Neurology | Admitting: Neurology

## 2022-02-21 DIAGNOSIS — G5132 Clonic hemifacial spasm, left: Secondary | ICD-10-CM

## 2022-02-21 MED ORDER — GADOBUTROL 1 MMOL/ML IV SOLN
10.0000 mL | Freq: Once | INTRAVENOUS | Status: AC | PRN
  Administered 2022-02-21: 10 mL via INTRAVENOUS

## 2022-02-24 ENCOUNTER — Ambulatory Visit: Payer: Medicaid Other

## 2022-02-24 DIAGNOSIS — I480 Paroxysmal atrial fibrillation: Secondary | ICD-10-CM | POA: Diagnosis not present

## 2022-02-25 ENCOUNTER — Other Ambulatory Visit: Payer: Self-pay | Admitting: Neurology

## 2022-02-25 DIAGNOSIS — G629 Polyneuropathy, unspecified: Secondary | ICD-10-CM

## 2022-02-25 DIAGNOSIS — E538 Deficiency of other specified B group vitamins: Secondary | ICD-10-CM

## 2022-02-25 DIAGNOSIS — F1020 Alcohol dependence, uncomplicated: Secondary | ICD-10-CM

## 2022-02-25 DIAGNOSIS — R259 Unspecified abnormal involuntary movements: Secondary | ICD-10-CM

## 2022-02-25 LAB — CUP PACEART REMOTE DEVICE CHECK
Date Time Interrogation Session: 20240127231509
Implantable Pulse Generator Implant Date: 20201228

## 2022-02-27 ENCOUNTER — Other Ambulatory Visit: Payer: Self-pay | Admitting: Family

## 2022-02-28 ENCOUNTER — Encounter (INDEPENDENT_AMBULATORY_CARE_PROVIDER_SITE_OTHER): Payer: Self-pay | Admitting: Gastroenterology

## 2022-02-28 ENCOUNTER — Telehealth: Payer: Self-pay | Admitting: Gastroenterology

## 2022-02-28 NOTE — Telephone Encounter (Signed)
Good Afternoon Dr. Lyndel Safe,  Supervising Provider 2/2 PM  I have this patient wishing to transfer care to [place] due to being unsatisfied with his previous provider, Dr Jenetta Downer with [facility]. Patient is just wanting to have an established doctor elsewhere and is looking to make a consultation appointment. His records are available in his chart, please review them and advise on scheduling.   Thank You!

## 2022-03-05 ENCOUNTER — Other Ambulatory Visit: Payer: Self-pay | Admitting: Neurology

## 2022-03-05 DIAGNOSIS — G629 Polyneuropathy, unspecified: Secondary | ICD-10-CM

## 2022-03-05 DIAGNOSIS — E538 Deficiency of other specified B group vitamins: Secondary | ICD-10-CM

## 2022-03-05 DIAGNOSIS — F1020 Alcohol dependence, uncomplicated: Secondary | ICD-10-CM

## 2022-03-05 DIAGNOSIS — R259 Unspecified abnormal involuntary movements: Secondary | ICD-10-CM

## 2022-03-12 NOTE — Telephone Encounter (Signed)
Patient called wanting to follow up on this request. Please advise.

## 2022-03-13 NOTE — Telephone Encounter (Signed)
OK for appt in [facility]

## 2022-03-17 ENCOUNTER — Telehealth (INDEPENDENT_AMBULATORY_CARE_PROVIDER_SITE_OTHER): Payer: Self-pay | Admitting: *Deleted

## 2022-03-17 NOTE — Telephone Encounter (Signed)
Patient is on recall for 1 year EGD, do I cancel recall since he has been discharged from practice

## 2022-03-17 NOTE — Telephone Encounter (Signed)
Yes, he is discharged from the practice. He needs to have this done with another provider outside of our practice. Thanks

## 2022-03-17 NOTE — Telephone Encounter (Signed)
Note made on recall, patient has been discharged from our practice

## 2022-03-21 ENCOUNTER — Encounter: Payer: Self-pay | Admitting: Nurse Practitioner

## 2022-03-25 ENCOUNTER — Ambulatory Visit: Payer: Medicaid Other | Admitting: Gastroenterology

## 2022-03-27 ENCOUNTER — Other Ambulatory Visit: Payer: Self-pay | Admitting: Family

## 2022-03-27 DIAGNOSIS — R259 Unspecified abnormal involuntary movements: Secondary | ICD-10-CM

## 2022-03-31 ENCOUNTER — Ambulatory Visit (INDEPENDENT_AMBULATORY_CARE_PROVIDER_SITE_OTHER): Payer: Medicaid Other

## 2022-03-31 DIAGNOSIS — R002 Palpitations: Secondary | ICD-10-CM

## 2022-03-31 LAB — CUP PACEART REMOTE DEVICE CHECK
Date Time Interrogation Session: 20240229231859
Implantable Pulse Generator Implant Date: 20201228

## 2022-04-09 NOTE — Progress Notes (Signed)
Carelink Summary Report / Loop Recorder 

## 2022-04-11 ENCOUNTER — Encounter: Payer: Self-pay | Admitting: Nurse Practitioner

## 2022-04-11 ENCOUNTER — Ambulatory Visit (INDEPENDENT_AMBULATORY_CARE_PROVIDER_SITE_OTHER): Payer: Medicaid Other | Admitting: Nurse Practitioner

## 2022-04-11 ENCOUNTER — Other Ambulatory Visit (INDEPENDENT_AMBULATORY_CARE_PROVIDER_SITE_OTHER): Payer: Medicaid Other

## 2022-04-11 VITALS — BP 126/74 | HR 72 | Ht 67.25 in | Wt 268.2 lb

## 2022-04-11 DIAGNOSIS — K7030 Alcoholic cirrhosis of liver without ascites: Secondary | ICD-10-CM

## 2022-04-11 DIAGNOSIS — K5900 Constipation, unspecified: Secondary | ICD-10-CM

## 2022-04-11 DIAGNOSIS — Z86010 Personal history of colonic polyps: Secondary | ICD-10-CM

## 2022-04-11 LAB — COMPREHENSIVE METABOLIC PANEL
ALT: 26 U/L (ref 0–53)
AST: 32 U/L (ref 0–37)
Albumin: 4 g/dL (ref 3.5–5.2)
Alkaline Phosphatase: 100 U/L (ref 39–117)
BUN: 14 mg/dL (ref 6–23)
CO2: 27 mEq/L (ref 19–32)
Calcium: 9.3 mg/dL (ref 8.4–10.5)
Chloride: 103 mEq/L (ref 96–112)
Creatinine, Ser: 1.09 mg/dL (ref 0.40–1.50)
GFR: 72.47 mL/min (ref >60.00)
Glucose, Bld: 102 mg/dL — ABNORMAL HIGH (ref 70–99)
Potassium: 3.8 mEq/L (ref 3.5–5.1)
Sodium: 140 mEq/L (ref 135–145)
Total Bilirubin: 0.8 mg/dL (ref 0.2–1.2)
Total Protein: 7.7 g/dL (ref 6.0–8.3)

## 2022-04-11 LAB — CBC WITH DIFFERENTIAL/PLATELET
Basophils Absolute: 0 10*3/uL (ref 0.0–0.1)
Basophils Relative: 0.6 % (ref 0.0–3.0)
Eosinophils Absolute: 0.1 10*3/uL (ref 0.0–0.7)
Eosinophils Relative: 2.4 % (ref 0.0–5.0)
HCT: 48.2 % (ref 39.0–52.0)
Hemoglobin: 16.6 g/dL (ref 13.0–17.0)
Lymphocytes Relative: 26.5 % (ref 12.0–46.0)
Lymphs Abs: 1.5 10*3/uL (ref 0.7–4.0)
MCHC: 34.4 g/dL (ref 30.0–36.0)
MCV: 94.6 fl (ref 78.0–100.0)
Monocytes Absolute: 0.6 10*3/uL (ref 0.1–1.0)
Monocytes Relative: 9.8 % (ref 3.0–12.0)
Neutro Abs: 3.4 10*3/uL (ref 1.4–7.7)
Neutrophils Relative %: 60.7 % (ref 43.0–77.0)
Platelets: 112 10*3/uL — ABNORMAL LOW (ref 150.0–400.0)
RBC: 5.09 Mil/uL (ref 4.22–5.81)
RDW: 14.3 % (ref 11.5–15.5)
WBC: 5.7 10*3/uL (ref 4.0–10.5)

## 2022-04-11 LAB — PROTIME-INR
INR: 1.1 ratio — ABNORMAL HIGH (ref 0.8–1.0)
Prothrombin Time: 11.8 s (ref 9.6–13.1)

## 2022-04-11 MED ORDER — LACTULOSE 20 GM/30ML PO SOLN: 300.0000 mL | Freq: Every evening | ORAL | 0 refills | Status: DC | PRN

## 2022-04-11 NOTE — Progress Notes (Addendum)
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 No history of hepatic encephalopathy. He was previously followed by Dr. Laural Golden and Dr. Cherlynn June in [place], however, he was recently discharged from their practice. He requested transfer of GI management with [facility], accepted by Dr. Lyndel Safe. He presents to our office today as referred by Evelina Dun NP for further evaluation regarding cirrhosis and esophageal varices.  He denies having any nausea or vomiting. No abdominal pain or swelling.  No GERD symptoms or dysphagia. No issues with confusion. He has chronic constipation and is passing a normal formed brown stool every few days.  He stated taking MiraLAX, Linzess and various other laxatives in the past which were ineffective or resulted in excessive diarrhea.  No rectal bleeding or black stools.  He previously drank two 24 ounce beers daily. He remains abstinent from alcohol since 12/23/2021.  Father died from cirrhosis unrelated to alcohol use.  EGD 07/25/2020 showed grade 1 and grade 2 esophageal varices, only 1 column was large enough to be banded and was completely eradicated. His most recent surveillance EGD was 04/11/2021 which identified grade 1 esophageal varices, portal hypertensive gastropathy and a benign appearing gastric polyp. Repeat surveillance EGD in 1 year was recommended.  He is not on a nonselective beta-blocker for varices.  He is on Atenolol for hypertension.  No NSAID use.  His most recent abdominal imaging was a CTAP without  contrast 01/17/2020 which showed evidence of cirrhosis with portal hypertension and small upper abdominal varices with resolution of previously seen ascites. He stated Dr. Jenetta Downer ordered abdominal sonogram recently but the patient elected not to schedule it.   He underwent a screening colonoscopy in 2018 which identified 1 polyp removed from the sigmoid colon, path report identified invasive adenocarcinoma within a tubular adenomatous polyp. CTAP was unremarkable and CEA level was normal.  Per Dr. Olevia Perches office notes, patient declined surgical resection. He underwent a surveillance colonoscopy 04/2017 and 2 small tubular adenomatous polyps were removed from the transverse and ascending colon.  His most recent colonoscopy was 07/25/2020 and 2 small tubular adenomatous polyps were removed from the splenic flexure.  At that time, a 5-year colonoscopy recall was recommended.     Latest Ref Rng & Units 11/25/2021    9:12 PM 06/27/2021   12:27 PM 03/06/2021   12:22 PM  CBC  WBC 4.0 - 10.5 K/uL 7.9  5.1  7.3   Hemoglobin 13.0 - 17.0 g/dL 15.6  16.0  14.6   Hematocrit 39.0 - 52.0 % 45.4  46.4  43.0   Platelets 150 - 400 K/uL 140  113  125        Latest Ref Rng & Units 11/25/2021    9:12 PM 06/27/2021   12:27 PM 03/06/2021   12:22 PM  CMP  Glucose 70 - 99 mg/dL 103  92  101   BUN 8 - 23 mg/dL 12  13  15    Creatinine 0.61 - 1.24 mg/dL 1.01  1.14  1.03   Sodium 135 - 145 mmol/L 135  140  140   Potassium 3.5 - 5.1 mmol/L 3.5  4.4  3.9   Chloride 98 - 111 mmol/L 106  105  102   CO2 22 - 32 mmol/L 20  24  22    Calcium 8.9 - 10.3 mg/dL 8.3  9.0  9.2   Total Protein 6.5 - 8.1 g/dL 7.7  7.5  6.8   Total Bilirubin 0.3 - 1.2 mg/dL 1.0  0.9  0.5   Alkaline Phos 38 - 126 U/L 94  117  115   AST 15 - 41 U/L 59  37  32   ALT 0 - 44 U/L 49  28  24     CTAP 01/17/2020: FINDINGS: Evaluation of this exam is limited in the absence of intravenous contrast.   Lower chest: The visualized lung bases are clear.    No intra-abdominal free air or free fluid.   Hepatobiliary: Cirrhosis. No intrahepatic biliary dilatation. No calcified gallstone or pericholecystic fluid.   Pancreas: Unremarkable. No pancreatic ductal dilatation or surrounding inflammatory changes.   Spleen: Top-normal spleen size measuring 13 cm in length.   Adrenals/Urinary Tract: The adrenal glands unremarkable. There is a 7 mm nonobstructing right renal upper pole calculus and a punctate nonobstructing right renal inferior pole stone. A 2 mm nonobstructing stone is also noted in the inferior pole of the left kidney. There is no hydronephrosis on either side. The visualized ureters appear unremarkable. The urinary bladder is collapsed. There is apparent diffuse thickening of the bladder wall which may be partly related to underdistention. Cystitis is not excluded. Correlation with urinalysis recommended.   Stomach/Bowel: Small scattered sigmoid diverticula without active inflammatory changes. There is no bowel obstruction or active inflammation. The appendix is normal.   Vascular/Lymphatic: Mild aortoiliac atherosclerotic disease. The IVC is unremarkable. No portal gas. There is mild diffuse mesenteric edema. Interval resolution of the previously seen ascites. Small upper abdominal varices noted.   Reproductive: The prostate and seminal vesicles are grossly unremarkable.   Other: Small fat containing umbilical hernia.   Musculoskeletal: No acute or significant osseous findings.   IMPRESSION: 1. Nonobstructing bilateral renal calculi. No hydronephrosis. 2. Cirrhosis with evidence of portal hypertension and small upper abdominal varices. Interval resolution of the previously seen ascites. 3. No bowel obstruction. Normal appendix. 4. Aortic Atherosclerosis    PAST GI PROCEDURES:  EGD 04/11/2021 by Dr. Laural Golden: - Normal hypopharynx.  - Normal proximal esophagus and mid esophagus.  - Scar in the distal esophagus.  -  Grade I esophageal varices.  - Z-line irregular, 40 cm from the incisors.  - Portal hypertensive gastropathy.  - A single gastric polyp. Appearance suggestive of hyperplastic polyp. It was left alone.  - Normal duodenal bulb and second portion of the duodenum. - No specimens collected.  -Repeat EGD in 1 year  EGD 07/25/2020: - Normal hypopharynx. - Normal proximal esophagus and mid esophagus. - Grade I and grade II esophageal varices. Only one column large enough to be banded. Completely eradicated. - Z-line irregular, 41 cm from the incisors. - Portal hypertensive gastropathy. - A few gastric polyps. - Normal duodenal bulb and second portion of the duodenum. - No specimens collected  Colonoscopy 07/25/2020: - Two 3 to 5 mm polyps at the splenic flexure, removed with a cold snare. Complete resection. Partial retrieval. - External and internal hemor - External and internal hemorrhoids - 5 Year colonoscopy recall A. COLON, SPLENIC FLEXURE, POLYPECTOMY:  - Tubular adenoma (s).  - No high-grade dysplasia or carcinoma.   Colonoscopy 05/07/2017: - Two small polyps in the transverse colon  and in the ascending colon. Biopsied.  - Post-polypectomy scar in the distal sigmoid colon.  - Internal hemorrhoids.  -3-year colonoscopy recall Colon, polyp(s), ascending and distal transverse - TUBULAR ADENOMA (X3 FRAGMENTS). - NO HIGH GRADE DYSPLASIA OR MALIGNANCY.  Colonoscopy 04/17/2016: -One 10 mm polyp in the distal sigmoid colon, removed with a hot snare.  Resected and retrieved. - Internal hemorrhoids. - ADENOCARCINOMA IN A TUBULAR ADENOMA WITH HIGH GRADE DYSPLASIA.  Past Medical History:  Diagnosis Date   Alcohol abuse    Anemia    Anxiety    Arthritis    Colon cancer ([place])    Depression    Dysrhythmia    Gall stones    Heart burn    History of kidney stones    Hyperlipidemia    Hypertension    Hypothyroidism    Liver disease    Tachycardia    Typical atrial flutter [facility])    Past  Surgical History:  Procedure Laterality Date   A-FLUTTER ABLATION N/A 07/31/2016   Procedure: A-Flutter Ablation;  Surgeon: [name], MD;  Location: [facility];  Service: Cardiovascular;  Laterality: N/A;   COLONOSCOPY N/A 04/17/2016   Procedure: COLONOSCOPY;  Surgeon: [name], MD;  [facility];  Service: Endoscopy;  Laterality: N/A;  1200   COLONOSCOPY N/A 05/07/2017   Procedure: COLONOSCOPY;  Surgeon: [name], MD;  [facility];  Service: Endoscopy;  Laterality: N/A;  200   COLONOSCOPY WITH PROPOFOL N/A 07/25/2020   Procedure: COLONOSCOPY WITH PROPOFOL;  Surgeon: [name], MD;  [facility];  Service: Endoscopy;  Laterality: N/A;  12:10   ESOPHAGEAL BANDING  07/25/2020   Procedure: ESOPHAGEAL BANDING;  Surgeon: [name], MD;  [facility];  Service: Endoscopy;;   ESOPHAGOGASTRODUODENOSCOPY (EGD) WITH PROPOFOL N/A 07/25/2020   Procedure: ESOPHAGOGASTRODUODENOSCOPY (EGD) WITH PROPOFOL;  Surgeon: [name], MD;  [facility];  Service: Endoscopy;  Laterality: N/A;   ESOPHAGOGASTRODUODENOSCOPY (EGD) WITH PROPOFOL N/A 04/11/2021   Procedure: ESOPHAGOGASTRODUODENOSCOPY (EGD) WITH PROPOFOL;  Surgeon: [name], MD;  [facility];  Service: Endoscopy;  Laterality: N/A;  1:00   implantable loop recorder placement  01/24/2019    Medtronic Reveal Linq model LNQ 11 implantable loop recorder (SN [id number] ) by Dr Rayann Heman for evaluation of palpitations   MINOR CARPAL TUNNEL     right   POLYPECTOMY  04/17/2016   Procedure: POLYPECTOMY;  Surgeon: [name], MD;  [facility];  Service: Endoscopy;;  sigmoid   POLYPECTOMY  07/25/2020   Procedure: POLYPECTOMY;  Surgeon: [name], MD;  [facility];  Service: Endoscopy;;   Social History: He quit smoking cigarettes 43 years ago.  He remains abstinent from alcohol since 12/08/2021.  No drug use.  Family  History: Father had heart and thyroid disease, died from cirrhosis without history of alcohol use disorder.  Maternal great had colon cancer diagnosed in her 87s.   Allergies  Allergen Reactions   Amitriptyline Anaphylaxis   Amoxicillin Anaphylaxis and Rash    Has patient had a PCN reaction causing immediate rash, facial/tongue/throat swelling, SOB or lightheadedness with hypotension: Yes Has patient had a PCN reaction causing severe rash involving mucus membranes or skin necrosis: No Has patient had a PCN reaction that required hospitalization: No Has patient had a PCN reaction occurring within the last 10 years: Yes If all of the above answers are "NO", then may  proceed with Cephalosporin use.    Asa [Aspirin] Anaphylaxis   Penicillin G Anaphylaxis   Clonidine Derivatives Other (See Comments)    Severe Dry Mouth   Hydrochlorothiazide     Caused rectal bleeding and hematuria   Levothyroxine Nausea And Vomiting    Patient states that the 75 mcg is too strong for him.   Trintellix [Vortioxetine] Nausea And Vomiting      Outpatient Encounter Medications as of 04/11/2022  Medication Sig   ALPRAZolam (XANAX) 1 MG tablet Take 1 tablet (1 mg total) by mouth daily as needed for anxiety. Must last 30 or more day before refill   atenolol (TENORMIN) 50 MG tablet TAKE 1 TABLET BY MOUTH DAILY   diltiazem (CARDIZEM CD) 300 MG 24 hr capsule TAKE ONE CAPSULE BY MOUTH DAILY   furosemide (LASIX) 40 MG tablet TAKE 1 TABLET BY MOUTH DAILY   LYRICA 200 MG capsule TAKE THREE CAPSULES BY MOUTH DAILY   ondansetron (ZOFRAN) 4 MG tablet TAKE 1 TABLET BY MOUTH TWICE DAILY AS NEEDED FOR NAUSEA OR FOR VOMITING   Oxcarbazepine (TRILEPTAL) 300 MG tablet Take 1 tablet (300 mg total) by mouth 2 (two) times daily.   pantoprazole (PROTONIX) 40 MG tablet TAKE 1 TABLET BY MOUTH DAILY BEFORE breakfast   No facility-administered encounter medications on file as of 04/11/2022.   REVIEW OF SYSTEMS:  Gen: Denies fever,  sweats or chills. No weight loss.  CV: Denies chest pain, palpitations or edema. Resp: Denies cough, shortness of breath of hemoptysis.  GI: See HPI. GU : Denies urinary burning, blood in urine, increased urinary frequency or incontinence. MS: Denies joint pain, muscles aches or weakness. Derm: Denies rash, itchiness, skin lesions or unhealing ulcers. Psych: Denies depression, anxiety, memory loss or confusion. Heme: Denies bruising, easy bleeding. Neuro:  Denies headaches, dizziness or paresthesias. Endo:  Denies any problems with DM, thyroid or adrenal function.  PHYSICAL EXAM: BP 126/74 (BP Location: Left Arm, Patient Position: Sitting, Cuff Size: Normal)   Pulse 72   Ht 5' 7.25" (1.708 m) Comment: height measured without shoes  Wt 268 lb 4 oz (121.7 kg)   BMI 41.70 kg/m   General: 62 year old male in no acute distress. Head: Normocephalic and atraumatic. Eyes:  Sclerae non-icteric, conjunctive pink. Ears: Normal auditory acuity. Mouth: Dentition intact. No ulcers or lesions.  Neck: Supple, no lymphadenopathy or thyromegaly.  Lungs: Clear bilaterally to auscultation without wheezes, crackles or rhonchi. Heart: Regular rate and rhythm. No murmur, rub or gallop appreciated.  Abdomen: Soft, nontender, nondistended. No masses. No hepatosplenomegaly. Normoactive bowel sounds x 4 quadrants.  Rectal: Deferred.  Musculoskeletal: Symmetrical with no gross deformities. Skin: Warm and dry. No rash or lesions on visible extremities. Extremities: No edema. Neurological: Alert oriented x 4, no focal deficits.  No asterixis. Psychological:  Alert and cooperative. Normal mood and affect.  ASSESSMENT AND PLAN:  62 year old male with decompensated cirrhosis secondary to alcohol + steatotic liver disease with portal hypertension, esophageal varices and prior ascites (s/p paracentesis without SBP 06/2019).  No history of or current overt signs of hepatic encephalopathy. -Surveillance EGD  benefits and risks discussed including risk with sedation, risk of bleeding, perforation and infection. Addendum: EGD with Dr. Lyndel Safe scheduled at [facility] 05/08/2022. -If he has recurrence of esophageal varices, I recommend contacting his cardiologist or provider who prescribes Atenolol to verify if we can switch it to a nonselective beta-blocker versus Carvedilol -RUQ sonogram  -CBC, CMP, PT/INR, AFP.  Calculate MELD score  after lab results reviewed. -No NSAIDs -2 g low-sodium diet -Continued alcohol abstinence encouraged  History of a 49mm invasive adenocarcinoma within a tubular adenomatous polyp with clear margins to the sigmoid colon per colonoscopy 03/2016. His most recent surveillance colonoscopy was 07/25/2020 and two 3-5 tubular adenomatous polyps were removed at the splenic flexure.  Recall colonoscopy in 5 years. -Dr. Lyndel Safe to further verify colonoscopy recall date  Chronic constipation -Lactulose 20 g p.o. nightly as needed  History of atrial flutter, not on anticoagulation.  On Atenolol and Diltiazem.        CC:  Sharion Balloon, FNP

## 2022-04-11 NOTE — H&P (View-Only) (Signed)
   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 No history of hepatic encephalopathy. He was previously followed by Earl [name] and [name] in [place], however, he was recently discharged from their practice. He requested transfer of GI management with [name], accepted by Earl [name]. He presents to our office today as referred by Earl Hawks NP for further evaluation regarding cirrhosis and esophageal varices.  He denies having any nausea or vomiting. No abdominal pain or swelling.  No GERD symptoms or dysphagia. No issues with confusion. He has chronic constipation and is passing a normal formed brown stool every few days.  He stated taking MiraLAX, Linzess and various other laxatives in the past which were ineffective or resulted in excessive diarrhea.  No rectal bleeding or black stools.  He previously drank two 24 ounce beers daily. He remains abstinent from alcohol since 12/08/2021.  Father died from cirrhosis unrelated to alcohol use.  EGD 07/25/2020 showed grade 1 and grade 2 esophageal varices, only 1 column was large enough to be banded and was completely eradicated. His most recent surveillance EGD was 04/11/2021 which identified grade 1 esophageal varices, portal hypertensive gastropathy and a benign appearing gastric polyp. Repeat surveillance EGD in 1 year was recommended.  He is not on a nonselective beta-blocker for varices.  He is on Atenolol for hypertension.  No NSAID use.  His most recent abdominal imaging was a CTAP without  contrast 01/17/2020 which showed evidence of cirrhosis with portal hypertension and small upper abdominal varices with resolution of previously seen ascites. He stated Dr. Castaneda ordered abdominal sonogram recently but the patient elected not to schedule it.   He underwent a screening colonoscopy in 2018 which identified 1 polyp removed from the sigmoid colon, path report identified invasive adenocarcinoma within a tubular adenomatous polyp. CTAP was unremarkable and CEA level was normal.  Per Earl [name]'s office notes, patient declined surgical resection. He underwent a surveillance colonoscopy 04/2017 and 2 small tubular adenomatous polyps were removed from the transverse and ascending colon.  His most recent colonoscopy was 07/25/2020 and 2 small tubular adenomatous polyps were removed from the splenic flexure.  At that time, a 5-year colonoscopy recall was recommended.     Latest Ref Rng & Units 11/25/2021    9:12 PM 06/27/2021   12:27 PM 03/06/2021   12:22 PM  CBC  WBC 4.0 - 10.5 K/uL 7.9  5.1  7.3   Hemoglobin 13.0 - 17.0 g/dL 15.6  16.0  14.6   Hematocrit 39.0 - 52.0 % 45.4  46.4  43.0   Platelets 150 - 400 K/uL 140  113  125        Latest Ref Rng & Units 11/25/2021    9:12 PM 06/27/2021   12:27 PM 03/06/2021   12:22 PM  CMP  Glucose 70 - 99 mg/dL 103  92  101   BUN 8 - 23 mg/dL 12  13  15   Creatinine 0.61 - 1.24 mg/dL 1.01  1.14  1.03   Sodium 135 - 145 mmol/L 135  140  140   Potassium 3.5 - 5.1 mmol/L 3.5    4.4  3.9   Chloride 98 - 111 mmol/L 106  105  102   CO2 22 - 32 mmol/L 20  24  22   Calcium 8.9 - 10.3 mg/dL 8.3  9.0  9.2   Total Protein 6.5 - 8.1 g/dL 7.7  7.5  6.8   Total Bilirubin 0.3 - 1.2 mg/dL 1.0  0.9  0.5   Alkaline Phos 38 - 126 U/L 94  117  115   AST 15 - 41 U/L 59  37  32   ALT 0 - 44 U/L 49  28  24     CTAP 01/17/2020: FINDINGS: Evaluation of this exam is limited in the absence of intravenous contrast.   Lower chest: The visualized lung bases are clear.    No intra-abdominal free air or free fluid.   Hepatobiliary: Cirrhosis. No intrahepatic biliary dilatation. No calcified gallstone or pericholecystic fluid.   Pancreas: Unremarkable. No pancreatic ductal dilatation or surrounding inflammatory changes.   Spleen: Top-normal spleen size measuring 13 cm in length.   Adrenals/Urinary Tract: The adrenal glands unremarkable. There is a 7 mm nonobstructing right renal upper pole calculus and a punctate nonobstructing right renal inferior pole stone. A 2 mm nonobstructing stone is also noted in the inferior pole of the left kidney. There is no hydronephrosis on either side. The visualized ureters appear unremarkable. The urinary bladder is collapsed. There is apparent diffuse thickening of the bladder wall which may be partly related to underdistention. Cystitis is not excluded. Correlation with urinalysis recommended.   Stomach/Bowel: Small scattered sigmoid diverticula without active inflammatory changes. There is no bowel obstruction or active inflammation. The appendix is normal.   Vascular/Lymphatic: Mild aortoiliac atherosclerotic disease. The IVC is unremarkable. No portal gas. There is mild diffuse mesenteric edema. Interval resolution of the previously seen ascites. Small upper abdominal varices noted.   Reproductive: The prostate and seminal vesicles are grossly unremarkable.   Other: Small fat containing umbilical hernia.   Musculoskeletal: No acute or significant osseous findings.   IMPRESSION: 1. Nonobstructing bilateral renal calculi. No hydronephrosis. 2. Cirrhosis with evidence of portal hypertension and small upper abdominal varices. Interval resolution of the previously seen ascites. 3. No bowel obstruction. Normal appendix. 4. Aortic Atherosclerosis    PAST GI PROCEDURES:  EGD 04/11/2021 by Earl [name]: - Normal hypopharynx.  - Normal proximal esophagus and mid esophagus.  - Scar in the distal esophagus.  -  Grade I esophageal varices.  - Z-line irregular, 40 cm from the incisors.  - Portal hypertensive gastropathy.  - A single gastric polyp. Appearance suggestive of hyperplastic polyp. It was left alone.  - Normal duodenal bulb and second portion of the duodenum. - No specimens collected.  -Repeat EGD in 1 year  EGD 07/25/2020: - Normal hypopharynx. - Normal proximal esophagus and mid esophagus. - Grade I and grade II esophageal varices. Only one column large enough to be banded. Completely eradicated. - Z-line irregular, 41 cm from the incisors. - Portal hypertensive gastropathy. - A few gastric polyps. - Normal duodenal bulb and second portion of the duodenum. - No specimens collected  Colonoscopy 07/25/2020: - Two 3 to 5 mm polyps at the splenic flexure, removed with a cold snare. Complete resection. Partial retrieval. - External and internal hemor - External and internal hemorrhoids - 5 Year colonoscopy recall A. COLON, SPLENIC FLEXURE, POLYPECTOMY:  - Tubular adenoma (s).  - No high-grade dysplasia or carcinoma.   Colonoscopy 05/07/2017: - Two small polyps in the transverse colon   and in the ascending colon. Biopsied.  - Post-polypectomy scar in the distal sigmoid colon.  - Internal hemorrhoids.  -3-year colonoscopy recall Colon, polyp(s), ascending and distal transverse - TUBULAR ADENOMA (X3 FRAGMENTS). - NO HIGH GRADE DYSPLASIA OR MALIGNANCY.  Colonoscopy 04/17/2016: -One 10 mm polyp in the distal sigmoid colon, removed with a hot snare.  Resected and retrieved. - Internal hemorrhoids. - ADENOCARCINOMA IN A TUBULAR ADENOMA WITH HIGH GRADE DYSPLASIA.  Past Medical History:  Diagnosis Date   Alcohol abuse    Anemia    Anxiety    Arthritis    Colon cancer (HCC)    Depression    Dysrhythmia    Gall stones    Heart burn    History of kidney stones    Hyperlipidemia    Hypertension    Hypothyroidism    Liver disease    Tachycardia    Typical atrial flutter (HCC)    Past  Surgical History:  Procedure Laterality Date   A-FLUTTER ABLATION N/A 07/31/2016   Procedure: A-Flutter Ablation;  Surgeon: Allred, James, MD;  Location: [facility];  Service: Cardiovascular;  Laterality: N/A;   COLONOSCOPY N/A 04/17/2016   Procedure: COLONOSCOPY;  Surgeon: Najeeb U Rehman, MD;  Location: [facility];  Service: Endoscopy;  Laterality: N/A;  1200   COLONOSCOPY N/A 05/07/2017   Procedure: COLONOSCOPY;  Surgeon: [name], Najeeb U, MD;  Location: [facility];  Service: Endoscopy;  Laterality: N/A;  200   COLONOSCOPY WITH PROPOFOL N/A 07/25/2020   Procedure: COLONOSCOPY WITH PROPOFOL;  Surgeon: [name], Najeeb U, MD;  Location: [facility];  Service: Endoscopy;  Laterality: N/A;  12:10   ESOPHAGEAL BANDING  07/25/2020   Procedure: ESOPHAGEAL BANDING;  Surgeon: [name], Najeeb U, MD;  Location: [facility];  Service: Endoscopy;;   ESOPHAGOGASTRODUODENOSCOPY (EGD) WITH PROPOFOL N/A 07/25/2020   Procedure: ESOPHAGOGASTRODUODENOSCOPY (EGD) WITH PROPOFOL;  Surgeon: [name], Najeeb U, MD;  Location: [facility];  Service: Endoscopy;  Laterality: N/A;   ESOPHAGOGASTRODUODENOSCOPY (EGD) WITH PROPOFOL N/A 04/11/2021   Procedure: ESOPHAGOGASTRODUODENOSCOPY (EGD) WITH PROPOFOL;  Surgeon: [name], Najeeb U, MD;  Location: [facility];  Service: Endoscopy;  Laterality: N/A;  1:00   implantable loop recorder placement  01/24/2019    Medtronic Reveal Linq model LNQ 11 implantable loop recorder (SN [id number] ) by Dr Allred for evaluation of palpitations   MINOR CARPAL TUNNEL     right   POLYPECTOMY  04/17/2016   Procedure: POLYPECTOMY;  Surgeon: Najeeb U Rehman, MD;  [facility]: [facility];  Service: Endoscopy;;  sigmoid   POLYPECTOMY  07/25/2020   Procedure: POLYPECTOMY;  Surgeon: [name], Najeeb U, MD;  Location: [facility];  Service: Endoscopy;;   Social History: He quit smoking cigarettes 43 years ago.  He remains abstinent from alcohol since 12/08/2021.  No drug use.  Family  History: Father had heart and thyroid disease, died from cirrhosis without history of alcohol use disorder.  Maternal great had colon cancer diagnosed in her 70s.   Allergies  Allergen Reactions   Amitriptyline Anaphylaxis   Amoxicillin Anaphylaxis and Rash    Has patient had a PCN reaction causing immediate rash, facial/tongue/throat swelling, SOB or lightheadedness with hypotension: Yes Has patient had a PCN reaction causing severe rash involving mucus membranes or skin necrosis: No Has patient had a PCN reaction that required hospitalization: No Has patient had a PCN reaction occurring within the last 10 years: Yes If all of the above answers are "NO", then may   proceed with Cephalosporin use.    Asa [Aspirin] Anaphylaxis   Penicillin G Anaphylaxis   Clonidine Derivatives Other (See Comments)    Severe Dry Mouth   Hydrochlorothiazide     Caused rectal bleeding and hematuria   Levothyroxine Nausea And Vomiting    Patient states that the 75 mcg is too strong for him.   Trintellix [Vortioxetine] Nausea And Vomiting      Outpatient Encounter Medications as of 04/11/2022  Medication Sig   ALPRAZolam (XANAX) 1 MG tablet Take 1 tablet (1 mg total) by mouth daily as needed for anxiety. Must last 30 or more day before refill   atenolol (TENORMIN) 50 MG tablet TAKE 1 TABLET BY MOUTH DAILY   diltiazem (CARDIZEM CD) 300 MG 24 hr capsule TAKE ONE CAPSULE BY MOUTH DAILY   furosemide (LASIX) 40 MG tablet TAKE 1 TABLET BY MOUTH DAILY   LYRICA 200 MG capsule TAKE THREE CAPSULES BY MOUTH DAILY   ondansetron (ZOFRAN) 4 MG tablet TAKE 1 TABLET BY MOUTH TWICE DAILY AS NEEDED FOR NAUSEA OR FOR VOMITING   Oxcarbazepine (TRILEPTAL) 300 MG tablet Take 1 tablet (300 mg total) by mouth 2 (two) times daily.   pantoprazole (PROTONIX) 40 MG tablet TAKE 1 TABLET BY MOUTH DAILY BEFORE breakfast   No facility-administered encounter medications on file as of 04/11/2022.   REVIEW OF SYSTEMS:  Gen: Denies fever,  sweats or chills. No weight loss.  CV: Denies chest pain, palpitations or edema. Resp: Denies cough, shortness of breath of hemoptysis.  GI: See HPI. GU : Denies urinary burning, blood in urine, increased urinary frequency or incontinence. MS: Denies joint pain, muscles aches or weakness. Derm: Denies rash, itchiness, skin lesions or unhealing ulcers. Psych: Denies depression, anxiety, memory loss or confusion. Heme: Denies bruising, easy bleeding. Neuro:  Denies headaches, dizziness or paresthesias. Endo:  Denies any problems with DM, thyroid or adrenal function.  PHYSICAL EXAM: BP 126/74 (BP Location: Left Arm, Patient Position: Sitting, Cuff Size: Normal)   Pulse 72   Ht 5' 7.25" (1.708 m) Comment: height measured without shoes  Wt 268 lb 4 oz (121.7 kg)   BMI 41.70 kg/m   General: 62-year-old male in no acute distress. Head: Normocephalic and atraumatic. Eyes:  Sclerae non-icteric, conjunctive pink. Ears: Normal auditory acuity. Mouth: Dentition intact. No ulcers or lesions.  Neck: Supple, no lymphadenopathy or thyromegaly.  Lungs: Clear bilaterally to auscultation without wheezes, crackles or rhonchi. Heart: Regular rate and rhythm. No murmur, rub or gallop appreciated.  Abdomen: Soft, nontender, nondistended. No masses. No hepatosplenomegaly. Normoactive bowel sounds x 4 quadrants.  Rectal: Deferred.  Musculoskeletal: Symmetrical with no gross deformities. Skin: Warm and dry. No rash or lesions on visible extremities. Extremities: No edema. Neurological: Alert oriented x 4, no focal deficits.  No asterixis. Psychological:  Alert and cooperative. Normal mood and affect.  ASSESSMENT AND PLAN:  62 year old male with decompensated cirrhosis secondary to alcohol + steatotic liver disease with portal hypertension, esophageal varices and prior ascites (s/p paracentesis without SBP 06/2019).  No history of or current overt signs of hepatic encephalopathy. -Surveillance EGD  benefits and risks discussed including risk with sedation, risk of bleeding, perforation and infection. Addendum: EGD with Earl [name] scheduled at [facility] 05/08/2022. -If he has recurrence of esophageal varices, I recommend contacting his cardiologist or provider who prescribes Atenolol to verify if we can switch it to a nonselective beta-blocker versus Carvedilol -RUQ sonogram  -CBC, CMP, PT/INR, AFP.  Calculate MELD score   after lab results reviewed. -No NSAIDs -2 g low-sodium diet -Continued alcohol abstinence encouraged  History of a 10mm invasive adenocarcinoma within a tubular adenomatous polyp with clear margins to the sigmoid colon per colonoscopy 03/2016. His most recent surveillance colonoscopy was 07/25/2020 and two 3-5 tubular adenomatous polyps were removed at the splenic flexure.  Recall colonoscopy in 5 years. -Earl [name] to further verify colonoscopy recall date  Chronic constipation -Lactulose 20 g p.o. nightly as needed  History of atrial flutter, not on anticoagulation.  On Atenolol and Diltiazem.        CC:  [name], Earl A, FNP    

## 2022-04-11 NOTE — Patient Instructions (Addendum)
You have been scheduled for an endoscopy. Please follow written instructions given to you at your visit today. If you use inhalers (even only as needed), please bring them with you on the day of your procedure.  Your provider has requested that you go to the basement level for lab work before leaving today. Press "B" on the elevator. The lab is located at the first door on the left as you exit the elevator.  You have been scheduled for an abdominal ultrasound at [facility] (1st floor of hospital) on 04/18/22 at 9 am. Please arrive 15 minutes prior to your appointment for registration. Make certain not to have anything to eat or drink 6 hours prior to your appointment. Should you need to reschedule your appointment, please contact radiology at [phone]. This test typically takes about 30 minutes to perform.   Remain abstained from alcohol.  Lactulose 20 mg every night as needed  Due to recent changes in healthcare laws, you may see the results of your imaging and laboratory studies on MyChart before your provider has had a chance to review them.  We understand that in some cases there may be results that are confusing or concerning to you. Not all laboratory results come back in the same time frame and the provider may be waiting for multiple results in order to interpret others.  Please give [place] 48 hours in order for your provider to thoroughly review all the results before contacting the office for clarification of your results.   Thank you for trusting me with your gastrointestinal care!   Carl Best, CRNP

## 2022-04-14 LAB — AFP TUMOR MARKER: AFP-Tumor Marker: 6.4 ng/mL — ABNORMAL HIGH (ref <6.1)

## 2022-04-15 ENCOUNTER — Telehealth: Payer: Self-pay | Admitting: Nurse Practitioner

## 2022-04-15 ENCOUNTER — Other Ambulatory Visit: Payer: Self-pay | Admitting: Nurse Practitioner

## 2022-04-15 NOTE — Telephone Encounter (Signed)
Patient is calling states he only received the Lactulose Rx but was not able to receive the other two that were talked about during his appointment and would like to speak with somebody about getting them prescribed. Please advise

## 2022-04-15 NOTE — Telephone Encounter (Signed)
Contacted pt & pt states he was told that he was told that a drug for cirrhosis was supposed to be sent to the pharmacy and was never sent. Please advise.

## 2022-04-15 NOTE — Telephone Encounter (Signed)
Earl [name], I am sending you this message as it is a bit complex, the only medication that was prescribed was Lactulose which was for constipation and not for his cirrhosis (he does not have hepatic encephalopathy).   As far as what other cirrhosis medication, I mentioned if his future EGD showed any evidence of esophageal varices that we may need to contact his cardiologist to see if we can switch his atenolol to a nonselective beta blocker which reduces the risk of esophageal variceal bleeding. We will make further recommendations after EGD completed. Refer to office visit notes.   He was previously prescribed Lasix 40mg  po QD PRN by his prior gastroenterologist which he as taking PRN for abd or leg swelling. Pls verify if the patient is need RX for Lasix. THX

## 2022-04-16 NOTE — Telephone Encounter (Signed)
Pt notified of Earl Best NP recommendations for the medications . Pt stated that he is clear now and may have misunderstood what was told to him prior.  Pt stated that he does not take the Lasix very often and that he thinks his PCP prescribes that for him now. Pt verbalized understanding with all questions answered.

## 2022-04-18 ENCOUNTER — Ambulatory Visit (HOSPITAL_COMMUNITY): Payer: Medicaid Other

## 2022-04-20 NOTE — Progress Notes (Signed)
Earl [name], Lets get EGD done at [facility] with possible EVL (at the same time) RG

## 2022-04-20 NOTE — Progress Notes (Signed)
Remo Lipps, pls contact patient and cancel his EGD at [facility] and rescheduled him for an EGD with Dr. Lyndel Safe at [facility] as he might need his esophageal  varices banded. Pls let me know when rescheduled. THX.

## 2022-04-21 ENCOUNTER — Telehealth: Payer: Self-pay | Admitting: Nurse Practitioner

## 2022-04-21 ENCOUNTER — Other Ambulatory Visit: Payer: Self-pay

## 2022-04-21 ENCOUNTER — Telehealth: Payer: Self-pay

## 2022-04-21 DIAGNOSIS — K7030 Alcoholic cirrhosis of liver without ascites: Secondary | ICD-10-CM

## 2022-04-21 DIAGNOSIS — I8501 Esophageal varices with bleeding: Secondary | ICD-10-CM

## 2022-04-21 DIAGNOSIS — I8500 Esophageal varices without bleeding: Secondary | ICD-10-CM

## 2022-04-21 NOTE — Telephone Encounter (Signed)
Instruction sent to patient via mychart for EGD

## 2022-04-21 NOTE — Telephone Encounter (Signed)
Pt made aware of Carl Best NP and Dr. Lyndel Safe recommendations:  EGD was canceled in the [facility]. Pt made aware. EGD with Dr. Lyndel Safe  was scheduled at [facility] on 05/08/2022 at 11:15 AM . Pt to arrive at 9:45 AM. Pt made aware. Prep instructions were created for pt and sent via My.Chart.  Pt made aware. Ambulatory referral to GI placed in Epic.  Pt verbalized understanding with all questions answered.

## 2022-04-21 NOTE — Telephone Encounter (Signed)
Patient called regarding blood test results. Please advise

## 2022-04-22 NOTE — Telephone Encounter (Signed)
See my response to his mychart msg. I attempted to call him directly but he did not answer his phone. I sent him a Pharmacist, community message.

## 2022-04-22 NOTE — Telephone Encounter (Signed)
Pt stated that he is very concerned about the recent AFP results .  Pt is very anxious and worried.  Please advise.

## 2022-04-22 NOTE — Telephone Encounter (Signed)
Patient calling back to follow up on call regarding blood test results. Is requesting to speak with a nurse. Please advise.

## 2022-04-28 ENCOUNTER — Ambulatory Visit (HOSPITAL_COMMUNITY)
Admission: RE | Admit: 2022-04-28 | Discharge: 2022-04-28 | Disposition: A | Discharge status: Home / Self Care | Payer: Medicaid Other | Source: Ambulatory Visit | Attending: Nurse Practitioner | Admitting: Nurse Practitioner

## 2022-04-28 DIAGNOSIS — K7030 Alcoholic cirrhosis of liver without ascites: Secondary | ICD-10-CM | POA: Diagnosis not present

## 2022-04-28 DIAGNOSIS — K5900 Constipation, unspecified: Secondary | ICD-10-CM | POA: Insufficient documentation

## 2022-04-28 DIAGNOSIS — Z86010 Personal history of colonic polyps: Secondary | ICD-10-CM | POA: Insufficient documentation

## 2022-05-01 ENCOUNTER — Encounter (HOSPITAL_COMMUNITY): Payer: Self-pay | Admitting: Gastroenterology

## 2022-05-05 ENCOUNTER — Ambulatory Visit (INDEPENDENT_AMBULATORY_CARE_PROVIDER_SITE_OTHER): Payer: Medicaid Other

## 2022-05-05 DIAGNOSIS — R002 Palpitations: Secondary | ICD-10-CM

## 2022-05-05 LAB — CUP PACEART REMOTE DEVICE CHECK
Date Time Interrogation Session: 20240407231422
Implantable Pulse Generator Implant Date: 20201228

## 2022-05-06 NOTE — Progress Notes (Signed)
Carelink Summary Report / Loop Recorder 

## 2022-05-08 ENCOUNTER — Telehealth: Payer: Self-pay | Admitting: Neurology

## 2022-05-08 ENCOUNTER — Encounter (HOSPITAL_COMMUNITY)
Admission: RE | Disposition: A | Discharge status: Home / Self Care | Payer: Self-pay | Source: Home / Self Care | Attending: Gastroenterology

## 2022-05-08 ENCOUNTER — Other Ambulatory Visit: Payer: Self-pay

## 2022-05-08 ENCOUNTER — Ambulatory Visit (HOSPITAL_COMMUNITY)
Admission: RE | Admit: 2022-05-08 | Discharge: 2022-05-08 | Disposition: A | Discharge status: Home / Self Care | Payer: Medicaid Other | Attending: Gastroenterology | Admitting: Gastroenterology

## 2022-05-08 ENCOUNTER — Encounter (HOSPITAL_COMMUNITY): Payer: Self-pay | Admitting: Gastroenterology

## 2022-05-08 ENCOUNTER — Ambulatory Visit (HOSPITAL_BASED_OUTPATIENT_CLINIC_OR_DEPARTMENT_OTHER): Payer: Medicaid Other | Admitting: Certified Registered Nurse Anesthetist

## 2022-05-08 ENCOUNTER — Ambulatory Visit (HOSPITAL_COMMUNITY): Payer: Medicaid Other | Admitting: Certified Registered Nurse Anesthetist

## 2022-05-08 DIAGNOSIS — K7460 Unspecified cirrhosis of liver: Secondary | ICD-10-CM

## 2022-05-08 DIAGNOSIS — Z87891 Personal history of nicotine dependence: Secondary | ICD-10-CM | POA: Insufficient documentation

## 2022-05-08 DIAGNOSIS — M1990 Unspecified osteoarthritis, unspecified site: Secondary | ICD-10-CM | POA: Insufficient documentation

## 2022-05-08 DIAGNOSIS — K7030 Alcoholic cirrhosis of liver without ascites: Secondary | ICD-10-CM | POA: Diagnosis not present

## 2022-05-08 DIAGNOSIS — G5603 Carpal tunnel syndrome, bilateral upper limbs: Secondary | ICD-10-CM

## 2022-05-08 DIAGNOSIS — I8510 Secondary esophageal varices without bleeding: Secondary | ICD-10-CM | POA: Insufficient documentation

## 2022-05-08 DIAGNOSIS — K3184 Gastroparesis: Secondary | ICD-10-CM | POA: Diagnosis not present

## 2022-05-08 DIAGNOSIS — Z87442 Personal history of urinary calculi: Secondary | ICD-10-CM | POA: Insufficient documentation

## 2022-05-08 DIAGNOSIS — Z8719 Personal history of other diseases of the digestive system: Secondary | ICD-10-CM | POA: Insufficient documentation

## 2022-05-08 DIAGNOSIS — K766 Portal hypertension: Secondary | ICD-10-CM | POA: Diagnosis not present

## 2022-05-08 DIAGNOSIS — Z86010 Personal history of colonic polyps: Secondary | ICD-10-CM | POA: Insufficient documentation

## 2022-05-08 DIAGNOSIS — K2289 Other specified disease of esophagus: Secondary | ICD-10-CM | POA: Diagnosis not present

## 2022-05-08 DIAGNOSIS — F32A Depression, unspecified: Secondary | ICD-10-CM | POA: Diagnosis not present

## 2022-05-08 DIAGNOSIS — I4891 Unspecified atrial fibrillation: Secondary | ICD-10-CM | POA: Insufficient documentation

## 2022-05-08 DIAGNOSIS — F419 Anxiety disorder, unspecified: Secondary | ICD-10-CM | POA: Insufficient documentation

## 2022-05-08 DIAGNOSIS — I8500 Esophageal varices without bleeding: Secondary | ICD-10-CM | POA: Diagnosis not present

## 2022-05-08 DIAGNOSIS — K3189 Other diseases of stomach and duodenum: Secondary | ICD-10-CM | POA: Diagnosis not present

## 2022-05-08 DIAGNOSIS — K219 Gastro-esophageal reflux disease without esophagitis: Secondary | ICD-10-CM | POA: Insufficient documentation

## 2022-05-08 DIAGNOSIS — K317 Polyp of stomach and duodenum: Secondary | ICD-10-CM | POA: Insufficient documentation

## 2022-05-08 DIAGNOSIS — R200 Anesthesia of skin: Secondary | ICD-10-CM

## 2022-05-08 DIAGNOSIS — E039 Hypothyroidism, unspecified: Secondary | ICD-10-CM | POA: Diagnosis not present

## 2022-05-08 DIAGNOSIS — I10 Essential (primary) hypertension: Secondary | ICD-10-CM | POA: Insufficient documentation

## 2022-05-08 DIAGNOSIS — I8501 Esophageal varices with bleeding: Secondary | ICD-10-CM

## 2022-05-08 DIAGNOSIS — K760 Fatty (change of) liver, not elsewhere classified: Secondary | ICD-10-CM | POA: Diagnosis not present

## 2022-05-08 DIAGNOSIS — G629 Polyneuropathy, unspecified: Secondary | ICD-10-CM

## 2022-05-08 HISTORY — PX: ESOPHAGOGASTRODUODENOSCOPY (EGD) WITH PROPOFOL: SHX5813

## 2022-05-08 HISTORY — PX: BIOPSY: SHX5522

## 2022-05-08 SURGERY — ESOPHAGOGASTRODUODENOSCOPY (EGD) WITH PROPOFOL
Anesthesia: Monitor Anesthesia Care

## 2022-05-08 MED ORDER — SODIUM CHLORIDE 0.9 % IV SOLN: INTRAVENOUS | Status: DC

## 2022-05-08 MED ORDER — MIDAZOLAM HCL 2 MG/2ML IJ SOLN
INTRAMUSCULAR | Status: AC
  Filled 2022-05-08: qty 2

## 2022-05-08 MED ORDER — PROPOFOL 500 MG/50ML IV EMUL
INTRAVENOUS | Status: AC
  Filled 2022-05-08: qty 50

## 2022-05-08 MED ORDER — PROPOFOL 10 MG/ML IV BOLUS
INTRAVENOUS | Status: DC | PRN
  Administered 2022-05-08: 50 mg via INTRAVENOUS
  Administered 2022-05-08: 30 mg via INTRAVENOUS

## 2022-05-08 MED ORDER — MIDAZOLAM HCL 2 MG/2ML IJ SOLN
INTRAMUSCULAR | Status: DC | PRN
  Administered 2022-05-08: 2 mg via INTRAVENOUS

## 2022-05-08 MED ORDER — LACTATED RINGERS IV SOLN: INTRAVENOUS | Status: DC

## 2022-05-08 MED ORDER — PROPOFOL 500 MG/50ML IV EMUL
INTRAVENOUS | Status: DC | PRN
  Administered 2022-05-08: 150 ug/kg/min via INTRAVENOUS

## 2022-05-08 SURGICAL SUPPLY — 15 items

## 2022-05-08 NOTE — Telephone Encounter (Signed)
Unable to LVM, no VM box. Sent mychart msg informing pt to call to schedule NCV/EMG

## 2022-05-08 NOTE — Op Note (Addendum)
[facility] Patient Name: Earl [name] Procedure Date: 05/08/2022 MRN: [id number] Attending MD: Lynann Bologna , MD, [id number] Date of Birth: March 21, 1960 CSN: [id number] Age: 62 Admit Type: Inpatient Procedure:                Upper GI endoscopy Indications:              For EV screening/evaluation. 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 Providers:                Lynann Bologna, MD, Carlena Hurl RN, RN,                            Rozetta Nunnery, Technician Referring MD:             Lynann Bologna, MD Medicines:                Monitored Anesthesia Care Complications:            No immediate complications. Estimated Blood Loss:     Estimated blood loss: none. Procedure:                Pre-Anesthesia Assessment:                           - Prior to the procedure, a History and Physical                            was performed, and patient medications and                            allergies were reviewed. [name] tolerance of                            previous anesthesia was also reviewed. The risks                            and benefits of the procedure and the sedation                            options and risks were discussed with the patient.                            All questions were answered, and informed consent                            was obtained. Prior Anticoagulants: The patient has                            taken no anticoagulant or antiplatelet agents. ASA  Grade Assessment: III - A patient with severe                            systemic disease. After reviewing the risks and                            benefits, the patient was deemed in satisfactory                            condition to undergo the procedure.                           After obtaining informed consent, the endoscope was                             passed under direct vision. Throughout the                            procedure, the patient's blood pressure, pulse, and                            oxygen saturations were monitored continuously. The                            [id number]-H190 ([id number]) Olympus endoscope was introduced                            through the mouth, and advanced to the second part                            of duodenum. The upper GI endoscopy was                            accomplished without difficulty. The patient                            tolerated the procedure well. Scope In: Scope Out: Findings:      A small scar was found in the lower third of the esophagus from previous       EVL.      Grade 0-I varices were found in the lower third of the esophagus.      Mild portal hypertensive gastropathy was found in the entire examined       stomach. Biopsies were taken with a cold forceps for histology. No       fundal varices. Incidental note was made of 2-3 small 4 to 6 mm gastric       polyps (left alone, previously deemed to be hyperplastic)      The examined duodenum was normal. Impression:               - Grade 0-I esophageal varices. Too small for EVL                           - Mild Portal hypertensive gastropathy. Biopsied. Moderate Sedation:      Not Applicable - Patient had  care per Anesthesia. Recommendation:           - Patient has a contact number available for                            emergencies. The signs and symptoms of potential                            delayed complications were discussed with the                            patient. Return to normal activities tomorrow.                            Written discharge instructions were provided to the                            patient.                           - Resume previous diet.                           - Continue present medications.                           - Await pathology results.                           - Repeat EGD  in 1 year.                           - Avoid nonsteroidals.                           - Follow-up in GI clinic 6 to 8 weeks.                           - The findings and recommendations were discussed                            with the patient's family. Procedure Code(s):        --- Professional ---                           [phone], Esophagogastroduodenoscopy, flexible,                            transoral; with biopsy, single or multiple Diagnosis Code(s):        --- Professional ---                           K22.89, Other specified disease of esophagus                           I85.00, Esophageal varices without bleeding  K76.6, Portal hypertension                           K31.89, Other diseases of stomach and duodenum                           R10.13, Epigastric pain CPT copyright 2022 American Medical Association. All rights reserved. The codes documented in this report are preliminary and upon coder review may  be revised to meet current compliance requirements. Lynann Bolognaajesh Patrisia Faeth, MD 05/08/2022 11:25:15 AM This report has been signed electronically. Number of Addenda: 0

## 2022-05-08 NOTE — Interval H&P Note (Signed)
History and Physical Interval Note:  05/08/2022 10:56 AM  Earl [name]  has presented today for surgery, with the diagnosis of esophageal  varices.  The various methods of treatment have been discussed with the patient and family. After consideration of risks, benefits and other options for treatment, the patient has consented to  Procedure(s): ESOPHAGOGASTRODUODENOSCOPY (EGD) WITH PROPOFOL (N/A) as a surgical intervention.  [name] history has been reviewed, patient examined, no change in status, stable for surgery.  I have reviewed the patient's chart and labs.  Questions were answered to the patient's satisfaction.     Lynann Bologna

## 2022-05-08 NOTE — H&P (Signed)
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 No history of hepatic encephalopathy. He was previously followed by Earl [name] and Earl [name] in [place], however, he was recently discharged from their practice. He requested transfer of GI management with Earl [name], accepted by Earl [name]. He presents to our office today as referred by Earl Rodney NP for further evaluation regarding cirrhosis and esophageal varices.  He denies having any nausea or vomiting. No abdominal pain or swelling.  No GERD symptoms or dysphagia. No issues with confusion. He has chronic constipation and is passing a normal formed brown stool every few days.  He stated taking MiraLAX, Linzess and various other laxatives in the past which were ineffective or resulted in excessive diarrhea.  No rectal bleeding or black stools.  He previously drank two 24 ounce beers daily. He remains abstinent from alcohol since 12/13/2021.  Father died from cirrhosis unrelated to alcohol use.   EGD 07/25/2020 showed grade 1 and grade 2 esophageal varices, only 1 column was large enough to be banded and was completely eradicated. His most recent surveillance EGD was 04/11/2021 which identified grade 1 esophageal varices, portal hypertensive gastropathy and a benign appearing gastric polyp. Repeat surveillance EGD in 1 year was recommended.  He is not on a nonselective beta-blocker for varices.  He is on Atenolol for hypertension.  No NSAID use.   His most recent abdominal imaging was a CTAP  without contrast 01/17/2020 which showed evidence of cirrhosis with portal hypertension and small upper abdominal varices with resolution of previously seen ascites. He stated Earl [name] ordered abdominal sonogram recently but the patient elected not to schedule it.    He underwent a screening colonoscopy in 2018 which identified 1 polyp removed from the sigmoid colon, path report identified invasive adenocarcinoma within a tubular adenomatous polyp. CTAP was unremarkable and CEA level was normal.  Per Earl [name] office notes, patient declined surgical resection. He underwent a surveillance colonoscopy 04/2017 and 2 small tubular adenomatous polyps were removed from the transverse and ascending colon.  His most recent colonoscopy was 07/25/2020 and 2 small tubular adenomatous polyps were removed from the splenic flexure.  At that time, a 5-year colonoscopy recall was recommended.       Latest Ref Rng & Units 11/25/2021    9:12 PM 06/27/2021   12:27 PM 03/06/2021   12:22 PM  CBC  WBC 4.0 - 10.5 K/uL 7.9  5.1  7.3   Hemoglobin 13.0 - 17.0 g/dL 92.9  57.4  73.4   Hematocrit 39.0 - 52.0 % 45.4  46.4  43.0   Platelets 150 - 400 K/uL 140  113  125         Latest Ref Rng & Units 11/25/2021    9:12 PM 06/27/2021   12:27 PM 03/06/2021   12:22 PM  CMP  Glucose 70 - 99 mg/dL 037  92  096   BUN 8 - 23 mg/dL 12  13  15    Creatinine 0.61 - 1.24 mg/dL 4.38  3.81  8.40   Sodium 135 - 145 mmol/L 135  140  140   Potassium 3.5 - 5.1 mmol/L 3.5  4.4  3.9   Chloride 98 - 111 mmol/L 106  105  102   CO2 22 - 32 mmol/L 20  24  22    Calcium 8.9 - 10.3 mg/dL 8.3  9.0  9.2   Total Protein 6.5 - 8.1 g/dL 7.7  7.5  6.8   Total Bilirubin 0.3 - 1.2 mg/dL 1.0  0.9  0.5   Alkaline Phos 38 - 126 U/L 94  117  115   AST 15 - 41 U/L 59  37  32   ALT 0 - 44 U/L 49  28  24     CTAP 01/17/2020: FINDINGS: Evaluation of this exam is limited in the absence of intravenous contrast.   Lower chest: The visualized lung bases  are clear.   No intra-abdominal free air or free fluid.   Hepatobiliary: Cirrhosis. No intrahepatic biliary dilatation. No calcified gallstone or pericholecystic fluid.   Pancreas: Unremarkable. No pancreatic ductal dilatation or surrounding inflammatory changes.   Spleen: Top-normal spleen size measuring 13 cm in length.   Adrenals/Urinary Tract: The adrenal glands unremarkable. There is a 7 mm nonobstructing right renal upper pole calculus and a punctate nonobstructing right renal inferior pole stone. A 2 mm nonobstructing stone is also noted in the inferior pole of the left kidney. There is no hydronephrosis on either side. The visualized ureters appear unremarkable. The urinary bladder is collapsed. There is apparent diffuse thickening of the bladder wall which may be partly related to underdistention. Cystitis is not excluded. Correlation with urinalysis recommended.   Stomach/Bowel: Small scattered sigmoid diverticula without active inflammatory changes. There is no bowel obstruction or active inflammation. The appendix is normal.   Vascular/Lymphatic: Mild aortoiliac atherosclerotic disease. The IVC is unremarkable. No portal gas. There is mild diffuse mesenteric edema. Interval resolution of the previously seen ascites. Small upper abdominal varices noted.   Reproductive: The prostate and seminal vesicles are grossly unremarkable.   Other: Small fat containing umbilical hernia.   Musculoskeletal: No acute or significant osseous findings.   IMPRESSION: 1. Nonobstructing bilateral renal calculi. No hydronephrosis. 2. Cirrhosis with evidence of portal hypertension and small upper abdominal varices. Interval resolution of the previously seen ascites. 3. No bowel obstruction. Normal appendix. 4. Aortic Atherosclerosis    PAST GI PROCEDURES:   EGD 04/11/2021 by Dr. Karilyn Cotaehman: - Normal hypopharynx.  - Normal proximal esophagus and mid esophagus.  - Scar in the distal  esophagus.  - Grade I esophageal varices.  - Z-line irregular, 40 cm from the incisors.  - Portal hypertensive gastropathy.  - A single gastric polyp. Appearance suggestive of hyperplastic polyp. It was left alone.  - Normal duodenal bulb and second portion of the duodenum. - No specimens collected.  -Repeat EGD in 1 year   EGD 07/25/2020: - Normal hypopharynx. - Normal proximal esophagus and mid esophagus. - Grade I and grade II esophageal varices. Only one column large enough to be banded. Completely eradicated. - Z-line irregular, 41 cm from the incisors. - Portal hypertensive gastropathy. - A few gastric polyps. - Normal duodenal bulb and second portion of the duodenum. - No specimens collected   Colonoscopy 07/25/2020: - Two 3 to 5 mm polyps at the splenic flexure, removed with a cold snare. Complete resection. Partial retrieval. - External and internal hemor - External and internal hemorrhoids - 5 Year colonoscopy recall A. COLON, SPLENIC FLEXURE, POLYPECTOMY:  - Tubular adenoma (s).  - No high-grade dysplasia or  carcinoma.    Colonoscopy 05/07/2017: - Two small polyps in the transverse colon and in the ascending colon. Biopsied.  - Post-polypectomy scar in the distal sigmoid colon.  - Internal hemorrhoids.  -3-year colonoscopy recall Colon, polyp(s), ascending and distal transverse - TUBULAR ADENOMA (X3 FRAGMENTS). - NO HIGH GRADE DYSPLASIA OR MALIGNANCY.   Colonoscopy 04/17/2016: -One 10 mm polyp in the distal sigmoid colon, removed with a hot snare.  Resected and retrieved. - Internal hemorrhoids. - ADENOCARCINOMA IN A TUBULAR ADENOMA WITH HIGH GRADE DYSPLASIA.       Past Medical History:  Diagnosis Date   Alcohol abuse     Anemia     Anxiety     Arthritis     Colon cancer (HCC)     Depression     Dysrhythmia     Gall stones     Heart burn     History of kidney stones     Hyperlipidemia     Hypertension     Hypothyroidism     Liver disease     Tachycardia      Typical atrial flutter [facility])           Past Surgical History:  Procedure Laterality Date   A-FLUTTER ABLATION N/A 07/31/2016    Procedure: A-Flutter Ablation;  Surgeon: Hillis Range, MD;  Location: [facility];  Service: Cardiovascular;  Laterality: N/A;   COLONOSCOPY N/A 04/17/2016    Procedure: COLONOSCOPY;  Surgeon: Malissa Hippo, MD;  Location: [facility];  Service: Endoscopy;  Laterality: N/A;  1200   COLONOSCOPY N/A 05/07/2017    Procedure: COLONOSCOPY;  Surgeon: Malissa Hippo, MD;  Location: [facility];  Service: Endoscopy;  Laterality: N/A;  200   COLONOSCOPY WITH PROPOFOL N/A 07/25/2020    Procedure: COLONOSCOPY WITH PROPOFOL;  Surgeon: Malissa Hippo, MD;  Location: [facility];  Service: Endoscopy;  Laterality: N/A;  12:10   ESOPHAGEAL BANDING   07/25/2020    Procedure: ESOPHAGEAL BANDING;  Surgeon: Malissa Hippo, MD;  Location: [facility];  Service: Endoscopy;;   ESOPHAGOGASTRODUODENOSCOPY (EGD) WITH PROPOFOL N/A 07/25/2020    Procedure: ESOPHAGOGASTRODUODENOSCOPY (EGD) WITH PROPOFOL;  Surgeon: Malissa Hippo, MD;  [facility]: [facility];  Service: Endoscopy;  Laterality: N/A;   ESOPHAGOGASTRODUODENOSCOPY (EGD) WITH PROPOFOL N/A 04/11/2021    Procedure: ESOPHAGOGASTRODUODENOSCOPY (EGD) WITH PROPOFOL;  Surgeon: Malissa Hippo, MD;  Location: [facility];  Service: Endoscopy;  Laterality: N/A;  1:00   implantable loop recorder placement   01/24/2019     Medtronic Reveal Linq model LNQ 11 implantable loop recorder (SN [id number] ) by Dr Johney Frame for evaluation of palpitations   MINOR CARPAL TUNNEL        right   POLYPECTOMY   04/17/2016    Procedure: POLYPECTOMY;  Surgeon: Malissa Hippo, MD;  [facility]: [facility];  Service: Endoscopy;;  sigmoid   POLYPECTOMY   07/25/2020    Procedure: POLYPECTOMY;  Surgeon: Malissa Hippo, MD;  [facility]: [facility];  Service: Endoscopy;;    Social History: He quit smoking cigarettes 43 years ago.  He remains  abstinent from alcohol since 12/08/2021.  No drug use.   Family History: Father had heart and thyroid disease, died from cirrhosis without history of alcohol use disorder.  Maternal great had colon cancer diagnosed in her 75s.          Allergies  Allergen Reactions   Amitriptyline Anaphylaxis   Amoxicillin Anaphylaxis and Rash  Has patient had a PCN reaction causing immediate rash, facial/tongue/throat swelling, SOB or lightheadedness with hypotension: Yes Has patient had a PCN reaction causing severe rash involving mucus membranes or skin necrosis: No Has patient had a PCN reaction that required hospitalization: No Has patient had a PCN reaction occurring within the last 10 years: Yes If all of the above answers are "NO", then may proceed with Cephalosporin use.     Asa [Aspirin] Anaphylaxis   Penicillin G Anaphylaxis   Clonidine Derivatives Other (See Comments)      Severe Dry Mouth   Hydrochlorothiazide        Caused rectal bleeding and hematuria   Levothyroxine Nausea And Vomiting      Patient states that the 75 mcg is too strong for him.   Trintellix [Vortioxetine] Nausea And Vomiting            Outpatient Encounter Medications as of 04/11/2022  Medication Sig   ALPRAZolam (XANAX) 1 MG tablet Take 1 tablet (1 mg total) by mouth daily as needed for anxiety. Must last 30 or more day before refill   atenolol (TENORMIN) 50 MG tablet TAKE 1 TABLET BY MOUTH DAILY   diltiazem (CARDIZEM CD) 300 MG 24 hr capsule TAKE ONE CAPSULE BY MOUTH DAILY   furosemide (LASIX) 40 MG tablet TAKE 1 TABLET BY MOUTH DAILY   LYRICA 200 MG capsule TAKE THREE CAPSULES BY MOUTH DAILY   ondansetron (ZOFRAN) 4 MG tablet TAKE 1 TABLET BY MOUTH TWICE DAILY AS NEEDED FOR NAUSEA OR FOR VOMITING   Oxcarbazepine (TRILEPTAL) 300 MG tablet Take 1 tablet (300 mg total) by mouth 2 (two) times daily.   pantoprazole (PROTONIX) 40 MG tablet TAKE 1 TABLET BY MOUTH DAILY BEFORE breakfast    No facility-administered  encounter medications on file as of 04/11/2022.    REVIEW OF SYSTEMS:  Gen: Denies fever, sweats or chills. No weight loss.  CV: Denies chest pain, palpitations or edema. Resp: Denies cough, shortness of breath of hemoptysis.  GI: See HPI. GU : Denies urinary burning, blood in urine, increased urinary frequency or incontinence. MS: Denies joint pain, muscles aches or weakness. Derm: Denies rash, itchiness, skin lesions or unhealing ulcers. Psych: Denies depression, anxiety, memory loss or confusion. Heme: Denies bruising, easy bleeding. Neuro:  Denies headaches, dizziness or paresthesias. Endo:  Denies any problems with DM, thyroid or adrenal function.   PHYSICAL EXAM: BP 126/74 (BP Location: Left Arm, Patient Position: Sitting, Cuff Size: Normal)   Pulse 72   Ht 5' 7.25" (1.708 m) Comment: height measured without shoes  Wt 268 lb 4 oz (121.7 kg)   BMI 41.70 kg/m    General: 62 year old male in no acute distress. Head: Normocephalic and atraumatic. Eyes:  Sclerae non-icteric, conjunctive pink. Ears: Normal auditory acuity. Mouth: Dentition intact. No ulcers or lesions.  Neck: Supple, no lymphadenopathy or thyromegaly.  Lungs: Clear bilaterally to auscultation without wheezes, crackles or rhonchi. Heart: Regular rate and rhythm. No murmur, rub or gallop appreciated.  Abdomen: Soft, nontender, nondistended. No masses. No hepatosplenomegaly. Normoactive bowel sounds x 4 quadrants.  Rectal: Deferred.  Musculoskeletal: Symmetrical with no gross deformities. Skin: Warm and dry. No rash or lesions on visible extremities. Extremities: No edema. Neurological: Alert oriented x 4, no focal deficits.  No asterixis. Psychological:  Alert and cooperative. Normal mood and affect.   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  No history of or current overt signs of hepatic encephalopathy. -Surveillance EGD benefits and risks discussed including risk with sedation, risk of bleeding, perforation and infection. Addendum: EGD with Earl [name] scheduled at [facility] 05/08/2022. -If he has recurrence of esophageal varices, I recommend contacting his cardiologist or provider who prescribes Atenolol to verify if we can switch it to a nonselective beta-blocker versus Carvedilol -RUQ sonogram  -CBC, CMP, PT/INR, AFP.  Calculate MELD score after lab results reviewed. -No NSAIDs -2 g low-sodium diet -Continued alcohol abstinence encouraged   History of a 10mm invasive adenocarcinoma within a tubular adenomatous polyp with clear margins to the sigmoid colon per colonoscopy 03/2016. His most recent surveillance colonoscopy was 07/25/2020 and two 3-5 tubular adenomatous polyps were removed at the splenic flexure.  Recall colonoscopy in 5 years. -Earl [name] to further verify colonoscopy recall date   Chronic constipation -Lactulose 20 g p.o. nightly as needed   History of atrial flutter, not on anticoagulation.  On Atenolol and Diltiazem.   Attending physician's note   I have taken history, reviewed the chart and examined the patient. I performed a substantive portion of this encounter, including complete performance of at least one of the key components, in conjunction with the APP. I agree with the Advanced Practitioner's note, impression and recommendations.   For EGD today Doesn't want colon yet   Edman Circle, MD [facility] [phone]

## 2022-05-08 NOTE — Telephone Encounter (Signed)
Jillian/Angel, please call pt and let him know Dr. Epimenio Foot got his message and would like him to have an updated EMG/NCS. We placed order. Please get pt scheduled, thank you!

## 2022-05-08 NOTE — Telephone Encounter (Signed)
Pt called. Stated she wanted to let D.  Sater know that his twitch has stopped. Pt stated he needs to talk with Dr. Epimenio Foot about getting Carpal Tunnel fixed.

## 2022-05-08 NOTE — Transfer of Care (Signed)
Immediate Anesthesia Transfer of Care Note  Patient: Earl [name]  Procedure(s) Performed: ESOPHAGOGASTRODUODENOSCOPY (EGD) WITH PROPOFOL BIOPSY  Patient Location: Endoscopy Unit  Anesthesia Type:MAC  Level of Consciousness: drowsy  Airway & Oxygen Therapy: Patient Spontanous Breathing and Patient connected to face mask  Post-op Assessment: Report given to RN and Post -op Vital signs reviewed and stable  Post vital signs: Reviewed and stable  Last Vitals:  Vitals Value Taken Time  BP    Temp    Pulse 59 05/08/22 1117  Resp 12 05/08/22 1117  SpO2 96 % 05/08/22 1117  Vitals shown include unvalidated device data.  Last Pain:  Vitals:   05/08/22 1027  TempSrc: Temporal  PainSc: 0-No pain         Complications: No notable events documented.

## 2022-05-08 NOTE — Discharge Instructions (Signed)
YOU HAD AN ENDOSCOPIC PROCEDURE TODAY: Refer to the procedure report and other information in the discharge instructions given to you for any specific questions about what was found during the examination. If this information does not answer your questions, please call [place] office at [phone] to clarify.  ° °YOU SHOULD EXPECT: Some feelings of bloating in the abdomen. Passage of more gas than usual. Walking can help get rid of the air that was put into your GI tract during the procedure and reduce the bloating. If you had a lower endoscopy (such as a colonoscopy or flexible sigmoidoscopy) you may notice spotting of blood in your stool or on the toilet paper. Some abdominal soreness may be present for a day or two, also. ° °DIET: Your first meal following the procedure should be a light meal and then it is ok to progress to your normal diet. A half-sandwich or bowl of soup is an example of a good first meal. Heavy or fried foods are harder to digest and may make you feel nauseous or bloated. Drink plenty of fluids but you should avoid alcoholic beverages for 24 hours. If you had a esophageal dilation, please see attached instructions for diet.   ° °ACTIVITY: Your care partner should take you home directly after the procedure. You should plan to take it easy, moving slowly for the rest of the day. You can resume normal activity the day after the procedure however YOU SHOULD NOT DRIVE, use power tools, machinery or perform tasks that involve climbing or major physical exertion for 24 hours (because of the sedation medicines used during the test).  ° °SYMPTOMS TO REPORT IMMEDIATELY: °A gastroenterologist can be reached at any hour. Please call [phone]  for any of the following symptoms:  °Following lower endoscopy (colonoscopy, flexible sigmoidoscopy) °Excessive amounts of blood in the stool  °Significant tenderness, worsening of abdominal pains  °Swelling of the abdomen that is new, acute  °Fever of 100° or  higher  °Following upper endoscopy (EGD, EUS, ERCP, esophageal dilation) °Vomiting of blood or coffee ground material  °New, significant abdominal pain  °New, significant chest pain or pain under the shoulder blades  °Painful or persistently difficult swallowing  °New shortness of breath  °Black, tarry-looking or red, bloody stools ° °FOLLOW UP:  °If any biopsies were taken you will be contacted by phone or by letter within the next 1-3 weeks. Call [phone]  if you have not heard about the biopsies in 3 weeks.  °Please also call with any specific questions about appointments or follow up tests. ° °

## 2022-05-08 NOTE — Anesthesia Preprocedure Evaluation (Addendum)
Anesthesia Evaluation  Patient identified by MRN, date of birth, ID band Patient awake    Reviewed: Allergy & Precautions, NPO status , Patient's Chart, lab work & pertinent test results, reviewed documented beta blocker date and time   Airway Mallampati: II  TM Distance: >3 FB Neck ROM: Full    Dental  (+) Teeth Intact, Dental Advisory Given, Caps,    Pulmonary former smoker   Pulmonary exam normal breath sounds clear to auscultation       Cardiovascular hypertension, Pt. on home beta blockers and Pt. on medications (-) angina Normal cardiovascular exam+ dysrhythmias Atrial Fibrillation  Rhythm:Regular Rate:Normal     Neuro/Psych  PSYCHIATRIC DISORDERS Anxiety Depression    negative neurological ROS     GI/Hepatic ,GERD  ,,(+) Cirrhosis   Esophageal Varices  substance abuse  alcohol useColon cancer    Endo/Other  Hypothyroidism    Renal/GU negative Renal ROS     Musculoskeletal  (+) Arthritis ,    Abdominal   Peds  Hematology negative hematology ROS (+)   Anesthesia Other Findings Day of surgery medications reviewed with the patient.  Reproductive/Obstetrics                             Anesthesia Physical Anesthesia Plan  ASA: 3  Anesthesia Plan: MAC   Post-op Pain Management: Minimal or no pain anticipated   Induction: Intravenous  PONV Risk Score and Plan: 1 and TIVA and Treatment may vary due to age or medical condition  Airway Management Planned: Natural Airway and Simple Face Mask  Additional Equipment:   Intra-op Plan:   Post-operative Plan:   Informed Consent: I have reviewed the patients History and Physical, chart, labs and discussed the procedure including the risks, benefits and alternatives for the proposed anesthesia with the patient or authorized representative who has indicated his/her understanding and acceptance.     Dental advisory given  Plan  Discussed with: CRNA and Anesthesiologist  Anesthesia Plan Comments:        Anesthesia Quick Evaluation

## 2022-05-08 NOTE — Telephone Encounter (Signed)
Dr. Epimenio Foot- I tried reviewing last OV note but EPIC gives me this response: "There are sensitive note(s) to which you are not granted access."    Do you want to see him for a visit first? He has an upcoming visit 06/10/22

## 2022-05-08 NOTE — Anesthesia Procedure Notes (Signed)
Procedure Name: MAC Date/Time: 05/08/2022 11:02 AM  Performed by: Vanessa Maiden, CRNAPre-anesthesia Checklist: Patient identified, Emergency Drugs available, Suction available and Patient being monitored Patient Re-evaluated:Patient Re-evaluated prior to induction Oxygen Delivery Method: Simple face mask

## 2022-05-09 LAB — SURGICAL PATHOLOGY

## 2022-05-09 NOTE — Anesthesia Postprocedure Evaluation (Signed)
Anesthesia Post Note  Patient: Earl [name]  Procedure(s) Performed: ESOPHAGOGASTRODUODENOSCOPY (EGD) WITH PROPOFOL BIOPSY     Patient location during evaluation: Endoscopy Anesthesia Type: MAC Level of consciousness: oriented, awake and alert and awake Pain management: pain level controlled Vital Signs Assessment: post-procedure vital signs reviewed and stable Respiratory status: spontaneous breathing, nonlabored ventilation, respiratory function stable and patient connected to nasal cannula oxygen Cardiovascular status: blood pressure returned to baseline and stable Postop Assessment: no headache, no backache and no apparent nausea or vomiting Anesthetic complications: no   No notable events documented.  Last Vitals:  Vitals:   05/08/22 1120 05/08/22 1130  BP: (!) 96/54 (!) 145/76  Pulse: (!) 58 63  Resp: 12 16  Temp:    SpO2: 97% 98%    Last Pain:  Vitals:   05/08/22 1130  TempSrc:   PainSc: 0-No pain                 Collene Schlichter

## 2022-05-11 ENCOUNTER — Encounter: Payer: Self-pay | Admitting: Gastroenterology

## 2022-05-11 ENCOUNTER — Encounter (HOSPITAL_COMMUNITY): Payer: Self-pay | Admitting: Gastroenterology

## 2022-05-20 ENCOUNTER — Other Ambulatory Visit: Payer: Self-pay

## 2022-05-20 DIAGNOSIS — K7030 Alcoholic cirrhosis of liver without ascites: Secondary | ICD-10-CM

## 2022-05-20 NOTE — Telephone Encounter (Signed)
Pt called back. Requesting a call back to schedule NCV/EMG.

## 2022-05-20 NOTE — Telephone Encounter (Signed)
Pt scheduled for nerve conduction study with Dr. Marjory Lies for 07/03/22 at 1:30pm

## 2022-05-21 NOTE — Telephone Encounter (Signed)
Disregard msg below as patient was dismissed from Dr. Patty Sermons GI office and not by [place]. Patient to proceed with abd MRI  as planned.

## 2022-05-22 ENCOUNTER — Telehealth: Payer: Self-pay | Admitting: Neurology

## 2022-05-22 NOTE — Telephone Encounter (Signed)
Sent mychart msg informing pt of appointment change 

## 2022-05-26 ENCOUNTER — Other Ambulatory Visit: Payer: Self-pay | Admitting: Neurology

## 2022-05-26 DIAGNOSIS — F1020 Alcohol dependence, uncomplicated: Secondary | ICD-10-CM

## 2022-05-26 DIAGNOSIS — G629 Polyneuropathy, unspecified: Secondary | ICD-10-CM

## 2022-05-26 DIAGNOSIS — R259 Unspecified abnormal involuntary movements: Secondary | ICD-10-CM

## 2022-05-26 DIAGNOSIS — E538 Deficiency of other specified B group vitamins: Secondary | ICD-10-CM

## 2022-05-26 NOTE — Telephone Encounter (Signed)
You are the work in am provider, Dr.Sater is out of the office  Pt last seen on 12/10/21  Follow up visit scheduled on 06/10/22 Last filled on 05/06/22 # 90 tablets (30 day supply) Rx pending to be signed

## 2022-05-27 ENCOUNTER — Encounter: Payer: Medicaid Other | Admitting: Gastroenterology

## 2022-05-29 NOTE — Telephone Encounter (Signed)
Most important thing is -To stay away from alcohol -Avoid CBD -Lose weight with Low-fat Nl protein diet and exercise -Regular follow-ups  RG

## 2022-05-30 ENCOUNTER — Encounter: Payer: Self-pay | Admitting: Cardiovascular Disease

## 2022-05-30 ENCOUNTER — Ambulatory Visit: Payer: Medicaid Other | Attending: Cardiovascular Disease | Admitting: Cardiovascular Disease

## 2022-05-30 VITALS — BP 104/58 | HR 56 | Ht 70.0 in | Wt 251.6 lb

## 2022-05-30 DIAGNOSIS — I471 Supraventricular tachycardia, unspecified: Secondary | ICD-10-CM | POA: Diagnosis present

## 2022-05-30 DIAGNOSIS — I480 Paroxysmal atrial fibrillation: Secondary | ICD-10-CM | POA: Insufficient documentation

## 2022-05-30 NOTE — Progress Notes (Signed)
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 male who presents today for routine electrophysiology followup.  Since last being seen in our clinic, the patient reports doing very well.  Today, he denies symptoms of palpitations, chest pain, shortness of breath,  lower extremity edema, dizziness, presyncope, or syncope.  Otherwise, he does complain of facial twitching that he has had for years but has been worse recently, and he notes neuropathy which causes balance issues and frequent falls.   Past Medical History:  Diagnosis Date   Alcohol abuse    Anemia    Anxiety    Arthritis    Colon cancer (HCC)    Depression    Dysrhythmia    Gall stones    Heart burn    History of kidney stones    Hyperlipidemia    Hypertension    Hypothyroidism    Liver disease    Pernicious anemia    Tachycardia    Typical atrial flutter (HCC)    Vitamin B12 deficiency    Past Surgical History:  Procedure Laterality Date   A-FLUTTER ABLATION N/A 07/31/2016   Procedure: A-Flutter Ablation;  Surgeon: Hillis Range, MD;  Location: [facility];  Service: Cardiovascular;  Laterality: N/A;   BIOPSY  05/08/2022   Procedure: BIOPSY;  Surgeon: Lynann Bologna, MD;  Location: [facility];  Service: Gastroenterology;;   COLONOSCOPY N/A 04/17/2016   Procedure: COLONOSCOPY;  Surgeon: Malissa Hippo, MD;  Location: [facility];  Service: Endoscopy;  Laterality: N/A;  1200   COLONOSCOPY N/A 05/07/2017   Procedure: COLONOSCOPY;  Surgeon: Malissa Hippo, MD;  Location: [facility];  Service: Endoscopy;  Laterality: N/A;  200   COLONOSCOPY WITH PROPOFOL N/A 07/25/2020   Procedure: COLONOSCOPY WITH PROPOFOL;  Surgeon: Malissa Hippo, MD;  Location: [facility];  Service: Endoscopy;  Laterality: N/A;  12:10   ESOPHAGEAL BANDING  07/25/2020   Procedure: ESOPHAGEAL BANDING;  Surgeon: Malissa Hippo, MD;  Location: [facility];  Service: Endoscopy;;    ESOPHAGOGASTRODUODENOSCOPY (EGD) WITH PROPOFOL N/A 07/25/2020   Procedure: ESOPHAGOGASTRODUODENOSCOPY (EGD) WITH PROPOFOL;  Surgeon: Malissa Hippo, MD;  Location: [facility];  Service: Endoscopy;  Laterality: N/A;   ESOPHAGOGASTRODUODENOSCOPY (EGD) WITH PROPOFOL N/A 04/11/2021   Procedure: ESOPHAGOGASTRODUODENOSCOPY (EGD) WITH PROPOFOL;  Surgeon: Malissa Hippo, MD;  Location: [facility];  Service: Endoscopy;  Laterality: N/A;  1:00   ESOPHAGOGASTRODUODENOSCOPY (EGD) WITH PROPOFOL N/A 05/08/2022   Procedure: ESOPHAGOGASTRODUODENOSCOPY (EGD) WITH PROPOFOL;  Surgeon: Lynann Bologna, MD;  Location: [facility];  Service: Gastroenterology;  Laterality: N/A;   implantable loop recorder placement  01/24/2019    [employer]tronic Reveal Linq model LNQ 11 implantable loop recorder (SN [id number] ) by Dr Johney Frame for evaluation of palpitations   MINOR CARPAL TUNNEL     right   POLYPECTOMY  04/17/2016   Procedure: POLYPECTOMY;  Surgeon: Malissa Hippo, MD;  Location: [facility];  Service: Endoscopy;;  sigmoid   POLYPECTOMY  07/25/2020   Procedure: POLYPECTOMY;  Surgeon: Malissa Hippo, MD;  Location: [facility];  Service: Endoscopy;;    ROS- all systems are reviewed and negatives except as per HPI above  Current Outpatient Medications  Medication Sig Dispense Refill   ALPRAZolam (XANAX) 1 MG tablet Take 1 tablet (1 mg total) by mouth daily as needed for anxiety. Must last 30 or more day before refill 30 tablet 3  atenolol (TENORMIN) 50 MG tablet TAKE 1 TABLET BY MOUTH DAILY (Patient taking differently: Take 25 mg by mouth daily.) 90 tablet 1   diltiazem (CARDIZEM CD) 300 MG 24 hr capsule TAKE ONE CAPSULE BY MOUTH DAILY 30 capsule 5   furosemide (LASIX) 40 MG tablet TAKE 1 TABLET BY MOUTH DAILY (Patient taking differently: Take 40 mg by mouth daily as needed for fluid or edema.) 30 tablet 2   Lactulose 20 GM/30ML SOLN Take 300 mLs (200 g total) by mouth at bedtime as needed (constipation). 300 mL  0   LYRICA 200 MG capsule TAKE THREE CAPSULES BY MOUTH DAILY 90 capsule 2   melatonin 3 MG TABS tablet Take 3 mg by mouth at bedtime as needed (sleep).     ondansetron (ZOFRAN) 4 MG tablet TAKE 1 TABLET BY MOUTH TWICE DAILY AS NEEDED FOR NAUSEA OR FOR VOMITING 30 tablet 1   pantoprazole (PROTONIX) 40 MG tablet TAKE 1 TABLET BY MOUTH DAILY BEFORE breakfast (Patient taking differently: Take 40 mg by mouth daily as needed (Heartburn).) 90 tablet 1   Oxcarbazepine (TRILEPTAL) 300 MG tablet Take 1 tablet (300 mg total) by mouth 2 (two) times daily. (Patient taking differently: Take 300 mg by mouth daily.) 60 tablet 5   No current facility-administered medications for this visit.    Physical Exam: Vitals:   05/30/22 1132  Weight: 251 lb 9.6 oz (114.1 kg)  Height: 5\' 10"  (1.778 m)    GEN- The patient is well appearing, alert and oriented x 3 today.   Head- normocephalic, atraumatic Eyes-  Sclera clear, conjunctiva pink Ears- hearing intact Oropharynx- clear Lungs- Clear to ausculation bilaterally, normal work of breathing Heart- Regular rate and rhythm, no murmurs, rubs or gallops, PMI not laterally displaced GI- soft, NT, ND, + BS Extremities- no clubbing, cyanosis, or edema Neuro - left facial twitch  Wt Readings from Last 3 Encounters:  05/30/22 251 lb 9.6 oz (114.1 kg)  05/08/22 268 lb 4.8 oz (121.7 kg)  04/11/22 268 lb 4 oz (121.7 kg)      Assessment and Plan:  SVT Well controlled by ILR review today S/p prior atach and CTI ablation  2. Atrial fibrillation Diagnosed by ILR -- no episodes recently V-rates controlled CHADS2VASC is 1. Pt has increased risk of bleeding with frequent falls as well as esophageal varices. Will continue to monitor. May consider ablation in future if burden progresses.  3. HTN Stable No change required today  4. ETOH Avoidance advised  5. Overweight Body mass index is 36.1 kg/m. Lifestyle modification advised  Risks, benefits and  potential toxicities for medications prescribed and/or refilled reviewed with patient today.   Return in a year  Maurice Small, MD  05/30/2022 11:37 AM

## 2022-05-30 NOTE — Patient Instructions (Signed)
Medication Instructions:  Continue all current medications.  Labwork: none  Testing/Procedures: none  Follow-Up: 1 year - Dr.  Mealor   Any Other Special Instructions Will Be Listed Below (If Applicable).   If you need a refill on your cardiac medications before your next appointment, please call your pharmacy.; 

## 2022-06-09 ENCOUNTER — Ambulatory Visit (INDEPENDENT_AMBULATORY_CARE_PROVIDER_SITE_OTHER): Payer: Medicaid Other

## 2022-06-09 DIAGNOSIS — R002 Palpitations: Secondary | ICD-10-CM | POA: Diagnosis not present

## 2022-06-09 LAB — CUP PACEART REMOTE DEVICE CHECK
Date Time Interrogation Session: 20240510230305
Implantable Pulse Generator Implant Date: 20201228

## 2022-06-10 ENCOUNTER — Encounter: Payer: Self-pay | Admitting: Neurology

## 2022-06-10 ENCOUNTER — Ambulatory Visit (HOSPITAL_COMMUNITY): Payer: Medicaid Other

## 2022-06-10 ENCOUNTER — Ambulatory Visit: Payer: Medicaid Other | Admitting: Neurology

## 2022-06-10 VITALS — BP 126/78 | HR 50 | Ht 70.0 in | Wt 254.0 lb

## 2022-06-10 DIAGNOSIS — G5603 Carpal tunnel syndrome, bilateral upper limbs: Secondary | ICD-10-CM

## 2022-06-10 DIAGNOSIS — E559 Vitamin D deficiency, unspecified: Secondary | ICD-10-CM | POA: Diagnosis not present

## 2022-06-10 DIAGNOSIS — E538 Deficiency of other specified B group vitamins: Secondary | ICD-10-CM | POA: Diagnosis not present

## 2022-06-10 DIAGNOSIS — G5132 Clonic hemifacial spasm, left: Secondary | ICD-10-CM

## 2022-06-10 DIAGNOSIS — G5602 Carpal tunnel syndrome, left upper limb: Secondary | ICD-10-CM | POA: Insufficient documentation

## 2022-06-10 MED ORDER — LEVETIRACETAM 750 MG PO TABS
750.0000 mg | ORAL_TABLET | Freq: Two times a day (BID) | ORAL | 5 refills | Status: DC
Start: 2022-06-10 — End: 2022-12-30

## 2022-06-10 MED ORDER — CYANOCOBALAMIN 1000 MCG/ML IJ SOLN: 1000.0000 ug | INTRAMUSCULAR | 11 refills | Status: DC

## 2022-06-10 NOTE — Progress Notes (Signed)
[facility]  PATIENT: Earl [name] DOB: 1960-10-29  REFERRING DOCTOR OR PCP:  Dr. Oswaldo Done SOURCE: Patient, notes from primary care, imaging reports, MRI images personally reviewed on PACS  _________________________________   HISTORICAL  CHIEF COMPLAINT:  Chief Complaint  Patient presents with   Follow-up    Pt in room 10. 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 Pt said left side face spasm stopped for 2 weeks, has returned now pain shoots down his neck.. Pt said left hand has painful carpal tunnel,hurts all the time.  Pt is unable to play is guitar and unable to do this due now. Pt said both thumb have burning sensation. Pt asked if B12 level can be checked. Pt stopped Atenolol pill recently.     HISTORY OF PRESENT ILLNESS:  Earl [name] is a 62 y.o. man with numbness in his legs and left facial twitching.      Update 06/10/2022 He is noting more numbness and weakness in the left hand  He is a 62 year old man who had pernicious anemia /  severe B12 deficiency around 2012.    He was started on B12 shots and the numbness improved but did not resolve.   Since that time his balance has been poor.  He was diagnosed with a small fiber polyneuropathy after a biopsy by Dr. Gerilyn Pilgrim.     He continues to note numbness and pain in his legs and takes pregabalin 200 mg po tid. the pregabalin has helped to reduce the pain.  He also notes twitches/spasms in the left side of his face that started in early 2017.   This has worsened over the last few years and occurs frequently throughout the day.   He never had weakness in the face (Bell's palsy).   He also notes some numbness and a funny sensation in the left face.   He sometimes drools on that side.   Imitially, the symptoms seemed to fluctuate but now are more constant. His eye closes when he smiles.     He also has carpal tunnel syndrome on the left diagnosed a couple years ago (we don't have the study).   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   Sometimes leg jerking wakes him up.   He has some anxiety and occasioanlly takes alprazolam.     He has a history of alcohol abuse and has alcoholic cirrhosis followed by GI.   He has atrial flutter, treated with ablation with benefit.    He has ben found to have mild hypothyroid and used to be on synthroid.   He has a history of alcohol abuse (2 bottles of wine a day) but stopped a couple years ago and has not resumed.  IMAGING: MRI Brain 06/23/2012.  Shows some scattered T2/FLAIR hyperintense foci predominantly in the deep white matter.  There was one focus in the cerebellum also.  This is a nonspecific finding and most likely represents mild chronic microvascular ischemic change.  The lesions do not have a demyelinating appearance.  There were no acute findings.  MRI Brain 05/27/2017 showed a few scattered small T2/FLAIR hyperintense foci in the cerebral hemispheres consistent with minimal age-related chronic microvascular ischemic change.  No definite change compared to the 06/23/2012 MRI.  Normal enhancement pattern.  The left vertebral artery is  dominant and tortuous.  MRI cervical spine 05/27/2017 shows a normal spinal cord.  There are mild degenerative changes at C3-C4 through C6-C7 but no spinal stenosis or nerve root compression.  MRi brain/iAC 02/24/2022 showed: FINDINGS: Brain: Negative for retrocochlear mass or inflammatory type enhancement. The left vertebral artery is dominant and torturous, traversing the left CP angle cistern. The left AICA passes in close proximity to the cisternal left vestibulocochlear nerve complex with branch that extends medial and then inferior with contact of the left facial nerve root entry zone. Normal appearance of the brainstem.   Few remote white matter insults with nonspecific pattern.  No acute or subacute infarct, hemorrhage, hydrocephalus, or collection.   Vascular: Normal flow voids.   Skull and upper cervical spine: Normal marrow signal.   Sinuses/Orbits: Negative.   IMPRESSION: Negative for mass or neuritic enhancement. Arterial anatomy at the left facial nerve root entry zone described above.  REVIEW OF SYSTEMS: Constitutional: No fevers, chills, sweats, or change in appetite.     Eyes: No visual changes, double vision, eye pain Ear, nose and throat: No hearing loss, ear pain, nasal congestion, sore throat Cardiovascular: No chest pain, palpitations Respiratory:  No shortness of breath at rest or with exertion.   No wheezes GastrointestinaI: No nausea, vomiting, diarrhea, abdominal pain, fecal incontinence Genitourinary:  No dysuria, urinary retention or frequency.  No nocturia. Musculoskeletal:  No neck pain, back pain Integumentary: No rash, pruritus, skin lesions Neurological: as above Psychiatric: No depression at this time.  No anxiety Endocrine: No palpitations, diaphoresis, change in appetite, change in weigh or increased thirst Hematologic/Lymphatic:  No anemia, purpura, petechiae. Allergic/Immunologic: No itchy/runny eyes, nasal congestion, recent allergic reactions, rashes  ALLERGIES: Allergies  Allergen Reactions   Amitriptyline Anaphylaxis   Amoxicillin Anaphylaxis and Rash    Has patient had a PCN reaction causing immediate rash, facial/tongue/throat swelling, SOB or lightheadedness with hypotension: Yes Has patient had a PCN reaction causing severe rash involving mucus membranes or skin necrosis: No Has patient had a PCN reaction that required hospitalization: No Has patient had a PCN reaction occurring within the last 10 years: Yes If all of the above answers are "NO", then may proceed with Cephalosporin use.    Asa [Aspirin] Anaphylaxis   Penicillin G Anaphylaxis   Clonidine Derivatives Other (See Comments)    Severe Dry Mouth    Hydrochlorothiazide     Caused rectal bleeding and hematuria   Levothyroxine Nausea And Vomiting    Patient states that the 75 mcg is too strong for him.   Trintellix [Vortioxetine] Nausea And Vomiting    HOME MEDICATIONS:  Current Outpatient Medications:    ALPRAZolam (XANAX) 1 MG tablet, Take 1 tablet (1 mg total) by mouth daily as needed for anxiety. Must last 30 or more day before refill, Disp: 30 tablet, Rfl: 3   atenolol (TENORMIN) 50 MG tablet, TAKE 1 TABLET BY MOUTH DAILY (Patient taking differently: Take 25 mg by mouth daily.), Disp: 90 tablet, Rfl: 1   diltiazem (CARDIZEM CD) 300 MG 24 hr capsule, TAKE ONE CAPSULE BY MOUTH DAILY, Disp: 30 capsule, Rfl: 5   furosemide (LASIX) 40 MG tablet, TAKE 1 TABLET BY MOUTH DAILY (Patient taking differently: Take 40 mg by mouth daily as needed for fluid or edema.), Disp: 30 tablet, Rfl: 2   Lactulose 20 GM/30ML SOLN, Take 300 mLs (200 g total) by mouth at bedtime as needed (constipation)., Disp: 300 mL, Rfl: 0   levETIRAcetam (KEPPRA) 750 MG tablet, Take 1  tablet (750 mg total) by mouth 2 (two) times daily., Disp: 60 tablet, Rfl: 5   LYRICA 200 MG capsule, TAKE THREE CAPSULES BY MOUTH DAILY, Disp: 90 capsule, Rfl: 2   melatonin 3 MG TABS tablet, Take 3 mg by mouth at bedtime as needed (sleep)., Disp: , Rfl:    ondansetron (ZOFRAN) 4 MG tablet, TAKE 1 TABLET BY MOUTH TWICE DAILY AS NEEDED FOR NAUSEA OR FOR VOMITING, Disp: 30 tablet, Rfl: 1   Oxcarbazepine (TRILEPTAL) 300 MG tablet, Take 1 tablet (300 mg total) by mouth 2 (two) times daily. (Patient taking differently: Take 300 mg by mouth daily.), Disp: 60 tablet, Rfl: 5   pantoprazole (PROTONIX) 40 MG tablet, TAKE 1 TABLET BY MOUTH DAILY BEFORE breakfast (Patient taking differently: Take 40 mg by mouth daily as needed (Heartburn).), Disp: 90 tablet, Rfl: 1   cyanocobalamin (VITAMIN B12) 1000 MCG/ML injection, Inject 1 mL (1,000 mcg total) into the muscle every 30 (thirty) days., Disp: 1 mL, Rfl:  11  PAST MEDICAL HISTORY: Past Medical History:  Diagnosis Date   Alcohol abuse    Anemia    Anxiety    Arthritis    Colon cancer (HCC)    Depression    Dysrhythmia    Gall stones    Heart burn    History of kidney stones    Hyperlipidemia    Hypertension    Hypothyroidism    Liver disease    Pernicious anemia    Tachycardia    Typical atrial flutter (HCC)    Vitamin B12 deficiency     PAST SURGICAL HISTORY: Past Surgical History:  Procedure Laterality Date   A-FLUTTER ABLATION N/A 07/31/2016   Procedure: A-Flutter Ablation;  Surgeon: Hillis Range, MD;  Location: [facility];  Service: Cardiovascular;  Laterality: N/A;   BIOPSY  05/08/2022   Procedure: BIOPSY;  Surgeon: Lynann Bologna, MD;  Location: [facility];  Service: Gastroenterology;;   COLONOSCOPY N/A 04/17/2016   Procedure: COLONOSCOPY;  Surgeon: Malissa Hippo, MD;  Location: [facility];  Service: Endoscopy;  Laterality: N/A;  1200   COLONOSCOPY N/A 05/07/2017   Procedure: COLONOSCOPY;  Surgeon: Malissa Hippo, MD;  [facility]: [facility];  Service: Endoscopy;  Laterality: N/A;  200   COLONOSCOPY WITH PROPOFOL N/A 07/25/2020   Procedure: COLONOSCOPY WITH PROPOFOL;  Surgeon: Malissa Hippo, MD;  [facility];  Service: Endoscopy;  Laterality: N/A;  12:10   ESOPHAGEAL BANDING  07/25/2020   Procedure: ESOPHAGEAL BANDING;  Surgeon: Malissa Hippo, MD;  [facility];  Service: Endoscopy;;   ESOPHAGOGASTRODUODENOSCOPY (EGD) WITH PROPOFOL N/A 07/25/2020   Procedure: ESOPHAGOGASTRODUODENOSCOPY (EGD) WITH PROPOFOL;  Surgeon: Malissa Hippo, MD;  [facility];  Service: Endoscopy;  Laterality: N/A;   ESOPHAGOGASTRODUODENOSCOPY (EGD) WITH PROPOFOL N/A 04/11/2021   Procedure: ESOPHAGOGASTRODUODENOSCOPY (EGD) WITH PROPOFOL;  Surgeon: Malissa Hippo, MD;  [facility];  Service: Endoscopy;  Laterality: N/A;  1:00   ESOPHAGOGASTRODUODENOSCOPY (EGD) WITH PROPOFOL N/A  05/08/2022   Procedure: ESOPHAGOGASTRODUODENOSCOPY (EGD) WITH PROPOFOL;  Surgeon: Lynann Bologna, MD;  Location: [facility];  Service: Gastroenterology;  Laterality: N/A;   implantable loop recorder placement  01/24/2019    Medtronic Reveal Linq model LNQ 11 implantable loop recorder (SN [id number] ) by Dr Johney Frame for evaluation of palpitations   MINOR CARPAL TUNNEL     right   POLYPECTOMY  04/17/2016   Procedure: POLYPECTOMY;  Surgeon: Malissa Hippo, MD;  [facility];  Service: Endoscopy;;  sigmoid   POLYPECTOMY  07/25/2020   Procedure: POLYPECTOMY;  Surgeon: Malissa Hippo, MD;  [facility];  Service: Endoscopy;;    FAMILY HISTORY: Family History  Problem Relation Age of Onset   Alzheimer's disease Mother    Hyperlipidemia Mother    Gallbladder disease Mother    Heart disease Father    Coronary artery disease Father    Liver disease Father    Thyroid disease Father    Hyperlipidemia Father    Heart attack Father    Cirrhosis Father    Gallbladder disease Sister    Coronary artery disease Paternal Grandfather     SOCIAL HISTORY:  Social History   Socioeconomic History   Marital status: Single    Spouse name: Not on file   Number of children: 0   Years of education: Not on file   Highest education level: Not on file  Occupational History   Occupation: retired/disabled  Tobacco Use   Smoking status: Former    Types: Cigarettes    Quit date: 03/03/1979    Years since quitting: 43.3   Smokeless tobacco: Never  Vaping Use   Vaping Use: Never used  Substance and Sexual Activity   Alcohol use: Not Currently    Alcohol/week: 8.0 standard drinks of alcohol    Types: 8 Cans of beer per week    Comment: every other day   Drug use: No   Sexual activity: Not on file  Other Topics Concern   Not on file  Social History Narrative   Disabled   Lives in [place]   Social Determinants of Health   Financial Resource Strain: Not on file  Food  Insecurity: Not on file  Transportation Needs: Not on file  Physical Activity: Not on file  Stress: Not on file  Social Connections: Not on file  Intimate Partner Violence: Not on file     PHYSICAL EXAM  Vitals:   06/10/22 1121 06/10/22 1129  BP: (!) 153/84 126/78  Pulse:  (!) 50  Weight: 254 lb (115.2 kg)   Height: 5\' 10"  (1.778 m)     Body mass index is 36.45 kg/m.   General: The patient is well-developed and well-nourished and in no acute distress  Eyes:  Funduscopic exam shows normal optic discs and retinal vessels.  Neck: The neck is supple, no carotid bruits are noted.  The neck is nontender.  Cardiovascular: The heart has a regular rate and rhythm with a normal S1 and S2. There were no murmurs, gallops or rubs.    Skin: Extremities are without significant edema.  Musculoskeletal:  Back is nontender  Neurologic Exam  Mental status: The patient is alert and oriented x 3 at the time of the examination. The patient has apparent normal recent and remote memory, with an apparently normal attention span and concentration ability.   Speech is normal.  Cranial nerves: Extraocular movements are full. Pupils are equal, round, and reactive to light and accomodation.  Visual fields are full.  Minimal left ptosis.  Of note, there is evidence of cross reinnervation with eye closure upon smiling and the left corner of the mouth going up when he closes his eyes.  Frequent left hemifacial spasms are noted.  There is good facial sensation to soft touch bilaterally  No obvious hearing deficits are noted.  Motor:  Muscle bulk is normal.   Tone is normal. Strength is  5 / 5 in all 4 extremities.   Sensory:  Sensory testing shows reduced sensation to pinprick in 4th/5th fingers.   Tinel's sign at left wrist and left . Right elbo.   Phalens sign to left  Coordination: Cerebellar testing reveals good finger-nose-finger and heel-to-shin bilaterally.  Gait and station: Station is normal.    Gait is normal. Tandem gait is slightly wide. Romberg is negative.   Reflexes: Deep tendon reflexes are symmetric and normal bilaterally.   Plantar responses are flexor.    DIAGNOSTIC DATA (LABS, IMAGING, TESTING) - I reviewed patient records, labs, notes, testing and imaging myself where available.  Lab Results  Component Value Date   WBC 5.7 04/11/2022   HGB 16.6 04/11/2022   HCT 48.2 04/11/2022   MCV 94.6 04/11/2022   PLT 112.0 (L) 04/11/2022      Component Value Date/Time   NA 140 04/11/2022 1110   NA 140 06/27/2021 1227   K 3.8 04/11/2022 1110   CL 103 04/11/2022 1110   CO2 27 04/11/2022 1110   GLUCOSE 102 (H) 04/11/2022 1110   BUN 14 04/11/2022 1110   BUN 13 06/27/2021 1227   CREATININE 1.09 04/11/2022 1110   CREATININE 0.83 07/18/2020 1304   CALCIUM 9.3 04/11/2022 1110   PROT 7.7 04/11/2022 1110   PROT 7.5 06/27/2021 1227   ALBUMIN 4.0 04/11/2022 1110   ALBUMIN 4.1 06/27/2021 1227   AST 32 04/11/2022 1110   ALT 26 04/11/2022 1110   ALKPHOS 100 04/11/2022 1110   BILITOT 0.8 04/11/2022 1110   BILITOT 0.9 06/27/2021 1227   GFRNONAA >60 11/25/2021 2112   GFRNONAA 71 09/06/2019 1313   GFRAA 82 09/06/2019 1313   Lab Results  Component Value Date   CHOL 211 (H) 03/02/2019   HDL 27 (L) 03/02/2019   LDLCALC 152 (H) 03/02/2019   TRIG 174 (H) 03/02/2019   CHOLHDL 7.8 (H) 03/02/2019   Lab Results  Component Value Date   HGBA1C 4.5 (L) 07/26/2019   Lab Results  Component Value Date   VITAMINB12 186 (L) 03/06/2021   Lab Results  Component Value Date   TSH 3.790 03/06/2021       ASSESSMENT AND PLAN  Bilateral carpal tunnel syndrome  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 Keppra for HFS.  We also  discussed referral to Neurosurgery for decompression of left 7th nerve (AICA may be  compressing) He quit his B12 shots but feels numbness is worse.   We will recheck and reorder injections (he got the injections with  the pharmacist). Rtc 6 months or sooner if new or worsening issues.    Viraj Liby A. Epimenio Foot, MD, [facility] 06/10/2022, 12:11 PM Certified in Neurology, Clinical Neurophysiology, Sleep Medicine, Pain Medicine and Neuroimaging  [facility] [address] [place], [place] [address] [phone]

## 2022-06-11 LAB — VITAMIN B12: Vitamin B-12: 641 pg/mL (ref 232–1245)

## 2022-06-11 LAB — VITAMIN D 25 HYDROXY (VIT D DEFICIENCY, FRACTURES): Vit D, 25-Hydroxy: 42.5 ng/mL (ref 30.0–100.0)

## 2022-06-11 NOTE — Progress Notes (Signed)
Carelink Summary Report / Loop Recorder 

## 2022-06-12 ENCOUNTER — Ambulatory Visit (HOSPITAL_COMMUNITY): Admission: RE | Admit: 2022-06-12 | Payer: Medicaid Other | Source: Ambulatory Visit

## 2022-06-12 ENCOUNTER — Ambulatory Visit (HOSPITAL_COMMUNITY)
Admission: RE | Admit: 2022-06-12 | Discharge: 2022-06-12 | Disposition: A | Discharge status: Home / Self Care | Payer: Medicaid Other | Source: Ambulatory Visit | Attending: Nurse Practitioner | Admitting: Nurse Practitioner

## 2022-06-12 ENCOUNTER — Ambulatory Visit (HOSPITAL_COMMUNITY): Payer: Medicaid Other

## 2022-06-12 DIAGNOSIS — K7030 Alcoholic cirrhosis of liver without ascites: Secondary | ICD-10-CM | POA: Insufficient documentation

## 2022-06-12 MED ORDER — GADOBUTROL 1 MMOL/ML IV SOLN
10.0000 mL | Freq: Once | INTRAVENOUS | Status: AC | PRN
  Administered 2022-06-12: 10 mL via INTRAVENOUS

## 2022-06-20 ENCOUNTER — Other Ambulatory Visit (HOSPITAL_COMMUNITY): Payer: Medicaid Other

## 2022-06-23 ENCOUNTER — Other Ambulatory Visit: Payer: Self-pay | Admitting: Family

## 2022-06-25 ENCOUNTER — Other Ambulatory Visit: Payer: Self-pay | Admitting: Neurology

## 2022-06-25 NOTE — Telephone Encounter (Signed)
Dr.Camara you are work in pm provider, Dr.Sater is out of the office today. Last seen on 06/10/22 Follow up scheduled on 06/11/22 with Dr.Sater  Rx last filled on 06/05/22 #30 tablets (30 day supply)  Note attached to refill request from pharmacy (This prescription was filled on 06/05/2022. Any refills authorized will be placed on file.)  Rx pending to be signed

## 2022-06-27 ENCOUNTER — Ambulatory Visit (HOSPITAL_COMMUNITY): Payer: Medicaid Other

## 2022-06-30 ENCOUNTER — Telehealth: Payer: Self-pay | Admitting: Neurology

## 2022-06-30 DIAGNOSIS — R259 Unspecified abnormal involuntary movements: Secondary | ICD-10-CM

## 2022-06-30 DIAGNOSIS — G5132 Clonic hemifacial spasm, left: Secondary | ICD-10-CM

## 2022-06-30 NOTE — Telephone Encounter (Signed)
Dr. Epimenio Foot- what would you recommend? [facility] will not allow me to see any notes on him.  Called pt back. He reports twitching started around left eye and has now progressed into bottom of neck/collar bone area. Feels left side of face paralyzed. Does not fel Botox would effectively treat sx at this point. He is requesting surgery now after initially declining. He has also started to have trouble swallowing in past 2-3 days. This is intermittent but new sx for him. Denies any difficulty eating/drinking currently. Having vision problems in left eye d/t movements. Cannot focus well. Also feels vision affected in right eye. Aware I will send to MD to review and we will call back.

## 2022-06-30 NOTE — Telephone Encounter (Signed)
Pt stated she needs to talk to nurse about twitching. Stated it starting to left eye to bottom of his neck. Stated he can't see at times. Stated the whole left side of face moves on it on.

## 2022-07-01 ENCOUNTER — Telehealth: Payer: Self-pay | Admitting: Neurology

## 2022-07-01 NOTE — Telephone Encounter (Signed)
Referral faxed to [facility]: Phone: [phone]  Fax:[phone] 

## 2022-07-01 NOTE — Telephone Encounter (Signed)
Placed referral to Neurosurgery per MD request

## 2022-07-01 NOTE — Telephone Encounter (Signed)
Called and spoke w/ pt. Relayed we placed referral to Dr. Maurice Small and referral sen this am. Aware they will call him to schedule appt. He will call back if he does not hear about getting scheduled.

## 2022-07-03 ENCOUNTER — Encounter: Payer: Medicaid Other | Admitting: Diagnostic Neuroimaging

## 2022-07-03 NOTE — Progress Notes (Signed)
Carelink Summary Report / Loop Recorder 

## 2022-07-07 ENCOUNTER — Encounter: Payer: Self-pay | Admitting: *Deleted

## 2022-07-09 ENCOUNTER — Ambulatory Visit (INDEPENDENT_AMBULATORY_CARE_PROVIDER_SITE_OTHER): Payer: Medicaid Other

## 2022-07-09 DIAGNOSIS — I484 Atypical atrial flutter: Secondary | ICD-10-CM

## 2022-07-11 ENCOUNTER — Other Ambulatory Visit: Payer: Medicaid Other

## 2022-07-11 DIAGNOSIS — K7030 Alcoholic cirrhosis of liver without ascites: Secondary | ICD-10-CM

## 2022-07-11 LAB — CUP PACEART REMOTE DEVICE CHECK
Date Time Interrogation Session: 20240612231439
Implantable Pulse Generator Implant Date: 20201228

## 2022-07-14 ENCOUNTER — Other Ambulatory Visit: Payer: Self-pay | Admitting: *Deleted

## 2022-07-14 LAB — AFP TUMOR MARKER: AFP-Tumor Marker: 3.6 ng/mL (ref <6.1)

## 2022-07-14 MED ORDER — PANTOPRAZOLE SODIUM 40 MG PO TBEC: 40.0000 mg | DELAYED_RELEASE_TABLET | Freq: Every day | ORAL | 0 refills | Status: DC

## 2022-07-21 ENCOUNTER — Ambulatory Visit: Payer: Medicaid Other | Admitting: Neurology

## 2022-07-21 ENCOUNTER — Ambulatory Visit (INDEPENDENT_AMBULATORY_CARE_PROVIDER_SITE_OTHER): Payer: Medicaid Other | Admitting: Neurology

## 2022-07-21 DIAGNOSIS — G5602 Carpal tunnel syndrome, left upper limb: Secondary | ICD-10-CM | POA: Diagnosis not present

## 2022-07-21 DIAGNOSIS — G5603 Carpal tunnel syndrome, bilateral upper limbs: Secondary | ICD-10-CM

## 2022-07-21 NOTE — Progress Notes (Signed)
Patient declined study of right hand and needle exam.

## 2022-07-22 NOTE — Progress Notes (Signed)
        Full Name: Earl [name] Gender: Male MRN #: [id number] Date of Birth: 08-01-1960    Visit Date: 07/21/2022 12:39 Age: 62 Years Examining Physician: Dr. Naomie Dean Referring Physician: Dr. Despina Arias Height: 5 feet 10 inch  History: Evaluation of CTS. Patient declined EMG/Needle study. Patient declined testing of his right arm   Summary:    The left  Median 2nd Digit orthodromic sensory nerve showed prolonged distal peak latency (3.8uV ms, N<3.4) and reduced amplitude(8 uV, N>10)  The left median/ulnar (palm) comparison nerve showed prolonged distal peak latency (Median Palm, 2.8 ms, N<2.2) and abnormal peak latency difference (Median Palm-Ulnar Palm, 0.9 ms, N<0.4) with a relative median delay.    All remaining nerves (as indicated in the following tables) were within normal limits.  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 Patient declined EMG/Needle study. Patient declined testing of his right arm   ------------------------------- Naomie Dean, M.D.  [facility] [address] [place], [place] [address] Tel: [phone] Fax: [phone]  Verbal informed consent was obtained from the patient, patient was informed of potential risk of procedure, including bruising, bleeding, hematoma formation, infection, muscle weakness, muscle pain, numbness, among others.        MNC    Nerve / Sites Muscle Latency Ref. Amplitude Ref. Rel Amp Segments Distance Velocity Ref. Area    ms ms mV mV %  cm m/s m/s mVms  L Median - APB     Wrist APB 3.9 ?4.4 6.4 ?4.0 100 Wrist - APB 7   18.3     Upper arm APB 8.8  5.9  92.4 Upper arm - Wrist 24 50 ?49 18.4  L Ulnar - ADM     Wrist ADM 2.7 ?3.3 11.6 ?6.0 100 Wrist - ADM 7   42.3     B.Elbow ADM 4.7  11.6  99.3 B.Elbow - Wrist 11 56 ?49 39.7     A.Elbow ADM 7.6  11.1  96.3 A.Elbow - B.Elbow 16  54 ?49 39.4         SNC    Nerve / Sites Rec. Site Peak Lat Ref.  Amp Ref. Segments Distance Peak Diff Ref.    ms ms V V  cm ms ms  L Median, Ulnar - Transcarpal comparison     Median Palm Wrist 2.8 ?2.2 15 ?35 Median Palm - Wrist 8       Ulnar Palm Wrist 1.9 ?2.2 23 ?12 Ulnar Palm - Wrist 8          Median Palm - Ulnar Palm  0.9 ?0.4  L Median - Orthodromic (Dig II, Mid palm)     Dig II Wrist 3.8 ?3.4 8 ?10 Dig II - Wrist 13    L Ulnar - Orthodromic, (Dig V, Mid palm)     Dig V Wrist 2.9 ?3.1 7 ?5 Dig V - Wrist 11

## 2022-07-22 NOTE — Procedures (Signed)
        Full Name: Earl [name] Gender: Male MRN #: [id number] Date of Birth: 05/23/1960    Visit Date: 07/21/2022 12:39 Age: 62 Years Examining Physician: Dr. Audy Dauphine Referring Physician: Dr. Richard Sater Height: 5 feet 10 inch  History: Evaluation of CTS. Patient declined EMG/Needle study. Patient declined testing of his right arm   Summary:    The left  Median 2nd Digit orthodromic sensory nerve showed prolonged distal peak latency (3.8uV ms, N<3.4) and reduced amplitude(8 uV, N>10)  The left median/ulnar (palm) comparison nerve showed prolonged distal peak latency (Median Palm, 2.8 ms, N<2.2) and abnormal peak latency difference (Median Palm-Ulnar Palm, 0.9 ms, N<0.4) with a relative median delay.    All remaining nerves (as indicated in the following tables) were within normal limits.  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 Patient declined EMG/Needle study. Patient declined testing of his right arm   ------------------------------- Charlie Char, M.D.  [facility] [address], [address] [place], [place] [address] Tel: [phone] Fax: [phone]  Verbal informed consent was obtained from the patient, patient was informed of potential risk of procedure, including bruising, bleeding, hematoma formation, infection, muscle weakness, muscle pain, numbness, among others.        MNC    Nerve / Sites Muscle Latency Ref. Amplitude Ref. Rel Amp Segments Distance Velocity Ref. Area    ms ms mV mV %  cm m/s m/s mVms  L Median - APB     Wrist APB 3.9 ?4.4 6.4 ?4.0 100 Wrist - APB 7   18.3     Upper arm APB 8.8  5.9  92.4 Upper arm - Wrist 24 50 ?49 18.4  L Ulnar - ADM     Wrist ADM 2.7 ?3.3 11.6 ?6.0 100 Wrist - ADM 7   42.3     B.Elbow ADM 4.7  11.6  99.3 B.Elbow - Wrist 11 56 ?49 39.7     A.Elbow ADM 7.6  11.1  96.3 A.Elbow - B.Elbow 16  54 ?49 39.4         SNC    Nerve / Sites Rec. Site Peak Lat Ref.  Amp Ref. Segments Distance Peak Diff Ref.    ms ms V V  cm ms ms  L Median, Ulnar - Transcarpal comparison     Median Palm Wrist 2.8 ?2.2 15 ?35 Median Palm - Wrist 8       Ulnar Palm Wrist 1.9 ?2.2 23 ?12 Ulnar Palm - Wrist 8          Median Palm - Ulnar Palm  0.9 ?0.4  L Median - Orthodromic (Dig II, Mid palm)     Dig II Wrist 3.8 ?3.4 8 ?10 Dig II - Wrist 13    L Ulnar - Orthodromic, (Dig V, Mid palm)     Dig V Wrist 2.9 ?3.1 7 ?5 Dig V - Wrist 11             

## 2022-07-28 NOTE — Telephone Encounter (Signed)
Earl [name], pls have patient follow up with PCP regarding fluid intake concerns, swelling and BP management. I am adding Dr. Chales Abrahams so he can weigh in as well. THX.

## 2022-08-01 ENCOUNTER — Other Ambulatory Visit: Payer: Self-pay | Admitting: Family

## 2022-08-01 DIAGNOSIS — K7030 Alcoholic cirrhosis of liver without ascites: Secondary | ICD-10-CM

## 2022-08-02 ENCOUNTER — Telehealth: Payer: Self-pay | Admitting: Neurology

## 2022-08-02 NOTE — Telephone Encounter (Signed)
He saw Dr. Johnsie Cancel of neurosurgery for his hemifacial spasm likely due to AICA or vertebral artery compression.  Due to his multiple medical issues including cirrhosis with varices risk of surgery was felt to be too high and he was referred to Dr. Westley Foots for Botox therapy.

## 2022-08-04 ENCOUNTER — Other Ambulatory Visit (HOSPITAL_COMMUNITY): Payer: Self-pay

## 2022-08-04 ENCOUNTER — Telehealth: Payer: Self-pay | Admitting: Neurology

## 2022-08-04 ENCOUNTER — Telehealth: Payer: Self-pay

## 2022-08-04 NOTE — Telephone Encounter (Signed)
Pt said pharmacy needs PA for LYRICA 200 MG capsule. Would like a call back. Phone [employer] [phone]

## 2022-08-04 NOTE — Progress Notes (Signed)
Carelink Summary Report / Loop Recorder 

## 2022-08-04 NOTE — Telephone Encounter (Signed)
I received a request to submit PA for this PT-I can not access any chart notes done by Dr. Epimenio Foot for the dates of 05/19/2017, 12/10/2021 and 06/10/2022. I am unable to correctly do the PA or find the answers to the PA questions at this time.   CMM Key: [id number]

## 2022-08-11 ENCOUNTER — Ambulatory Visit (INDEPENDENT_AMBULATORY_CARE_PROVIDER_SITE_OTHER): Payer: MEDICAID

## 2022-08-11 DIAGNOSIS — R002 Palpitations: Secondary | ICD-10-CM | POA: Diagnosis not present

## 2022-08-12 LAB — CUP PACEART REMOTE DEVICE CHECK
Date Time Interrogation Session: 20240715233234
Implantable Pulse Generator Implant Date: 20201228

## 2022-08-19 ENCOUNTER — Ambulatory Visit: Payer: Medicaid Other | Admitting: Family

## 2022-08-19 NOTE — Telephone Encounter (Signed)
   Unable to access office notes to do PA-will archive PA in [facility].

## 2022-08-21 ENCOUNTER — Ambulatory Visit: Payer: Medicaid Other | Admitting: Family

## 2022-08-21 ENCOUNTER — Other Ambulatory Visit: Payer: Self-pay | Admitting: Diagnostic Neuroimaging

## 2022-08-21 DIAGNOSIS — E538 Deficiency of other specified B group vitamins: Secondary | ICD-10-CM

## 2022-08-21 DIAGNOSIS — F1020 Alcohol dependence, uncomplicated: Secondary | ICD-10-CM

## 2022-08-21 DIAGNOSIS — G629 Polyneuropathy, unspecified: Secondary | ICD-10-CM

## 2022-08-21 DIAGNOSIS — R259 Unspecified abnormal involuntary movements: Secondary | ICD-10-CM

## 2022-08-21 NOTE — Telephone Encounter (Signed)
Requested Prescriptions   Pending Prescriptions Disp Refills   LYRICA 200 MG capsule [Pharmacy Med Name: Lyrica 200 mg capsule] 90 capsule 2    Sig: TAKE THREE CAPSULES BY MOUTH DAILY   Last seen 07/21/22, next appt 06/11/23  Dispenses  The pt just received 03 days on 08/04/22 shouldn't qualify for more until 09/04/22 Dispensed Days Supply Quantity Provider Pharmacy  pregabalin 200 mg capsule 08/04/2022 30 90 each Penumalli, Glenford Bayley, MD [facility] - [place], ...  Lyrica 200 mg capsule 07/03/2022 30 90 each Penumalli, Glenford Bayley, MD [facility] - [place], ...  Lyrica 200 mg capsule 06/05/2022 30 90 each Penumalli, Glenford Bayley, MD [facility] - [place], ...  Lyrica 200 mg capsule 05/06/2022 30 90 each Sater, Pearletha Furl, MD [facility] - [place], ...  Lyrica 200 mg capsule 04/04/2022 30 90 each Sater, Pearletha Furl, MD [facility] - [place], ...  Lyrica 200 mg capsule 03/06/2022 30 90 each Sater, Pearletha Furl, MD [facility] - [place], ...  Lyrica 200 mg capsule 02/05/2022 30 90 each Sater, Pearletha Furl, MD [facility] - [place], ...  Lyrica 200 mg capsule 01/06/2022 30 90 each Sater, Pearletha Furl, MD [facility] - [place], ...  Lyrica 200 mg capsule 12/10/2021 30 90 each Sater, Pearletha Furl, MD [facility] [place], ...  pregabalin 200 mg capsule 11/11/2021 30 90 each Hawks, Edilia Bo, FNP [facility] [place], ...  pregabalin 200 mg capsule 10/09/2021 30 90 each Hawks, Edilia Bo, FNP [facility] [place], ...  pregabalin 200 mg capsule 09/24/2021 15 45 each Beryle Beams, MD [facility] - [facility] .Marland KitchenMarland Kitchen

## 2022-08-25 NOTE — Progress Notes (Signed)
Carelink Summary Report / Loop Recorder 

## 2022-08-26 ENCOUNTER — Telehealth: Payer: Self-pay | Admitting: Neurology

## 2022-08-26 NOTE — Telephone Encounter (Signed)
Pt called stating that the office of Dr. Maurice Small is needing the test results for his carpal tunnel faxed to them.

## 2022-08-29 ENCOUNTER — Other Ambulatory Visit: Payer: Self-pay | Admitting: Diagnostic Neuroimaging

## 2022-08-29 ENCOUNTER — Other Ambulatory Visit: Payer: Self-pay | Admitting: Neurology

## 2022-08-29 DIAGNOSIS — R259 Unspecified abnormal involuntary movements: Secondary | ICD-10-CM

## 2022-08-29 DIAGNOSIS — E538 Deficiency of other specified B group vitamins: Secondary | ICD-10-CM

## 2022-08-29 DIAGNOSIS — F1020 Alcohol dependence, uncomplicated: Secondary | ICD-10-CM

## 2022-08-29 DIAGNOSIS — G629 Polyneuropathy, unspecified: Secondary | ICD-10-CM

## 2022-09-05 ENCOUNTER — Encounter: Payer: Self-pay | Admitting: Family

## 2022-09-05 ENCOUNTER — Other Ambulatory Visit: Payer: Self-pay | Admitting: Family

## 2022-09-05 ENCOUNTER — Ambulatory Visit: Payer: MEDICAID | Admitting: Family

## 2022-09-05 VITALS — BP 120/77 | HR 58 | Temp 97.3°F | Ht 70.0 in | Wt 236.0 lb

## 2022-09-05 DIAGNOSIS — I480 Paroxysmal atrial fibrillation: Secondary | ICD-10-CM

## 2022-09-05 DIAGNOSIS — K7031 Alcoholic cirrhosis of liver with ascites: Secondary | ICD-10-CM

## 2022-09-05 DIAGNOSIS — H6123 Impacted cerumen, bilateral: Secondary | ICD-10-CM

## 2022-09-05 DIAGNOSIS — K7030 Alcoholic cirrhosis of liver without ascites: Secondary | ICD-10-CM

## 2022-09-05 DIAGNOSIS — F339 Major depressive disorder, recurrent, unspecified: Secondary | ICD-10-CM | POA: Diagnosis not present

## 2022-09-05 DIAGNOSIS — Z0000 Encounter for general adult medical examination without abnormal findings: Secondary | ICD-10-CM

## 2022-09-05 DIAGNOSIS — G629 Polyneuropathy, unspecified: Secondary | ICD-10-CM

## 2022-09-05 DIAGNOSIS — I10 Essential (primary) hypertension: Secondary | ICD-10-CM | POA: Diagnosis not present

## 2022-09-05 DIAGNOSIS — I484 Atypical atrial flutter: Secondary | ICD-10-CM

## 2022-09-05 DIAGNOSIS — E782 Mixed hyperlipidemia: Secondary | ICD-10-CM | POA: Diagnosis not present

## 2022-09-05 DIAGNOSIS — Z0001 Encounter for general adult medical examination with abnormal findings: Secondary | ICD-10-CM | POA: Diagnosis not present

## 2022-09-05 DIAGNOSIS — E038 Other specified hypothyroidism: Secondary | ICD-10-CM

## 2022-09-05 DIAGNOSIS — F1011 Alcohol abuse, in remission: Secondary | ICD-10-CM

## 2022-09-05 DIAGNOSIS — E559 Vitamin D deficiency, unspecified: Secondary | ICD-10-CM

## 2022-09-05 DIAGNOSIS — E538 Deficiency of other specified B group vitamins: Secondary | ICD-10-CM

## 2022-09-05 DIAGNOSIS — R600 Localized edema: Secondary | ICD-10-CM

## 2022-09-05 NOTE — Progress Notes (Signed)
Subjective:    Patient ID: Earl [name], male    DOB: 12-12-1960, 62 y.o.   MRN: [id number]  Chief Complaint  Patient presents with   Medical Management of Chronic Issues    Wants ears cleaned    Pt presents to the office today for chronic follow up. He is followed by GI for alcoholic cirrhosis and malignant neoplasm of colon.  He quit the Pain Clinic once a month for chronic pain and neuropathy.  He is followed by Cardiologists for NSVT and atrial flutter.   He is followed by Neurologists for neuropathy who gives him Lyrica.    He states he stopped drinking alcohol 12/09/21.   He is obese with a BMI of 33 with hyperlipidemia and depression.    He is currently taking lasix 20 mg, but states he takes 40 mg as needed when he gains fluid. Hypertension This is a chronic problem. The current episode started more than 1 year ago. The problem has been resolved since onset. The problem is controlled. Associated symptoms include peripheral edema (left leg). Pertinent negatives include no malaise/fatigue or shortness of breath. Risk factors for coronary artery disease include dyslipidemia, obesity, male gender and sedentary lifestyle. The current treatment provides moderate improvement. Identifiable causes of hypertension include a thyroid problem.  Thyroid Problem Presents for follow-up visit. Symptoms include dry skin. Patient reports no anxiety, constipation or diarrhea. The symptoms have been stable. His past medical history is significant for hyperlipidemia.  Hyperlipidemia This is a chronic problem. The current episode started more than 1 year ago. Exacerbating diseases include obesity. Pertinent negatives include no shortness of breath. Current antihyperlipidemic treatment includes diet change. The current treatment provides no improvement of lipids. Risk factors for coronary artery disease include dyslipidemia, hypertension, a sedentary lifestyle and male sex.  Depression        This  is a chronic problem.  The current episode started more than 1 year ago.   The problem occurs intermittently.  Associated symptoms include no helplessness, no hopelessness and not sad.  Past medical history includes thyroid problem.       Review of Systems  Constitutional:  Negative for malaise/fatigue.  Respiratory:  Negative for shortness of breath.   Gastrointestinal:  Negative for constipation and diarrhea.  Psychiatric/Behavioral:  Positive for depression. The patient is not nervous/anxious.   All other systems reviewed and are negative.  Family History  Problem Relation Age of Onset   Alzheimer's disease Mother    Hyperlipidemia Mother    Gallbladder disease Mother    Heart disease Father    Coronary artery disease Father    Liver disease Father    Thyroid disease Father    Hyperlipidemia Father    Heart attack Father    Cirrhosis Father    Gallbladder disease Sister    Coronary artery disease Paternal Grandfather    Social History   Socioeconomic History   Marital status: Single    Spouse name: Not on file   Number of children: 0   Years of education: Not on file   Highest education level: Not on file  Occupational History   Occupation: retired/disabled  Tobacco Use   Smoking status: Former    Current packs/day: 0.00    Types: Cigarettes    Quit date: 03/03/1979    Years since quitting: 43.5   Smokeless tobacco: Never  Vaping Use   Vaping status: Never Used  Substance and Sexual Activity   Alcohol use: Not Currently  Alcohol/week: 8.0 standard drinks of alcohol    Types: 8 Cans of beer per week    Comment: every other day   Drug use: No   Sexual activity: Not on file  Other Topics Concern   Not on file  Social History Narrative   Disabled   Lives in [place]   Social Determinants of Health   Financial Resource Strain: Not on file  Food Insecurity: Not on file  Transportation Needs: Not on file  Physical Activity: Not on file  Stress: Not on file   Social Connections: Not on file       Objective:   Physical Exam Vitals reviewed.  Constitutional:      General: He is not in acute distress.    Appearance: He is well-developed. He is obese.  HENT:     Head: Normocephalic.     Right Ear: There is impacted cerumen.     Left Ear: There is impacted cerumen.  Eyes:     General:        Right eye: No discharge.        Left eye: No discharge.     Pupils: Pupils are equal, round, and reactive to light.  Neck:     Thyroid: No thyromegaly.  Cardiovascular:     Rate and Rhythm: Normal rate and regular rhythm.     Heart sounds: Normal heart sounds. No murmur heard. Pulmonary:     Effort: Pulmonary effort is normal. No respiratory distress.     Breath sounds: Normal breath sounds. No wheezing.  Abdominal:     General: Bowel sounds are normal. There is no distension.     Palpations: Abdomen is soft.     Tenderness: There is no abdominal tenderness.  Musculoskeletal:        General: No tenderness. Normal range of motion.     Cervical back: Normal range of motion and neck supple.  Skin:    General: Skin is warm and dry.     Findings: No erythema or rash.  Neurological:     Mental Status: He is alert and oriented to person, place, and time.     Cranial Nerves: No cranial nerve deficit.     Deep Tendon Reflexes: Reflexes are normal and symmetric.     Comments: twitch of left eye  Psychiatric:        Behavior: Behavior normal.        Thought Content: Thought content normal.        Judgment: Judgment normal.     Bilateral ears washed with warm water and peroxide. TM WNL  BP 120/77   Pulse (!) 58   Temp (!) 97.3 F (36.3 C) (Temporal)   Ht 5\' 10"  (1.778 m)   Wt 236 lb (107 kg)   SpO2 97%   BMI 33.86 kg/m      Assessment & Plan:  SHYAM HEGLUND comes in today with chief complaint of Medical Management of Chronic Issues (Wants ears cleaned )   Diagnosis and orders addressed:  1. Annual physical exam - CMP14+EGFR -  CBC with Differential/Platelet - Lipid panel - PSA, total and free - Vitamin B12 - VITAMIN D 25 Hydroxy (Vit-D Deficiency, Fractures) - TSH  2. Alcoholic cirrhosis of liver with ascites (HCC) - CMP14+EGFR - CBC with Differential/Platelet  3. Atypical atrial flutter (HCC) - CMP14+EGFR - CBC with Differential/Platelet  4. Depression, recurrent (HCC) - CMP14+EGFR - CBC with Differential/Platelet  5. Essential hypertension - CMP14+EGFR - CBC with Differential/Platelet  6. Mixed hyperlipidemia - CMP14+EGFR - CBC with Differential/Platelet - Lipid panel  7. Morbid obesity (HCC) - CMP14+EGFR - CBC with Differential/Platelet  8. Neuropathy - CMP14+EGFR - CBC with Differential/Platelet  9. Vitamin D deficiency - CMP14+EGFR - CBC with Differential/Platelet - VITAMIN D 25 Hydroxy (Vit-D Deficiency, Fractures)  10. Vitamin B 12 deficiency - CMP14+EGFR - CBC with Differential/Platelet - Vitamin B12  11. Subclinical hypothyroidism - CMP14+EGFR - CBC with Differential/Platelet - TSH  12. Paroxysmal atrial fibrillation (HCC) - CMP14+EGFR - CBC with Differential/Platelet  13. Alcohol abuse, in remission - CMP14+EGFR - CBC with Differential/Platelet  14. Bilateral impacted cerumen - WNL  Labs pending Health Maintenance reviewed Diet and exercise encouraged  Follow up plan: 6 months   Jannifer Rodney, FNP

## 2022-09-05 NOTE — Patient Instructions (Signed)
Health Maintenance, Male Adopting a healthy lifestyle and getting preventive care are important in promoting health and wellness. Ask your health care provider about: The right schedule for you to have regular tests and exams. Things you can do on your own to prevent diseases and keep yourself healthy. What should I know about diet, weight, and exercise? Eat a healthy diet  Eat a diet that includes plenty of vegetables, fruits, low-fat dairy products, and lean protein. Do not eat a lot of foods that are high in solid fats, added sugars, or sodium. Maintain a healthy weight Body mass index (BMI) is a measurement that can be used to identify possible weight problems. It estimates body fat based on height and weight. Your health care provider can help determine your BMI and help you achieve or maintain a healthy weight. Get regular exercise Get regular exercise. This is one of the most important things you can do for your health. Most adults should: Exercise for at least 150 minutes each week. The exercise should increase your heart rate and make you sweat (moderate-intensity exercise). Do strengthening exercises at least twice a week. This is in addition to the moderate-intensity exercise. Spend less time sitting. Even light physical activity can be beneficial. Watch cholesterol and blood lipids Have your blood tested for lipids and cholesterol at 63 years of age, then have this test every 5 years. You may need to have your cholesterol levels checked more often if: Your lipid or cholesterol levels are high. You are older than 63 years of age. You are at high risk for heart disease. What should I know about cancer screening? Many types of cancers can be detected early and may often be prevented. Depending on your health history and family history, you may need to have cancer screening at various ages. This may include screening for: Colorectal cancer. Prostate cancer. Skin cancer. Lung  cancer. What should I know about heart disease, diabetes, and high blood pressure? Blood pressure and heart disease High blood pressure causes heart disease and increases the risk of stroke. This is more likely to develop in people who have high blood pressure readings or are overweight. Talk with your health care provider about your target blood pressure readings. Have your blood pressure checked: Every 3-5 years if you are 18-39 years of age. Every year if you are 40 years old or older. If you are between the ages of 65 and 75 and are a current or former smoker, ask your health care provider if you should have a one-time screening for abdominal aortic aneurysm (AAA). Diabetes Have regular diabetes screenings. This checks your fasting blood sugar level. Have the screening done: Once every three years after age 45 if you are at a normal weight and have a low risk for diabetes. More often and at a younger age if you are overweight or have a high risk for diabetes. What should I know about preventing infection? Hepatitis B If you have a higher risk for hepatitis B, you should be screened for this virus. Talk with your health care provider to find out if you are at risk for hepatitis B infection. Hepatitis C Blood testing is recommended for: Everyone born from 1945 through 1965. Anyone with known risk factors for hepatitis C. Sexually transmitted infections (STIs) You should be screened each year for STIs, including gonorrhea and chlamydia, if: You are sexually active and are younger than 63 years of age. You are older than 63 years of age and your   health care provider tells you that you are at risk for this type of infection. Your sexual activity has changed since you were last screened, and you are at increased risk for chlamydia or gonorrhea. Ask your health care provider if you are at risk. Ask your health care provider about whether you are at high risk for HIV. Your health care provider  may recommend a prescription medicine to help prevent HIV infection. If you choose to take medicine to prevent HIV, you should first get tested for HIV. You should then be tested every 3 months for as long as you are taking the medicine. Follow these instructions at home: Alcohol use Do not drink alcohol if your health care provider tells you not to drink. If you drink alcohol: Limit how much you have to 0-2 drinks a day. Know how much alcohol is in your drink. In the [place] one drink equals one 12 oz bottle of beer (355 mL), one 5 oz glass of wine (148 mL), or one 1 oz glass of hard liquor (44 mL). Lifestyle Do not use any products that contain nicotine or tobacco. These products include cigarettes, chewing tobacco, and vaping devices, such as e-cigarettes. If you need help quitting, ask your health care provider. Do not use street drugs. Do not share needles. Ask your health care provider for help if you need support or information about quitting drugs. General instructions Schedule regular health, dental, and eye exams. Stay current with your vaccines. Tell your health care provider if: You often feel depressed. You have ever been abused or do not feel safe at home. Summary Adopting a healthy lifestyle and getting preventive care are important in promoting health and wellness. Follow your health care provider's instructions about healthy diet, exercising, and getting tested or screened for diseases. Follow your health care provider's instructions on monitoring your cholesterol and blood pressure. This information is not intended to replace advice given to you by your health care provider. Make sure you discuss any questions you have with your health care provider. Document Revised: 06/04/2020 Document Reviewed: 06/04/2020 Elsevier Patient Education  2024 Elsevier Inc.  

## 2022-09-11 ENCOUNTER — Ambulatory Visit (INDEPENDENT_AMBULATORY_CARE_PROVIDER_SITE_OTHER): Payer: MEDICAID

## 2022-09-11 DIAGNOSIS — I471 Supraventricular tachycardia, unspecified: Secondary | ICD-10-CM | POA: Diagnosis not present

## 2022-09-12 MED ORDER — FUROSEMIDE 40 MG PO TABS
40.0000 mg | ORAL_TABLET | Freq: Two times a day (BID) | ORAL | 3 refills | Status: DC
Start: 2022-09-12 — End: 2023-01-06

## 2022-09-12 NOTE — Addendum Note (Signed)
Addended by: Jannifer Rodney A on: 09/12/2022 03:05 PM   Modules accepted: Orders

## 2022-09-15 LAB — CUP PACEART REMOTE DEVICE CHECK
Date Time Interrogation Session: 20240817230943
Implantable Pulse Generator Implant Date: 20201228

## 2022-09-23 NOTE — Progress Notes (Signed)
Carelink Summary Report / Loop Recorder 

## 2022-10-10 ENCOUNTER — Other Ambulatory Visit: Payer: Self-pay

## 2022-10-10 DIAGNOSIS — K7030 Alcoholic cirrhosis of liver without ascites: Secondary | ICD-10-CM

## 2022-10-13 ENCOUNTER — Ambulatory Visit (INDEPENDENT_AMBULATORY_CARE_PROVIDER_SITE_OTHER): Payer: MEDICAID

## 2022-10-13 DIAGNOSIS — R002 Palpitations: Secondary | ICD-10-CM

## 2022-10-13 NOTE — Telephone Encounter (Signed)
Our address is [address] in [place] & our phone number is [phone]

## 2022-10-17 LAB — CUP PACEART REMOTE DEVICE CHECK
Date Time Interrogation Session: 20240920025158
Implantable Pulse Generator Implant Date: 20201228

## 2022-10-22 ENCOUNTER — Other Ambulatory Visit: Payer: MEDICAID

## 2022-10-22 DIAGNOSIS — K7030 Alcoholic cirrhosis of liver without ascites: Secondary | ICD-10-CM

## 2022-10-23 ENCOUNTER — Telehealth: Payer: Self-pay

## 2022-10-23 ENCOUNTER — Other Ambulatory Visit: Payer: Self-pay | Admitting: Neurology

## 2022-10-23 NOTE — Telephone Encounter (Signed)
Alert received from [employer]:  Alert remote transmission: The device reached RRT on 10/22/2022, sent to triage.

## 2022-10-23 NOTE — Telephone Encounter (Signed)
Notified patient that ILR at end of life.  He does not wish to have it removed at this point.  If he changes his mind down the road he will let us know.  Monthly remotes cancelled.  Paceart/Carelink updated.

## 2022-10-23 NOTE — Telephone Encounter (Signed)
Dispensed Days Supply Quantity Provider Pharmacy  alprazolam 1 mg tablet 10/03/2022 30 30 each Windell Norfolk, MD [facility] [place], ...  alprazolam 1 mg tablet 09/02/2022 30 30 each Windell Norfolk, MD [facility] [place], ...  alprazolam 1 mg tablet 08/01/2022 30 30 each Windell Norfolk, MD [facility] [place], ...  alprazolam 1 mg tablet 07/03/2022 30 30 each Windell Norfolk, MD [facility] [place], ...  alprazolam 1 mg tablet 06/05/2022 30 30 each Sater, Pearletha Furl, MD [facility] [place], ...  alprazolam 1 mg tablet 03/11/2022 30 30 each Sater, Pearletha Furl, MD [facility] [place], ...  alprazolam 1 mg tablet 01/15/2022 30 30 each Sater, Pearletha Furl, MD [facility] [place], ...  alprazolam 1 mg tablet 12/11/2021 30 30 each Sater, Pearletha Furl, MD [facility] [place], ...  alprazolam 1 mg tablet 11/21/2021 20 40 each Jorge Mandril, NP [facility] - [place], ..    Last appt 06/10/22 Next appt 06/11/22

## 2022-10-24 LAB — AFP TUMOR MARKER: AFP-Tumor Marker: 3.2 ng/mL (ref <6.1)

## 2022-10-27 NOTE — Progress Notes (Signed)
Carelink Summary Report / Loop Recorder 

## 2022-11-19 ENCOUNTER — Other Ambulatory Visit: Payer: Self-pay | Admitting: Neurology

## 2022-11-19 DIAGNOSIS — E538 Deficiency of other specified B group vitamins: Secondary | ICD-10-CM

## 2022-11-19 DIAGNOSIS — G629 Polyneuropathy, unspecified: Secondary | ICD-10-CM

## 2022-11-19 DIAGNOSIS — R259 Unspecified abnormal involuntary movements: Secondary | ICD-10-CM

## 2022-11-19 DIAGNOSIS — F1020 Alcohol dependence, uncomplicated: Secondary | ICD-10-CM

## 2022-11-19 NOTE — Telephone Encounter (Signed)
Last seen on 06/10/22 Follow up scheduled on 06/11/23 Last filled on 10/30/22 #90 tablets (30 day supply) Rx pending to be signed   Note attached to Rx " To pharmacy: This prescription was filled on 10/30/2022. Any refills authorized will be placed on file. "

## 2022-12-11 ENCOUNTER — Telehealth: Payer: Self-pay | Admitting: Neurology

## 2022-12-11 NOTE — Telephone Encounter (Signed)
Pt LVM requesting a call back, but did not leave reason for the call.

## 2022-12-12 NOTE — Telephone Encounter (Signed)
Pt is returning the RN's call and is requesting a call back on Monday,

## 2022-12-15 ENCOUNTER — Telehealth: Payer: Self-pay

## 2022-12-15 MED ORDER — ALPRAZOLAM 1 MG PO TABS: ORAL_TABLET | ORAL | 0 refills | Status: DC

## 2022-12-15 NOTE — Telephone Encounter (Signed)
Dr. Frances Furbish- you are WID this am. Are you ok with refilling?

## 2022-12-15 NOTE — Telephone Encounter (Addendum)
Called pt back. Pt is trying to get prescription refill xanax. Last seen 06/10/22. Next f/u 06/11/23. Has been out of xanax for a couple weeks. States pharmacy has sent several faxes to [place] requesting refill with no response. Advised we have not received and apologized. Aware Dr. Epimenio Foot out today and will send request to [facility]. Last refilled 10/03/22 #30.  Also reports twitch in face has worsened. Wanting to know how bad sx can get/expectations moving forward. Prefers to schedule sooner appt with Dr. Epimenio Foot to discuss. Scheduled appt 12/29/22 at 3:00pm.   He is following with Neurosurgery for CTS. Has seen Dr. Maurice Small but he will be leaving practice and he is seeing another provider at some point. Dr. Maurice Small was not comfortable doing CTS surgery. Did injection instead but ineffective.

## 2022-12-15 NOTE — Telephone Encounter (Signed)
-----   Message from Nurse Wilson Singer sent at 06/13/2022  4:36 PM EDT ----- Patient needs 6 month follow up Liver MRI. Refer to imaging result note 06/12/22.

## 2022-12-15 NOTE — Telephone Encounter (Signed)
I am not sure if the Xanax is a standing medication, I cannot access the note. Please have Dr. Epimenio Foot review the Rx.  I will Rx for 10 days.

## 2022-12-15 NOTE — Telephone Encounter (Signed)
Unable to reach pt. Unable to leave voicemail.  

## 2022-12-16 ENCOUNTER — Other Ambulatory Visit: Payer: Self-pay | Admitting: Neurology

## 2022-12-16 MED ORDER — ALPRAZOLAM 1 MG PO TABS: ORAL_TABLET | ORAL | 5 refills | Status: DC

## 2022-12-16 NOTE — Telephone Encounter (Signed)
Unable to reach pt. Unable to leave voicemail.  

## 2022-12-17 ENCOUNTER — Other Ambulatory Visit: Payer: Self-pay

## 2022-12-17 DIAGNOSIS — K7030 Alcoholic cirrhosis of liver without ascites: Secondary | ICD-10-CM

## 2022-12-17 NOTE — Telephone Encounter (Signed)
Pt was made aware of reminder and recommendations.  Pt was scheduled for the MRI on 12/26/2022 at [facility] at 1:00 PM arrive at 12:30 PM to check in. Nothing to eat or drink 4 hours prior. Pt made aware.  Pt verbalized understanding with all questions answered.

## 2022-12-26 ENCOUNTER — Ambulatory Visit (HOSPITAL_COMMUNITY)
Admission: RE | Admit: 2022-12-26 | Discharge: 2022-12-26 | Disposition: A | Discharge status: Home / Self Care | Payer: MEDICAID | Source: Ambulatory Visit | Attending: Nurse Practitioner | Admitting: Nurse Practitioner

## 2022-12-26 DIAGNOSIS — K7030 Alcoholic cirrhosis of liver without ascites: Secondary | ICD-10-CM | POA: Diagnosis present

## 2022-12-26 MED ORDER — GADOBUTROL 1 MMOL/ML IV SOLN
10.0000 mL | Freq: Once | INTRAVENOUS | Status: AC | PRN
  Administered 2022-12-26: 10 mL via INTRAVENOUS

## 2022-12-29 ENCOUNTER — Encounter: Payer: Self-pay | Admitting: Neurology

## 2022-12-29 ENCOUNTER — Telehealth: Payer: Self-pay | Admitting: Neurology

## 2022-12-29 ENCOUNTER — Ambulatory Visit (INDEPENDENT_AMBULATORY_CARE_PROVIDER_SITE_OTHER): Payer: MEDICAID | Admitting: Neurology

## 2022-12-29 VITALS — BP 140/74 | HR 48 | Wt 222.5 lb

## 2022-12-29 DIAGNOSIS — G5132 Clonic hemifacial spasm, left: Secondary | ICD-10-CM

## 2022-12-29 DIAGNOSIS — G5603 Carpal tunnel syndrome, bilateral upper limbs: Secondary | ICD-10-CM

## 2022-12-29 DIAGNOSIS — E538 Deficiency of other specified B group vitamins: Secondary | ICD-10-CM

## 2022-12-29 DIAGNOSIS — G5602 Carpal tunnel syndrome, left upper limb: Secondary | ICD-10-CM

## 2022-12-29 DIAGNOSIS — K7031 Alcoholic cirrhosis of liver with ascites: Secondary | ICD-10-CM

## 2022-12-29 MED ORDER — ALPRAZOLAM 1 MG PO TABS: ORAL_TABLET | ORAL | 5 refills | Status: DC

## 2022-12-29 MED ORDER — CYANOCOBALAMIN 1000 MCG/ML IJ SOLN: 1000.0000 ug | INTRAMUSCULAR | 11 refills | Status: AC

## 2022-12-29 NOTE — Telephone Encounter (Signed)
Received Botox new start form from Dr. Epimenio Foot. I completed [facility]'s PA form and placed in nurse pod for MD signature.  100 units [id number] G51.3, G51.8

## 2022-12-29 NOTE — Telephone Encounter (Signed)
Faxed signed PA form to [facility] @ [phone].

## 2022-12-29 NOTE — Progress Notes (Signed)
[facility]  PATIENT: Earl [name] DOB: 1960-05-30  REFERRING DOCTOR OR PCP:  Dr. Oswaldo Done SOURCE: Patient, notes from primary care, imaging reports, MRI images personally reviewed on PACS  _________________________________   HISTORICAL  CHIEF COMPLAINT:  Chief Complaint  Patient presents with   Room 10    Pt is here Alone. Pt states that the place where he was referred to has not helped him with his Carpal tunnel and they said that they wouldn't do any surgery with him having cirrhosis of the liver. Pt states that he wants to know what is going to happen to him due to him having cirrhosis of the liver. Pt would like to know what is to be expected since his diagnosis.      HISTORY OF PRESENT ILLNESS:  Earl [name] is a 62 y.o. man with numbness in his legs and left facial twitching.      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  He is not a good surgical candidate as he has multiple comorbidities including cirrhosis.   He has not tried Botox therapy. Carpal tunnel syndrome: He has numbness and weakness in the left hand.   NCV showed CTS.  He saw Ortho but due to cirrhosis, he may be referred for an endoscopic CTR. he also has symptoms consistent with mild ulnar neuropathies bilaterally. Neuropathy/history of pernicious anemia: He had pernicious anemia /  severe B12 deficiency around 2012.    He was started on B12 shots and the numbness improved but did not resolve.   Since that time his balance has been poor.  He was diagnosed with a small fiber polyneuropathy after a biopsy by Dr. Gerilyn Pilgrim.     He continues to note numbness and pain in his legs and takes pregabalin 200 mg po tid. the pregabalin has helped to reduce the pain.  He stopped the B12 shots we discussed getting back  on. He also has restless syndrome and is fidgety in general in his legs.   Sometimes leg jerking wakes him up.   He has some anxiety and occasioanlly takes alprazolam.    Other:  He has a history of alcohol abuse and has alcoholic cirrhosis followed by GI.   He has atrial flutter, treated with ablation with benefit.    He has ben found to have mild hypothyroid and used to be on synthroid.   He has a history of alcohol abuse (2 bottles of wine a day) but stopped a couple years ago and has not resumed.  IMAGING: MRI Brain 06/23/2012.  Shows some scattered T2/FLAIR hyperintense foci predominantly in the deep white matter.  There was one focus in the cerebellum also.  This is a nonspecific finding and most likely represents mild chronic microvascular ischemic change.  The lesions do not have a demyelinating appearance.  There were no acute findings.  MRI Brain 05/27/2017 showed a few scattered small T2/FLAIR hyperintense foci in the cerebral hemispheres consistent with minimal age-related chronic microvascular ischemic change.  No definite change compared to the 06/23/2012 MRI.  Normal enhancement pattern.  The left vertebral artery is dominant and tortuous.  MRI cervical spine 05/27/2017 shows a normal spinal cord.  There are mild degenerative changes at C3-C4 through C6-C7 but no spinal stenosis or nerve root compression.  MRi brain/iAC 02/24/2022 showed: FINDINGS: Brain:  Negative for retrocochlear mass or inflammatory type enhancement. The left vertebral artery is dominant and torturous, traversing the left CP angle cistern. The left AICA passes in close proximity to the cisternal left vestibulocochlear nerve complex with branch that extends medial and then inferior with contact of the left facial nerve root entry zone. Normal appearance of the brainstem.   Few remote white matter insults with nonspecific pattern. No acute or subacute infarct, hemorrhage, hydrocephalus, or collection.   Vascular:  Normal flow voids.   Skull and upper cervical spine: Normal marrow signal.   Sinuses/Orbits: Negative.   IMPRESSION: Negative for mass or neuritic enhancement. Arterial anatomy at the left facial nerve root entry zone described above.  NCV 07/21/2022:    There is mild left-sided median neuropathy across the wrist. Patient declined EMG/Needle study. Patient declined testing of his right arm   REVIEW OF SYSTEMS: Constitutional: No fevers, chills, sweats, or change in appetite.     Eyes: No visual changes, double vision, eye pain Ear, nose and throat: No hearing loss, ear pain, nasal congestion, sore throat Cardiovascular: No chest pain, palpitations Respiratory:  No shortness of breath at rest or with exertion.   No wheezes GastrointestinaI: No nausea, vomiting, diarrhea, abdominal pain, fecal incontinence Genitourinary:  No dysuria, urinary retention or frequency.  No nocturia. Musculoskeletal:  No neck pain, back pain Integumentary: No rash, pruritus, skin lesions Neurological: as above Psychiatric: No depression at this time.  No anxiety Endocrine: No palpitations, diaphoresis, change in appetite, change in weigh or increased thirst Hematologic/Lymphatic:  No anemia, purpura, petechiae. Allergic/Immunologic: No itchy/runny eyes, nasal congestion, recent allergic reactions, rashes  ALLERGIES: Allergies  Allergen Reactions   Amitriptyline Anaphylaxis   Amoxicillin Anaphylaxis and Rash    Has patient had a PCN reaction causing immediate rash, facial/tongue/throat swelling, SOB or lightheadedness with hypotension: Yes Has patient had a PCN reaction causing severe rash involving mucus membranes or skin necrosis: No Has patient had a PCN reaction that required hospitalization: No Has patient had a PCN reaction occurring within the last 10 years: Yes If all of the above answers are "NO", then may proceed with Cephalosporin use.    Asa [Aspirin] Anaphylaxis   Penicillin G Anaphylaxis    Almecillin    Clonidine Derivatives Other (See Comments)    Severe Dry Mouth   Hydrochlorothiazide     Caused rectal bleeding and hematuria   Levothyroxine Nausea And Vomiting    Patient states that the 75 mcg is too strong for him.   Trintellix [Vortioxetine] Nausea And Vomiting    HOME MEDICATIONS:  Current Outpatient Medications:    ALPRAZolam (XANAX) 1 MG tablet, One po every day prn, Disp: 30 tablet, Rfl: 5   atenolol (TENORMIN) 50 MG tablet, TAKE 1 TABLET BY MOUTH DAILY (Patient taking differently: Take 25 mg by mouth daily.), Disp: 90 tablet, Rfl: 1   diltiazem (CARDIZEM CD) 300 MG 24 hr capsule, TAKE ONE CAPSULE BY MOUTH DAILY, Disp: 30 capsule, Rfl: 5   furosemide (LASIX) 40 MG tablet, Take 1 tablet (40 mg total) by mouth 2 (two) times daily., Disp: 60 tablet, Rfl: 3   Lactulose 20 GM/30ML SOLN, Take 300 mLs (200 g total) by mouth at bedtime as needed (constipation)., Disp: 300 mL, Rfl: 0   levETIRAcetam (KEPPRA) 750 MG tablet, Take 1 tablet (750 mg total) by mouth 2 (two) times daily., Disp: 60 tablet, Rfl: 5   melatonin 3 MG TABS tablet, Take 3 mg by mouth at bedtime as needed (sleep).,  Disp: , Rfl:    ondansetron (ZOFRAN) 4 MG tablet, TAKE 1 TABLET BY MOUTH TWICE DAILY AS NEEDED FOR NAUSEA OR FOR VOMITING, Disp: 30 tablet, Rfl: 1   Oxcarbazepine (TRILEPTAL) 300 MG tablet, Take 1 tablet (300 mg total) by mouth 2 (two) times daily. (Patient taking differently: Take 300 mg by mouth daily.), Disp: 60 tablet, Rfl: 5   pantoprazole (PROTONIX) 40 MG tablet, Take 1 tablet (40 mg total) by mouth daily before breakfast., Disp: 90 tablet, Rfl: 0   pregabalin (LYRICA) 200 MG capsule, TAKE THREE CAPSULES BY MOUTH DAILY, Disp: 90 capsule, Rfl: 2   ALPRAZolam (XANAX) 1 MG tablet, TAKE 1 TABLET BY MOUTH DAILY AS NEEDED FOR ANXIETY. Must last 30 DAYS. (Patient not taking: Reported on 12/29/2022), Disp: 10 tablet, Rfl: 0   cyanocobalamin (VITAMIN B12) 1000 MCG/ML injection, Inject 1 mL (1,000  mcg total) into the muscle every 30 (thirty) days., Disp: 1 mL, Rfl: 11  PAST MEDICAL HISTORY: Past Medical History:  Diagnosis Date   Alcohol abuse    Anemia    Anxiety    Arthritis    Colon cancer (HCC)    Depression    Dysrhythmia    Gall stones    Heart burn    History of kidney stones    Hyperlipidemia    Hypertension    Hypothyroidism    Liver disease    Pernicious anemia    Tachycardia    Typical atrial flutter (HCC)    Vitamin B12 deficiency     PAST SURGICAL HISTORY: Past Surgical History:  Procedure Laterality Date   A-FLUTTER ABLATION N/A 07/31/2016   Procedure: A-Flutter Ablation;  Surgeon: Hillis Range, MD;  Location: [facility];  Service: Cardiovascular;  Laterality: N/A;   BIOPSY  05/08/2022   Procedure: BIOPSY;  Surgeon: Lynann Bologna, MD;  Location: [facility];  Service: Gastroenterology;;   COLONOSCOPY N/A 04/17/2016   Procedure: COLONOSCOPY;  Surgeon: Malissa Hippo, MD;  Location: [facility];  Service: Endoscopy;  Laterality: N/A;  1200   COLONOSCOPY N/A 05/07/2017   Procedure: COLONOSCOPY;  Surgeon: Malissa Hippo, MD;  Location: [facility];  Service: Endoscopy;  Laterality: N/A;  200   COLONOSCOPY WITH PROPOFOL N/A 07/25/2020   Procedure: COLONOSCOPY WITH PROPOFOL;  Surgeon: Malissa Hippo, MD;  [facility];  Service: Endoscopy;  Laterality: N/A;  12:10   ESOPHAGEAL BANDING  07/25/2020   Procedure: ESOPHAGEAL BANDING;  Surgeon: Malissa Hippo, MD;  [facility];  Service: Endoscopy;;   ESOPHAGOGASTRODUODENOSCOPY (EGD) WITH PROPOFOL N/A 07/25/2020   Procedure: ESOPHAGOGASTRODUODENOSCOPY (EGD) WITH PROPOFOL;  Surgeon: Malissa Hippo, MD;  [facility];  Service: Endoscopy;  Laterality: N/A;   ESOPHAGOGASTRODUODENOSCOPY (EGD) WITH PROPOFOL N/A 04/11/2021   Procedure: ESOPHAGOGASTRODUODENOSCOPY (EGD) WITH PROPOFOL;  Surgeon: Malissa Hippo, MD;  [facility];  Service: Endoscopy;  Laterality:  N/A;  1:00   ESOPHAGOGASTRODUODENOSCOPY (EGD) WITH PROPOFOL N/A 05/08/2022   Procedure: ESOPHAGOGASTRODUODENOSCOPY (EGD) WITH PROPOFOL;  Surgeon: Lynann Bologna, MD;  Location: [facility];  Service: Gastroenterology;  Laterality: N/A;   implantable loop recorder placement  01/24/2019    Medtronic Reveal Linq model LNQ 11 implantable loop recorder (SN [id number] ) by Dr Johney Frame for evaluation of palpitations   MINOR CARPAL TUNNEL     right   POLYPECTOMY  04/17/2016   Procedure: POLYPECTOMY;  Surgeon: Malissa Hippo, MD;  [facility];  Service: Endoscopy;;  sigmoid   POLYPECTOMY  07/25/2020   Procedure: POLYPECTOMY;  Surgeon: Malissa Hippo, MD;  [facility];  Service: Endoscopy;;    FAMILY HISTORY: Family History  Problem Relation Age of Onset   Alzheimer's disease Mother    Hyperlipidemia Mother    Gallbladder disease Mother    Heart disease Father    Coronary artery disease Father    Liver disease Father    Thyroid disease Father    Hyperlipidemia Father    Heart attack Father    Cirrhosis Father    Gallbladder disease Sister    Coronary artery disease Paternal Grandfather     SOCIAL HISTORY:  Social History   Socioeconomic History   Marital status: Single    Spouse name: Not on file   Number of children: 0   Years of education: Not on file   Highest education level: Not on file  Occupational History   Occupation: retired/disabled  Tobacco Use   Smoking status: Former    Current packs/day: 0.00    Types: Cigarettes    Quit date: 03/03/1979    Years since quitting: 43.8   Smokeless tobacco: Never  Vaping Use   Vaping status: Never Used  Substance and Sexual Activity   Alcohol use: Not Currently    Alcohol/week: 8.0 standard drinks of alcohol    Types: 8 Cans of beer per week    Comment: every other day   Drug use: No   Sexual activity: Not on file  Other Topics Concern   Not on file  Social History Narrative   Disabled   Lives in  [place]   Social Determinants of Health   Financial Resource Strain: Not on file  Food Insecurity: Not on file  Transportation Needs: Not on file  Physical Activity: Not on file  Stress: Not on file  Social Connections: Not on file  Intimate Partner Violence: Not on file     PHYSICAL EXAM  Vitals:   12/29/22 1459  BP: (!) 140/74  Pulse: (!) 48  Weight: 222 lb 8 oz (100.9 kg)     Body mass index is 31.93 kg/m.   General: The patient is well-developed and well-nourished and in no acute distress  Eyes:  Funduscopic exam shows normal optic discs and retinal vessels.  Neck: The neck is supple, no carotid bruits are noted.  The neck is nontender.  Cardiovascular: The heart has a regular rate and rhythm with a normal S1 and S2. There were no murmurs, gallops or rubs.    Skin: Extremities are without significant edema.  Musculoskeletal:  Back is nontender  Neurologic Exam  Mental status: The patient is alert and oriented x 3 at the time of the examination. The patient has apparent normal recent and remote memory, with an apparently normal attention span and concentration ability.   Speech is normal.  Cranial nerves: Extraocular movements are full. Pupils are equal, round, and reactive to light and accomodation.  Visual fields are full.  Minimal left ptosis.  Of note, there is evidence of cross reinnervation with eye closure upon smiling and the left corner of the mouth going up when he closes his eyes.  Frequent left hemifacial spasms are noted.  He has good facial sensation to soft touch bilaterally  No obvious hearing deficits are noted.  Motor:  Muscle bulk is normal.   Tone is normal. Strength is  5 / 5 in all 4 extremities.   Sensory: Sensory testing shows reduced sensation to pinprick in 4th/5th fingers.  He has a Tinel's sign at left wrist     Phalens sign to left  Coordination: Cerebellar testing reveals good finger-nose-finger and heel-to-shin bilaterally.  Gait and  station: Station is normal.   Gait is normal. Tandem gait is slightly wide. Romberg is negative.   Reflexes: Deep tendon reflexes are symmetric and normal bilaterally.   Plantar responses are flexor.    DIAGNOSTIC DATA (LABS, IMAGING, TESTING) - I reviewed patient records, labs, notes, testing and imaging myself where available.  Lab Results  Component Value Date   WBC 5.3 09/05/2022   HGB 14.5 09/05/2022   HCT 42.2 09/05/2022   MCV 90 09/05/2022   PLT 107 (L) 09/05/2022      Component Value Date/Time   NA 141 09/05/2022 1217   K 4.1 09/05/2022 1217   CL 100 09/05/2022 1217   CO2 25 09/05/2022 1217   GLUCOSE 91 09/05/2022 1217   GLUCOSE 102 (H) 04/11/2022 1110   BUN 35 (H) 09/05/2022 1217   CREATININE 1.18 09/05/2022 1217   CREATININE 0.83 07/18/2020 1304   CALCIUM 9.7 09/05/2022 1217   PROT 7.0 09/05/2022 1217   ALBUMIN 4.2 09/05/2022 1217   AST 21 09/05/2022 1217   ALT 21 09/05/2022 1217   ALKPHOS 109 09/05/2022 1217   BILITOT 0.7 09/05/2022 1217   GFRNONAA >60 11/25/2021 2112   GFRNONAA 71 09/06/2019 1313   GFRAA 82 09/06/2019 1313   Lab Results  Component Value Date   CHOL 129 09/05/2022   HDL 37 (L) 09/05/2022   LDLCALC 76 09/05/2022   TRIG 79 09/05/2022   CHOLHDL 3.5 09/05/2022   Lab Results  Component Value Date   HGBA1C 4.5 (L) 07/26/2019   Lab Results  Component Value Date   VITAMINB12 410 09/05/2022   Lab Results  Component Value Date   TSH 2.490 09/05/2022       ASSESSMENT AND PLAN  Carpal tunnel syndrome of left wrist  Bilateral carpal tunnel syndrome  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  Likely not candidate for Neurosurgery for decompression of left 7th nerve (AICA may be  compressing) due to cirrhosis Restart B12 shots  (he got the injections with  the pharmacist). Refill prn alprazolam for  anxiety Rtc 6 months or sooner if new or worsening issues.    Trygg Mantz A. Epimenio Foot, MD, [facility] 12/29/2022, 3:26 PM Certified in Neurology, Clinical Neurophysiology, Sleep Medicine, Pain Medicine and Neuroimaging  [facility] [address], [address] [place], [place] [address] [phone]

## 2022-12-30 ENCOUNTER — Other Ambulatory Visit: Payer: Self-pay | Admitting: Neurology

## 2022-12-30 ENCOUNTER — Telehealth: Payer: Self-pay | Admitting: Neurology

## 2022-12-30 ENCOUNTER — Encounter: Payer: Self-pay | Admitting: Neurology

## 2022-12-30 MED ORDER — LEVETIRACETAM 750 MG PO TABS: 750.0000 mg | ORAL_TABLET | Freq: Two times a day (BID) | ORAL | 5 refills | Status: DC

## 2022-12-30 NOTE — Telephone Encounter (Signed)
See my chart message

## 2022-12-30 NOTE — Telephone Encounter (Signed)
Pt called wanting to complain that he was allowed to leave without getting his injection. He states that he should not have to remember these things on his own. He has requested that he gets a call back to discuss this and other things that were "not done" at yesterday's visit. Please advise.

## 2022-12-31 ENCOUNTER — Other Ambulatory Visit: Payer: Self-pay

## 2022-12-31 MED ORDER — ONABOTULINUMTOXINA 100 UNITS IJ SOLR
100.0000 [IU] | Freq: Once | INTRAMUSCULAR | 0 refills | Status: AC
  Filled 2022-12-31: qty 1, 1d supply, fill #0

## 2022-12-31 NOTE — Telephone Encounter (Signed)
E-scribed rx to [facility].  Jillian- I talked to Dr. Epimenio Foot. He said you can fit pt in on a Monday afternoon for botox.

## 2022-12-31 NOTE — Addendum Note (Signed)
Addended by: Arther Abbott on: 12/31/2022 11:27 AM   Modules accepted: Orders

## 2022-12-31 NOTE — Telephone Encounter (Signed)
Received approval from [facility] for Botox. Please send Rx to [facility], thank you!  Auth#: [id number] (12/30/22-12/30/23)  Dr. Epimenio Foot, you are booking quite a few months out. Can he be put in a MSNP or work in spot in January for new start injection? Or I can book out to next OV spot, just let me know!

## 2023-01-01 ENCOUNTER — Other Ambulatory Visit (HOSPITAL_COMMUNITY): Payer: Self-pay

## 2023-01-05 ENCOUNTER — Telehealth: Payer: Self-pay | Admitting: Family Medicine

## 2023-01-05 NOTE — Telephone Encounter (Signed)
Copied from CRM [id number]. Topic: Clinical - Medical Advice >> Jan 05, 2023  4:19 PM Chantha C wrote: Reason for CRM: Pt c/o possible skin infection, redness, itchiness so bad on R ear for about 2 weeks, tried a couple of antifungal, lidocaine,and hydrocortisone seems to help for a little bit and then back to worse. Pls advise and c/b [phone] Pt states it's hard to make into the office, maybe a telephone visit? Pt's pharmacy is  [facility]. - [place], [place] - [address] [place] [address] HQION:[phone]:[phone]

## 2023-01-06 ENCOUNTER — Other Ambulatory Visit: Payer: Self-pay | Admitting: Family

## 2023-01-06 DIAGNOSIS — K7030 Alcoholic cirrhosis of liver without ascites: Secondary | ICD-10-CM

## 2023-01-06 NOTE — Telephone Encounter (Signed)
To my knowledge we are not allowed to do telephone calls anymore. Does he have a friend or neighbor that could use his smart phone for 15 mins?  If not he may need to go to Urgent Care.   Jannifer Rodney, FNP

## 2023-01-06 NOTE — Telephone Encounter (Signed)
Spoke with pt regarding this. Pt is unable to come to the office due to no transportation and doesn't have a cell phone to do a video call. Pt says he is willing to pay out of pocket for a telephone visit if his insurance wont pay for it, as long as its not too expensive. Pt says he really needs an antibiotic before infection spreads worse. Please advise.   Copied from CRM [id number]. Topic: General - Other >> Jan 06, 2023 12:02 PM Colletta Maryland S wrote: Reason for CRM: Pt is following up about callback he was supposed to receive, callback number is [phone]

## 2023-01-07 NOTE — Telephone Encounter (Signed)
Called patient to let him know the price if insurance denied it was If it's a level 3 it would be $175 and level 4 would be $260. Patient aware he will call back to schedule if it gets worse.

## 2023-01-07 NOTE — Telephone Encounter (Signed)
Spoke with Earl [name] they can file insurance and if they dont pay patient will have to pay in full no self pay discount

## 2023-01-12 ENCOUNTER — Other Ambulatory Visit (HOSPITAL_COMMUNITY): Payer: Self-pay

## 2023-01-13 ENCOUNTER — Other Ambulatory Visit: Payer: Self-pay

## 2023-01-13 ENCOUNTER — Other Ambulatory Visit (HOSPITAL_COMMUNITY): Payer: Self-pay

## 2023-01-13 NOTE — Progress Notes (Signed)
Spoke with PT and he chose not to participate in Botox-discussing other treatment options with provider. Disenrolled.

## 2023-01-26 NOTE — Telephone Encounter (Signed)
I called and was able to speak with the patient. He did confirm that he wants to hold off on Botox for now, cancelled 02/09/23 appt and told him to let us know if he decides to try the Botox.

## 2023-01-30 NOTE — Telephone Encounter (Signed)
 Dr. Chales Abrahams, I am forwarding you this message as you are patient's [facility]. Patient was previously followed by gastroenterologist Dr. Karilyn Cota in [place] prior to  transitioning his GI management with [facility].

## 2023-02-09 ENCOUNTER — Ambulatory Visit: Payer: MEDICAID | Admitting: Neurology

## 2023-02-16 NOTE — Telephone Encounter (Signed)
Dr. Chales Abrahams, see patient's note below. Do you recommend a hepatology referral to [facility] or [facility] to further address his liver concerns?

## 2023-02-19 ENCOUNTER — Other Ambulatory Visit: Payer: Self-pay | Admitting: Neurology

## 2023-02-19 DIAGNOSIS — G629 Polyneuropathy, unspecified: Secondary | ICD-10-CM

## 2023-02-19 DIAGNOSIS — R259 Unspecified abnormal involuntary movements: Secondary | ICD-10-CM

## 2023-02-19 DIAGNOSIS — E538 Deficiency of other specified B group vitamins: Secondary | ICD-10-CM

## 2023-02-19 DIAGNOSIS — F1020 Alcohol dependence, uncomplicated: Secondary | ICD-10-CM

## 2023-02-19 NOTE — Telephone Encounter (Signed)
Last seen 12/29/22 and next f/u 06/11/23. Last refilled 01/30/23 #90.

## 2023-02-24 NOTE — Telephone Encounter (Signed)
Liver clinic at [facility] with [facility] - Routine RG

## 2023-02-26 ENCOUNTER — Other Ambulatory Visit: Payer: Self-pay

## 2023-02-26 DIAGNOSIS — K7030 Alcoholic cirrhosis of liver without ascites: Secondary | ICD-10-CM

## 2023-03-09 ENCOUNTER — Ambulatory Visit: Payer: MEDICAID | Admitting: Family

## 2023-03-30 ENCOUNTER — Ambulatory Visit: Payer: MEDICAID | Admitting: Family

## 2023-03-30 ENCOUNTER — Other Ambulatory Visit: Payer: Self-pay | Admitting: Family

## 2023-04-01 ENCOUNTER — Telehealth: Payer: Self-pay | Admitting: Gastroenterology

## 2023-04-01 NOTE — Telephone Encounter (Signed)
 Inbound call from patient stating that he is coming next Tuesday to get his labs drawn. Patient is requesting a call back to discuss if he can get a B-12 shot while he is here. Patient states he is overdue. Please advise.

## 2023-04-02 NOTE — Telephone Encounter (Signed)
 Advised patient that he would need to go through PCP for B12 injections they are the current prescribers & have the orders. Pt verbalized all understanding.

## 2023-04-07 ENCOUNTER — Other Ambulatory Visit: Payer: MEDICAID

## 2023-04-07 DIAGNOSIS — K7030 Alcoholic cirrhosis of liver without ascites: Secondary | ICD-10-CM

## 2023-04-08 LAB — AFP TUMOR MARKER: AFP-Tumor Marker: 2.5 ng/mL (ref <6.1)

## 2023-04-20 ENCOUNTER — Telehealth: Payer: Self-pay | Admitting: Gastroenterology

## 2023-04-20 ENCOUNTER — Other Ambulatory Visit: Payer: Self-pay | Admitting: Family

## 2023-04-20 DIAGNOSIS — K7030 Alcoholic cirrhosis of liver without ascites: Secondary | ICD-10-CM

## 2023-04-20 NOTE — Telephone Encounter (Signed)
 Patient called and stated that he unfortunately can not do April the 11 th with Dr. Chales Abrahams at 8:50 am. Patient stated that he has another appointment that day with the transplant center. Patient would like to speak to the nurse regarding rescheduling. I spoke to [facility] and appointment was changed to April 14 at 8:50 am

## 2023-04-20 NOTE — Telephone Encounter (Signed)
 Linda/Dottie, pls contact patient and let him know that I reviewed his messages and have forwarded to Dr. Chales Abrahams to review. He is due for an office visit. Pls schedule him for a follow up appointment with Dr. Chales Abrahams or APP in POD [facility]. THX.

## 2023-04-28 ENCOUNTER — Encounter: Payer: Self-pay | Admitting: Family

## 2023-04-28 ENCOUNTER — Ambulatory Visit (INDEPENDENT_AMBULATORY_CARE_PROVIDER_SITE_OTHER): Payer: MEDICAID | Admitting: Family

## 2023-04-28 VITALS — BP 131/77 | HR 69 | Temp 97.0°F | Ht 70.0 in | Wt 195.0 lb

## 2023-04-28 DIAGNOSIS — E559 Vitamin D deficiency, unspecified: Secondary | ICD-10-CM

## 2023-04-28 DIAGNOSIS — K219 Gastro-esophageal reflux disease without esophagitis: Secondary | ICD-10-CM

## 2023-04-28 DIAGNOSIS — B356 Tinea cruris: Secondary | ICD-10-CM

## 2023-04-28 DIAGNOSIS — I10 Essential (primary) hypertension: Secondary | ICD-10-CM | POA: Diagnosis not present

## 2023-04-28 DIAGNOSIS — F339 Major depressive disorder, recurrent, unspecified: Secondary | ICD-10-CM | POA: Diagnosis not present

## 2023-04-28 DIAGNOSIS — H6122 Impacted cerumen, left ear: Secondary | ICD-10-CM | POA: Diagnosis not present

## 2023-04-28 DIAGNOSIS — F1011 Alcohol abuse, in remission: Secondary | ICD-10-CM

## 2023-04-28 DIAGNOSIS — E538 Deficiency of other specified B group vitamins: Secondary | ICD-10-CM | POA: Diagnosis not present

## 2023-04-28 DIAGNOSIS — K7030 Alcoholic cirrhosis of liver without ascites: Secondary | ICD-10-CM

## 2023-04-28 DIAGNOSIS — I480 Paroxysmal atrial fibrillation: Secondary | ICD-10-CM

## 2023-04-28 DIAGNOSIS — E663 Overweight: Secondary | ICD-10-CM

## 2023-04-28 DIAGNOSIS — E782 Mixed hyperlipidemia: Secondary | ICD-10-CM

## 2023-04-28 MED ORDER — PANTOPRAZOLE SODIUM 40 MG PO TBEC: 40.0000 mg | DELAYED_RELEASE_TABLET | Freq: Every day | ORAL | 0 refills | Status: DC

## 2023-04-28 MED ORDER — FUROSEMIDE 20 MG PO TABS: 20.0000 mg | ORAL_TABLET | Freq: Every day | ORAL | 3 refills | Status: DC

## 2023-04-28 MED ORDER — TERBINAFINE HCL 250 MG PO TABS: 250.0000 mg | ORAL_TABLET | Freq: Every day | ORAL | 0 refills | Status: DC

## 2023-04-28 MED ORDER — CYANOCOBALAMIN 1000 MCG/ML IJ SOLN
1000.0000 ug | Freq: Once | INTRAMUSCULAR | Status: AC
  Administered 2023-04-28: 1000 ug via INTRAMUSCULAR

## 2023-04-28 MED ORDER — DILTIAZEM HCL ER COATED BEADS 300 MG PO CP24
300.0000 mg | ORAL_CAPSULE | Freq: Every day | ORAL | 0 refills | Status: DC
Start: 2023-04-28 — End: 2023-05-03

## 2023-04-28 NOTE — Patient Instructions (Addendum)
 Jock Itch Jock itch (tinea cruris) is an infection of the skin in the groin area that is caused by a fungus. Jock itch causes an itchy rash in the groin and upper thigh area. It usually goes away in 2-3 weeks with treatment. What are the causes? The fungus that causes jock itch may be spread by: Touching a fungal infection elsewhere on your body, such as athlete's foot, and then touching your groin area. Sharing towels or clothing, such as socks or shoes, with someone who has a fungal infection. What increases the risk? Jock itch is most common in men and adolescent boys. You are also more likely to develop the condition if you: Are in a hot, humid climate. Wear tight-fitting clothing or wet bathing suits for long periods of time. Play sports. Are overweight. Have diabetes. Have a weakened body defense system (immune system). Sweat a lot. What are the signs or symptoms? Symptoms of jock itch may include: A red, pink, or brown rash in the groin area. Blisters may be present. The rash may spread to the thighs, the opening between the buttocks (anus), and the buttocks. Dry and scaly skin on or around the rash. Itchiness. How is this diagnosed? In most cases, your health care provider can make the diagnosis by looking at your rash. In some cases, a sample of infected skin may be scraped off. This sample may be examined under a microscope (biopsy) or by trying to grow the fungus from the sample (culture). How is this treated? Treatment for this condition may include: Antifungal medicine to kill the fungus. This may be a skin cream, ointment, or powder, or it may be a medicine that you take by mouth (orally). Skin cream or ointment to reduce itching. Lifestyle changes, such as wearing looser clothing and caring for your skin. Follow these instructions at home: Skin care Apply skin creams, ointments, or powders exactly as told by your health care provider. Wear loose-fitting clothing that does  not rub against your groin area. Men should wear boxer shorts or loose-fitting underwear. Keep your groin area clean and dry. Change your underwear every day. Change out of wet bathing suits as soon as possible. After bathing, use a separate towel to gently dry your groin area thoroughly. Using a separate towel will help prevent spreading the infection to other parts of your body. Avoid hot baths and showers. Hot water can make itching worse. Do not scratch the affected area. General instructions Take and apply over-the-counter and prescription medicines only as told by your health care provider. Do not share towels, clothing, or personal items with other people. Wash your hands often with soap and water for at least 20 seconds, especially after touching your groin area. If soap and water are not available, use hand sanitizer. When at the gym: Always wear shoes, especially in the shower and around the swimming pool. Keep any cuts covered. Disinfect any mats or equipment before using them. Shower immediately after working out. Keep all follow-up visits. This is important. Contact a health care provider if: Your rash: Gets worse or does not get better after 2 weeks of treatment. Spreads. Returns after treatment is finished. You have any of the following: A fever. New or worsening redness, swelling, or pain around your rash. Fluid, blood, or pus coming from your rash. Summary Jock itch (tinea cruris) is a fungal infection of the skin in the groin area. The fungus can be spread by sharing clothing or by touching a fungus infection  elsewhere on your body and then touching your groin area. Treatment may include antifungal medicine and lifestyle changes, such as keeping the area clean and dry. This information is not intended to replace advice given to you by your health care provider. Make sure you discuss any questions you have with your health care provider. Document Revised: 04/03/2020  Document Reviewed: 04/03/2020 Elsevier Patient Education  2024 [employer].

## 2023-04-28 NOTE — Progress Notes (Signed)
 Subjective:    Patient ID: Earl [name], male    DOB: Mar 01, 1960, 63 y.o.   MRN: [id number]  Chief Complaint  Patient presents with   Medical Management of Chronic Issues   Pt presents to the office today for chronic follow up. He is followed by GI for alcoholic cirrhosis and malignant neoplasm of colon.  He is followed by Cardiologists for NSVT and atrial flutter.   He is followed by Neurologists for neuropathy who gives him Lyrica.    He states he stopped drinking alcohol 12/09/21.   He is currently taking lasix 20 mg  as needed when he gains fluid. Has not taken in over a month.  He's lost 85 lbs in the last 6 months through dieting  and working out daily.      04/28/2023    1:53 PM 12/29/2022    2:59 PM 09/05/2022   11:46 AM  Last 3 Weights  Weight (lbs) 195 lb 222 lb 8 oz 236 lb  Weight (kg) 88.451 kg 100.925 kg 107.049 kg      Hypertension This is a chronic problem. The current episode started more than 1 year ago. The problem has been resolved since onset. The problem is controlled. Pertinent negatives include no malaise/fatigue, peripheral edema or shortness of breath. Risk factors for coronary artery disease include dyslipidemia, obesity, male gender and sedentary lifestyle. The current treatment provides moderate improvement. Identifiable causes of hypertension include a thyroid problem.  Thyroid Problem Presents for follow-up visit. Symptoms include dry skin. Patient reports no anxiety, constipation or diarrhea. The symptoms have been stable.  Hyperlipidemia This is a chronic problem. The current episode started more than 1 year ago. The problem is controlled. Recent lipid tests were reviewed and are normal. Exacerbating diseases include obesity. Pertinent negatives include no shortness of breath. Current antihyperlipidemic treatment includes diet change. The current treatment provides moderate improvement of lipids. Risk factors for coronary artery disease include  dyslipidemia, hypertension, a sedentary lifestyle and male sex.  Depression        This is a chronic problem.  The current episode started more than 1 year ago.   The problem occurs intermittently.  Associated symptoms include no helplessness, no hopelessness and not sad.  Past treatments include nothing.  Past medical history includes thyroid problem.   Gastroesophageal Reflux He complains of belching and heartburn. This is a chronic problem. The current episode started more than 1 year ago. The problem occurs occasionally. The symptoms are aggravated by certain foods. He has tried a diet change for the symptoms. The treatment provided mild relief.  Rash This is a new problem. The current episode started more than 1 year ago. The problem has been waxing and waning since onset. The affected locations include the groin. The rash is characterized by redness and itchiness. Pertinent negatives include no diarrhea or shortness of breath. Past treatments include anti-itch cream. The treatment provided mild relief.     Review of Systems  Constitutional:  Negative for malaise/fatigue.  Respiratory:  Negative for shortness of breath.   Gastrointestinal:  Positive for heartburn. Negative for constipation and diarrhea.  Skin:  Positive for rash.  Psychiatric/Behavioral:  The patient is not nervous/anxious.   All other systems reviewed and are negative.  Family History  Problem Relation Age of Onset   Alzheimer's disease Mother    Hyperlipidemia Mother    Gallbladder disease Mother    Heart disease Father    Coronary artery disease Father  Liver disease Father    Thyroid disease Father    Hyperlipidemia Father    Heart attack Father    Cirrhosis Father    Gallbladder disease Sister    Coronary artery disease Paternal Grandfather    Social History   Socioeconomic History   Marital status: Single    Spouse name: Not on file   Number of children: 0   Years of education: Not on file    Highest education level: Not on file  Occupational History   Occupation: retired/disabled  Tobacco Use   Smoking status: Former    Current packs/day: 0.00    Types: Cigarettes    Quit date: 03/03/1979    Years since quitting: 44.1   Smokeless tobacco: Never  Vaping Use   Vaping status: Never Used  Substance and Sexual Activity   Alcohol use: Not Currently    Alcohol/week: 8.0 standard drinks of alcohol    Types: 8 Cans of beer per week    Comment: every other day   Drug use: No   Sexual activity: Not on file  Other Topics Concern   Not on file  Social History Narrative   Disabled   Lives in [place]   Social Drivers of Health   Financial Resource Strain: Medium Risk (04/25/2023)   Overall Financial Resource Strain (CARDIA)    Difficulty of Paying Living Expenses: Somewhat hard  Food Insecurity: Food Insecurity Present (04/25/2023)   Hunger Vital Sign    Worried About Running Out of Food in the Last Year: Sometimes true    Ran Out of Food in the Last Year: Never true  Transportation Needs: No Transportation Needs (04/25/2023)   PRAPARE - Administrator, Civil Service (Medical): No    Lack of Transportation (Non-Medical): No  Physical Activity: Sufficiently Active (04/25/2023)   Exercise Vital Sign    Days of Exercise per Week: 7 days    Minutes of Exercise per Session: 150+ min  Stress: Stress Concern Present (04/25/2023)   [employer] of Occupational Health - Occupational Stress Questionnaire    Feeling of Stress : Very much  Social Connections: Socially Isolated (04/25/2023)   Social Connection and Isolation Panel [NHANES]    Frequency of Communication with Friends and Family: More than three times a week    Frequency of Social Gatherings with Friends and Family: Never    Attends Religious Services: Never    Database administrator or Organizations: No    Attends Engineer, structural: Not on file    Marital Status: Never married       Objective:    Physical Exam Vitals reviewed.  Constitutional:      General: He is not in acute distress.    Appearance: He is well-developed. He is obese.  HENT:     Head: Normocephalic.     Left Ear: Tympanic membrane and external ear normal. There is impacted cerumen.  Eyes:     General:        Right eye: No discharge.        Left eye: No discharge.     Pupils: Pupils are equal, round, and reactive to light.  Neck:     Thyroid: No thyromegaly.  Cardiovascular:     Rate and Rhythm: Normal rate and regular rhythm.     Heart sounds: Normal heart sounds. No murmur heard. Pulmonary:     Effort: Pulmonary effort is normal. No respiratory distress.     Breath sounds: Normal breath  sounds. No wheezing.  Abdominal:     General: Bowel sounds are normal. There is no distension.     Palpations: Abdomen is soft.     Tenderness: There is no abdominal tenderness.  Musculoskeletal:        General: No tenderness. Normal range of motion.     Cervical back: Normal range of motion and neck supple.  Skin:    General: Skin is warm and dry.     Findings: No erythema or rash.  Neurological:     Mental Status: He is alert and oriented to person, place, and time.     Cranial Nerves: No cranial nerve deficit.     Deep Tendon Reflexes: Reflexes are normal and symmetric.     Comments: twitch of left eye  Psychiatric:        Mood and Affect: Affect is angry.        Behavior: Behavior normal.        Thought Content: Thought content normal.        Judgment: Judgment normal.     Left  ears washed with warm water and peroxide. TM WNL  BP 131/77   Pulse 69   Temp (!) 97 F (36.1 C) (Temporal)   Ht 5\' 10"  (1.778 m)   Wt 195 lb (88.5 kg)   SpO2 98%   BMI 27.98 kg/m      Assessment & Plan:  Earl [name] comes in today with chief complaint of Medical Management of Chronic Issues   Diagnosis and orders addressed:  1. Alcoholic cirrhosis of liver without ascites (HCC) - cyanocobalamin (VITAMIN B12)  injection 1,000 mcg - CMP14+EGFR - CBC with Differential/Platelet  2. Alcohol abuse, in remission - CMP14+EGFR - CBC with Differential/Platelet  3. Depression, recurrent (HCC) - CMP14+EGFR - CBC with Differential/Platelet  4. Essential hypertension (Primary) - diltiazem (CARDIZEM CD) 300 MG 24 hr capsule; Take 1 capsule (300 mg total) by mouth daily.  Dispense: 90 capsule; Refill: 0 - furosemide (LASIX) 20 MG tablet; Take 1 tablet (20 mg total) by mouth daily.  Dispense: 30 tablet; Refill: 3 - CMP14+EGFR - CBC with Differential/Platelet  5. Mixed hyperlipidemia - CMP14+EGFR - CBC with Differential/Platelet  6. Overweight (BMI 25.0-29.9) - CMP14+EGFR - CBC with Differential/Platelet  7. Paroxysmal atrial fibrillation (HCC) - CMP14+EGFR - CBC with Differential/Platelet  8. Vitamin D deficiency - CMP14+EGFR - CBC with Differential/Platelet  9. Vitamin B 12 deficiency - CMP14+EGFR - CBC with Differential/Platelet  10. Tinea cruris - terbinafine (LAMISIL) 250 MG tablet; Take 1 tablet (250 mg total) by mouth daily.  Dispense: 7 tablet; Refill: 0 - CMP14+EGFR - CBC with Differential/Platelet  11. Impacted cerumen of left ear - CMP14+EGFR - CBC with Differential/Platelet  12. Gastroesophageal reflux disease without esophagitis - pantoprazole (PROTONIX) 40 MG tablet; Take 1 tablet (40 mg total) by mouth daily before breakfast.  Dispense: 90 tablet; Refill: 0 - CMP14+EGFR - CBC with Differential/Platelet  Labs pending Will refill medications today, patient does not take most medications regularly but does not want any dose changes.  Keep follow up with specialists  Health Maintenance reviewed Diet and exercise encouraged  Follow up plan: 6 months   Jannifer Rodney, FNP

## 2023-04-29 ENCOUNTER — Telehealth: Payer: Self-pay | Admitting: Cardiovascular Disease

## 2023-04-29 ENCOUNTER — Telehealth: Payer: Self-pay | Admitting: Family

## 2023-04-29 ENCOUNTER — Encounter: Payer: Self-pay | Admitting: Gastroenterology

## 2023-04-29 LAB — CBC WITH DIFFERENTIAL/PLATELET
Basophils Absolute: 0 10*3/uL (ref 0.0–0.2)
Basos: 0 %
EOS (ABSOLUTE): 0.1 10*3/uL (ref 0.0–0.4)
Eos: 2 %
Hematocrit: 44 % (ref 37.5–51.0)
Hemoglobin: 15 g/dL (ref 13.0–17.7)
Immature Grans (Abs): 0 10*3/uL (ref 0.0–0.1)
Immature Granulocytes: 0 %
Lymphocytes Absolute: 1.5 10*3/uL (ref 0.7–3.1)
Lymphs: 29 %
MCH: 31.4 pg (ref 26.6–33.0)
MCHC: 34.1 g/dL (ref 31.5–35.7)
MCV: 92 fL (ref 79–97)
Monocytes Absolute: 0.4 10*3/uL (ref 0.1–0.9)
Monocytes: 8 %
Neutrophils Absolute: 3.2 10*3/uL (ref 1.4–7.0)
Neutrophils: 61 %
Platelets: 136 10*3/uL — ABNORMAL LOW (ref 150–450)
RBC: 4.78 x10E6/uL (ref 4.14–5.80)
RDW: 12.3 % (ref 11.6–15.4)
WBC: 5.3 10*3/uL (ref 3.4–10.8)

## 2023-04-29 LAB — CMP14+EGFR
ALT: 32 IU/L (ref 0–44)
AST: 31 IU/L (ref 0–40)
Albumin: 4.2 g/dL (ref 3.9–4.9)
Alkaline Phosphatase: 132 IU/L — ABNORMAL HIGH (ref 44–121)
BUN/Creatinine Ratio: 15 (ref 10–24)
BUN: 13 mg/dL (ref 8–27)
Bilirubin Total: 0.8 mg/dL (ref 0.0–1.2)
CO2: 19 mmol/L — ABNORMAL LOW (ref 20–29)
Calcium: 8.9 mg/dL (ref 8.6–10.2)
Chloride: 108 mmol/L — ABNORMAL HIGH (ref 96–106)
Creatinine, Ser: 0.87 mg/dL (ref 0.76–1.27)
Globulin, Total: 2.6 g/dL (ref 1.5–4.5)
Glucose: 84 mg/dL (ref 70–99)
Potassium: 4 mmol/L (ref 3.5–5.2)
Sodium: 141 mmol/L (ref 134–144)
Total Protein: 6.8 g/dL (ref 6.0–8.5)
eGFR: 96 mL/min/{1.73_m2} (ref >59)

## 2023-04-29 NOTE — Telephone Encounter (Signed)
 Pt made aware of results and understood. Pt is very unhappy about receiving results prior to provider reviewing. He is thinking about removing his mychart account. He will call back if needed.

## 2023-04-29 NOTE — Telephone Encounter (Signed)
 See 04/09/23 patient message for additional details. Patient will need to contact Jannifer Rodney, FNP regarding lab tests that were completed yesterday as she ordered those tests.

## 2023-04-29 NOTE — Telephone Encounter (Signed)
 Pt c/o of Chest Pain: STAT if active (IN THIS MOMENT) CP, including tightness, pressure, jaw pain, shoulder/upper arm/back pain, SOB, nausea, and vomiting.  1. Are you having CP right now (tightness, pressure, or discomfort)? Not really its more so back pains  2. Are you experiencing any other symptoms (ex. SOB, nausea, vomiting, sweating)? Sob a little   3. How long have you been experiencing CP? 2 days   4. Is your CP continuous or coming and going? Coming and going   5. Have you taken Nitroglycerin? No   6. If CP returns before callback, please consider calling 911. ?

## 2023-04-29 NOTE — Telephone Encounter (Signed)
 Copied from CRM [id number]. Topic: MyChart - Other >> Apr 29, 2023  8:26 AM Earl [name] wrote: Reason for CRM: pt called in very upset with mychart because he can see his test results from the lab work he took 04/28/2023 which said abnormal all through it . He said he hates mychart because something is always going on with it . Marland Kitchen Pt would like someone to call him asap to explain to him why his lab results say abnormal with no explanation .

## 2023-04-29 NOTE — Telephone Encounter (Signed)
 Reports he is unsure if he was having chest pain and he thinks it is his back. Denies active chest pain. Reports he is having active middle of back pain, between shoulder blades, rated 5/10 vs chest pain and a little SOB. Reports he has dizziness all the time due to taking lyrica. Reports elevated HR for the past 2 days but has now come down. Reports he didn't get sleep last night so he took xanax 1 mg today. Reports he took diltiazem 300 mg twice daily inadvertently. BP today 120/70 & 70. Offered appointment to see Philis Nettle on 05/05/2023 but declined. Said he didn't think he needed to see cardiology. Advised to contact PCP and gave ED precautions if symptoms get worse. Advised if he change his mind about being evaluated at our office to call back. Verbalized understanding of plan.

## 2023-04-29 NOTE — Telephone Encounter (Signed)
 Results verbally reviewed with pt. Please see mychart message as well.

## 2023-04-30 ENCOUNTER — Ambulatory Visit: Payer: MEDICAID | Attending: Nurse Practitioner

## 2023-04-30 ENCOUNTER — Telehealth: Payer: Self-pay | Admitting: Cardiovascular Disease

## 2023-04-30 VITALS — BP 110/60 | HR 105

## 2023-04-30 DIAGNOSIS — I484 Atypical atrial flutter: Secondary | ICD-10-CM | POA: Diagnosis not present

## 2023-04-30 DIAGNOSIS — I10 Essential (primary) hypertension: Secondary | ICD-10-CM | POA: Diagnosis not present

## 2023-04-30 MED ORDER — METOPROLOL TARTRATE 25 MG PO TABS: 25.0000 mg | ORAL_TABLET | ORAL | 0 refills | Status: DC | PRN

## 2023-04-30 NOTE — Telephone Encounter (Signed)
 Patient walked into the office stating that his heart rate is beating to fast. No chest pains with some shortness of breath. Patient is very anxious.

## 2023-04-30 NOTE — Addendum Note (Signed)
 Addended by: Sharen Hones on: 04/30/2023 01:01 PM   Modules accepted: Orders

## 2023-04-30 NOTE — Telephone Encounter (Signed)
 Putting in Nurse visit for EKG

## 2023-04-30 NOTE — Progress Notes (Addendum)
 Patient walked into the office stating that his heart rate is beating to fast. No chest pains with some shortness of breath. Patient is very anxious.   Put patient in for Nurse visit he is a previous Patient of Dr.Koneswaren he has not seen regular cardiology since 2018  Did EKG and vitals  Showed Earl [name] in office she stated he is in Afib  Patient is very very anxious I keep asking him to calm down and not get worked up but he keeps talking and panicking.  Pretty sure patient is having a panic attack.  Patient is pacing the room.  Patient seems to have been on some sort of substance not sure what. Has past history of substance abuse.  Let patient leave and will call him with advise. Advised him if symptoms worsen before hearing back from [place] proceed to ED.   Sent to DOD Dr.Branch for advice.    Per JB  please keep the diltiazem at the 300mg  daily it currently is, can start lopressor 25mg  every 8 hrs prn palpitations   Called Patient and left voicemail.  Sent Medication into Pharmacy and sent patient Mychart message  Patient informed and verbalized understanding of plan.

## 2023-04-30 NOTE — Patient Instructions (Signed)
 Medication Instructions:  Your physician has recommended you make the following change in your medication:  Please Start Lopressor 25 Mg Every 8 hours as needed for palpitations   Labwork: None   Testing/Procedures: None   Follow-Up: Your physician recommends that you schedule a follow-up appointment in:   Any Other Special Instructions Will Be Listed Below (If Applicable).  If you need a refill on your cardiac medications before your next appointment, please call your pharmacy.

## 2023-05-01 ENCOUNTER — Emergency Department (HOSPITAL_COMMUNITY): Payer: MEDICAID

## 2023-05-01 ENCOUNTER — Telehealth: Payer: Self-pay | Admitting: Neurology

## 2023-05-01 ENCOUNTER — Other Ambulatory Visit: Payer: Self-pay

## 2023-05-01 ENCOUNTER — Inpatient Hospital Stay (HOSPITAL_COMMUNITY)
Admission: EM | Admit: 2023-05-01 | Discharge: 2023-05-03 | DRG: 101 | Disposition: A | Discharge status: Home / Self Care | Payer: MEDICAID | Attending: Internal Medicine | Admitting: Internal Medicine

## 2023-05-01 DIAGNOSIS — I4892 Unspecified atrial flutter: Secondary | ICD-10-CM | POA: Diagnosis present

## 2023-05-01 DIAGNOSIS — I8510 Secondary esophageal varices without bleeding: Secondary | ICD-10-CM | POA: Diagnosis present

## 2023-05-01 DIAGNOSIS — Z85038 Personal history of other malignant neoplasm of large intestine: Secondary | ICD-10-CM

## 2023-05-01 DIAGNOSIS — F419 Anxiety disorder, unspecified: Secondary | ICD-10-CM | POA: Diagnosis present

## 2023-05-01 DIAGNOSIS — K219 Gastro-esophageal reflux disease without esophagitis: Secondary | ICD-10-CM | POA: Diagnosis present

## 2023-05-01 DIAGNOSIS — E538 Deficiency of other specified B group vitamins: Secondary | ICD-10-CM | POA: Diagnosis present

## 2023-05-01 DIAGNOSIS — R404 Transient alteration of awareness: Principal | ICD-10-CM

## 2023-05-01 DIAGNOSIS — F32A Depression, unspecified: Secondary | ICD-10-CM | POA: Diagnosis present

## 2023-05-01 DIAGNOSIS — Z880 Allergy status to penicillin: Secondary | ICD-10-CM | POA: Diagnosis not present

## 2023-05-01 DIAGNOSIS — R259 Unspecified abnormal involuntary movements: Secondary | ICD-10-CM

## 2023-05-01 DIAGNOSIS — Z8349 Family history of other endocrine, nutritional and metabolic diseases: Secondary | ICD-10-CM

## 2023-05-01 DIAGNOSIS — Z87892 Personal history of anaphylaxis: Secondary | ICD-10-CM | POA: Diagnosis not present

## 2023-05-01 DIAGNOSIS — Z87442 Personal history of urinary calculi: Secondary | ICD-10-CM

## 2023-05-01 DIAGNOSIS — B356 Tinea cruris: Secondary | ICD-10-CM | POA: Diagnosis present

## 2023-05-01 DIAGNOSIS — Z87891 Personal history of nicotine dependence: Secondary | ICD-10-CM | POA: Diagnosis not present

## 2023-05-01 DIAGNOSIS — F1011 Alcohol abuse, in remission: Secondary | ICD-10-CM | POA: Diagnosis present

## 2023-05-01 DIAGNOSIS — Z886 Allergy status to analgesic agent status: Secondary | ICD-10-CM | POA: Diagnosis not present

## 2023-05-01 DIAGNOSIS — R569 Unspecified convulsions: Secondary | ICD-10-CM | POA: Diagnosis present

## 2023-05-01 DIAGNOSIS — K7030 Alcoholic cirrhosis of liver without ascites: Secondary | ICD-10-CM | POA: Diagnosis present

## 2023-05-01 DIAGNOSIS — G629 Polyneuropathy, unspecified: Secondary | ICD-10-CM

## 2023-05-01 DIAGNOSIS — E1140 Type 2 diabetes mellitus with diabetic neuropathy, unspecified: Secondary | ICD-10-CM | POA: Diagnosis present

## 2023-05-01 DIAGNOSIS — E782 Mixed hyperlipidemia: Secondary | ICD-10-CM | POA: Diagnosis present

## 2023-05-01 DIAGNOSIS — E038 Other specified hypothyroidism: Secondary | ICD-10-CM | POA: Diagnosis present

## 2023-05-01 DIAGNOSIS — G5132 Clonic hemifacial spasm, left: Secondary | ICD-10-CM | POA: Diagnosis present

## 2023-05-01 DIAGNOSIS — G40909 Epilepsy, unspecified, not intractable, without status epilepticus: Secondary | ICD-10-CM | POA: Diagnosis present

## 2023-05-01 DIAGNOSIS — Z79899 Other long term (current) drug therapy: Secondary | ICD-10-CM

## 2023-05-01 DIAGNOSIS — R001 Bradycardia, unspecified: Secondary | ICD-10-CM | POA: Diagnosis present

## 2023-05-01 DIAGNOSIS — Z83438 Family history of other disorder of lipoprotein metabolism and other lipidemia: Secondary | ICD-10-CM

## 2023-05-01 DIAGNOSIS — F1020 Alcohol dependence, uncomplicated: Secondary | ICD-10-CM

## 2023-05-01 DIAGNOSIS — I10 Essential (primary) hypertension: Secondary | ICD-10-CM | POA: Diagnosis present

## 2023-05-01 DIAGNOSIS — I8500 Esophageal varices without bleeding: Secondary | ICD-10-CM | POA: Diagnosis present

## 2023-05-01 DIAGNOSIS — Z888 Allergy status to other drugs, medicaments and biological substances status: Secondary | ICD-10-CM

## 2023-05-01 DIAGNOSIS — Z8249 Family history of ischemic heart disease and other diseases of the circulatory system: Secondary | ICD-10-CM | POA: Diagnosis not present

## 2023-05-01 DIAGNOSIS — R4182 Altered mental status, unspecified: Secondary | ICD-10-CM | POA: Diagnosis not present

## 2023-05-01 DIAGNOSIS — G5139 Clonic hemifacial spasm, unspecified: Secondary | ICD-10-CM

## 2023-05-01 DIAGNOSIS — Z820 Family history of epilepsy and other diseases of the nervous system: Secondary | ICD-10-CM

## 2023-05-01 LAB — CBC WITH DIFFERENTIAL/PLATELET
Abs Immature Granulocytes: 0.01 10*3/uL (ref 0.00–0.07)
Basophils Absolute: 0 10*3/uL (ref 0.0–0.1)
Basophils Relative: 0 %
Eosinophils Absolute: 0 10*3/uL (ref 0.0–0.5)
Eosinophils Relative: 0 %
HCT: 42.2 % (ref 39.0–52.0)
Hemoglobin: 14.8 g/dL (ref 13.0–17.0)
Immature Granulocytes: 0 %
Lymphocytes Relative: 14 %
Lymphs Abs: 0.8 10*3/uL (ref 0.7–4.0)
MCH: 31.6 pg (ref 26.0–34.0)
MCHC: 35.1 g/dL (ref 30.0–36.0)
MCV: 90.2 fL (ref 80.0–100.0)
Monocytes Absolute: 0.3 10*3/uL (ref 0.1–1.0)
Monocytes Relative: 5 %
Neutro Abs: 4.4 10*3/uL (ref 1.7–7.7)
Neutrophils Relative %: 81 %
Platelets: 123 10*3/uL — ABNORMAL LOW (ref 150–400)
RBC: 4.68 MIL/uL (ref 4.22–5.81)
RDW: 13.2 % (ref 11.5–15.5)
WBC: 5.4 10*3/uL (ref 4.0–10.5)
nRBC: 0 % (ref 0.0–0.2)

## 2023-05-01 LAB — URINALYSIS, ROUTINE W REFLEX MICROSCOPIC
Bacteria, UA: NONE SEEN
Bilirubin Urine: NEGATIVE
Glucose, UA: NEGATIVE mg/dL
Hgb urine dipstick: NEGATIVE
Ketones, ur: 20 mg/dL — AB
Leukocytes,Ua: NEGATIVE
Nitrite: NEGATIVE
Protein, ur: NEGATIVE mg/dL
Specific Gravity, Urine: 1.012 (ref 1.005–1.030)
pH: 7 (ref 5.0–8.0)

## 2023-05-01 LAB — RAPID URINE DRUG SCREEN, HOSP PERFORMED
Amphetamines: NOT DETECTED
Barbiturates: NOT DETECTED
Benzodiazepines: NOT DETECTED
Cocaine: NOT DETECTED
Opiates: NOT DETECTED
Tetrahydrocannabinol: POSITIVE — AB

## 2023-05-01 LAB — COMPREHENSIVE METABOLIC PANEL WITH GFR
ALT: 27 U/L (ref 0–44)
AST: 27 U/L (ref 15–41)
Albumin: 4.2 g/dL (ref 3.5–5.0)
Alkaline Phosphatase: 94 U/L (ref 38–126)
Anion gap: 11 (ref 5–15)
BUN: 21 mg/dL (ref 8–23)
CO2: 23 mmol/L (ref 22–32)
Calcium: 9.3 mg/dL (ref 8.9–10.3)
Chloride: 106 mmol/L (ref 98–111)
Creatinine, Ser: 0.85 mg/dL (ref 0.61–1.24)
GFR, Estimated: >60 mL/min (ref >60)
Glucose, Bld: 112 mg/dL — ABNORMAL HIGH (ref 70–99)
Potassium: 3.3 mmol/L — ABNORMAL LOW (ref 3.5–5.1)
Sodium: 140 mmol/L (ref 135–145)
Total Bilirubin: 1.3 mg/dL — ABNORMAL HIGH (ref 0.0–1.2)
Total Protein: 7.3 g/dL (ref 6.5–8.1)

## 2023-05-01 LAB — CBG MONITORING, ED: Glucose-Capillary: 103 mg/dL — ABNORMAL HIGH (ref 70–99)

## 2023-05-01 LAB — MAGNESIUM: Magnesium: 2 mg/dL (ref 1.7–2.4)

## 2023-05-01 LAB — AMMONIA: Ammonia: 25 umol/L (ref 9–35)

## 2023-05-01 LAB — ETHANOL: Alcohol, Ethyl (B): <10 mg/dL (ref <10)

## 2023-05-01 MED ORDER — LORAZEPAM 2 MG/ML IJ SOLN
1.0000 mg | Freq: Once | INTRAMUSCULAR | Status: AC
  Administered 2023-05-01: 1 mg via INTRAVENOUS
  Filled 2023-05-01: qty 1

## 2023-05-01 MED ORDER — LEVETIRACETAM IN NACL 1000 MG/100ML IV SOLN
1000.0000 mg | Freq: Once | INTRAVENOUS | Status: AC
  Administered 2023-05-01: 1000 mg via INTRAVENOUS
  Filled 2023-05-01: qty 100

## 2023-05-01 MED ORDER — OXCARBAZEPINE 300 MG PO TABS
300.0000 mg | ORAL_TABLET | Freq: Every day | ORAL | Status: DC
  Administered 2023-05-02 – 2023-05-03 (×2): 300 mg via ORAL
  Filled 2023-05-01 (×2): qty 1

## 2023-05-01 MED ORDER — POLYETHYLENE GLYCOL 3350 17 G PO PACK: 17.0000 g | PACK | Freq: Every day | ORAL | Status: DC | PRN

## 2023-05-01 MED ORDER — ENOXAPARIN SODIUM 40 MG/0.4ML IJ SOSY
40.0000 mg | PREFILLED_SYRINGE | INTRAMUSCULAR | Status: DC
Start: 2023-05-01 — End: 2023-05-03
  Administered 2023-05-01 – 2023-05-02 (×2): 40 mg via SUBCUTANEOUS
  Filled 2023-05-01 (×2): qty 0.4

## 2023-05-01 MED ORDER — MELATONIN 3 MG PO TABS: 3.0000 mg | ORAL_TABLET | Freq: Every evening | ORAL | Status: DC | PRN

## 2023-05-01 MED ORDER — LEVETIRACETAM 750 MG PO TABS
750.0000 mg | ORAL_TABLET | Freq: Two times a day (BID) | ORAL | Status: DC
  Administered 2023-05-01: 750 mg via ORAL
  Filled 2023-05-01: qty 1

## 2023-05-01 MED ORDER — PANTOPRAZOLE SODIUM 40 MG PO TBEC
40.0000 mg | DELAYED_RELEASE_TABLET | Freq: Every day | ORAL | Status: DC
  Administered 2023-05-02 – 2023-05-03 (×2): 40 mg via ORAL
  Filled 2023-05-01 (×2): qty 1

## 2023-05-01 MED ORDER — PREGABALIN 100 MG PO CAPS
200.0000 mg | ORAL_CAPSULE | Freq: Three times a day (TID) | ORAL | Status: DC
  Administered 2023-05-01 – 2023-05-03 (×5): 200 mg via ORAL
  Filled 2023-05-01 (×3): qty 2
  Filled 2023-05-01: qty 8
  Filled 2023-05-01 (×2): qty 2

## 2023-05-01 MED ORDER — ONDANSETRON HCL 4 MG/2ML IJ SOLN
4.0000 mg | Freq: Four times a day (QID) | INTRAMUSCULAR | Status: DC | PRN
  Administered 2023-05-01: 4 mg via INTRAVENOUS
  Filled 2023-05-01: qty 2

## 2023-05-01 MED ORDER — LEVETIRACETAM 500 MG/5ML IV SOLN: 2000.0000 mg | Freq: Once | INTRAVENOUS | Status: DC

## 2023-05-01 MED ORDER — ENSURE ENLIVE PO LIQD
237.0000 mL | Freq: Two times a day (BID) | ORAL | Status: DC
  Administered 2023-05-02: 237 mL via ORAL

## 2023-05-01 MED ORDER — DILTIAZEM HCL ER COATED BEADS 180 MG PO CP24
300.0000 mg | ORAL_CAPSULE | Freq: Every day | ORAL | Status: DC
  Administered 2023-05-02 – 2023-05-03 (×2): 300 mg via ORAL
  Filled 2023-05-01 (×2): qty 1

## 2023-05-01 NOTE — Telephone Encounter (Signed)
 Pt called stating that since he has lost 85lb his pregabalin (LYRICA) 200 MG capsule has been making sick and he would like to discuss which dosage would work for him. Pt also stated that he does not want to have any communication through Mychart. Please advise.

## 2023-05-01 NOTE — H&P (Signed)
 History and Physical    Patient: Earl [name] WUJ:[id number] DOB: 1960/05/03 DOA: 05/01/2023 DOS: the patient was seen and examined on 05/01/2023 PCP: [name], FNP  Patient coming from: Home  Chief Complaint:  Chief Complaint  Patient presents with   Altered Mental Status   HPI: Earl [name] is a 63 y.o. male with medical history significant of pernicious anemia with vitamin B12 deficiency, hypothyroidism, History of alcohol abuse, cirrhosis, history of seizures on Keppra, hypertension, peripheral neuropathy, chronic pain on Lyrica.  Couple weeks ago, he reduced the dose of his Lyrica from 600 mg daily to 400 mg daily because he lost 85 pounds intentionally.  He did not discuss the dose change with his primary care physician.  With this dose change, he began to notice spasms of his face on the left side, along with abdominal pain and nausea. He also began to have staring episodes that would last for up to 15 minutes. His sister, who contributes to the history, called EMS due to these episodes and he was brought to the hospital for evaluation.  Per the patient, he is aware of these episodes, just unable to talk or respond.  Denies chest pain, difficulty breathing, abdominal pain.  Review of Systems: As mentioned in the history of present illness. All other systems reviewed and are negative. Past Medical History:  Diagnosis Date   Alcohol abuse    Anemia    Anxiety    Arthritis    Colon cancer (HCC)    Depression    Dysrhythmia    Gall stones    Heart burn    History of kidney stones    Hyperlipidemia    Hypertension    Hypothyroidism    Liver disease    Pernicious anemia    Tachycardia    Typical atrial flutter (HCC)    Vitamin B12 deficiency    Past Surgical History:  Procedure Laterality Date   A-FLUTTER ABLATION N/A 07/31/2016   Procedure: A-Flutter Ablation;  Surgeon: Hillis Range, MD;  [facility];  Service: Cardiovascular;  Laterality: N/A;    BIOPSY  05/08/2022   Procedure: BIOPSY;  Surgeon: Lynann Bologna, MD;  Location: [facility];  Service: Gastroenterology;;   COLONOSCOPY N/A 04/17/2016   Procedure: COLONOSCOPY;  Surgeon: Malissa Hippo, MD;  [facility];  Service: Endoscopy;  Laterality: N/A;  1200   COLONOSCOPY N/A 05/07/2017   Procedure: COLONOSCOPY;  Surgeon: Malissa Hippo, MD;  [facility];  Service: Endoscopy;  Laterality: N/A;  200   COLONOSCOPY WITH PROPOFOL N/A 07/25/2020   Procedure: COLONOSCOPY WITH PROPOFOL;  Surgeon: Malissa Hippo, MD;  [facility];  Service: Endoscopy;  Laterality: N/A;  12:10   ESOPHAGEAL BANDING  07/25/2020   Procedure: ESOPHAGEAL BANDING;  Surgeon: Malissa Hippo, MD;  [facility];  Service: Endoscopy;;   ESOPHAGOGASTRODUODENOSCOPY (EGD) WITH PROPOFOL N/A 07/25/2020   Procedure: ESOPHAGOGASTRODUODENOSCOPY (EGD) WITH PROPOFOL;  Surgeon: Malissa Hippo, MD;  [facility];  Service: Endoscopy;  Laterality: N/A;   ESOPHAGOGASTRODUODENOSCOPY (EGD) WITH PROPOFOL N/A 04/11/2021   Procedure: ESOPHAGOGASTRODUODENOSCOPY (EGD) WITH PROPOFOL;  Surgeon: Malissa Hippo, MD;  [facility];  Service: Endoscopy;  Laterality: N/A;  1:00   ESOPHAGOGASTRODUODENOSCOPY (EGD) WITH PROPOFOL N/A 05/08/2022   Procedure: ESOPHAGOGASTRODUODENOSCOPY (EGD) WITH PROPOFOL;  Surgeon: Lynann Bologna, MD;  [facility];  Service: Gastroenterology;  Laterality: N/A;   implantable loop recorder placement  01/24/2019  Medtronic Reveal Linq model LNQ 11 implantable loop recorder (SN [id number] ) by Dr Johney Frame for evaluation of palpitations   MINOR CARPAL TUNNEL     right   POLYPECTOMY  04/17/2016   Procedure: POLYPECTOMY;  Surgeon: Malissa Hippo, MD;  [facility];  Service: Endoscopy;;  sigmoid   POLYPECTOMY  07/25/2020   Procedure: POLYPECTOMY;  Surgeon: Malissa Hippo, MD;  [facility];  Service: Endoscopy;;   Social  History:  reports that he quit smoking about 44 years ago. His smoking use included cigarettes. He has never used smokeless tobacco. He reports that he does not currently use alcohol after a past usage of about 8.0 standard drinks of alcohol per week. He reports that he does not use drugs.  Allergies  Allergen Reactions   Amitriptyline Anaphylaxis   Amoxicillin Anaphylaxis and Rash    Has patient had a PCN reaction causing immediate rash, facial/tongue/throat swelling, SOB or lightheadedness with hypotension: Yes Has patient had a PCN reaction causing severe rash involving mucus membranes or skin necrosis: No Has patient had a PCN reaction that required hospitalization: No Has patient had a PCN reaction occurring within the last 10 years: Yes If all of the above answers are "NO", then may proceed with Cephalosporin use.    Asa [Aspirin] Anaphylaxis   Penicillin G Anaphylaxis   Almecillin    Clonidine Derivatives Other (See Comments)    Severe Dry Mouth   Hydrochlorothiazide     Caused rectal bleeding and hematuria   Levothyroxine Nausea And Vomiting    Patient states that the 75 mcg is too strong for him.   Trintellix [Vortioxetine] Nausea And Vomiting    Family History  Problem Relation Age of Onset   Alzheimer's disease Mother    Hyperlipidemia Mother    Gallbladder disease Mother    Heart disease Father    Coronary artery disease Father    Liver disease Father    Thyroid disease Father    Hyperlipidemia Father    Heart attack Father    Cirrhosis Father    Gallbladder disease Sister    Coronary artery disease Paternal Grandfather     Prior to Admission medications   Medication Sig Start Date End Date Taking? Authorizing Provider  ALPRAZolam (XANAX) 1 MG tablet TAKE 1 TABLET BY MOUTH DAILY AS NEEDED FOR ANXIETY. Must last 30 DAYS. 12/29/22   Sater, Pearletha Furl, MD  cyanocobalamin (VITAMIN B12) 1000 MCG/ML injection Inject 1 mL (1,000 mcg total) into the muscle every 30  (thirty) days. 12/29/22   Sater, Pearletha Furl, MD  diltiazem (CARDIZEM CD) 300 MG 24 hr capsule Take 1 capsule (300 mg total) by mouth daily. 04/28/23   [name], FNP  furosemide (LASIX) 20 MG tablet Take 1 tablet (20 mg total) by mouth daily. 04/28/23   [name], FNP  levETIRAcetam (KEPPRA) 750 MG tablet Take 1 tablet (750 mg total) by mouth 2 (two) times daily. 12/30/22   Sater, Pearletha Furl, MD  melatonin 3 MG TABS tablet Take 3 mg by mouth at bedtime as needed (sleep).    [provider]  metoprolol tartrate (LOPRESSOR) 25 MG tablet Take 1 tablet (25 mg total) by mouth as needed (For Palpitations). 04/30/23 07/29/23  Antoine Poche, MD  ondansetron (ZOFRAN) 4 MG tablet TAKE 1 TABLET BY MOUTH TWICE DAILY AS NEEDED FOR NAUSEA OR FOR VOMITING 07/18/21   Rehman, Joline Maxcy, MD  Oxcarbazepine (TRILEPTAL) 300 MG tablet Take 1 tablet (  300 mg total) by mouth 2 (two) times daily. Patient taking differently: Take 300 mg by mouth daily. 12/10/21   Sater, Pearletha Furl, MD  pantoprazole (PROTONIX) 40 MG tablet Take 1 tablet (40 mg total) by mouth daily before breakfast. 04/28/23   Jannifer Rodney A, FNP  pregabalin (LYRICA) 200 MG capsule Take 3 capsules (600 mg total) by mouth daily. Take 3 capsules (600 mg) po daily.    Prescription must last 30 or more days. 02/19/23   Sater, Pearletha Furl, MD  terbinafine (LAMISIL) 250 MG tablet Take 1 tablet (250 mg total) by mouth daily. 04/28/23   [name], FNP    Physical Exam: Vitals:   05/01/23 1800 05/01/23 1815 05/01/23 1830 05/01/23 1900  BP: 139/85 (!) 142/75 (!) 146/80 120/76  Pulse: 80 72 83 83  Resp: 20 16 16 15   Temp:      TempSrc:      SpO2: 100% 97% 98% 99%   General: Older male. Awake and alert and oriented x3. No acute cardiopulmonary distress.  HEENT: Normocephalic atraumatic.  Right and left ears normal in appearance.  Pupils equal, round, reactive to light. Extraocular muscles are intact. Sclerae anicteric and noninjected.  Moist mucosal  membranes. No mucosal lesions.  Facial twitching on the left side of his face Neck: Neck supple without lymphadenopathy. No carotid bruits. No masses palpated.  Cardiovascular: Regular rate with normal S1-S2 sounds. No murmurs, rubs, gallops auscultated. No JVD.  Respiratory: Good respiratory effort with no wheezes, rales, rhonchi. Lungs clear to auscultation bilaterally.  No accessory muscle use. Abdomen: Soft, nontender, nondistended. Active bowel sounds. No masses or hepatosplenomegaly  Skin: No rashes, lesions, or ulcerations.  Dry, warm to touch. 2+ dorsalis pedis and radial pulses. Musculoskeletal: No calf or leg pain. All major joints not erythematous nontender.  No upper or lower joint deformation.  Good ROM.  No contractures  Psychiatric: Intact judgment and insight. Pleasant and cooperative. Neurologic: No focal neurological deficits. Strength is 5/5 and symmetric in upper and lower extremities.  Cranial nerves II through XII are grossly intact.  Data Reviewed: Results for orders placed or performed during the hospital encounter of 05/01/23 (from the past 24 hours)  Comprehensive metabolic panel     Status: Abnormal   Collection Time: 05/01/23  3:40 PM  Result Value Ref Range   Sodium 140 135 - 145 mmol/L   Potassium 3.3 (L) 3.5 - 5.1 mmol/L   Chloride 106 98 - 111 mmol/L   CO2 23 22 - 32 mmol/L   Glucose, Bld 112 (H) 70 - 99 mg/dL   BUN 21 8 - 23 mg/dL   Creatinine, Ser 1.61 0.61 - 1.24 mg/dL   Calcium 9.3 8.9 - 09.6 mg/dL   Total Protein 7.3 6.5 - 8.1 g/dL   Albumin 4.2 3.5 - 5.0 g/dL   AST 27 15 - 41 U/L   ALT 27 0 - 44 U/L   Alkaline Phosphatase 94 38 - 126 U/L   Total Bilirubin 1.3 (H) 0.0 - 1.2 mg/dL   GFR, Estimated >04 >54 mL/min   Anion gap 11 5 - 15  CBC with Differential/Platelet     Status: Abnormal   Collection Time: 05/01/23  3:40 PM  Result Value Ref Range   WBC 5.4 4.0 - 10.5 K/uL   RBC 4.68 4.22 - 5.81 MIL/uL   Hemoglobin 14.8 13.0 - 17.0 g/dL   HCT 09.8  11.9 - 14.7 %   MCV 90.2 80.0 - 100.0 fL  MCH 31.6 26.0 - 34.0 pg   MCHC 35.1 30.0 - 36.0 g/dL   RDW 16.1 09.6 - 04.5 %   Platelets 123 (L) 150 - 400 K/uL   nRBC 0.0 0.0 - 0.2 %   Neutrophils Relative % 81 %   Neutro Abs 4.4 1.7 - 7.7 K/uL   Lymphocytes Relative 14 %   Lymphs Abs 0.8 0.7 - 4.0 K/uL   Monocytes Relative 5 %   Monocytes Absolute 0.3 0.1 - 1.0 K/uL   Eosinophils Relative 0 %   Eosinophils Absolute 0.0 0.0 - 0.5 K/uL   Basophils Relative 0 %   Basophils Absolute 0.0 0.0 - 0.1 K/uL   Immature Granulocytes 0 %   Abs Immature Granulocytes 0.01 0.00 - 0.07 K/uL  Magnesium     Status: None   Collection Time: 05/01/23  3:40 PM  Result Value Ref Range   Magnesium 2.0 1.7 - 2.4 mg/dL  Ethanol     Status: None   Collection Time: 05/01/23  3:40 PM  Result Value Ref Range   Alcohol, Ethyl (B) <10 <10 mg/dL  Ammonia     Status: None   Collection Time: 05/01/23  3:42 PM  Result Value Ref Range   Ammonia 25 9 - 35 umol/L  CBG monitoring, ED     Status: Abnormal   Collection Time: 05/01/23  4:11 PM  Result Value Ref Range   Glucose-Capillary 103 (H) 70 - 99 mg/dL    CT Head Wo Contrast Result Date: 05/01/2023 CLINICAL DATA:  Altered mental status EXAM: CT HEAD WITHOUT CONTRAST TECHNIQUE: Contiguous axial images were obtained from the base of the skull through the vertex without intravenous contrast. RADIATION DOSE REDUCTION: This exam was performed according to the departmental dose-optimization program which includes automated exposure control, adjustment of the mA and/or kV according to patient size and/or use of iterative reconstruction technique. COMPARISON:  CT head 11/25/2021. FINDINGS: Brain: No evidence of acute infarction, hemorrhage, hydrocephalus, extra-axial collection or mass lesion/mass effect. Vascular: No hyperdense vessel or unexpected calcification. Skull: Normal. Negative for fracture or focal lesion. Sinuses/Orbits: No acute finding. Other: None. IMPRESSION: No  acute intracranial abnormality. Electronically Signed   By: Darliss Cheney M.D.   On: 05/01/2023 18:02     Assessment and Plan: No notes have been filed under this hospital service. Service: Hospitalist  Principal Problem:   Seizure [facility]) Active Problems:   Neuropathy   Essential hypertension   Atrial flutter (HCC)   Subclinical hypothyroidism   Alcohol abuse, in remission   Alcoholic cirrhosis (HCC)  Seizure disorder Question seizures Neurology consulted Admit patient to [facility] for EEG Patient loaded with Keppra, which we will continue. Neuropathy on Lyrica Will resume patient as prior dose Hypertension Continue antihypertensives Atrial flutter In sinus rhythm.  Patient not on anticoagulation as CHA2DS2-VASc score is 1 and has increased bleeding risk due to esophageal varices Cirrhosis with esophageal varices with history of alcohol abuse in remission Stable   Advance Care Planning:   Code Status: Full Code confirmed by patient  Consults: neuro  Family Communication: sister present  Severity of Illness: The appropriate patient status for this patient is INPATIENT. Inpatient status is judged to be reasonable and necessary in order to provide the required intensity of service to ensure the patient's safety. [name] presenting symptoms, physical exam findings, and initial radiographic and laboratory data in the context of their chronic comorbidities is felt to place them at high risk for further clinical deterioration. Furthermore,  it is not anticipated that the patient will be medically stable for discharge from the hospital within 2 midnights of admission.   * I certify that at the point of admission it is my clinical judgment that the patient will require inpatient hospital care spanning beyond 2 midnights from the point of admission due to high intensity of service, high risk for further deterioration and high frequency of surveillance  required.*  Author: Levie Heritage, DO 05/01/2023 8:10 PM  For on call review [email or web address].

## 2023-05-01 NOTE — Consult Note (Addendum)
 NEUROLOGY CONSULT NOTE   Date of service: May 01, 2023 Patient Name: KINCADE GRANBERG MRN:  [id number] DOB:  May 16, 1960 Chief Complaint: "AMS" Requesting Provider: Levie Heritage, DO  History of Present Illness  PEARCE LITTLEFIELD is a 63 y.o. male with hx of B12 deficiency, hypothyroidism, alcohol abuse, cirrhosis, seizures-on Keppra, peripheral neuropathy and chronic pain on Lyrica, hypertension, presenting for evaluation of altered mental status History provided by the patient is that a couple of weeks ago he reduced his Lyrica dose from 600 mg daily to 400 mg due to him and definitely losing about 85 pounds.  With this, he started noticing more spasms of his left face which he was told were from a blood vessel pressing on a nerve for which she is seeing a neurologist outpatient (I am not able to see those notes in Care Everywhere).  His sister had provided history to the hospitalist/EDP that he has also been having staring spells where he would stare at times for about 15 minutes without realizing what he is doing.  He says that he usually is aware of what he is doing but just slow to respond at that time.  He feels that changes in Lyrica doses have made him more foggy. He reports that in the past, whenever his Lyrica has either been held or decreasing dose, he has had these issues of feeling foggy and altered mental status. Denies any recent fevers or chills.  Denies chest pain shortness of breath.  Denies nausea vomiting diarrhea.  ROS  Comprehensive ROS performed and pertinent positives documented in HPI   Past History   Past Medical History:  Diagnosis Date   Alcohol abuse    Anemia    Anxiety    Arthritis    Colon cancer (HCC)    Depression    Dysrhythmia    Gall stones    Heart burn    History of kidney stones    Hyperlipidemia    Hypertension    Hypothyroidism    Liver disease    Pernicious anemia    Tachycardia    Typical atrial flutter (HCC)    Vitamin B12 deficiency      Past Surgical History:  Procedure Laterality Date   A-FLUTTER ABLATION N/A 07/31/2016   Procedure: A-Flutter Ablation;  Surgeon: Hillis Range, MD;  [facility];  Service: Cardiovascular;  Laterality: N/A;   BIOPSY  05/08/2022   Procedure: BIOPSY;  Surgeon: Lynann Bologna, MD;  [facility];  Service: Gastroenterology;;   COLONOSCOPY N/A 04/17/2016   Procedure: COLONOSCOPY;  Surgeon: Malissa Hippo, MD;  [facility];  Service: Endoscopy;  Laterality: N/A;  1200   COLONOSCOPY N/A 05/07/2017   Procedure: COLONOSCOPY;  Surgeon: Malissa Hippo, MD;  [facility];  Service: Endoscopy;  Laterality: N/A;  200   COLONOSCOPY WITH PROPOFOL N/A 07/25/2020   Procedure: COLONOSCOPY WITH PROPOFOL;  Surgeon: Malissa Hippo, MD;  [facility];  Service: Endoscopy;  Laterality: N/A;  12:10   ESOPHAGEAL BANDING  07/25/2020   Procedure: ESOPHAGEAL BANDING;  Surgeon: Malissa Hippo, MD;  [facility];  Service: Endoscopy;;   ESOPHAGOGASTRODUODENOSCOPY (EGD) WITH PROPOFOL N/A 07/25/2020   Procedure: ESOPHAGOGASTRODUODENOSCOPY (EGD) WITH PROPOFOL;  Surgeon: Malissa Hippo, MD;  [facility];  Service: Endoscopy;  Laterality: N/A;   ESOPHAGOGASTRODUODENOSCOPY (EGD) WITH PROPOFOL N/A 04/11/2021   Procedure: ESOPHAGOGASTRODUODENOSCOPY (EGD) WITH PROPOFOL;  Surgeon: Malissa Hippo, MD;  [facility];  Service: Endoscopy;  Laterality: N/A;  1:00   ESOPHAGOGASTRODUODENOSCOPY (EGD) WITH PROPOFOL N/A 05/08/2022   Procedure: ESOPHAGOGASTRODUODENOSCOPY (EGD) WITH PROPOFOL;  Surgeon: Lynann Bologna, MD;  [facility];  Service: Gastroenterology;  Laterality: N/A;   implantable loop recorder placement  01/24/2019    Medtronic Reveal Linq model LNQ 11 implantable loop recorder (SN [id number] ) by Dr Johney Frame for evaluation of palpitations   MINOR CARPAL TUNNEL     right   POLYPECTOMY  04/17/2016   Procedure: POLYPECTOMY;   Surgeon: Malissa Hippo, MD;  [facility];  Service: Endoscopy;;  sigmoid   POLYPECTOMY  07/25/2020   Procedure: POLYPECTOMY;  Surgeon: Malissa Hippo, MD;  [facility];  Service: Endoscopy;;    Family History: Family History  Problem Relation Age of Onset   Alzheimer's disease Mother    Hyperlipidemia Mother    Gallbladder disease Mother    Heart disease Father    Coronary artery disease Father    Liver disease Father    Thyroid disease Father    Hyperlipidemia Father    Heart attack Father    Cirrhosis Father    Gallbladder disease Sister    Coronary artery disease Paternal Grandfather     Social History  reports that he quit smoking about 44 years ago. His smoking use included cigarettes. He has never used smokeless tobacco. He reports that he does not currently use alcohol after a past usage of about 8.0 standard drinks of alcohol per week. He reports that he does not use drugs.  Allergies  Allergen Reactions   Amitriptyline Anaphylaxis   Amoxicillin Anaphylaxis and Rash    Has patient had a PCN reaction causing immediate rash, facial/tongue/throat swelling, SOB or lightheadedness with hypotension: Yes Has patient had a PCN reaction causing severe rash involving mucus membranes or skin necrosis: No Has patient had a PCN reaction that required hospitalization: No Has patient had a PCN reaction occurring within the last 10 years: Yes If all of the above answers are "NO", then may proceed with Cephalosporin use.    Asa [Aspirin] Anaphylaxis   Penicillin G Anaphylaxis   Almecillin    Clonidine Derivatives Other (See Comments)    Severe Dry Mouth   Hydrochlorothiazide     Caused rectal bleeding and hematuria   Levothyroxine Nausea And Vomiting    Patient states that the 75 mcg is too strong for him.   Trintellix [Vortioxetine] Nausea And Vomiting    Medications   Current Facility-Administered Medications:    [START ON 05/02/2023] diltiazem  (CARDIZEM CD) 24 hr capsule 300 mg, 300 mg, Oral, Daily, Stinson, Jacob J, DO   enoxaparin (LOVENOX) injection 40 mg, 40 mg, Subcutaneous, Q24H, Stinson, Jacob J, DO   levETIRAcetam (KEPPRA) tablet 750 mg, 750 mg, Oral, BID, Stinson, Jacob J, DO   melatonin tablet 3 mg, 3 mg, Oral, QHS PRN, Levie Heritage, DO   ondansetron [facility]) injection 4 mg, 4 mg, Intravenous, Q6H PRN, Stinson, Jacob J, DO   [START ON 05/02/2023] Oxcarbazepine (TRILEPTAL) tablet 300 mg, 300 mg, Oral, Daily, Stinson, Jacob J, DO   [START ON 05/02/2023] pantoprazole (PROTONIX) EC tablet 40 mg, 40 mg, Oral, QAC breakfast, Stinson, Jacob J, DO   polyethylene glycol (MIRALAX / GLYCOLAX) packet 17 g, 17 g, Oral, Daily PRN, Levie Heritage, DO   pregabalin (LYRICA) capsule 200 mg, 200 mg, Oral, TID, Levie Heritage, DO  Vitals   Vitals:   05/01/23 1815  05/01/23 1830 05/01/23 1900 05/01/23 2059  BP: (!) 142/75 (!) 146/80 120/76 131/81  Pulse: 72 83 83 61  Resp: 16 16 15 18   Temp:    98.1 F (36.7 C)  TempSrc:    Oral  SpO2: 97% 98% 99% 99%    There is no height or weight on file to calculate BMI.  Physical Exam  General: Well-developed well-nourished man in no acute distress HEENT: Normocephalic atraumatic Lungs: Clear Cardiovascular: Regular rhythm Neurological exam He is awake alert oriented x 3 No dysarthria He is somewhat slow to respond to questions but answers questions appropriately No evidence of aphasia Cranial nerves: Pupils equal round react light, extraocular movements intact, visual fields full, has hemifacial spasms on the left side which stopped in between during the encounter and are nonrhythmic.  Face otherwise appears symmetric.  Tongue and palate midline. Motor examination with no drift in any of the 4 extremities. Sensation intact light touch without extinction Coordination examination reveals no gross dysmetria  Labs/Imaging/Neurodiagnostic studies   CBC:  Recent Labs  Lab May 13, 2023 1449  05/01/23 1540  WBC 5.3 5.4  NEUTROABS 3.2 4.4  HGB 15.0 14.8  HCT 44.0 42.2  MCV 92 90.2  PLT 136* 123*   Basic Metabolic Panel:  Lab Results  Component Value Date   NA 140 05/01/2023   K 3.3 (L) 05/01/2023   CO2 23 05/01/2023   GLUCOSE 112 (H) 05/01/2023   BUN 21 05/01/2023   CREATININE 0.85 05/01/2023   CALCIUM 9.3 05/01/2023   GFRNONAA >60 05/01/2023   GFRAA 82 09/06/2019   Lipid Panel:  Lab Results  Component Value Date   LDLCALC 76 09/05/2022   HgbA1c:  Lab Results  Component Value Date   HGBA1C 4.5 (L) 07/26/2019   Urine Drug Screen:     Component Value Date/Time   LABOPIA NONE DETECTED 10/19/2008 1257   COCAINSCRNUR NONE DETECTED 10/19/2008 1257   LABBENZ POSITIVE (A) 10/19/2008 1257   AMPHETMU NONE DETECTED 10/19/2008 1257   THCU NONE DETECTED 10/19/2008 1257   LABBARB  10/19/2008 1257    NONE DETECTED        DRUG SCREEN FOR MEDICAL PURPOSES ONLY.  IF CONFIRMATION IS NEEDED FOR ANY PURPOSE, NOTIFY LAB WITHIN 5 DAYS.        LOWEST DETECTABLE LIMITS FOR URINE DRUG SCREEN Drug Class       Cutoff (ng/mL) Amphetamine      1000 Barbiturate      200 Benzodiazepine   200 Tricyclics       300 Opiates          300 Cocaine          300 THC              50    Alcohol Level     Component Value Date/Time   ETH <10 05/01/2023 1540   INR  Lab Results  Component Value Date   INR 1.1 (H) 04/11/2022   APTT  Lab Results  Component Value Date   APTT 38 (H) 07/27/2019  Ammonia level-25  CT Head without contrast(Personally reviewed): No acute abnormalities    ASSESSMENT   MERLAND HOLNESS is a 63 y.o. male with past history of B12 deficiency, hypothyroidism, alcohol abuse, cirrhosis, seizures on Keppra, peripheral neuropathy and chronic hypertension presents for evaluation of altered mental status and concern for staring spells. Has had altered mental status and feeling of fogginess when his Lyrica doses have been changed in the past.  He  recently  changed his dose from a total of 600 a day to 400 a day as he is intentional weight loss.  This was not done in conjunction with his outpatient doctors. Concern for breakthrough seizures in the setting of changing Lyrica doses versus withdrawal seizures. Discussed with daytime neurologist-recommended LTM EEG for which he is being transferred to [facility]  Impression: Evaluate for seizures, altered mental status-unclear etiology  RECOMMENDATIONS  Admit to hospitalist Frequent rechecks Telemetry Resume home dose of Lyrica Keppra 750 twice daily, Oxcarbazepine 300mg  daily-continue same dose for now Seizure precautions CIWA protocol Long-term EEG-ordered Neurology to follow  ______________________________________________________________________    Dene Gentry, MD [facility]

## 2023-05-01 NOTE — ED Provider Notes (Signed)
 [place] EMERGENCY DEPARTMENT AT [facility] Provider Note   CSN: [id number] Arrival date & time: 05/01/23  1509     History  Chief Complaint  Patient presents with   Altered Mental Status    Earl [name] is a 63 y.o. male.  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 Hx obtained per sister Ms Katrinka Blazing. She was on the phone with her brother and he thinks he may have lyrica withdrawal and that he has lost 85 lbs in the past few months. He threw up all night. He was shaking all over. Quit drinking recently (told by his best friend). He would just stop talking over the phone almost like he'd passed out. Having trouble chewing and swallowing. Lives at home by himself. Unclear when his lkw was.  Patient unable to provide additional history at this time.      Home Medications Prior to Admission medications   Medication Sig Start Date End Date Taking? Authorizing Provider  ALPRAZolam (XANAX) 1 MG tablet TAKE 1 TABLET BY MOUTH DAILY AS NEEDED FOR ANXIETY. Must last 30 DAYS. 12/29/22   Sater, Pearletha Furl, MD  cyanocobalamin (VITAMIN B12) 1000 MCG/ML injection Inject 1 mL (1,000 mcg total) into the muscle every 30 (thirty) days. 12/29/22   Sater, Pearletha Furl, MD  diltiazem (CARDIZEM CD) 300 MG 24 hr capsule Take 1 capsule (300 mg total) by mouth daily. 04/28/23   [name], FNP  furosemide (LASIX) 20 MG tablet Take 1 tablet (20 mg total) by mouth daily. 04/28/23   [name], FNP  levETIRAcetam (KEPPRA) 750 MG tablet Take 1 tablet (750 mg total) by mouth 2 (two) times daily. 12/30/22   Sater, Pearletha Furl, MD  melatonin 3 MG TABS tablet Take 3 mg by mouth at bedtime as needed (sleep).    [provider]  metoprolol tartrate (LOPRESSOR) 25 MG tablet Take 1 tablet (25 mg total) by mouth as needed (For Palpitations). 04/30/23 07/29/23  Antoine Poche, MD  ondansetron (ZOFRAN) 4 MG tablet TAKE 1  TABLET BY MOUTH TWICE DAILY AS NEEDED FOR NAUSEA OR FOR VOMITING 07/18/21   Rehman, Joline Maxcy, MD  Oxcarbazepine (TRILEPTAL) 300 MG tablet Take 1 tablet (300 mg total) by mouth 2 (two) times daily. Patient taking differently: Take 300 mg by mouth daily. 12/10/21   Sater, Pearletha Furl, MD  pantoprazole (PROTONIX) 40 MG tablet Take 1 tablet (40 mg total) by mouth daily before breakfast. 04/28/23   Jannifer Rodney A, FNP  pregabalin (LYRICA) 200 MG capsule Take 3 capsules (600 mg total) by mouth daily. Take 3 capsules (600 mg) po daily.    Prescription must last 30 or more days. 02/19/23   Sater, Pearletha Furl, MD  terbinafine (LAMISIL) 250 MG tablet Take 1 tablet (250 mg total) by mouth daily. 04/28/23   [name], FNP      Allergies    Amitriptyline, Amoxicillin, Asa [aspirin], Penicillin g, Almecillin, Clonidine derivatives, Hydrochlorothiazide, Levothyroxine, and Trintellix [vortioxetine]    Review of Systems   Review of Systems  Physical Exam Updated Vital Signs BP 121/74 (BP Location: Left Arm)   Pulse 64   Temp 97.7 F (36.5 C) (Oral)   Resp 18   SpO2 100%  Physical Exam Vitals and nursing note reviewed.  Constitutional:      General: He is not in acute distress.    Appearance:  He is well-developed.     Comments: Patient is alert and tracks but will occasionally stop responding to questions.  Does have occasional twitching of the left side of his face.  HENT:     Head: Normocephalic and atraumatic.     Right Ear: External ear normal.     Left Ear: External ear normal.     Nose: Nose normal.  Eyes:     Extraocular Movements: Extraocular movements intact.     Conjunctiva/sclera: Conjunctivae normal.     Pupils: Pupils are equal, round, and reactive to light.  Cardiovascular:     Rate and Rhythm: Normal rate and regular rhythm.     Heart sounds: Normal heart sounds.  Pulmonary:     Effort: Pulmonary effort is normal. No respiratory distress.     Breath sounds: Normal breath sounds.   Musculoskeletal:     Cervical back: Normal range of motion and neck supple.     Right lower leg: No edema.     Left lower leg: No edema.  Skin:    General: Skin is warm and dry.  Neurological:     Mental Status: He is alert. Mental status is at baseline.     Cranial Nerves: No cranial nerve deficit.     Motor: No weakness.     Comments: Unable to cooperate fully with full neurologic exam.  Is able to state his age.  Psychiatric:        Mood and Affect: Mood normal.        Behavior: Behavior normal.     ED Results / Procedures / Treatments   Labs (all labs ordered are listed, but only abnormal results are displayed) Labs Reviewed  COMPREHENSIVE METABOLIC PANEL WITH GFR - Abnormal; Notable for the following components:      Result Value   Potassium 3.3 (*)    Glucose, Bld 112 (*)    Total Bilirubin 1.3 (*)    All other components within normal limits  CBC WITH DIFFERENTIAL/PLATELET - Abnormal; Notable for the following components:   Platelets 123 (*)    All other components within normal limits  URINALYSIS, ROUTINE W REFLEX MICROSCOPIC - Abnormal; Notable for the following components:   Ketones, ur 20 (*)    All other components within normal limits  RAPID URINE DRUG SCREEN, HOSP PERFORMED - Abnormal; Notable for the following components:   Tetrahydrocannabinol POSITIVE (*)    All other components within normal limits  CBG MONITORING, ED - Abnormal; Notable for the following components:   Glucose-Capillary 103 (*)    All other components within normal limits  MAGNESIUM  ETHANOL  AMMONIA  BASIC METABOLIC PANEL WITH GFR  CBC  HIV ANTIBODY (ROUTINE TESTING W REFLEX)    EKG EKG Interpretation Date/Time:  Friday May 01 2023 15:23:16 EDT Ventricular Rate:  67 PR Interval:  194 QRS Duration:  108 QT Interval:  428 QTC Calculation: 452 R Axis:   38  Text Interpretation: Sinus rhythm Confirmed by Vonita Moss [phone]) on 05/01/2023 4:53:56 PM  Radiology CT Head  Wo Contrast Result Date: 05/01/2023 CLINICAL DATA:  Altered mental status EXAM: CT HEAD WITHOUT CONTRAST TECHNIQUE: Contiguous axial images were obtained from the base of the skull through the vertex without intravenous contrast. RADIATION DOSE REDUCTION: This exam was performed according to the departmental dose-optimization program which includes automated exposure control, adjustment of the mA and/or kV according to patient size and/or use of iterative reconstruction technique. COMPARISON:  CT head 11/25/2021. FINDINGS: Brain: No  evidence of acute infarction, hemorrhage, hydrocephalus, extra-axial collection or mass lesion/mass effect. Vascular: No hyperdense vessel or unexpected calcification. Skull: Normal. Negative for fracture or focal lesion. Sinuses/Orbits: No acute finding. Other: None. IMPRESSION: No acute intracranial abnormality. Electronically Signed   By: Darliss Cheney M.D.   On: 05/01/2023 18:02    Procedures Procedures    Medications Ordered in ED Medications  pregabalin (LYRICA) capsule 200 mg (200 mg Oral Given 05/01/23 2219)  ondansetron (ZOFRAN) injection 4 mg (4 mg Intravenous Given 05/01/23 2220)  diltiazem (CARDIZEM CD) 24 hr capsule 300 mg (has no administration in time range)  pantoprazole (PROTONIX) EC tablet 40 mg (has no administration in time range)  melatonin tablet 3 mg (has no administration in time range)  levETIRAcetam (KEPPRA) tablet 750 mg (750 mg Oral Given 05/01/23 2220)  Oxcarbazepine (TRILEPTAL) tablet 300 mg (has no administration in time range)  enoxaparin (LOVENOX) injection 40 mg (40 mg Subcutaneous Given 05/01/23 2220)  polyethylene glycol (MIRALAX / GLYCOLAX) packet 17 g (has no administration in time range)  feeding supplement (ENSURE ENLIVE / ENSURE PLUS) liquid 237 mL (has no administration in time range)  LORazepam (ATIVAN) injection 1 mg (1 mg Intravenous Given 05/01/23 1814)  levETIRAcetam (KEPPRA) IVPB 1000 mg/100 mL premix (0 mg Intravenous Stopped  05/01/23 1834)    And  levETIRAcetam (KEPPRA) IVPB 1000 mg/100 mL premix (0 mg Intravenous Stopped 05/01/23 1851)    ED Course/ Medical Decision Making/ A&P Clinical Course as of 05/02/23 0044  Fri May 01, 2023  1814 Discussed with Dr Otelia Limes from neurology. [facility]commends [facility] for [facility].  [RP]  1858 Dr Adrian Blackwater to admit the patient.  [RP]    Clinical Course User Index [RP] Rondel Baton, MD                                 Medical Decision Making Amount and/or Complexity of Data Reviewed Labs: ordered. Radiology: ordered.  Risk Prescription drug management. Decision regarding hospitalization.   DERRON PIPKINS is a 63 y.o. 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    Initial Ddx:  Lyrica withdrawal, alcohol withdrawal, benzodiazepine withdrawal, focal seizures, ICH, stroke, hepatic encephalopathy  MDM/Course:  Patient presents to the emergency department with staring spells and confusion.  Appears to have cut down on alcohol and Lyrica recently.  Does also have benzodiazepines that he was prescribed which she could be withdrawing from as well.  With his facial twitching it appears to have been diagnosed as left hemifacial spasm but could represent a focal seizure.  Ammonia sent because of his alcohol use and cirrhosis which was WNL.  CT head without acute findings.  Upon re-evaluation patient continued to have waxing and waning consciousness.  Does not appear to be in florid alcohol or benzodiazepine withdrawal at this time though.  Discussed with neurology who recommended transfer for EEG.  Patient was given Ativan and Keppra load prior to transfer to avoid any worsening seizures.  This patient presents to the ED for concern of complaints listed in HPI, this involves an extensive number of treatment options, and is a complaint that  carries with it a high risk of complications and morbidity. Disposition including potential need for admission considered.   Dispo: Admit to Floor  Additional history obtained from family Records  reviewed Outpatient Clinic Notes The following labs were independently interpreted: Chemistry and show no acute abnormality I independently reviewed the following imaging with scope of interpretation limited to determining acute life threatening conditions related to emergency care: CT Head and agree with the radiologist interpretation with the following exceptions: none I personally reviewed and interpreted cardiac monitoring: normal sinus rhythm  I personally reviewed and interpreted the pt's EKG: see above for interpretation  I have reviewed the patients home medications and made adjustments as needed Consults: Hospitalist and Neurology Social Determinants of health:  Etoh abuse  Portions of this note were generated with Scientist, clinical (histocompatibility and immunogenetics). Dictation errors may occur despite best attempts at proofreading.     Final Clinical Impression(s) / ED Diagnoses Final diagnoses:  Transient alteration of awareness  Facial spasm  History of alcohol abuse    Rx / DC Orders ED Discharge Orders     None         Rondel Baton, MD 05/02/23 [phone]

## 2023-05-01 NOTE — Telephone Encounter (Signed)
 Call to patient who reports since losing 85lbs, he feels like like 600 mg lyrica was too much so he decreased to 400 mg lyrica. He began to cuss at me, demanding answers and help right this minute. I advised I would follow up with Dr. Epimenio Foot and someone reach back out. I called Dr. Epimenio Foot  and he advised that 400 mg should be sufficient since weight loss but could take up to 600 mg daily in divided doses.  Returned call to patient, no answer. Left message on voicemail with instructions and and return phone call for appointment

## 2023-05-01 NOTE — ED Notes (Signed)
 ED Provider at bedside.

## 2023-05-01 NOTE — ED Triage Notes (Addendum)
 Pt bib [facility] for AMS, per EMS sister was talking on the phone with pt and he would stop responding. EMS states pt has brief periods, lasting a few minutes, where he will not verbally respond, but will follow commands.   Pt reports his Lyrica dose was recently decreased, but has been taking more as he feels better on a higher dose.

## 2023-05-01 NOTE — Plan of Care (Signed)
  Problem: Clinical Measurements: Goal: Diagnostic test results will improve Outcome: Not Progressing   Problem: Clinical Measurements: Goal: Cardiovascular complication will be avoided Outcome: Not Progressing   Problem: Coping: Goal: Level of anxiety will decrease Outcome: Not Progressing   Problem: Safety: Goal: Ability to remain free from injury will improve Outcome: Not Progressing

## 2023-05-02 ENCOUNTER — Inpatient Hospital Stay (HOSPITAL_COMMUNITY): Payer: MEDICAID

## 2023-05-02 DIAGNOSIS — R4182 Altered mental status, unspecified: Secondary | ICD-10-CM | POA: Diagnosis not present

## 2023-05-02 DIAGNOSIS — R569 Unspecified convulsions: Secondary | ICD-10-CM | POA: Diagnosis not present

## 2023-05-02 LAB — BASIC METABOLIC PANEL WITH GFR
Anion gap: 11 (ref 5–15)
BUN: 16 mg/dL (ref 8–23)
CO2: 24 mmol/L (ref 22–32)
Calcium: 9.2 mg/dL (ref 8.9–10.3)
Chloride: 106 mmol/L (ref 98–111)
Creatinine, Ser: 1.01 mg/dL (ref 0.61–1.24)
GFR, Estimated: >60 mL/min (ref >60)
Glucose, Bld: 65 mg/dL — ABNORMAL LOW (ref 70–99)
Potassium: 3.1 mmol/L — ABNORMAL LOW (ref 3.5–5.1)
Sodium: 141 mmol/L (ref 135–145)

## 2023-05-02 LAB — CBC
HCT: 41.7 % (ref 39.0–52.0)
Hemoglobin: 14.2 g/dL (ref 13.0–17.0)
MCH: 31.2 pg (ref 26.0–34.0)
MCHC: 34.1 g/dL (ref 30.0–36.0)
MCV: 91.6 fL (ref 80.0–100.0)
Platelets: 104 10*3/uL — ABNORMAL LOW (ref 150–400)
RBC: 4.55 MIL/uL (ref 4.22–5.81)
RDW: 13.2 % (ref 11.5–15.5)
WBC: 5 10*3/uL (ref 4.0–10.5)
nRBC: 0 % (ref 0.0–0.2)

## 2023-05-02 LAB — GLUCOSE, CAPILLARY
Glucose-Capillary: 102 mg/dL — ABNORMAL HIGH (ref 70–99)
Glucose-Capillary: 115 mg/dL — ABNORMAL HIGH (ref 70–99)
Glucose-Capillary: 118 mg/dL — ABNORMAL HIGH (ref 70–99)

## 2023-05-02 LAB — MAGNESIUM: Magnesium: 2.1 mg/dL (ref 1.7–2.4)

## 2023-05-02 LAB — PHOSPHORUS: Phosphorus: 3.6 mg/dL (ref 2.5–4.6)

## 2023-05-02 LAB — HIV ANTIBODY (ROUTINE TESTING W REFLEX): HIV Screen 4th Generation wRfx: NONREACTIVE

## 2023-05-02 MED ORDER — LEVETIRACETAM IN NACL 1000 MG/100ML IV SOLN
1000.0000 mg | INTRAVENOUS | Status: AC
  Administered 2023-05-02: 1000 mg via INTRAVENOUS
  Filled 2023-05-02: qty 100

## 2023-05-02 MED ORDER — ACETAMINOPHEN 650 MG RE SUPP: 650.0000 mg | Freq: Four times a day (QID) | RECTAL | Status: DC | PRN

## 2023-05-02 MED ORDER — ONDANSETRON HCL 4 MG PO TABS: 4.0000 mg | ORAL_TABLET | Freq: Four times a day (QID) | ORAL | Status: DC | PRN

## 2023-05-02 MED ORDER — LEVETIRACETAM 500 MG PO TABS
1000.0000 mg | ORAL_TABLET | Freq: Two times a day (BID) | ORAL | Status: DC
Start: 2023-05-02 — End: 2023-05-03
  Administered 2023-05-02 – 2023-05-03 (×3): 1000 mg via ORAL
  Filled 2023-05-02 (×4): qty 2

## 2023-05-02 MED ORDER — ACETAMINOPHEN 325 MG PO TABS: 650.0000 mg | ORAL_TABLET | Freq: Four times a day (QID) | ORAL | Status: DC | PRN

## 2023-05-02 MED ORDER — ENSURE ENLIVE PO LIQD
237.0000 mL | Freq: Three times a day (TID) | ORAL | Status: DC
  Administered 2023-05-02 – 2023-05-03 (×3): 237 mL via ORAL

## 2023-05-02 MED ORDER — LORAZEPAM 2 MG/ML IJ SOLN
1.0000 mg | Freq: Once | INTRAMUSCULAR | Status: AC
  Administered 2023-05-02: 1 mg via INTRAVENOUS
  Filled 2023-05-02: qty 1

## 2023-05-02 MED ORDER — CLOTRIMAZOLE 1 % EX CREA
TOPICAL_CREAM | Freq: Two times a day (BID) | CUTANEOUS | Status: DC
  Filled 2023-05-02: qty 15

## 2023-05-02 MED ORDER — ALPRAZOLAM 0.25 MG PO TABS: 0.5000 mg | ORAL_TABLET | Freq: Two times a day (BID) | ORAL | Status: DC | PRN

## 2023-05-02 MED ORDER — TERBINAFINE HCL 250 MG PO TABS
250.0000 mg | ORAL_TABLET | Freq: Every day | ORAL | Status: DC
  Filled 2023-05-02: qty 1

## 2023-05-02 MED ORDER — HYDROCORTISONE 1 % EX CREA
1.0000 | TOPICAL_CREAM | Freq: Three times a day (TID) | CUTANEOUS | Status: DC | PRN
  Administered 2023-05-02: 1 via TOPICAL
  Filled 2023-05-02: qty 28

## 2023-05-02 MED ORDER — ONDANSETRON HCL 4 MG/2ML IJ SOLN
4.0000 mg | Freq: Four times a day (QID) | INTRAMUSCULAR | Status: DC | PRN
Start: 2023-05-02 — End: 2023-05-03

## 2023-05-02 MED ORDER — LEVETIRACETAM 500 MG PO TABS: 1000.0000 mg | ORAL_TABLET | Freq: Two times a day (BID) | ORAL | Status: DC

## 2023-05-02 NOTE — Progress Notes (Signed)
 [facility] Progress Note Patient: Earl [name] ZOX:[id number] DOB: Jun 01, 1960 DOA: 05/01/2023  DOS: the patient was seen and examined on 05/02/2023  Brief Hospital Course: PMH of colon cancer, depression, HTN, hypothyroidism, alcohol abuse, anxiety, left facial spasms, B12 deficiency, liver cirrhosis, palpitation. Presented to the hospital as he started to taper himself off of the Lyrica from his 600 mg daily dose to 400 mg daily dose. On the day of the arrival he was unresponsive with staring spells. Initially presented to [facility].  Was transferred to [facility].  LTM EEG was started after neurology consultation. Assessment and Plan: Seizures. LTM EEG concerning for seizures. Neurology consult appreciated. On Keppra.  Dose adjusted. Continue Lyrica. Monitor.  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 Patient is chronically on Lyrica 600 mg. Trying to self taper. Will continue the same home dose right now.  Anxiety and depression. On Trileptal, Xanax.  Continue the same for now.  Palpitation. On Cardizem. Will continue the same for now.  Tinea cruris. Was actually prescribed terbinafine but given his history of cirrhosis I would recommend not to use the medication. Will use topical clotrimazole.  GERD. Esophageal varices. Continue PPI. Patient is on metoprolol and Cardizem although he might benefit from Inderal or nadolol in the long-term.  Liver cirrhosis. History of alcohol abuse (currently does not drink alcohol. No evidence of asterixis. Outpatient follow-up with GI as already scheduled. Continue PPI.   Subjective: No nausea no vomiting no fever no chills.  Continues to have left facial twitches.  Oral intake is adequate.  Sister at bedside.  Per the patient is back to baseline.  Physical Exam: General: in Mild distress, No Rash Cardiovascular: S1 and S2 Present, No Murmur Respiratory: Good respiratory effort, Bilateral Air entry present. No Crackles, No  wheezes Abdomen: Bowel Sound present, No tenderness Extremities: No edema Neuro: Alert and oriented x3, no new focal deficit  Data Reviewed: I have Reviewed nursing notes, Vitals, and Lab results. Since last encounter, pertinent lab results CBC and BMP   . I have ordered test including CBC and BMP  .   Disposition: Status is: Inpatient Remains inpatient appropriate because: Monitor for neurologic clearance.  enoxaparin (LOVENOX) injection 40 mg Start: 05/01/23 2200   Family Communication: Sister at bedside Level of care: Telemetry Medical   Vitals:   05/02/23 0324 05/02/23 0848 05/02/23 1209 05/02/23 1601  BP: 115/68 92/74 111/67 115/69  Pulse: (!) 57 (!) 51 67 (!) 53  Resp: 18 17  17   Temp: 98.3 F (36.8 C) (!) 97.4 F (36.3 C) 98.2 F (36.8 C) (!) 97.5 F (36.4 C)  TempSrc: Oral Oral Oral Oral  SpO2: 100% 100% 98% 100%     Author: Lynden Oxford, MD 05/02/2023 4:14 PM  Please look on [email or web address] to find out who is on call.

## 2023-05-02 NOTE — Progress Notes (Signed)
 LTM EEG hooked up and running - no initial skin breakdown - push button tested - [facility] monitoring.

## 2023-05-02 NOTE — Plan of Care (Signed)
  Problem: Education: Goal: Knowledge of General Education information will improve Description: Including pain rating scale, medication(s)/side effects and non-pharmacologic comfort measures Outcome: Progressing   Problem: Clinical Measurements: Goal: Respiratory complications will improve Outcome: Progressing Goal: Cardiovascular complication will be avoided Outcome: Progressing   Problem: Activity: Goal: Risk for activity intolerance will decrease Outcome: Progressing   Problem: Nutrition: Goal: Adequate nutrition will be maintained Outcome: Progressing   Problem: Elimination: Goal: Will not experience complications related to urinary retention Outcome: Progressing   Problem: Skin Integrity: Goal: Risk for impaired skin integrity will decrease Outcome: Progressing

## 2023-05-02 NOTE — Procedures (Signed)
 Patient Name: Earl [name]  MRN: [id number]  Epilepsy Attending: Charlsie Quest  Referring Physician/Provider: Milon Dikes, MD  Duration: 05/02/2023 0210 to 05/03/2023 0210  Patient history: 63 y.o. male with past history of B12 deficiency, hypothyroidism, alcohol abuse, cirrhosis, seizures on Keppra, peripheral neuropathy and chronic hypertension presents for evaluation of altered mental status and concern for staring spells. EEG to evaluate for seizure  Level of alertness: Awake, asleep  AEDs during EEG study: LEV, OXC, PGB  Technical aspects: This EEG study was done with scalp electrodes positioned according to the 10-20 International system of electrode placement. Electrical activity was reviewed with band pass filter of 1-70Hz , sensitivity of 7 uV/mm, display speed of 78mm/sec with a 60Hz  notched filter applied as appropriate. EEG data were recorded continuously and digitally stored.  Video monitoring was available and reviewed as appropriate.  Description: The posterior dominant rhythm consists of 9 Hz activity of moderate voltage (25-35 uV) seen predominantly in posterior head regions, symmetric and reactive to eye opening and eye closing. Sleep was characterized by vertex waves, sleep spindles (12 to 14 Hz), maximal frontocentral region. EEG showed near continuous, at times rhythmic sharply contoured 3 to 6 Hz theta-delta slowing in left fronto-parietal region with waxing and waning morphology  Seizure was noted on 05/02/2023 at 0210 arising from left fronto-parietal region. During seizure, no clinical signs were noted. EEG showed sharply contoured 5-6Hz  theta slowing in left fronto-parietal region admixed with sharp waves which gradually evolved into 2-3hz  delta slowing and involved all of left hemisphere. Duration of seizure was about 9 minutes ( of note eeg was connected at 0210 and patient was already seizing at the time so duration is likely an underestimate)  Seizure was noted on  05/02/2023 at 0338 arising from left fronto-parietal region. During the seizure, patient was noted to be moving around in bed, fumbling with objects. EEG showed sharply contoured 5-6Hz  theta slowing in left fronto-parietal region admixed with sharp waves which gradually evolved into 2-3hz  delta slowing and involved all of left hemisphere. Duration of seizure was about 17 minutes   Hyperventilation and photic stimulation were not performed.     ABNORMALITY - Focal seizure, left fronto-parietal region - Continuous slow,  left fronto-parietal region  IMPRESSION: This study showed two seizures as described above arising from  left fronto-parietal region, on  05/02/2023 at 0210 and 0338, lasting about 9 minutes and 17 minutes.   Additionally there is cortical dysfunction arising from left fronto-parietal region likely secondary to underlying structural abnormality, post-ictal state.   Dr Wilford Corner was notified.  Nicholis Stepanek Annabelle Harman

## 2023-05-02 NOTE — Plan of Care (Addendum)
 EEG with left-sided slowing that is waxing and waning. Giving 1 dose of Ativan and increasing Keppra to 1 g twice daily. Additional 1g IV now. Continue LTM EEG  -- Milon Dikes, MD Neurologist [facility]

## 2023-05-02 NOTE — Plan of Care (Signed)

## 2023-05-02 NOTE — Evaluation (Signed)
 Occupational Therapy Evaluation Patient Details Name: Earl [name] MRN: [id number] DOB: February 05, 1960 Today's Date: 05/02/2023   History of Present Illness   Pt is a 63 yo male admitted to [facility] on 05/01/23 for seizure episodes. PMH of anemia, hypothyroidism, alcohol abuse, cirrhosis, seizures on keppra, HTN, peripheral neuropathy.     Clinical Impressions PTA pt reports being ind with ADLs, medication management, bill pay, and mobility, does not drive but uses apps to get groceries and pharmacy delivers his medications. Pt currently presenting close to functional baseline in regards to ADL performance and balance, ind with both while in room. Pt does display some STM deficits to which he reports is baseline, would benefit from follow-up assessment to ensure cognition intact and pt independence with iADLs. OT to follow-up with pt to further assess cognition and educate pt on compensatory strategies prn. Anticipate no post acute OT needed at DC.     If plan is discharge home, recommend the following:   Direct supervision/assist for medications management     Functional Status Assessment   Patient has had a recent decline in their functional status and/or demonstrates limited ability to make significant improvements in function in a reasonable and predictable amount of time     Equipment Recommendations   None recommended by OT     Recommendations for Other Services         Precautions/Restrictions   Precautions Precautions: Fall Recall of Precautions/Restrictions: Intact Restrictions Weight Bearing Restrictions Per Provider Order: No     Mobility Bed Mobility               General bed mobility comments: Pt up in recliner on arrival    Transfers Overall transfer level: Independent Equipment used: None                      Balance Overall balance assessment: Mild deficits observed, not formally tested                                          ADL either performed or assessed with clinical judgement   ADL Overall ADL's : Independent                                       General ADL Comments: Pt completing standing sink ADLs without any LOB, ambulatory ind around room no AD. limited in distance by LTM EEG but pt overall ind with ADLs     Vision Baseline Vision/History: 0 No visual deficits Vision Assessment?: No apparent visual deficits     Perception         Praxis         Pertinent Vitals/Pain Pain Assessment Pain Assessment: Faces Faces Pain Scale: Hurts little more Pain Location: chronic back Pain Descriptors / Indicators: Aching Pain Intervention(s): Limited activity within patient's tolerance, Monitored during session     Extremity/Trunk Assessment Upper Extremity Assessment Upper Extremity Assessment: Overall WFL for tasks assessed   Lower Extremity Assessment Lower Extremity Assessment: Overall WFL for tasks assessed   Cervical / Trunk Assessment Cervical / Trunk Assessment: Normal   Communication Communication Communication: No apparent difficulties   Cognition Arousal: Alert Behavior During Therapy: WFL for tasks assessed/performed Cognition: Cognition impaired       Memory impairment (select all impairments): Short-term memory  OT - Cognition Comments: pt reports he has STM deficits at baseline, He needed increased time for simple math equations. Would benefit from formal cognitive assessment, was not able to recall one of the other medication he claims to take at home                 Following commands: Intact       Cueing  General Comments   Cueing Techniques: Verbal cues  LTM EEG intact   Exercises     Shoulder Instructions      Home Living Family/patient expects to be discharged to:: Private residence Living Arrangements: Alone   Type of Home: House Home Access: Level entry     Home Layout: One level     Bathroom Shower/Tub:  Producer, television/film/video:  (comfort height)     Home Equipment: None          Prior Functioning/Environment Prior Level of Function : Independent/Modified Independent             Mobility Comments: Ind no AD ADLs Comments: Ind; gets meals and groceiries sent to him via apps.    OT Problem List: Decreased cognition   OT Treatment/Interventions: Self-care/ADL training;Balance training;Therapeutic exercise;Therapeutic activities;Cognitive remediation/compensation;Patient/family education      OT Goals(Current goals can be found in the care plan section)   Acute Rehab OT Goals Patient Stated Goal: To go home OT Goal Formulation: With patient Time For Goal Achievement: 05/16/23 Potential to Achieve Goals: Good ADL Goals Additional ADL Goal #1: pt will score WFL on a cognitive assessment to demonstrate independence with medication management. Additional ADL Goal #3: Pt will verbalize understanding of 2 compensatory strategies for memory deficits.   OT Frequency:  Min 1X/week    Co-evaluation              AM-PAC OT "6 Clicks" Daily Activity     Outcome Measure Help from another person eating meals?: None Help from another person taking care of personal grooming?: None Help from another person toileting, which includes using toliet, bedpan, or urinal?: None Help from another person bathing (including washing, rinsing, drying)?: None Help from another person to put on and taking off regular upper body clothing?: None Help from another person to put on and taking off regular lower body clothing?: None 6 Click Score: 24   End of Session Equipment Utilized During Treatment: Gait belt Nurse Communication: Mobility status  Activity Tolerance: Patient tolerated treatment well Patient left: in chair;with call bell/phone within reach;with chair alarm set  OT Visit Diagnosis: Other symptoms and signs involving cognitive function                Time:  1717-1740 OT Time Calculation (min): 23 min Charges:  OT General Charges $OT Visit: 1 Visit OT Evaluation $OT Eval Low Complexity: 1 Low OT Treatments $Therapeutic Activity: 8-22 mins  05/02/2023  AB, OTR/L  Acute Rehabilitation Services  Office: [phone]   Tristan Schroeder 05/02/2023, 6:15 PM

## 2023-05-02 NOTE — Hospital Course (Addendum)
 PMH of colon cancer, depression, HTN, hypothyroidism, alcohol abuse, anxiety, left facial spasms, B12 deficiency, liver cirrhosis, palpitation. Presented to the hospital as he started to taper himself off of the Lyrica from his 600 mg daily dose to 400 mg daily dose. On the day of the arrival he was unresponsive with staring spells. Initially presented to [facility].  Was transferred to [facility].  LTM EEG was started after neurology consultation. Assessment and Plan: Seizures. LTM EEG concerning for seizures. Neurology consult appreciated. On Keppra.  Dose adjusted. Continue Lyrica. Monitor.  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 Patient is chronically on Lyrica 600 mg. Trying to self taper. Will continue the same home dose right now.  Anxiety and depression. On Trileptal, Xanax.  Continue the same for now.  Palpitation. On Cardizem. Will continue the same for now.  Tinea cruris. Was actually prescribed terbinafine but given his history of cirrhosis I would recommend not to use the medication. Will use topical clotrimazole.  GERD. Esophageal varices. Continue PPI. Patient is on metoprolol and Cardizem although he might benefit from Inderal or nadolol in the long-term.  Liver cirrhosis. History of alcohol abuse (currently does not drink alcohol. No evidence of asterixis. Outpatient follow-up with GI as already scheduled. Continue PPI.

## 2023-05-02 NOTE — Progress Notes (Signed)
 NEUROLOGY CONSULT FOLLOW UP NOTE   Date of service: May 02, 2023 Patient Name: Earl [name] MRN:  [id number] DOB:  1960-05-29  Interval Hx/subjective   No family at the bedside.  Patient sitting in the chair eating lunch in no apparent distress He is on LTM, with LTM readThis study showed two seizures as described above arising from  left fronto-parietal region, on  05/02/2023 at 0210 and 0338, lasting about 9 minutes and 17 minutes.    Keppra was increased to 1 g twice daily.  Will continue LTM Patient still has left-sided facial twitching  Vitals   Vitals:   05/02/23 0006 05/02/23 0324 05/02/23 0848 05/02/23 1209  BP: 121/74 115/68 92/74 111/67  Pulse: 64 (!) 57 (!) 51 67  Resp: 18 18 17    Temp: 97.7 F (36.5 C) 98.3 F (36.8 C) (!) 97.4 F (36.3 C) 98.2 F (36.8 C)  TempSrc: Oral Oral Oral Oral  SpO2: 100% 100% 100% 98%     There is no height or weight on file to calculate BMI.  Physical Exam   Constitutional: Appears well-developed and well-nourished.  Psych: Affect appropriate to situation.  Eyes: No scleral injection.  HENT: No OP obstrucion.  Head: Normocephalic.  Cardiovascular: Normal rate and regular rhythm.  Respiratory: Effort normal, non-labored breathing.  GI: Soft.  No distension. There is no tenderness.  Skin: WDI.   Neurologic Examination   Mental Status -  Level of arousal and orientation to time, place, and person were intact. Language including expression, naming, repetition, comprehension was assessed and found intact.  Slight hesitancy of speech Attention span and concentration were normal.  Cranial Nerves II - XII - II - Visual field intact OU. III, IV, VI - Extraocular movements intact. V - Facial sensation intact bilaterally.  Left face/eye twitching VII - Facial movement intact bilaterally. VIII - Hearing & vestibular intact bilaterally. X - Palate elevates symmetrically. XI - Chin turning & shoulder shrug intact bilaterally. XII -  Tongue protrusion intact.  Motor Strength - [name] strength was normal in all extremities and pronator drift was absent.  Bulk was normal and fasciculations were absent.   Motor Tone - Muscle tone was assessed at the neck and appendages and was normal. Sensory - Light touch, temperature/pinprick were assessed and were symmetrical.   Coordination - The patient had normal movements in the hands and feet with no ataxia or dysmetria.  Tremor was absent. Gait and Station - deferred.  Medications  Current Facility-Administered Medications:    diltiazem (CARDIZEM CD) 24 hr capsule 300 mg, 300 mg, Oral, Daily, Earl Heritage, DO, 300 mg at 05/02/23 0934   enoxaparin (LOVENOX) injection 40 mg, 40 mg, Subcutaneous, Q24H, Earl Heritage, DO, 40 mg at 05/01/23 2220   feeding supplement (ENSURE ENLIVE / ENSURE PLUS) liquid 237 mL, 237 mL, Oral, BID BM, Earl Heritage, DO, 237 mL at 05/02/23 0936   levETIRAcetam (KEPPRA) tablet 1,000 mg, 1,000 mg, Oral, BID, Earl Dikes, MD, 1,000 mg at 05/02/23 4098   melatonin tablet 3 mg, 3 mg, Oral, QHS PRN, Earl Heritage, DO   ondansetron [facility]) injection 4 mg, 4 mg, Intravenous, Q6H PRN, Earl Heritage, DO, 4 mg at 05/01/23 2220   Oxcarbazepine (TRILEPTAL) tablet 300 mg, 300 mg, Oral, Daily, Earl Heritage, DO, 300 mg at 05/02/23 0936   pantoprazole (PROTONIX) EC tablet 40 mg, 40 mg, Oral, QAC breakfast, Earl Heritage, DO, 40 mg at 05/02/23 1191   polyethylene  glycol (MIRALAX / GLYCOLAX) packet 17 g, 17 g, Oral, Daily PRN, Earl Heritage, DO   pregabalin (LYRICA) capsule 200 mg, 200 mg, Oral, TID, Earl Heritage, DO, 200 mg at 05/02/23 0934  Labs and Diagnostic Imaging   CBC:  Recent Labs  Lab 04/28/23 1449 05/01/23 1540 05/02/23 0900  WBC 5.3 5.4 5.0  NEUTROABS 3.2 4.4  --   HGB 15.0 14.8 14.2  HCT 44.0 42.2 41.7  MCV 92 90.2 91.6  PLT 136* 123* 104*    Basic Metabolic Panel:  Lab Results  Component Value Date   NA 141  05/02/2023   K 3.1 (L) 05/02/2023   CO2 24 05/02/2023   GLUCOSE 65 (L) 05/02/2023   BUN 16 05/02/2023   CREATININE 1.01 05/02/2023   CALCIUM 9.2 05/02/2023   GFRNONAA >60 05/02/2023   GFRAA 82 09/06/2019   Lipid Panel:  Lab Results  Component Value Date   LDLCALC 76 09/05/2022   HgbA1c:  Lab Results  Component Value Date   HGBA1C 4.5 (L) 07/26/2019   Urine Drug Screen:     Component Value Date/Time   LABOPIA NONE DETECTED 05/01/2023 2210   COCAINSCRNUR NONE DETECTED 05/01/2023 2210   LABBENZ NONE DETECTED 05/01/2023 2210   AMPHETMU NONE DETECTED 05/01/2023 2210   THCU POSITIVE (A) 05/01/2023 2210   LABBARB NONE DETECTED 05/01/2023 2210    Alcohol Level     Component Value Date/Time   ETH <10 05/01/2023 1540   INR  Lab Results  Component Value Date   INR 1.1 (H) 04/11/2022   APTT  Lab Results  Component Value Date   APTT 38 (H) 07/27/2019   AED levels: No results found for: "PHENYTOIN", "ZONISAMIDE", "LAMOTRIGINE", "LEVETIRACETA"  CT Head without contrast(Personally reviewed): No acute abnormality  LTM EEG 4/5:  This study showed two seizures as described above arising from  left fronto-parietal region, on  05/02/2023 at 0210 and 0338, lasting about 9 minutes and 17 minutes.    Additionally there is cortical dysfunction arising from left fronto-parietal region likely secondary to underlying structural abnormality, post-ictal state.   Assessment   Earl [name] is a 63 y.o. male with past history of B12 deficiency, hypothyroidism, alcohol abuse, cirrhosis, seizures on Keppra, peripheral neuropathy and chronic hypertension presents for evaluation of altered mental status and concern for staring spells. Has had altered mental status and feeling of fogginess when his Lyrica doses have been changed in the past.  He recently changed his dose from a total of 600 a day to 400 a day as he is intentional weight loss.  This was not done in conjunction with his outpatient  doctors. Concern for breakthrough seizures in the setting of changing Lyrica doses versus withdrawal seizures.  Recommendations  Seizures precautions  Continue LTM  Continue Keppra 1000mg  BID, Continue oxcarbazepine 300mg  and lyrica 200mg   Neurology will continue to follow  ______________________________________________________________________   Signed, Mathews Argyle, NP [facility]

## 2023-05-03 ENCOUNTER — Inpatient Hospital Stay (HOSPITAL_COMMUNITY): Payer: MEDICAID

## 2023-05-03 ENCOUNTER — Encounter (HOSPITAL_COMMUNITY): Payer: MEDICAID

## 2023-05-03 DIAGNOSIS — R569 Unspecified convulsions: Secondary | ICD-10-CM | POA: Diagnosis not present

## 2023-05-03 LAB — CBC
HCT: 39.6 % (ref 39.0–52.0)
Hemoglobin: 13.8 g/dL (ref 13.0–17.0)
MCH: 31.7 pg (ref 26.0–34.0)
MCHC: 34.8 g/dL (ref 30.0–36.0)
MCV: 91 fL (ref 80.0–100.0)
Platelets: 94 10*3/uL — ABNORMAL LOW (ref 150–400)
RBC: 4.35 MIL/uL (ref 4.22–5.81)
RDW: 13.1 % (ref 11.5–15.5)
WBC: 4.3 10*3/uL (ref 4.0–10.5)
nRBC: 0 % (ref 0.0–0.2)

## 2023-05-03 LAB — BASIC METABOLIC PANEL WITH GFR
Anion gap: 8 (ref 5–15)
BUN: 17 mg/dL (ref 8–23)
CO2: 27 mmol/L (ref 22–32)
Calcium: 8.6 mg/dL — ABNORMAL LOW (ref 8.9–10.3)
Chloride: 106 mmol/L (ref 98–111)
Creatinine, Ser: 1.06 mg/dL (ref 0.61–1.24)
GFR, Estimated: >60 mL/min (ref >60)
Glucose, Bld: 84 mg/dL (ref 70–99)
Potassium: 3.4 mmol/L — ABNORMAL LOW (ref 3.5–5.1)
Sodium: 141 mmol/L (ref 135–145)

## 2023-05-03 LAB — GLUCOSE, CAPILLARY
Glucose-Capillary: 85 mg/dL (ref 70–99)
Glucose-Capillary: 85 mg/dL (ref 70–99)
Glucose-Capillary: 108 mg/dL — ABNORMAL HIGH (ref 70–99)

## 2023-05-03 MED ORDER — CLOTRIMAZOLE 1 % EX CREA: TOPICAL_CREAM | Freq: Two times a day (BID) | CUTANEOUS | 0 refills | Status: DC

## 2023-05-03 MED ORDER — ENSURE ENLIVE PO LIQD: 237.0000 mL | Freq: Three times a day (TID) | ORAL | 0 refills | Status: DC

## 2023-05-03 MED ORDER — PREGABALIN 200 MG PO CAPS: 200.0000 mg | ORAL_CAPSULE | Freq: Three times a day (TID) | ORAL | Status: DC

## 2023-05-03 MED ORDER — DILTIAZEM HCL ER COATED BEADS 240 MG PO CP24: 240.0000 mg | ORAL_CAPSULE | Freq: Every day | ORAL | 0 refills | Status: DC

## 2023-05-03 MED ORDER — LEVETIRACETAM 1000 MG PO TABS: 1000.0000 mg | ORAL_TABLET | Freq: Two times a day (BID) | ORAL | 0 refills | Status: DC

## 2023-05-03 NOTE — Progress Notes (Signed)
 LTM EEG discontinued - no skin breakdown at [facility].

## 2023-05-03 NOTE — Progress Notes (Signed)
 NEUROLOGY CONSULT FOLLOW UP NOTE   Date of service: May 03, 2023 Patient Name: Earl [name] MRN:  [id number] DOB:  [birth year]  Interval Hx/subjective   No family at the bedside.  Patient sitting in the bed. No complaints and reporting feeling great today.  Vitals   Vitals:   05/02/23 2015 05/02/23 2352 05/03/23 0436 05/03/23 0803  BP: 97/66 (!) 100/56 94/69 110/72  Pulse: 61 (!) 58 (!) 59 (!) 44  Resp: 18 16 16 17   Temp: (!) 97.3 F (36.3 C) (!) 97.5 F (36.4 C) (!) 97.5 F (36.4 C) 97.7 F (36.5 C)  TempSrc: Oral Oral Oral Oral  SpO2: 98% 98% 99% 99%     There is no height or weight on file to calculate BMI.  Physical Exam   General: Laying comfortably in bed; in no acute distress.  HENT: Normal oropharynx and mucosa. Normal external appearance of ears and nose.  Neck: Supple, no pain or tenderness  CV: No JVD. No peripheral edema.  Pulmonary: Symmetric Chest rise. Normal respiratory effort.  Abdomen: Soft to touch, non-tender.  Ext: No cyanosis, edema, or deformity  Skin: No rash. Normal palpation of skin.   Musculoskeletal: Normal digits and nails by inspection. No clubbing.   Neurologic Examination  Mental status/Cognition: Alert, oriented to self, place, month and year, good attention.  Speech/language: Fluent, comprehension intact, object naming intact, repetition intact.  Cranial nerves:   CN II Pupils equal and reactive to light, no VF deficits    CN III,IV,VI EOM intact, no gaze preference or deviation, no nystagmus    CN V normal sensation in V1, V2, and V3 segments bilaterally    CN VII no asymmetry, no nasolabial fold flattening. Intermittent chronic L facial twitching.   CN VIII normal hearing to speech    CN IX & X normal palatal elevation, no uvular deviation    CN XI 5/5 head turn and 5/5 shoulder shrug bilaterally    CN XII midline tongue protrusion    Motor:  Muscle bulk: normal, tone normal, pronator drift none tremor none Mvmt Root Nerve   Muscle Right Left Comments  SA C5/6 Ax Deltoid 5 5   EF C5/6 Mc Biceps 5 5   EE C6/7/8 Rad Triceps 5 5   WF C6/7 Med FCR     WE C7/8 PIN ECU     F Ab C8/T1 U ADM/FDI 5 5   HF L1/2/3 Fem Illopsoas 5 5   KE L2/3/4 Fem Quad 5 5   DF L4/5 D Peron Tib Ant 5 5   PF S1/2 Tibial Grc/Sol 5 5    Sensation:  Light touch Intact throughout   Pin prick    Temperature    Vibration   Proprioception    Coordination/Complex Motor:  - Finger to Nose intact BL - Heel to shin intact BL - Rapid alternating movement are normal - Gait: deferred  Medications  Current Facility-Administered Medications:    acetaminophen (TYLENOL) tablet 650 mg, 650 mg, Oral, Q6H PRN **OR** acetaminophen (TYLENOL) suppository 650 mg, 650 mg, Rectal, Q6H PRN, Rolly Salter, MD   ALPRAZolam Prudy Feeler) tablet 0.5 mg, 0.5 mg, Oral, BID PRN, Rolly Salter, MD   clotrimazole (LOTRIMIN) 1 % cream, , Topical, BID, Rolly Salter, MD, Given at 05/03/23 0912   diltiazem (CARDIZEM CD) 24 hr capsule 300 mg, 300 mg, Oral, Daily, Levie Heritage, DO, 300 mg at 05/03/23 0908   enoxaparin (LOVENOX) injection 40 mg, 40 mg, Subcutaneous,  Q24H, Levie Heritage, DO, 40 mg at 05/02/23 2212   feeding supplement (ENSURE ENLIVE / ENSURE PLUS) liquid 237 mL, 237 mL, Oral, TID BM, Rolly Salter, MD, 237 mL at 05/03/23 1914   hydrocortisone cream 1 % 1 Application, 1 Application, Topical, TID PRN, Rolly Salter, MD, 1 Application at 05/02/23 2355   levETIRAcetam (KEPPRA) tablet 1,000 mg, 1,000 mg, Oral, BID, Milon Dikes, MD, 1,000 mg at 05/03/23 0908   melatonin tablet 3 mg, 3 mg, Oral, QHS PRN, Stinson, Jacob J, DO   ondansetron (ZOFRAN) tablet 4 mg, 4 mg, Oral, Q6H PRN **OR** ondansetron (ZOFRAN) injection 4 mg, 4 mg, Intravenous, Q6H PRN, Rolly Salter, MD   Oxcarbazepine (TRILEPTAL) tablet 300 mg, 300 mg, Oral, Daily, Levie Heritage, DO, 300 mg at 05/03/23 0908   pantoprazole (PROTONIX) EC tablet 40 mg, 40 mg, Oral, QAC breakfast,  Levie Heritage, DO, 40 mg at 05/03/23 7829   polyethylene glycol (MIRALAX / GLYCOLAX) packet 17 g, 17 g, Oral, Daily PRN, Levie Heritage, DO   pregabalin (LYRICA) capsule 200 mg, 200 mg, Oral, TID, Levie Heritage, DO, 200 mg at 05/03/23 0909  Labs and Diagnostic Imaging   CBC:  Recent Labs  Lab 04/28/23 1449 05/01/23 1540 05/01/23 1540 05/02/23 0900 05/03/23 0730  WBC 5.3 5.4   < > 5.0 4.3  NEUTROABS 3.2 4.4  --   --   --   HGB 15.0 14.8   < > 14.2 13.8  HCT 44.0 42.2   < > 41.7 39.6  MCV 92 90.2   < > 91.6 91.0  PLT 136* 123*   < > 104* 94*   < > = values in this interval not displayed.    Basic Metabolic Panel:  Lab Results  Component Value Date   NA 141 05/03/2023   K 3.4 (L) 05/03/2023   CO2 27 05/03/2023   GLUCOSE 84 05/03/2023   BUN 17 05/03/2023   CREATININE 1.06 05/03/2023   CALCIUM 8.6 (L) 05/03/2023   GFRNONAA >60 05/03/2023   GFRAA 82 09/06/2019   Lipid Panel:  Lab Results  Component Value Date   LDLCALC 76 09/05/2022   HgbA1c:  Lab Results  Component Value Date   HGBA1C 4.5 (L) 07/26/2019   Urine Drug Screen:     Component Value Date/Time   LABOPIA NONE DETECTED 05/01/2023 2210   COCAINSCRNUR NONE DETECTED 05/01/2023 2210   LABBENZ NONE DETECTED 05/01/2023 2210   AMPHETMU NONE DETECTED 05/01/2023 2210   THCU POSITIVE (A) 05/01/2023 2210   LABBARB NONE DETECTED 05/01/2023 2210    Alcohol Level     Component Value Date/Time   ETH <10 05/01/2023 1540   INR  Lab Results  Component Value Date   INR 1.1 (H) 04/11/2022   APTT  Lab Results  Component Value Date   APTT 38 (H) 07/27/2019   AED levels: No results found for: "PHENYTOIN", "ZONISAMIDE", "LAMOTRIGINE", "LEVETIRACETA"  CT Head without contrast(Personally reviewed): No acute abnormality  LTM EEG 4/5:  This study showed two seizures as described above arising from  left fronto-parietal region, on  05/02/2023 at 0210 and 0338, lasting about 9 minutes and 17 minutes.     Additionally there is cortical dysfunction arising from left fronto-parietal region likely secondary to underlying structural abnormality, post-ictal state.   LTM EEG 4/6:  This study is suggestive of cortical dysfunction arising from left fronto-parietal region likely secondary to underlying structural abnormality, post-ictal state. No seizures were noted.  Assessment   Earl [name] is a 63 y.o. male with past history of B12 deficiency, hypothyroidism, alcohol abuse, cirrhosis, seizures on Keppra, peripheral neuropathy and chronic hypertension presents for evaluation of altered mental status and concern for staring spells. Has had altered mental status and feeling of fogginess when his Lyrica doses have been changed in the past. He was put on LTM EEG and had 2 seizures. Concern for breakthrough seizures in the setting of changing Lyrica doses versus withdrawal seizures.  Recommendations  Seizures precautions  Discontinue LTM EEG. Continue Keppra 1000mg  BID, Continue oxcarbazepine 300mg  and lyrica 200mg  [facility] will signoff. No driving for 6 months. Has to be seizure free before he can resume driving. Full seizure precautions listed below. ______________________________________________________________________  Erick Blinks, MD [facility] [phone]   If 7pm to 7am, please call on call as listed on AMION.  Seizure precautions: Per [facility] statutes, patients with seizures are not allowed to drive until they have been seizure-free for six months and cleared by a physician    Use caution when using heavy equipment or power tools. Avoid working on ladders or at heights. Take showers instead of baths. Ensure the water temperature is not too high on the home water heater. Do not go swimming alone. Do not lock yourself in a room alone (i.e. bathroom). When caring for infants or small children, sit down when holding, feeding, or changing them to minimize  risk of injury to the child in the event you have a seizure. Maintain good sleep hygiene. Avoid alcohol.    If patient has another seizure, call 911 and bring them back to the ED if: A.  The seizure lasts longer than 5 minutes.      B.  The patient doesn't wake shortly after the seizure or has new problems such as difficulty seeing, speaking or moving following the seizure C.  The patient was injured during the seizure D.  The patient has a temperature over 102 F (39C) E.  The patient vomited during the seizure and now is having trouble breathing    During the Seizure   - First, ensure adequate ventilation and place patients on the floor on their left side  Loosen clothing around the neck and ensure the airway is patent. If the patient is clenching the teeth, do not force the mouth open with any object as this can cause severe damage - Remove all items from the surrounding that can be hazardous. The patient may be oblivious to what's happening and may not even know what he or she is doing. If the patient is confused and wandering, either gently guide him/her away and block access to outside areas - Reassure the individual and be comforting - Call 911. In most cases, the seizure ends before EMS arrives. However, there are cases when seizures may last over 3 to 5 minutes. Or the individual may have developed breathing difficulties or severe injuries. If a pregnant patient or a person with diabetes develops a seizure, it is prudent to call an ambulance. - Finally, if the patient does not regain full consciousness, then call EMS. Most patients will remain confused for about 45 to 90 minutes after a seizure, so you must use judgment in calling for help. - Avoid restraints but make sure the patient is in a bed with padded side rails - Place the individual in a lateral position with the neck slightly flexed; this will help the saliva drain from the mouth and prevent  the tongue from falling backward -  Remove all nearby furniture and other hazards from the area - Provide verbal assurance as the individual is regaining consciousness - Provide the patient with privacy if possible - Call for help and start treatment as ordered by the caregiver    After the Seizure (Postictal Stage)   After a seizure, most patients experience confusion, fatigue, muscle pain and/or a headache. Thus, one should permit the individual to sleep. For the next few days, reassurance is essential. Being calm and helping reorient the person is also of importance.   Most seizures are painless and end spontaneously. Seizures are not harmful to others but can lead to complications such as stress on the lungs, brain and the heart. Individuals with prior lung problems may develop labored breathing and respiratory distress.

## 2023-05-03 NOTE — Discharge Summary (Signed)
 Physician Discharge Summary   Patient: Earl [name] MRN: [id number] DOB: Jun 14, 1960  Admit date:     05/01/2023  Discharge date: 05/03/2023  Discharge Physician: Lynden Oxford  PCP: [name], FNP  Recommendations at discharge: Follow-up with PCP in 1 week. Follow-up with neurology as recommended.   Follow-up Information     [name], FNP. Schedule an appointment as soon as possible for a visit in 1 week(s).   Specialty: Family Medicine Contact information: [address] [place] [address] [phone]         Asa Lente, MD. Schedule an appointment as soon as possible for a visit in 1 month(s).   Specialty: Neurology Contact information: [address] 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 Presented to the hospital as he started to taper himself off of the Lyrica from his 600 mg daily dose to 400 mg daily dose. On the day of the arrival he was unresponsive with staring spells. Initially presented to [facility].  Was transferred to [facility].  LTM EEG was started after neurology consultation. Assessment and Plan: Seizures. LTM EEG concerning for seizures. Neurology consult appreciated. On Keppra.  Dose increased. Continue Lyrica. Monitor.  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 Patient is chronically on Lyrica 600 mg. Patient was trying to self taper. Neurology recommended to continue the same home dose right  now.  Anxiety and depression. On Trileptal, Xanax.  Continue the same for now.  Palpitation. Sinus bradycardia On Cardizem. Given sinus bradycardia on telemetry we will reduce the dose from 300-40.  Tinea cruris. Was actually prescribed terbinafine but given his history of cirrhosis I would recommend not to use the medication. Will use topical clotrimazole.  GERD. Esophageal varices. Continue PPI. Due to sinus bradycardia we will discontinue beta-blocker.  Liver cirrhosis. History of alcohol abuse (currently does not drink alcohol. No evidence of asterixis. Outpatient follow-up with GI as already scheduled. Continue PPI.  Consultants:  Neurology  Procedures performed:  EEG LTM  DISCHARGE MEDICATION: Allergies as of 05/03/2023       Reactions   Amitriptyline Anaphylaxis   Amoxicillin Anaphylaxis, Rash   Asa [aspirin] Anaphylaxis   Penicillin G Anaphylaxis   Almecillin    Clonidine Derivatives Other (See Comments)   Severe Dry Mouth   Hydrochlorothiazide    Caused rectal bleeding and hematuria   Levothyroxine Nausea And Vomiting   Patient states that the 75 mcg is too strong for him.   Trintellix [vortioxetine] Nausea And Vomiting        Medication List     STOP taking these medications    furosemide 20 MG tablet Commonly known as: LASIX   metoprolol tartrate 25 MG tablet Commonly known as: LOPRESSOR   terbinafine 250 MG tablet Commonly known as: LAMISIL       TAKE these medications    ALPRAZolam 1 MG tablet Commonly known as: XANAX TAKE 1 TABLET BY MOUTH DAILY  AS NEEDED FOR ANXIETY. Must last 30 DAYS.   clotrimazole 1 % cream Commonly known as: LOTRIMIN Apply topically 2 (two) times daily.   cyanocobalamin 1000 MCG/ML injection Commonly known as: VITAMIN B12 Inject 1 mL (1,000 mcg total) into the muscle every 30 (thirty) days.   diltiazem 240 MG 24 hr capsule Commonly known as: CARDIZEM CD Take 1 capsule (240 mg total) by mouth  daily. What changed:  medication strength how much to take   feeding supplement Liqd Take 237 mLs by mouth 3 (three) times daily between meals.   levETIRAcetam 1000 MG tablet Commonly known as: KEPPRA Take 1 tablet (1,000 mg total) by mouth 2 (two) times daily. What changed:  medication strength how much to take   melatonin 3 MG Tabs tablet Take 3 mg by mouth at bedtime as needed (sleep).   ondansetron 4 MG tablet Commonly known as: ZOFRAN TAKE 1 TABLET BY MOUTH TWICE DAILY AS NEEDED FOR NAUSEA OR FOR VOMITING   Oxcarbazepine 300 MG tablet Commonly known as: Trileptal Take 1 tablet (300 mg total) by mouth 2 (two) times daily. What changed: when to take this   pantoprazole 40 MG tablet Commonly known as: PROTONIX Take 1 tablet (40 mg total) by mouth daily before breakfast.   pregabalin 200 MG capsule Commonly known as: LYRICA Take 1 capsule (200 mg total) by mouth in the morning, at noon, and at bedtime. Take 3 capsules (600 mg) po daily.    Prescription must last 30 or more days. What changed:  how much to take when to take this       Disposition: Home Diet recommendation: Regular diet  Discharge Exam: Vitals:   05/02/23 2352 05/03/23 0436 05/03/23 0803 05/03/23 1142  BP: (!) 100/56 94/69 110/72 109/64  Pulse: (!) 58 (!) 59 (!) 44 (!) 49  Resp: 16 16 17 17   Temp: (!) 97.5 F (36.4 C) (!) 97.5 F (36.4 C) 97.7 F (36.5 C) 98.2 F (36.8 C)  TempSrc: Oral Oral Oral Oral  SpO2: 98% 99% 99% 99%   General: Appear in no distress; no visible Abnormal Neck Mass Or lumps, Conjunctiva normal Cardiovascular: S1 and S2 Present, no Murmur, Respiratory: good respiratory effort, Bilateral Air entry present and CTA, no Crackles, no wheezes Abdomen: Bowel Sound present, Non tender  Extremities: no Pedal edema Neurology: alert and oriented to time, place, and person unchanged left facial tics There were no vitals filed for this visit. Condition at discharge:  stable  The results of significant diagnostics from this hospitalization (including imaging, microbiology, ancillary and laboratory) are listed below for reference.   Imaging Studies: Overnight EEG with video Result Date: 05/02/2023 Charlsie Quest, MD     05/03/2023  8:13 AM Patient Name: Earl [name] MRN: [id number] Epilepsy Attending: Charlsie Quest Referring Physician/Provider: Milon Dikes, MD Duration: 05/02/2023 0210 to 05/03/2023 0210 Patient history: 64 y.o. male with past history of B12 deficiency, hypothyroidism, alcohol abuse, cirrhosis, seizures on Keppra, peripheral neuropathy and chronic hypertension presents for evaluation of altered mental status and concern for staring spells. EEG to evaluate for seizure Level of alertness: Awake, asleep AEDs during EEG study: LEV, OXC, PGB Technical aspects: This EEG study was done with scalp electrodes positioned according to the 10-20 International system of electrode placement. Electrical activity was reviewed with band pass filter of 1-70Hz , sensitivity of 7 uV/mm, display speed of 52mm/sec with a 60Hz  notched filter applied as appropriate. EEG data were recorded continuously and digitally stored.  Video  monitoring was available and reviewed as appropriate. Description: The posterior dominant rhythm consists of 9 Hz activity of moderate voltage (25-35 uV) seen predominantly in posterior head regions, symmetric and reactive to eye opening and eye closing. Sleep was characterized by vertex waves, sleep spindles (12 to 14 Hz), maximal frontocentral region. EEG showed near continuous, at times rhythmic sharply contoured 3 to 6 Hz theta-delta slowing in left fronto-parietal region with waxing and waning morphology Seizure was noted on 05/02/2023 at 0210 arising from left fronto-parietal region. During seizure, no clinical signs were noted. EEG showed sharply contoured 5-6Hz  theta slowing in left fronto-parietal region admixed with sharp waves which gradually  evolved into 2-3hz  delta slowing and involved all of left hemisphere. Duration of seizure was about 9 minutes ( of note eeg was connected at 0210 and patient was already seizing at the time so duration is likely an underestimate) Seizure was noted on 05/02/2023 at 0338 arising from left fronto-parietal region. During the seizure, patient was noted to be moving around in bed, fumbling with objects. EEG showed sharply contoured 5-6Hz  theta slowing in left fronto-parietal region admixed with sharp waves which gradually evolved into 2-3hz  delta slowing and involved all of left hemisphere. Duration of seizure was about 17 minutes Hyperventilation and photic stimulation were not performed.   ABNORMALITY - Focal seizure, left fronto-parietal region - Continuous slow,  left fronto-parietal region IMPRESSION: This study showed two seizures as described above arising from  left fronto-parietal region, on  05/02/2023 at 0210 and 0338, lasting about 9 minutes and 17 minutes. Additionally there is cortical dysfunction arising from left fronto-parietal region likely secondary to underlying structural abnormality, post-ictal state. Dr Wilford Corner was notified. Charlsie Quest   CT Head Wo Contrast Result Date: 05/01/2023 CLINICAL DATA:  Altered mental status EXAM: CT HEAD WITHOUT CONTRAST TECHNIQUE: Contiguous axial images were obtained from the base of the skull through the vertex without intravenous contrast. RADIATION DOSE REDUCTION: This exam was performed according to the departmental dose-optimization program which includes automated exposure control, adjustment of the mA and/or kV according to patient size and/or use of iterative reconstruction technique. COMPARISON:  CT head 11/25/2021. FINDINGS: Brain: No evidence of acute infarction, hemorrhage, hydrocephalus, extra-axial collection or mass lesion/mass effect. Vascular: No hyperdense vessel or unexpected calcification. Skull: Normal. Negative for fracture or focal lesion.  Sinuses/Orbits: No acute finding. Other: None. IMPRESSION: No acute intracranial abnormality. Electronically Signed   By: Darliss Cheney M.D.   On: 05/01/2023 18:02    Microbiology: Results for orders placed or performed during the hospital encounter of 07/26/19  SARS Coronavirus 2 by RT PCR (hospital order, performed in [facility] hospital lab) Nasopharyngeal Nasopharyngeal Swab     Status: None   Collection Time: 07/26/19  3:57 AM   Specimen: Nasopharyngeal Swab  Result Value Ref Range Status   SARS Coronavirus 2 NEGATIVE NEGATIVE Final    Comment: (NOTE) SARS-CoV-2 target nucleic acids are NOT DETECTED.  The SARS-CoV-2 RNA is generally detectable in upper and lower respiratory specimens during the acute phase of infection. The lowest concentration of SARS-CoV-2 viral copies this assay can detect is 250 copies / mL. A negative result does not preclude SARS-CoV-2 infection and should not be used as the sole basis for treatment or other patient management decisions.  A negative result may occur with improper specimen collection / handling, submission of specimen other than nasopharyngeal swab, presence of viral mutation(s) within the areas targeted by this assay, and inadequate number of viral copies (<250  copies / mL). A negative result must be combined with clinical observations, patient history, and epidemiological information.  Fact Sheet for Patients:   BoilerBrush.com.cy  Fact Sheet for Healthcare Providers: https://pope.com/  This test is not yet approved or  cleared by the [place] FDA and has been authorized for detection and/or diagnosis of SARS-CoV-2 by FDA under an Emergency Use Authorization (EUA).  This EUA will remain in effect (meaning this test can be used) for the duration of the COVID-19 declaration under Section 564(b)(1) of the Act, 21 U.S.C. section 360bbb-3(b)(1), unless the authorization is terminated  or revoked sooner.  Performed at [facility], [address] [place], [place] [address]   Blood culture (routine x 2)     Status: None   Collection Time: 07/26/19  4:14 AM   Specimen: BLOOD  Result Value Ref Range Status   Specimen Description BLOOD LEFT ANTECUBITAL  Final   Special Requests   Final    BOTTLES DRAWN AEROBIC AND ANAEROBIC Blood Culture adequate volume   Culture   Final    NO GROWTH 5 DAYS Performed at [facility], [address], [place], [place] [address]    Report Status 07/31/2019 FINAL  Final  Blood culture (routine x 2)     Status: None   Collection Time: 07/26/19  4:16 AM   Specimen: BLOOD LEFT HAND  Result Value Ref Range Status   Specimen Description BLOOD LEFT HAND  Final   Special Requests   Final    BOTTLES DRAWN AEROBIC AND ANAEROBIC Blood Culture adequate volume   Culture   Final    NO GROWTH 5 DAYS Performed at [facility], [address] [place], [place] [address]    Report Status 07/31/2019 FINAL  Final  Gram stain     Status: None   Collection Time: 07/26/19 11:25 AM   Specimen: Ascitic; Body Fluid  Result Value Ref Range Status   Specimen Description ASCITIC  Final   Special Requests NONE  Final   Gram Stain   Final    CYTOSPIN SMEAR NO ORGANISMS SEEN WBC PRESENT, PREDOMINANTLY MONONUCLEAR Performed at [facility], [address] [place], [place] [address]    Report Status 07/26/2019 FINAL  Final  Culture, body fluid-bottle     Status: None   Collection Time: 07/26/19 11:25 AM   Specimen: Ascitic  Result Value Ref Range Status   Specimen Description ASCITIC  Final   Special Requests 10CC BOTTLES DRAWN AEROBIC AND ANAEROBIC  Final   Culture   Final    NO GROWTH 5 DAYS Performed at [facility], [address] [place], [place] [address]    Report Status 07/31/2019 FINAL  Final   Labs: CBC: Recent Labs  Lab 04/28/23 1449 05/01/23 1540 05/02/23 0900 05/03/23 0730  WBC 5.3 5.4 5.0 4.3  NEUTROABS 3.2 4.4  --   --   HGB 15.0  14.8 14.2 13.8  HCT 44.0 42.2 41.7 39.6  MCV 92 90.2 91.6 91.0  PLT 136* 123* 104* 94*   Basic Metabolic Panel: Recent Labs  Lab 04/28/23 1449 05/01/23 1540 05/02/23 0900 05/03/23 0730  NA 141 140 141 141  K 4.0 3.3* 3.1* 3.4*  CL 108* 106 106 106  CO2 19* 23 24 27   GLUCOSE 84 112* 65* 84  BUN 13 21 16 17   CREATININE 0.87 0.85 1.01 1.06  CALCIUM 8.9 9.3 9.2 8.6*  MG  --  2.0 2.1  --   PHOS  --   --  3.6  --    Liver Function Tests: Recent Labs  Lab 04/28/23 1449 05/01/23 1540  AST 31 27  ALT 32 27  ALKPHOS 132* 94  BILITOT 0.8 1.3*  PROT 6.8 7.3  ALBUMIN 4.2 4.2   CBG: Recent Labs  Lab 05/02/23 2013 05/02/23 2353 05/03/23 0436 05/03/23 0804 05/03/23 1142  GLUCAP 118* 115* 85 85 108*    Discharge time spent: greater than 30 minutes.  Author: Lynden Oxford, MD  [facility] 05/03/2023

## 2023-05-03 NOTE — Discharge Instructions (Signed)
Per [place] DMV statutes, patients with seizures are not allowed to drive until  they have been seizure-free for six months. Use caution when using heavy equipment or power tools. Avoid working on ladders or at heights. Take showers instead of baths. Ensure the water temperature is not too high on the home water heater. Do not go swimming alone. When caring for infants or small children, sit down when holding, feeding, or changing them to minimize risk of injury to the child in the event you have a seizure.  Maintain good sleep hygiene. Avoid alcohol.  

## 2023-05-03 NOTE — Progress Notes (Signed)
 LTM maint complete - no skin breakdown seen. [facility] monitored, Event button test confirmed by [facility].

## 2023-05-03 NOTE — Procedures (Addendum)
 Patient Name: Earl [name]  MRN: [id number]  Epilepsy Attending: Charlsie Quest  Referring Physician/Provider: Milon Dikes, MD  Duration: 05/03/2023 0210 to 05/03/2023 1017   Patient history: 63 y.o. male with past history of B12 deficiency, hypothyroidism, alcohol abuse, cirrhosis, seizures on Keppra, peripheral neuropathy and chronic hypertension presents for evaluation of altered mental status and concern for staring spells. EEG to evaluate for seizure   Level of alertness: Awake, asleep   AEDs during EEG study: LEV, OXC, PGB   Technical aspects: This EEG study was done with scalp electrodes positioned according to the 10-20 International system of electrode placement. Electrical activity was reviewed with band pass filter of 1-70Hz , sensitivity of 7 uV/mm, display speed of 29mm/sec with a 60Hz  notched filter applied as appropriate. EEG data were recorded continuously and digitally stored.  Video monitoring was available and reviewed as appropriate.   Description: The posterior dominant rhythm consists of 9 Hz activity of moderate voltage (25-35 uV) seen predominantly in posterior head regions, symmetric and reactive to eye opening and eye closing. Sleep was characterized by vertex waves, sleep spindles (12 to 14 Hz), maximal frontocentral region. EEG showed intermittent 3 to 6 Hz theta-delta slowing in left fronto-parietal region. Hyperventilation and photic stimulation were not performed.      ABNORMALITY - Intermittent slow,  left fronto-parietal region   IMPRESSION: This study is suggestive of cortical dysfunction arising from left fronto-parietal region likely secondary to underlying structural abnormality, post-ictal state. No seizures were noted.   Aikam Hellickson Annabelle Harman

## 2023-05-03 NOTE — Evaluation (Signed)
 Physical Therapy Evaluation Patient Details Name: Earl [name] MRN: [id number] DOB: 06-09-1960 Today's Date: 05/03/2023  History of Present Illness  Pt is a 64 yo male admitted to [facility] on 05/01/23 for seizure episodes. PMH of anemia, hypothyroidism, alcohol abuse, cirrhosis, seizures on keppra, HTN, peripheral neuropathy.  Clinical Impression  Pt is currently at baseline level of functioning. Pt scored 21/24 on the DGI and is Mod I for bed mobility, Ind for sit to stand and Mod I for gait without an AD. Pt reports he is moving about the same as he was prior to hospitalization. Pt was educated on importance of checking his feet at bedtime in order to prevent injury/infection due to neuropathy; initially pt stated it was okay because he does not have circulation issues. Pt was educated that is not the only reason for checking his feet. Currently pt is presenting at baseline level of functioning and no skilled physical therapy services recommended. Pt will be discharged from skilled physical therapy services at this time; please re-consult if further needs arise.            If plan is discharge home, recommend the following: Other (comment) (as needed)     Equipment Recommendations None recommended by PT     Functional Status Assessment Patient has not had a recent decline in their functional status     Precautions / Restrictions Precautions Precautions: Fall Recall of Precautions/Restrictions: Intact Restrictions Weight Bearing Restrictions Per Provider Order: No      Mobility  Bed Mobility Overal bed mobility: Modified Independent       General bed mobility comments: Pt reports he sleeps in a recliner at home just becuase he likes to. HOB elevated Md I with slight increase in time.    Transfers Overall transfer level: Independent Equipment used: None    Ambulation/Gait Ambulation/Gait assistance: Modified independent (Device/Increase time) Gait Distance (Feet): 200  Feet Assistive device: None Gait Pattern/deviations: Wide base of support, Step-through pattern, Decreased step length - left, Decreased step length - right Gait velocity: decreased Gait velocity interpretation: 1.31 - 2.62 ft/sec, indicative of limited community ambulator   General Gait Details: lateral sway with gait no overt LOB  Stairs Stairs: Yes Stairs assistance: Supervision Stair Management: No rails, Alternating pattern, Forwards Number of Stairs: 2       Balance Overall balance assessment: Modified Independent     Standardized Balance Assessment Standardized Balance Assessment : Dynamic Gait Index   Dynamic Gait Index Level Surface: Normal Change in Gait Speed: Mild Impairment Gait with Horizontal Head Turns: Moderate Impairment Gait with Vertical Head Turns: Normal Gait and Pivot Turn: Normal Step Over Obstacle: Normal Step Around Obstacles: Normal Steps: Normal Total Score: 21       Pertinent Vitals/Pain Pain Assessment Pain Score: 5  Pain Location: chronic back Pain Descriptors / Indicators: Aching Pain Intervention(s): Monitored during session, Limited activity within patient's tolerance    Home Living Family/patient expects to be discharged to:: Private residence Living Arrangements: Alone Available Help at Discharge: Available PRN/intermittently;Family (sister can help intermittently) Type of Home: House Home Access: Level entry       Home Layout: One level Home Equipment: None      Prior Function Prior Level of Function : Independent/Modified Independent             Mobility Comments: Ind no AD ADLs Comments: Ind; gets meals and groceiries sent to him via apps.     Extremity/Trunk Assessment   Upper Extremity Assessment Upper Extremity  Assessment: Defer to OT evaluation    Lower Extremity Assessment Lower Extremity Assessment: Overall WFL for tasks assessed;LLE deficits/detail;RLE deficits/detail RLE Sensation: history of  peripheral neuropathy LLE Sensation: history of peripheral neuropathy    Cervical / Trunk Assessment Cervical / Trunk Assessment: Normal  Communication   Communication Communication: No apparent difficulties    Cognition Arousal: Alert Behavior During Therapy: WFL for tasks assessed/performed   PT - Cognitive impairments: Orientation   Orientation impairments: Place, Time, Person, Situation     Following commands: Intact       Cueing Cueing Techniques: Verbal cues     General Comments General comments (skin integrity, edema, etc.): No noted skin issues out side of gown. No signs/symptoms of cardiac/respiratory distress throughout session        Assessment/Plan    PT Assessment Patient does not need any further PT services         PT Goals (Current goals can be found in the Care Plan section)  Acute Rehab PT Goals PT Goal Formulation: All assessment and education complete, DC therapy            AM-PAC PT "6 Clicks" Mobility  Outcome Measure Help needed turning from your back to your side while in a flat bed without using bedrails?: None Help needed moving from lying on your back to sitting on the side of a flat bed without using bedrails?: None Help needed moving to and from a bed to a chair (including a wheelchair)?: None Help needed standing up from a chair using your arms (e.g., wheelchair or bedside chair)?: None Help needed to walk in hospital room?: None Help needed climbing 3-5 steps with a railing? : None 6 Click Score: 24    End of Session Equipment Utilized During Treatment: Gait belt Activity Tolerance: Patient tolerated treatment well Patient left: in bed;with call bell/phone within reach;with bed alarm set Nurse Communication: Mobility status      Time: [id number] PT Time Calculation (min) (ACUTE ONLY): 16 min   Charges:       PT General Charges $$ ACUTE PT VISIT: 1 Visit        Harrel Carina, DPT, CLT  Acute Rehabilitation  Services Office: [phone] (Secure chat preferred)   Claudia Desanctis 05/03/2023, 11:34 AM

## 2023-05-04 ENCOUNTER — Telehealth: Payer: Self-pay

## 2023-05-04 NOTE — Transitions of Care (Post Inpatient/ED Visit) (Signed)
 05/04/2023  Name: Earl [name] MRN: [id number] DOB: [birth year]  Today's TOC FU Call Status: Today's TOC FU Call Status:: Successful TOC FU Call Completed TOC FU Call Complete Date: 05/04/23 Patient's Name and Date of Birth confirmed.  Transition Care Management Follow-up Telephone Call Date of Discharge: 05/03/23 Discharge Facility: [facility]) Type of Discharge: Inpatient Admission Primary Inpatient Discharge Diagnosis:: Seizures How have you been since you were released from the hospital?: Same Any questions or concerns?: Yes Patient Questions/Concerns:: Patient unsatisfied with care previously received and plans to find a new primary care provider Patient Questions/Concerns Addressed: Provided Patient Educational Materials, Notified Provider of Patient Questions/Concerns  Items Reviewed: Did you receive and understand the discharge instructions provided?: Yes Medications obtained,verified, and reconciled?: Yes (Medications Reviewed) Any new allergies since your discharge?: No Dietary orders reviewed?: NA Do you have support at home?: No  Medications Reviewed Today: Medications Reviewed Today     Reviewed by Anthoney Harada, LPN (Licensed Practical Nurse) on 05/04/23 at [phone]  Med List Status: <None>   Medication Order Taking? Sig Documenting Provider Last Dose Status Informant  ALPRAZolam (XANAX) 1 MG tablet [id number] Yes TAKE 1 TABLET BY MOUTH DAILY AS NEEDED FOR ANXIETY. Must last 30 DAYS. Sater, Pearletha Furl, MD Taking Active   clotrimazole (LOTRIMIN) 1 % cream [id number] Yes Apply topically 2 (two) times daily. Rolly Salter, MD Taking Active   cyanocobalamin (VITAMIN B12) 1000 MCG/ML injection [id number] Yes Inject 1 mL (1,000 mcg total) into the muscle every 30 (thirty) days. Sater, Pearletha Furl, MD Taking Active   diltiazem (CARDIZEM CD) 240 MG 24 hr capsule [id number] Yes Take 1 capsule (240 mg total) by mouth daily. Rolly Salter, MD Taking Active   feeding  supplement (ENSURE ENLIVE / ENSURE PLUS) LIQD [id number] Yes Take 237 mLs by mouth 3 (three) times daily between meals. Rolly Salter, MD Taking Active   levETIRAcetam (KEPPRA) 1000 MG tablet [id number] Yes Take 1 tablet (1,000 mg total) by mouth 2 (two) times daily. Rolly Salter, MD Taking Active   melatonin 3 MG TABS tablet [id number] Yes Take 3 mg by mouth at bedtime as needed (sleep). [provider] Taking Active Self  ondansetron (ZOFRAN) 4 MG tablet [id number] Yes TAKE 1 TABLET BY MOUTH TWICE DAILY AS NEEDED FOR NAUSEA OR FOR VOMITING Rehman, Joline Maxcy, MD Taking Active Self           Med Note Lenoria Farrier   Tue May 06, 2022  9:43 AM)    Oxcarbazepine (TRILEPTAL) 300 MG tablet [id number] Yes Take 1 tablet (300 mg total) by mouth 2 (two) times daily.  Patient taking differently: Take 300 mg by mouth daily.   Sater, Pearletha Furl, MD Taking Active Self  pantoprazole (PROTONIX) 40 MG tablet [id number] Yes Take 1 tablet (40 mg total) by mouth daily before breakfast. [name], FNP Taking Active   pregabalin (LYRICA) 200 MG capsule [id number] Yes Take 1 capsule (200 mg total) by mouth in the morning, at noon, and at bedtime. Take 3 capsules (600 mg) po daily.    Prescription must last 30 or more days. Rolly Salter, MD Taking Active             Home Care and Equipment/Supplies: Were Home Health Services Ordered?: NA Any new equipment or medical supplies ordered?: NA  Functional Questionnaire: Do you need assistance with bathing/showering or dressing?: No Do you need assistance with meal preparation?: No Do you need assistance with eating?:  No Do you have difficulty maintaining continence: No Do you need assistance with getting out of bed/getting out of a chair/moving?: No Do you have difficulty managing or taking your medications?: No  Follow up appointments reviewed: PCP Follow-up appointment confirmed?: No MD Provider Line [phone] Given: Yes Specialist  Hospital Follow-up appointment confirmed?: No Reason Specialist Follow-Up Not Confirmed: Patient has Specialist Provider Number and will Call for Appointment Do you need transportation to your follow-up appointment?: No Do you understand care options if your condition(s) worsen?: Yes-patient verbalized understanding    SIGNATURE Kandis Fantasia, LPN [facility] Health Advisor [place] [facility] You Are. We Are. [facility] Direct Dial [phone]

## 2023-05-05 ENCOUNTER — Ambulatory Visit: Payer: MEDICAID | Admitting: Nurse Practitioner

## 2023-05-06 ENCOUNTER — Encounter (HOSPITAL_COMMUNITY): Payer: Self-pay | Admitting: Internal Medicine

## 2023-05-06 ENCOUNTER — Other Ambulatory Visit: Payer: Self-pay

## 2023-05-06 ENCOUNTER — Encounter (HOSPITAL_COMMUNITY): Payer: Self-pay

## 2023-05-06 ENCOUNTER — Emergency Department (HOSPITAL_COMMUNITY)
Admission: EM | Admit: 2023-05-06 | Discharge: 2023-05-06 | Disposition: A | Discharge status: Home / Self Care | Payer: MEDICAID | Source: Home / Self Care

## 2023-05-06 ENCOUNTER — Inpatient Hospital Stay (HOSPITAL_COMMUNITY)
Admission: AD | Admit: 2023-05-06 | Discharge: 2023-05-08 | DRG: 101 | Disposition: A | Discharge status: Home / Self Care | Payer: MEDICAID | Source: Other Acute Inpatient Hospital | Attending: Family Medicine | Admitting: Family Medicine

## 2023-05-06 DIAGNOSIS — R569 Unspecified convulsions: Secondary | ICD-10-CM

## 2023-05-06 DIAGNOSIS — G40909 Epilepsy, unspecified, not intractable, without status epilepticus: Secondary | ICD-10-CM | POA: Diagnosis not present

## 2023-05-06 DIAGNOSIS — G5793 Unspecified mononeuropathy of bilateral lower limbs: Secondary | ICD-10-CM | POA: Diagnosis not present

## 2023-05-06 DIAGNOSIS — Z885 Allergy status to narcotic agent status: Secondary | ICD-10-CM

## 2023-05-06 DIAGNOSIS — E782 Mixed hyperlipidemia: Secondary | ICD-10-CM | POA: Diagnosis present

## 2023-05-06 DIAGNOSIS — Z87891 Personal history of nicotine dependence: Secondary | ICD-10-CM

## 2023-05-06 DIAGNOSIS — I10 Essential (primary) hypertension: Secondary | ICD-10-CM | POA: Diagnosis present

## 2023-05-06 DIAGNOSIS — K219 Gastro-esophageal reflux disease without esophagitis: Secondary | ICD-10-CM | POA: Diagnosis present

## 2023-05-06 DIAGNOSIS — T426X5A Adverse effect of other antiepileptic and sedative-hypnotic drugs, initial encounter: Secondary | ICD-10-CM | POA: Diagnosis present

## 2023-05-06 DIAGNOSIS — B356 Tinea cruris: Secondary | ICD-10-CM | POA: Diagnosis present

## 2023-05-06 DIAGNOSIS — R002 Palpitations: Secondary | ICD-10-CM | POA: Diagnosis present

## 2023-05-06 DIAGNOSIS — F410 Panic disorder [episodic paroxysmal anxiety] without agoraphobia: Secondary | ICD-10-CM | POA: Diagnosis present

## 2023-05-06 DIAGNOSIS — R441 Visual hallucinations: Secondary | ICD-10-CM | POA: Diagnosis not present

## 2023-05-06 DIAGNOSIS — Z886 Allergy status to analgesic agent status: Secondary | ICD-10-CM

## 2023-05-06 DIAGNOSIS — I480 Paroxysmal atrial fibrillation: Secondary | ICD-10-CM | POA: Diagnosis not present

## 2023-05-06 DIAGNOSIS — K7030 Alcoholic cirrhosis of liver without ascites: Secondary | ICD-10-CM | POA: Diagnosis not present

## 2023-05-06 DIAGNOSIS — R4182 Altered mental status, unspecified: Secondary | ICD-10-CM | POA: Diagnosis present

## 2023-05-06 DIAGNOSIS — F1011 Alcohol abuse, in remission: Secondary | ICD-10-CM | POA: Diagnosis not present

## 2023-05-06 DIAGNOSIS — Z820 Family history of epilepsy and other diseases of the nervous system: Secondary | ICD-10-CM

## 2023-05-06 DIAGNOSIS — Z85038 Personal history of other malignant neoplasm of large intestine: Secondary | ICD-10-CM

## 2023-05-06 DIAGNOSIS — F32A Depression, unspecified: Secondary | ICD-10-CM | POA: Diagnosis not present

## 2023-05-06 DIAGNOSIS — Y92009 Unspecified place in unspecified non-institutional (private) residence as the place of occurrence of the external cause: Secondary | ICD-10-CM | POA: Diagnosis not present

## 2023-05-06 DIAGNOSIS — Z8349 Family history of other endocrine, nutritional and metabolic diseases: Secondary | ICD-10-CM

## 2023-05-06 DIAGNOSIS — R451 Restlessness and agitation: Secondary | ICD-10-CM

## 2023-05-06 DIAGNOSIS — E538 Deficiency of other specified B group vitamins: Secondary | ICD-10-CM | POA: Diagnosis not present

## 2023-05-06 DIAGNOSIS — G8929 Other chronic pain: Secondary | ICD-10-CM | POA: Diagnosis not present

## 2023-05-06 DIAGNOSIS — Z83438 Family history of other disorder of lipoprotein metabolism and other lipidemia: Secondary | ICD-10-CM

## 2023-05-06 DIAGNOSIS — Z79890 Hormone replacement therapy (postmenopausal): Secondary | ICD-10-CM

## 2023-05-06 DIAGNOSIS — Z880 Allergy status to penicillin: Secondary | ICD-10-CM

## 2023-05-06 DIAGNOSIS — E039 Hypothyroidism, unspecified: Secondary | ICD-10-CM | POA: Diagnosis not present

## 2023-05-06 DIAGNOSIS — Z8249 Family history of ischemic heart disease and other diseases of the circulatory system: Secondary | ICD-10-CM

## 2023-05-06 DIAGNOSIS — R001 Bradycardia, unspecified: Secondary | ICD-10-CM | POA: Diagnosis present

## 2023-05-06 DIAGNOSIS — Z888 Allergy status to other drugs, medicaments and biological substances status: Secondary | ICD-10-CM

## 2023-05-06 DIAGNOSIS — Z79899 Other long term (current) drug therapy: Secondary | ICD-10-CM | POA: Diagnosis not present

## 2023-05-06 DIAGNOSIS — Z87442 Personal history of urinary calculi: Secondary | ICD-10-CM

## 2023-05-06 LAB — VITAMIN B12: Vitamin B-12: 780 pg/mL (ref 180–914)

## 2023-05-06 MED ORDER — DIPHENHYDRAMINE HCL 50 MG/ML IJ SOLN: 50.0000 mg | Freq: Four times a day (QID) | INTRAMUSCULAR | Status: DC | PRN

## 2023-05-06 MED ORDER — SODIUM CHLORIDE 0.9% FLUSH
3.0000 mL | Freq: Two times a day (BID) | INTRAVENOUS | Status: DC
Start: 2023-05-06 — End: 2023-05-08
  Administered 2023-05-06 – 2023-05-07 (×2): 3 mL via INTRAVENOUS

## 2023-05-06 MED ORDER — HYDRALAZINE HCL 20 MG/ML IJ SOLN: 5.0000 mg | INTRAMUSCULAR | Status: DC | PRN

## 2023-05-06 MED ORDER — ALPRAZOLAM 0.5 MG PO TABS: 1.0000 mg | ORAL_TABLET | Freq: Two times a day (BID) | ORAL | Status: DC | PRN

## 2023-05-06 MED ORDER — PREGABALIN 100 MG PO CAPS
200.0000 mg | ORAL_CAPSULE | Freq: Three times a day (TID) | ORAL | Status: DC
  Administered 2023-05-06 – 2023-05-08 (×6): 200 mg via ORAL
  Filled 2023-05-06 (×6): qty 2

## 2023-05-06 MED ORDER — DILTIAZEM HCL ER COATED BEADS 240 MG PO CP24
240.0000 mg | ORAL_CAPSULE | Freq: Every day | ORAL | Status: DC
  Administered 2023-05-07 – 2023-05-08 (×2): 240 mg via ORAL
  Filled 2023-05-06 (×4): qty 1

## 2023-05-06 MED ORDER — ACETAMINOPHEN 325 MG PO TABS: 650.0000 mg | ORAL_TABLET | ORAL | Status: DC | PRN

## 2023-05-06 MED ORDER — DIPHENHYDRAMINE HCL 25 MG PO CAPS: 25.0000 mg | ORAL_CAPSULE | Freq: Four times a day (QID) | ORAL | Status: DC | PRN

## 2023-05-06 MED ORDER — ENOXAPARIN SODIUM 40 MG/0.4ML IJ SOSY
40.0000 mg | PREFILLED_SYRINGE | INTRAMUSCULAR | Status: DC
  Administered 2023-05-06 – 2023-05-07 (×2): 40 mg via SUBCUTANEOUS
  Filled 2023-05-06 (×2): qty 0.4

## 2023-05-06 MED ORDER — PANTOPRAZOLE SODIUM 40 MG PO TBEC
40.0000 mg | DELAYED_RELEASE_TABLET | Freq: Every day | ORAL | Status: DC
  Administered 2023-05-07: 40 mg via ORAL
  Filled 2023-05-06: qty 1

## 2023-05-06 MED ORDER — ACETAMINOPHEN 650 MG RE SUPP: 650.0000 mg | RECTAL | Status: DC | PRN

## 2023-05-06 MED ORDER — LEVETIRACETAM 500 MG PO TABS
1000.0000 mg | ORAL_TABLET | Freq: Two times a day (BID) | ORAL | Status: DC
  Administered 2023-05-06: 1000 mg via ORAL
  Filled 2023-05-06: qty 2

## 2023-05-06 MED ORDER — LORAZEPAM 2 MG/ML IJ SOLN: 4.0000 mg | INTRAMUSCULAR | Status: DC | PRN

## 2023-05-06 MED ORDER — ONDANSETRON HCL 4 MG PO TABS: 4.0000 mg | ORAL_TABLET | Freq: Four times a day (QID) | ORAL | Status: DC | PRN

## 2023-05-06 MED ORDER — PREGABALIN 100 MG PO CAPS: 200.0000 mg | ORAL_CAPSULE | Freq: Three times a day (TID) | ORAL | Status: DC

## 2023-05-06 MED ORDER — ONDANSETRON HCL 4 MG/2ML IJ SOLN: 4.0000 mg | Freq: Four times a day (QID) | INTRAMUSCULAR | Status: DC | PRN

## 2023-05-06 MED ORDER — OXCARBAZEPINE 300 MG PO TABS
300.0000 mg | ORAL_TABLET | Freq: Two times a day (BID) | ORAL | Status: DC
  Administered 2023-05-06 – 2023-05-07 (×2): 300 mg via ORAL
  Filled 2023-05-06 (×3): qty 1

## 2023-05-06 NOTE — Consult Note (Signed)
 NEUROLOGY CONSULT NOTE   Date of service: May 06, 2023 Patient Name: Earl [name] MRN:  [id number] DOB:  1960-03-26 Chief Complaint: "hallucinations" Requesting Provider: Jonah Blue, MD  History of Present Illness  Earl [name] is a 63 y.o. male with hx of seizures, neuropathy, HTN, hypothyroidism, B12 deficiency, EtOH abuse, cirrhosis, anxiety/depression, colon cancer who presents as an IVC admit from [facility] after presenting there with diagnosed visit visual hallucinations/psychosis.  Patient reported that a fox was biting his legs/pulling on his pant leg and he came to the hospital to get a rabies shot.  Neurology was consulted for possible seizures and altered mental status.  On exam, patient is sitting up in chair talking to his sister on the phone.  He reiterates his story about a fox pulling at his left pant leg today on his property he said that the [place] did not completely bite him but he was afraid he might of nicked him some and wanted to go to the hospital and see if he needed a rabies shot today.  He was able to give me clear and coherent history of his recent seizures and his medications.  Oriented to place, time, age able to do serial additions etc.  He has a chronic left facial twitch that is known.  Moves all extremities.  Denies any sensory deficit, except for known bilateral leg neuropathy that extends up to the front of his thighs.  Patient was just discharged from [facility] 3 days ago where he was admitted due to altered mental status and feeling of fogginess.  He was placed on LTM EEG during this admission and 2 seizures were seen.  His Keppra dosage was increased to 1000 twice daily.  Patient confirms that he has been taking his increased dosage and is compliant with all his medications.  Current AED/Neurological Medications:  Keppra 1000mg  BID Lyrica 600mg  daily (200mg  am, noon, hs) Xanax 1mg  PRN    ROS  Comprehensive ROS performed and pertinent  positives documented in HPI   Past History   Past Medical History:  Diagnosis Date   Alcohol abuse    Anemia    Anxiety    Arthritis    Colon cancer (HCC)    Depression    Dysrhythmia    Gall stones    Heart burn    History of kidney stones    Hyperlipidemia    Hypertension    Hypothyroidism    Liver disease    Pernicious anemia    Tachycardia    Typical atrial flutter (HCC)    Vitamin B12 deficiency     Past Surgical History:  Procedure Laterality Date   A-FLUTTER ABLATION N/A 07/31/2016   Procedure: A-Flutter Ablation;  Surgeon: Hillis Range, MD;  Location: [facility] INVASIVE CV LAB;  Service: Cardiovascular;  Laterality: N/A;   BIOPSY  05/08/2022   Procedure: BIOPSY;  Surgeon: Lynann Bologna, MD;  Location: [facility] ENDOSCOPY;  Service: Gastroenterology;;   COLONOSCOPY N/A 04/17/2016   Procedure: COLONOSCOPY;  Surgeon: Malissa Hippo, MD;  Location: [facility] ENDO SUITE;  Service: Endoscopy;  Laterality: N/A;  1200   COLONOSCOPY N/A 05/07/2017   Procedure: COLONOSCOPY;  Surgeon: Malissa Hippo, MD;  Location: [facility] END[facility];  Service: Endoscopy;  Laterality: N/A;  200   COLONOSCOPY WITH PROPOFOL N/A 07/25/2020   Procedure: COLONOSCOPY WITH PROPOFOL;  Surgeon: Malissa Hippo, MD;  Location: [facility];  Service: Endoscopy;  Laterality: N/A;  12:10   ESOPHAGEAL BANDING  07/25/2020  Procedure: ESOPHAGEAL BANDING;  Surgeon: Malissa Hippo, MD;  Location: [facility];  Service: Endoscopy;;   ESOPHAGOGASTRODUODENOSCOPY (EGD) WITH PROPOFOL N/A 07/25/2020   Procedure: ESOPHAGOGASTRODUODENOSCOPY (EGD) WITH PROPOFOL;  Surgeon: Malissa Hippo, MD;  [facility];  Service: Endoscopy;  Laterality: N/A;   ESOPHAGOGASTRODUODENOSCOPY (EGD) WITH PROPOFOL N/A 04/11/2021   Procedure: ESOPHAGOGASTRODUODENOSCOPY (EGD) WITH PROPOFOL;  Surgeon: Malissa Hippo, MD;  [facility];  Service: Endoscopy;  Laterality: N/A;  1:00   ESOPHAGOGASTRODUODENOSCOPY (EGD) WITH PROPOFOL N/A  05/08/2022   Procedure: ESOPHAGOGASTRODUODENOSCOPY (EGD) WITH PROPOFOL;  Surgeon: Lynann Bologna, MD;  Location: [facility] ENDOS[facility];  Service: Gastroenterology;  Laterality: N/A;   implantable loop recorder placement  01/24/2019    Medtronic Reveal Linq model LNQ 11 implantable loop recorder (SN [id number] ) by Dr Johney Frame for evaluation of palpitations   MINOR CARPAL TUNNEL     right   POLYPECTOMY  04/17/2016   Procedure: POLYPECTOMY;  Surgeon: Malissa Hippo, MD;  [facility];  Service: Endoscopy;;  sigmoid   POLYPECTOMY  07/25/2020   Procedure: POLYPECTOMY;  Surgeon: Malissa Hippo, MD;  [facility];  Service: Endoscopy;;    Family History: Family History  Problem Relation Age of Onset   Alzheimer's disease Mother    Hyperlipidemia Mother    Gallbladder disease Mother    Heart disease Father    Coronary artery disease Father    Liver disease Father    Thyroid disease Father    Hyperlipidemia Father    Heart attack Father    Cirrhosis Father    Gallbladder disease Sister    Coronary artery disease Paternal Grandfather     Social History  reports that he quit smoking about 44 years ago. His smoking use included cigarettes. He has never used smokeless tobacco. He reports that he does not currently use alcohol after a past usage of about 8.0 standard drinks of alcohol per week. He reports that he does not use drugs.  Allergies  Allergen Reactions   Amitriptyline Anaphylaxis   Amoxicillin Anaphylaxis and Rash   Asa [Aspirin] Anaphylaxis   Penicillin G Anaphylaxis   Almecillin    Clonidine Derivatives Other (See Comments)    Severe Dry Mouth   Hydrochlorothiazide     Caused rectal bleeding and hematuria   Levothyroxine Nausea And Vomiting    Patient states that the 75 mcg is too strong for him.   Trintellix [Vortioxetine] Nausea And Vomiting    Medications   Current Facility-Administered Medications:    acetaminophen (TYLENOL) tablet 650 mg, 650 mg,  Oral, Q4H PRN **OR** acetaminophen (TYLENOL) suppository 650 mg, 650 mg, Rectal, Q4H PRN, Jonah Blue, MD   ALPRAZolam Prudy Feeler) tablet 1 mg, 1 mg, Oral, BID PRN, Jonah Blue, MD   diltiazem (CARDIZEM CD) 24 hr capsule 240 mg, 240 mg, Oral, Daily, Jonah Blue, MD   diphenhydrAMINE (BENADRYL) capsule 25 mg, 25 mg, Oral, Q6H PRN **OR** diphenhydrAMINE (BENADRYL) injection 50 mg, 50 mg, Intramuscular, Q6H PRN, Starkes-Perry, Juel Burrow, FNP   enoxaparin (LOVENOX) injection 40 mg, 40 mg, Subcutaneous, Q24H, Jonah Blue, MD   hydrALAZINE (APRESOLINE) injection 5 mg, 5 mg, Intravenous, Q4H PRN, Jonah Blue, MD   levETIRAcetam (KEPPRA) tablet 1,000 mg, 1,000 mg, Oral, BID, Jonah Blue, MD   LORazepam (ATIVAN) injection 4 mg, 4 mg, Intravenous, Q5 Min x 2 PRN, Jonah Blue, MD   ondansetron [facility]) tablet 4 mg, 4 mg, Oral, Q6H PRN **OR** ondansetron (ZOFRAN) injection 4 mg, 4  mg, Intravenous, Q6H PRN, Jonah Blue, MD   Oxcarbazepine (TRILEPTAL) tablet 300 mg, 300 mg, Oral, BID, Jonah Blue, MD   Melene Muller ON 05/07/2023] pantoprazole (PROTONIX) EC tablet 40 mg, 40 mg, Oral, QAC breakfast, Jonah Blue, MD   pregabalin (LYRICA) capsule 200 mg, 200 mg, Oral, TID, Jonah Blue, MD   sodium chloride flush (NS) 0.9 % injection 3 mL, 3 mL, Intravenous, Q12H, Jonah Blue, MD  Vitals   Vitals:   05/06/23 1415 05/06/23 1502  BP: (S) (!) 155/102 97/67  Pulse: (S) (!) 56 62  Temp: (S) 98 F (36.7 C)   SpO2: 98%     There is no height or weight on file to calculate BMI.  Physical Exam   Constitutional: Appears well-developed and well-nourished.  Psych: Calm and cooperative.  Cardiovascular: S1S2 Respiratory: Effort normal, non-labored breathing.  Skin: Patient would not show left leg under his paper scrubs where he said the fox pulled at his pant leg to know if there was any laceration there.   Neurologic Examination   Patient is awake, alert, oriented to self,  place, age, month and year.  He is able to hold conversation, do serial additions, provide clear and coherent history. No dysarthria, aphasia or neglect.   He does have a chronic left facial twitch that is known, has worsened over the years per patient. Cranial nerves grossly intact otherwise. Moves all extremities spontaneously and to command. Denies sensory deficit, except for known neuropathy bilateral legs that extends to the front of thigh.  Labs/Imaging/Neurodiagnostic studies   CBC:  Recent Labs  Lab 05/14/23 1540 05/02/23 0900 05/03/23 0730  WBC 5.4 5.0 4.3  NEUTROABS 4.4  --   --   HGB 14.8 14.2 13.8  HCT 42.2 41.7 39.6  MCV 90.2 91.6 91.0  PLT 123* 104* 94*   Basic Metabolic Panel:  Lab Results  Component Value Date   NA 141 05/03/2023   K 3.4 (L) 05/03/2023   CO2 27 05/03/2023   GLUCOSE 84 05/03/2023   BUN 17 05/03/2023   CREATININE 1.06 05/03/2023   CALCIUM 8.6 (L) 05/03/2023   GFRNONAA >60 05/03/2023   GFRAA 82 09/06/2019   Lipid Panel:  Lab Results  Component Value Date   LDLCALC 76 09/05/2022   HgbA1c:  Lab Results  Component Value Date   HGBA1C 4.5 (L) 07/26/2019   Urine Drug Screen:     Component Value Date/Time   LABOPIA NONE DETECTED 2023/05/14 2210   COCAINSCRNUR NONE DETECTED 05/14/23 2210   LABBENZ NONE DETECTED 14-May-2023 2210   AMPHETMU NONE DETECTED 05/14/23 2210   THCU POSITIVE (A) 05/14/2023 2210   LABBARB NONE DETECTED 05/14/2023 2210    Alcohol Level     Component Value Date/Time   ETH <10 May 14, 2023 1540   INR  Lab Results  Component Value Date   INR 1.1 (H) 04/11/2022   APTT  Lab Results  Component Value Date   APTT 38 (H) 07/27/2019    Neurodiagnostics LTM EEG:  PENDING  ASSESSMENT  Earl [name] is a 63 y.o. male with hx of seizures, neuropathy, HTN, hypothyroidism, B12 deficiency, EtOH abuse, cirrhosis, anxiety/depression, colon cancer who presents as an IVC admit from [facility] after presenting  there with diagnosed visit visual hallucinations/psychosis.  Patient reported that a fox was biting his legs/pulling on his pant leg and he came to the hospital to get a rabies shot.    On exam, patient is sitting up in chair talking to his sister on  the phone.  He reiterates his story about a fox pulling at his left pant leg today [place] his property. He was able to give me clear and coherent history of his recent seizures and his medications.  He has a chronic left facial twitch that is known.  Moves all extremities.  Patient denied any desire to harm himself or others.  Patient was just discharged from [facility] 3 days ago where he was admitted due to altered mental status and feeling of fogginess.  He was placed on LTM EEG during this admission and 2 seizures were seen.  His Keppra dosage was increased to 1000 twice daily.  His Lyrica dose was previously reduced from 600 to 400 mg.  Patient states that his seizures began after this decrease.  While in the room I spoke to patient's Sister Eber Jones.  She endorses that patient has been slightly more agitated over the past few weeks and even worse over the past few days.  It is possible that his Keppra is causing this as a side effect.  RECOMMENDATIONS  - LTM EEG (ordered) - Seizure Precautions - Reduce Keppra dosage to 750mg  BID in the event that Keppra is causing increased agitation and/or questionable hallucinations. - continue home Lyrica ______________________________________________________________________    Pt seen by Neuro NP/APP and later by MD. Note/plan to be edited by MD as needed.    Lynnae January, DNP, AGACNP-BC [facility] Please use AMION for contact information & EPIC for messaging.  NEUROHOSPITALIST ADDENDUM Performed a face to face diagnostic evaluation.   I have reviewed the contents of history and physical exam as documented by PA/ARNP/Resident and agree with above documentation.  I have discussed and  formulated the above plan as documented. Edits to the note have been made as needed.  Erick Blinks, MD [facility] [phone]   If 7pm to 7am, please call on call as listed on AMION.

## 2023-05-06 NOTE — Progress Notes (Signed)
 These current vitals were not taken by this RN. Was taken by Nurse techs Madeline/Maciah

## 2023-05-06 NOTE — H&P (Signed)
 History and Physical    Patient: Earl [name] ZOX:[id number] DOB: 1960-08-02 DOA: 05/06/2023 DOS: the patient was seen and examined on 05/06/2023 PCP: [name], FNP  Patient coming from: Home; NOK: Imelda Pillow, [phone]   Chief Complaint: hallucinations  HPI: Earl [name] is a 63 y.o. male with medical history significant of colon cancer, depression, HTN, hypothyroidism, alcohol use d/o with cirrhosis, anxiety, and B12 deficiency presenting with visual hallucinations.  He reports that  there were foxes on his porch and that one of them was biting his pant leg and he could feel its teeth. He was sent to [facility] under IVC and is really upset about that and planning to file a complaint.  He is certain the foxes were  real and denies other hallucinations.  He has a chronic L eyelid twitch that has worsened over  the last few years.    ER Course:  Transferred from [facility] for visual hallucinations. Recent diagnosis of seizures on LTM, evaluating psych vs. Seizures.     Review of Systems: As mentioned in the history of present illness. All other systems reviewed and are negative. Past Medical History:  Diagnosis Date   Alcohol abuse    Anemia    Anxiety    Arthritis    Colon cancer (HCC)    Depression    Dysrhythmia    Gall stones    Heart burn    History of kidney stones    Hyperlipidemia    Hypertension    Hypothyroidism    Liver disease    Pernicious anemia    Tachycardia    Typical atrial flutter (HCC)    Vitamin B12 deficiency    Past Surgical History:  Procedure Laterality Date   A-FLUTTER ABLATION N/A 07/31/2016   Procedure: A-Flutter Ablation;  Surgeon: Hillis Range, MD;  Location: [facility];  Service: Cardiovascular;  Laterality: N/A;   BIOPSY  05/08/2022   Procedure: BIOPSY;  Surgeon: Lynann Bologna, MD;  Location: [facility] ENDOS[facility];  Service: Gastroenterology;;   COLONOSCOPY N/A 04/17/2016   Procedure: COLONOSCOPY;  Surgeon: Malissa Hippo,  MD;  Location: [facility];  Service: Endoscopy;  Laterality: N/A;  1200   COLONOSCOPY N/A 05/07/2017   Procedure: COLONOSCOPY;  Surgeon: Malissa Hippo, MD;  Location: [facility];  Service: Endoscopy;  Laterality: N/A;  200   COLONOSCOPY WITH PROPOFOL N/A 07/25/2020   Procedure: COLONOSCOPY WITH PROPOFOL;  Surgeon: Malissa Hippo, MD;  Location: [facility];  Service: Endoscopy;  Laterality: N/A;  12:10   ESOPHAGEAL BANDING  07/25/2020   Procedure: ESOPHAGEAL BANDING;  Surgeon: Malissa Hippo, MD;  Location: [facility];  Service: Endoscopy;;   ESOPHAGOGASTRODUODENOSCOPY (EGD) WITH PROPOFOL N/A 07/25/2020   Procedure: ESOPHAGOGASTRODUODENOSCOPY (EGD) WITH PROPOFOL;  Surgeon: Malissa Hippo, MD;  [facility]: [facility];  Service: Endoscopy;  Laterality: N/A;   ESOPHAGOGASTRODUODENOSCOPY (EGD) WITH PROPOFOL N/A 04/11/2021   Procedure: ESOPHAGOGASTRODUODENOSCOPY (EGD) WITH PROPOFOL;  Surgeon: Malissa Hippo, MD;  Location: [facility];  Service: Endoscopy;  Laterality: N/A;  1:00   ESOPHAGOGASTRODUODENOSCOPY (EGD) WITH PROPOFOL N/A 05/08/2022   Procedure: ESOPHAGOGASTRODUODENOSCOPY (EGD) WITH PROPOFOL;  Surgeon: Lynann Bologna, MD;  Location: [facility] ENDOS[facility];  Service: Gastroenterology;  Laterality: N/A;   implantable loop recorder placement  01/24/2019    Medtronic Reveal Linq model LNQ 11 implantable loop recorder (SN [id number] ) by Dr Johney Frame for evaluation of palpitations   MINOR CARPAL TUNNEL     right  POLYPECTOMY  04/17/2016   Procedure: POLYPECTOMY;  Surgeon: Malissa Hippo, MD;  [facility]: [facility];  Service: Endoscopy;;  sigmoid   POLYPECTOMY  07/25/2020   Procedure: POLYPECTOMY;  Surgeon: Malissa Hippo, MD;  Location: [facility];  Service: Endoscopy;;   Social History:  reports that he quit smoking about 44 years ago. His smoking use included cigarettes. He has never used smokeless tobacco. He reports that he does not currently use alcohol after a past usage of  about 8.0 standard drinks of alcohol per week. He reports that he does not use drugs.  Allergies  Allergen Reactions   Amitriptyline Anaphylaxis   Amoxicillin Anaphylaxis and Rash   Asa [Aspirin] Anaphylaxis   Penicillin G Anaphylaxis   Almecillin    Clonidine Derivatives Other (See Comments)    Severe Dry Mouth   Hydrochlorothiazide     Caused rectal bleeding and hematuria   Levothyroxine Nausea And Vomiting    Patient states that the 75 mcg is too strong for him.   Trintellix [Vortioxetine] Nausea And Vomiting    Family History  Problem Relation Age of Onset   Alzheimer's disease Mother    Hyperlipidemia Mother    Gallbladder disease Mother    Heart disease Father    Coronary artery disease Father    Liver disease Father    Thyroid disease Father    Hyperlipidemia Father    Heart attack Father    Cirrhosis Father    Gallbladder disease Sister    Coronary artery disease Paternal Grandfather     Prior to Admission medications   Medication Sig Start Date End Date Taking? Authorizing Provider  ALPRAZolam (XANAX) 1 MG tablet TAKE 1 TABLET BY MOUTH DAILY AS NEEDED FOR ANXIETY. Must last 30 DAYS. 12/29/22   Sater, Pearletha Furl, MD  clotrimazole (LOTRIMIN) 1 % cream Apply topically 2 (two) times daily. 05/03/23   Rolly Salter, MD  cyanocobalamin (VITAMIN B12) 1000 MCG/ML injection Inject 1 mL (1,000 mcg total) into the muscle every 30 (thirty) days. 12/29/22   Sater, Pearletha Furl, MD  diltiazem (CARDIZEM CD) 240 MG 24 hr capsule Take 1 capsule (240 mg total) by mouth daily. 05/03/23   Rolly Salter, MD  feeding supplement (ENSURE ENLIVE / ENSURE PLUS) LIQD Take 237 mLs by mouth 3 (three) times daily between meals. 05/03/23   Rolly Salter, MD  levETIRAcetam (KEPPRA) 1000 MG tablet Take 1 tablet (1,000 mg total) by mouth 2 (two) times daily. 05/03/23   Rolly Salter, MD  melatonin 3 MG TABS tablet Take 3 mg by mouth at bedtime as needed (sleep).    [provider]  ondansetron  (ZOFRAN) 4 MG tablet TAKE 1 TABLET BY MOUTH TWICE DAILY AS NEEDED FOR NAUSEA OR FOR VOMITING 07/18/21   Rehman, Joline Maxcy, MD  Oxcarbazepine (TRILEPTAL) 300 MG tablet Take 1 tablet (300 mg total) by mouth 2 (two) times daily. Patient taking differently: Take 300 mg by mouth daily. 12/10/21   Sater, Pearletha Furl, MD  pantoprazole (PROTONIX) 40 MG tablet Take 1 tablet (40 mg total) by mouth daily before breakfast. 04/28/23   Jannifer Rodney A, FNP  pregabalin (LYRICA) 200 MG capsule Take 1 capsule (200 mg total) by mouth in the morning, at noon, and at bedtime. Take 3 capsules (600 mg) po daily.    Prescription must last 30 or more days. 05/03/23   Rolly Salter, MD    Physical Exam: Vitals:   05/06/23 1415 05/06/23 1502  BP: (S) (!) 155/102 97/67  Pulse: (S) (!) 56 62  Temp: (S) 98 F (36.7 C)   SpO2: 98%    General:  Appears calm and comfortable and is in NAD, mildly agitated Eyes:  EOMI, normal iris, L eyelid twitch ENT:  grossly normal hearing, lips & tongue, mmm Neck:  no LAD, masses or thyromegaly Cardiovascular:  RRR, no m/r/g. No LE edema.  Respiratory:   CTA bilaterally with no wheezes/rales/rhonchi.  Normal respiratory effort. Abdomen:  soft, NT, ND Skin:  no rash or induration seen on limited exam; no evidence of fox attack on BLE Musculoskeletal:  grossly normal tone BUE/BLE, good ROM, no bony abnormality Psychiatric:  agitated/paranoid mood and affect, speech pressured but appropriate, AOx3 Neurologic:  CN 2-12 grossly intact other than L eyelid  twitch, moves all extremities in coordinated fashion   Radiological Exams on Admission: Independently reviewed - see discussion in A/P where applicable  No results found.  EKG: pending   Labs on Admission: I have personally reviewed the available labs and imaging studies at the time of the admission.  Pertinent labs from [facility]:  UDS + cannabinoid Unremarkable UA Stable CBC Stable CMP Normal TSH ETOH negative   Assessment  and Plan: Principal Problem:   Visual hallucinations Active Problems:   Mixed hyperlipidemia   Paroxysmal atrial fibrillation (HCC)   Seizure (HCC)   Visual hallucination    Visual hallucinations Patient with recent +LTM EEG for seizures, now presenting with paranoia and visual hallucinations Primary psychiatric illness vs. Persistent seizure d/o Consult psych, sent under IVC paperwork Consult neurology for LTM EEG Observe on telemetry L eyelid spasms - ?seizure related vs. Mood d/o  Seizures LTM EEG concerning for seizures during last admission Neurology consulted again Keppra dose increased during last hospitalization Continue Lyrica- he reports that dose was inappropriately decreased from 600 to 400 mg by neurology and that hospitalization for 30 days is required to taper off Lyrica per the internet   Chronic neuropathy as well as pain syndrome Patient is chronically on Lyrica 600 mg Patient was reportedly trying to self taper previously - see note above Neurology recommended to continue the same home dose at time of last dc   Anxiety and depression Continue Trileptal, Xanax   Palpitations On Cardizem Given sinus bradycardia on telemetry during last hospitalization, they reduced the dose from 300-40.   Tinea cruris Topical clotrimazole   GERD/Cirrhosis/varices Continue PPI Due to sinus bradycardia beta-blocker was stopped during prior hospitalization No evidence of asterixis Outpatient follow-up with GI as already scheduled No longer drinking      Advance Care Planning:   Code Status: Full Code - Code status was discussed with the patient and/or family at the time of admission.  The patient would want to receive full resuscitative measures at this time.   Consults: Neurology, psychiatry  DVT Prophylaxis: Lovenox  Family Communication: None present  Severity of Illness: The appropriate patient status for this patient is OBSERVATION. Observation status is  judged to be reasonable and necessary in order to provide the required intensity of service to ensure the patient's safety. [name] presenting symptoms, physical exam findings, and initial radiographic and laboratory data in the context of their medical condition is felt to place them at decreased risk for further clinical deterioration. Furthermore, it is anticipated that the patient will be medically stable for discharge from the hospital within 2 midnights of admission.   Author: Jonah Blue, MD 05/06/2023 5:02 PM  For on call review  [email or web address]

## 2023-05-06 NOTE — Plan of Care (Signed)

## 2023-05-06 NOTE — Progress Notes (Signed)
 1500 Patient A&Ox4. Rambling with paranoia. Follows commands. Steady on feet when ambulating. Room air. Vitals are stable. Patient voiced no pain. Has left eye twitching, and able to feel twitch. Right thigh bruising and abrasion to left lower extremity. Patient is admitted as IVC. MD paged by LPN for admission orders. Patient in bed in lowest position, call bell within reach.   1530 Candice LPN, is now Primary on patient.

## 2023-05-07 ENCOUNTER — Observation Stay (HOSPITAL_COMMUNITY): Payer: MEDICAID

## 2023-05-07 DIAGNOSIS — Z83438 Family history of other disorder of lipoprotein metabolism and other lipidemia: Secondary | ICD-10-CM | POA: Diagnosis not present

## 2023-05-07 DIAGNOSIS — Z888 Allergy status to other drugs, medicaments and biological substances status: Secondary | ICD-10-CM | POA: Diagnosis not present

## 2023-05-07 DIAGNOSIS — G5793 Unspecified mononeuropathy of bilateral lower limbs: Secondary | ICD-10-CM | POA: Diagnosis present

## 2023-05-07 DIAGNOSIS — Z820 Family history of epilepsy and other diseases of the nervous system: Secondary | ICD-10-CM | POA: Diagnosis not present

## 2023-05-07 DIAGNOSIS — E538 Deficiency of other specified B group vitamins: Secondary | ICD-10-CM | POA: Diagnosis present

## 2023-05-07 DIAGNOSIS — E039 Hypothyroidism, unspecified: Secondary | ICD-10-CM | POA: Diagnosis present

## 2023-05-07 DIAGNOSIS — E782 Mixed hyperlipidemia: Secondary | ICD-10-CM | POA: Diagnosis present

## 2023-05-07 DIAGNOSIS — Z87442 Personal history of urinary calculi: Secondary | ICD-10-CM | POA: Diagnosis not present

## 2023-05-07 DIAGNOSIS — F32A Depression, unspecified: Secondary | ICD-10-CM | POA: Diagnosis present

## 2023-05-07 DIAGNOSIS — R441 Visual hallucinations: Secondary | ICD-10-CM | POA: Diagnosis present

## 2023-05-07 DIAGNOSIS — G8929 Other chronic pain: Secondary | ICD-10-CM | POA: Diagnosis present

## 2023-05-07 DIAGNOSIS — R4182 Altered mental status, unspecified: Secondary | ICD-10-CM | POA: Diagnosis present

## 2023-05-07 DIAGNOSIS — F1011 Alcohol abuse, in remission: Secondary | ICD-10-CM | POA: Diagnosis present

## 2023-05-07 DIAGNOSIS — R002 Palpitations: Secondary | ICD-10-CM | POA: Diagnosis present

## 2023-05-07 DIAGNOSIS — Z886 Allergy status to analgesic agent status: Secondary | ICD-10-CM | POA: Diagnosis not present

## 2023-05-07 DIAGNOSIS — R410 Disorientation, unspecified: Secondary | ICD-10-CM | POA: Diagnosis not present

## 2023-05-07 DIAGNOSIS — Y92009 Unspecified place in unspecified non-institutional (private) residence as the place of occurrence of the external cause: Secondary | ICD-10-CM | POA: Diagnosis not present

## 2023-05-07 DIAGNOSIS — T426X5A Adverse effect of other antiepileptic and sedative-hypnotic drugs, initial encounter: Secondary | ICD-10-CM | POA: Diagnosis present

## 2023-05-07 DIAGNOSIS — Z880 Allergy status to penicillin: Secondary | ICD-10-CM | POA: Diagnosis not present

## 2023-05-07 DIAGNOSIS — R569 Unspecified convulsions: Secondary | ICD-10-CM | POA: Diagnosis not present

## 2023-05-07 DIAGNOSIS — Z85038 Personal history of other malignant neoplasm of large intestine: Secondary | ICD-10-CM | POA: Diagnosis not present

## 2023-05-07 DIAGNOSIS — G40909 Epilepsy, unspecified, not intractable, without status epilepticus: Secondary | ICD-10-CM | POA: Diagnosis present

## 2023-05-07 DIAGNOSIS — K7030 Alcoholic cirrhosis of liver without ascites: Secondary | ICD-10-CM | POA: Diagnosis present

## 2023-05-07 DIAGNOSIS — B356 Tinea cruris: Secondary | ICD-10-CM | POA: Diagnosis present

## 2023-05-07 DIAGNOSIS — I480 Paroxysmal atrial fibrillation: Secondary | ICD-10-CM | POA: Diagnosis present

## 2023-05-07 DIAGNOSIS — Z885 Allergy status to narcotic agent status: Secondary | ICD-10-CM | POA: Diagnosis not present

## 2023-05-07 DIAGNOSIS — Z87891 Personal history of nicotine dependence: Secondary | ICD-10-CM | POA: Diagnosis not present

## 2023-05-07 DIAGNOSIS — I10 Essential (primary) hypertension: Secondary | ICD-10-CM | POA: Diagnosis present

## 2023-05-07 LAB — CBC
HCT: 46.4 % (ref 39.0–52.0)
Hemoglobin: 15.8 g/dL (ref 13.0–17.0)
MCH: 31.5 pg (ref 26.0–34.0)
MCHC: 34.1 g/dL (ref 30.0–36.0)
MCV: 92.6 fL (ref 80.0–100.0)
Platelets: 144 10*3/uL — ABNORMAL LOW (ref 150–400)
RBC: 5.01 MIL/uL (ref 4.22–5.81)
RDW: 13.6 % (ref 11.5–15.5)
WBC: 7.2 10*3/uL (ref 4.0–10.5)
nRBC: 0 % (ref 0.0–0.2)

## 2023-05-07 LAB — BASIC METABOLIC PANEL WITH GFR
Anion gap: 10 (ref 5–15)
BUN: 13 mg/dL (ref 8–23)
CO2: 24 mmol/L (ref 22–32)
Calcium: 9.3 mg/dL (ref 8.9–10.3)
Chloride: 102 mmol/L (ref 98–111)
Creatinine, Ser: 0.98 mg/dL (ref 0.61–1.24)
GFR, Estimated: >60 mL/min (ref >60)
Glucose, Bld: 90 mg/dL (ref 70–99)
Potassium: 3.4 mmol/L — ABNORMAL LOW (ref 3.5–5.1)
Sodium: 136 mmol/L (ref 135–145)

## 2023-05-07 LAB — AMMONIA: Ammonia: 29 umol/L (ref 9–35)

## 2023-05-07 MED ORDER — THIAMINE MONONITRATE 100 MG PO TABS
100.0000 mg | ORAL_TABLET | Freq: Every day | ORAL | Status: DC
  Administered 2023-05-07 – 2023-05-08 (×2): 100 mg via ORAL
  Filled 2023-05-07 (×2): qty 1

## 2023-05-07 MED ORDER — LEVETIRACETAM 500 MG PO TABS
750.0000 mg | ORAL_TABLET | Freq: Two times a day (BID) | ORAL | Status: DC
  Administered 2023-05-07 – 2023-05-08 (×3): 750 mg via ORAL
  Filled 2023-05-07 (×3): qty 1

## 2023-05-07 MED ORDER — OXCARBAZEPINE 300 MG PO TABS
300.0000 mg | ORAL_TABLET | Freq: Once | ORAL | Status: AC
  Administered 2023-05-07: 300 mg via ORAL
  Filled 2023-05-07: qty 1

## 2023-05-07 MED ORDER — OXCARBAZEPINE 300 MG PO TABS
600.0000 mg | ORAL_TABLET | Freq: Two times a day (BID) | ORAL | Status: DC
  Administered 2023-05-07 – 2023-05-08 (×2): 600 mg via ORAL
  Filled 2023-05-07 (×3): qty 2

## 2023-05-07 NOTE — Progress Notes (Signed)
 Pt is A&O x 4, appears anxious. VSS, on room air. Denies pain. Sinus rhythm on the cardiac monitor. EEG in progress.   Noted pt has no PIV access and pt refused to place new PIV. Dr. Toniann Fail notified via secure chat.  Safety maintained. Bed alarm on. Call bell in reach. Will continue to monitor.

## 2023-05-07 NOTE — Progress Notes (Signed)
 Pt is A&O x 4. Appears anxious and restless. VSS, on room air. Sinus brady with Hr in 55-60 on the cardiac monitor.  1:1 sitter at bedside. Seizure precautions and safety precautions maintained. Pt ambulating independently in the room.  Safety maintained. Will continue to monitor.

## 2023-05-07 NOTE — Consult Note (Signed)
 [facility] Psychiatric Consult Initial  Patient Name: .Earl [name]  MRN: [id number]  DOB: [birth year]  Consult Order details:   Consult to psychiatry Location: [facility]; Reason for Consult? hallucinations  Once     Complete    Ordering Provider: Jonah Blue, MD  Provider: (Not yet assigned)  Question Answer Comment  Location [facility]   Reason for Consult? hallucinations      05/06/23 1702    Mode of Visit: In person    Psychiatry Consult Evaluation  Service Date: May 07, 2023 LOS:  LOS: 1 day  Chief Complaint Hallucinations  Primary Psychiatric Diagnoses  R/o Post-ictal hallucination 2. Substance-induced hallucination 3. Acute Psychosis 4. Alcohol use disorder, in sustained remission  Assessment  Earl [name] is a 63 y.o. male admitted medically on 05/06/2023  2:15 PM for hallucinations concerning for psychosis. He carries the psychiatric diagnoses of anxiety, depression, EtOH abuse and has a past medical history of seizures, cirrhosis, AF, HTN, and HLD.   Patient was IVC'd in [facility] for reported hallucinations, with "acute psychosis" cited as the reason for commitment. On current psychiatric evaluation, there is no evidence of ongoing psychosis. The patient was organized in thought, denied hallucinations or delusional beliefs, and did not appear to be responding to internal stimuli. His report of being attacked by a fox appears plausible, [place] and collateral from his sister confirming frequent fox sightings in the area. Both the patient and his sister deny any prior psychiatric history of psychosis.  The clinical picture is complicated by recent seizures and discharge on Keppra, as well as recent tapering of pregabalin Lyrica, which the patient reports has contributed to a sense of feeling unwell. Although his ethanol level was negative, there is a known history of alcohol use, and the differential  includes post-ictal psychosis, neurocognitive disorder, vitamin deficiency, and possible GABAergic withdrawal related to alcohol, benzodiazepines (Xanax), and pregabalin. In light of this, and with thiamine level pending, the patient was started on oral thiamine empirically.  At this time, the patient does not exhibit signs of active psychosis, and there are no ongoing safety concerns or clinical justification to maintain the IVC. IVC will be rescinded. Neurology will manage his antiepileptic regimen. The patient declined outpatient psychiatric follow-up, and we have no further psychiatric recommendations at this time. Psychiatry to sign off.   Diagnoses:  Active Hospital problems: Principal Problem:   Visual hallucinations Active Problems:   Mixed hyperlipidemia   Paroxysmal atrial fibrillation (HCC)   Seizure (HCC)   Visual hallucination     Plan   ## Psychiatric Medication Recommendations:  -- No psychiatric medications are indicated for the patient at this time.  ## Medical Decision Making Capacity: Not specifically addressed in this encounter  ## Further Work-up:  -- Patient is amenable to working with Neurology for further focused work up -- Most recent EKG on 05/01/23 had QtC of 452 -- Pertinent labwork reviewed earlier this admission includes:   05/07/2023: Vit B12 wnl Vit B1 pending BMP: L K CBC: L Plt Ammonia wnl  05/06/2023: TSH and T4 wnl Salicylate wnl Acetaminophen wnl EtOH wnl CMP: H Prot and ALP CBC: H MPV and L Plt UDS: + cannabinoids UA: wnl   ## Disposition:-- There are no psychiatric contraindications to discharge at this time  ## Behavioral / Environmental: - No specific recommendations at this time.     ## Safety and Observation Level:  - Based on my clinical evaluation,  I estimate the patient to be at minimal risk of self harm in the current setting. - At this time, we recommend  routine. This decision is based on my review of the chart  including patient's history and current presentation, interview of the patient, mental status examination, and consideration of suicide risk including evaluating suicidal ideation, plan, intent, suicidal or self-harm behaviors, risk factors, and protective factors. This judgment is based on our ability to directly address suicide risk, implement suicide prevention strategies, and develop a safety plan while the patient is in the clinical setting. Please contact our team if there is a concern that risk level has changed.  CSSR Risk Category:C-SSRS RISK CATEGORY: No Risk  Suicide Risk Assessment: Patient has following modifiable risk factors for suicide: access to guns and under treated depression , which we are addressing by attempting to connect patient with outpatient resources if willing. Patient has following non-modifiable or demographic risk factors for suicide: male gender Patient has the following protective factors against suicide: Supportive family, Frustration tolerance, no history of suicide attempts, and no history of NSSIB  Thank you for this consult request. Recommendations have been communicated to the primary team.  We will sign off at this time.   Janith Lima, Medical Student       History of Present Illness  Relevant Aspects of Hospital and ED Course:  Admitted on 05/06/2023 for hallucinations from [facility] Emergency Department. They were initially seen for acute psychosis followed by IVC and transferral to [facility] for further evaluation.   Patient Report:  Earl [name] is a 63 y.o. male with a past psychiatric history of anxiety, depression, and EtOH abuse and past medical history of seizures, cirrhosis, AF, HTN, and HLD transferred to [facility] from the [facility] Emergency Department for evaluation of hallucinations.  Earl [name] reported being in his usual state of health until 2 days ago when he reported seeing a rabid, red fox tugging at the bottom of his pants.  He stated that the fox snuck up on him from the side while he was standing on the porch of his home and bit his right leg. He reported that he was unsure if he actually saw a fox, but stated that he has been having perceptual disturbances and wild thoughts since he started to cut down on his Lyrica 10 days ago. He stated that his PCP instructed him to discontinue this mediation and that he has been unable to tolerate the taper, prompting the start of his perceptual disturbances. He also reported stopping his Lyrica abruptly a few days ago and experienced a seizure. Patient denied any further hallucinations since then, but endorsed a history of an unspecified hallucination approximately 20 years ago when he was taking Xanax 4 mg daily for 3 months to treat his hypertension. Patient also reported believing that his next door and across-the-street neighbors are out to kill him for the past three months. He also reported believing these neighbors use drugs and having gotten into an argument about drug use with them. Patient endorsed recent multi-month history of cannabis use, eating 20 mg of edibles daily to "break the boredom," but stated he stopped about two months ago. He denied any other recent alcohol or drug use. Following his most recent hallucination, he reported visiting the emergency department at [facility] for treatment following rabies exposure. He was subsequently [facility] for acute psychosis and transferred to [facility] for further evaluation. Since admission to [facility], patient denied any  SI, HI, or AVH. He stated that he has had no further perceptual disturbances outside of the context of his Lyrica taper.  Psych ROS:  Depression: Patient endorsed symptoms suggestive of major depressive disorder including a multiyear history of low mood, sleep changes, feelings of guilt, decreased energy, and psychomotor agitation. Anxiety: Patient endorsed symptoms suggestive of generalized anxiety disorder  including a prolonged period of feeling on edge/tense and catastrophic thinking/feelings of dying over situations that the patient describes as "nothing." Mania (lifetime and current): Patient denied a constellation of symptoms suggestive of a hypomanic or manic episode, reporting a multiyear period of elevated mood with mild distractibility, mild idea increase, and slightly fast speech. Psychosis: (lifetime and current): Patient denied any symptoms suggestive of past episodes of psychosis. Patient also denied any symptoms currently suggest of an episode of psychosis Delusions: Patient reported symptoms consistent with persecutory delusions as detailed above, but further collateral from patient's sister needs to be collected to verify patient's situation.  PTSD: Patient denied a constellation of symptoms suggestive of PTSD, including hyperarousal, nightmares, or rumination on trauma.  Collateral information:  Contacted Kelton Pillar at [phone] on 05/07/2023.  On interview, Ms. Katrinka Blazing, the patient's sister, expressed concern about patient's recent IVC. She stated that she was able to confirm the patient's timeline of events regarding the incident with the fox. She stated that she was not with the patient when he initially saw the fox, but reported that the patient called her soon after. She also stated that the patient's property is infested with foxes running around everywhere. She was with him after that point and stated that he did not seem changed from his baseline. She believed that he has not exhibited any signs of psychosis ever, only feeling sick after tapering his Lyrica recently. She reported that when the patient was IVC'd, he was not psychotic, but was having a panic attack and that the physician misunderstood the patient's words. She was also able to confirm the patient's accounts of having dangerous neighbors who wanted to hurt the patient, providing details similar to the patient. For  safety planning, the sister stated that the patient likely had access to a few hunting guns, but was unsure if he had ever fired a gun. When asked about the patient's safety around weapons, the sister expressed no concern stating that the patient would never hurt himself. The sister also denied any knowledge of the patient having stockpile of pills (Xanax), but stated that she was similarly not concerned about the patient hurting himself through such means.   Review of Systems  Psychiatric/Behavioral:  Positive for depression and substance abuse. Negative for hallucinations and suicidal ideas. The patient is nervous/anxious. The patient does not have insomnia.      Psychiatric and Social History  Psychiatric History:  Information collected from patient.  Prev Dx/Sx: Patient endorsed history of anxiety and depression. Current Psych Provider: Patient denied having a current psych provider. Home Meds (current): Patient denied taking any psychiatric medications, but stated having a stock pile of alprazolam at his household. Previous Med Trials: Patient endorsed trying multiple antidepressants in the past, specifically identifying Effexor. He reported that none of the medications he tried worked well. Therapy: Patient denied history of any therapy.  Prior Psych Hospitalization: Patient denied any past psych hospitalizations.  Prior Self Harm: Patient denied any past self harm. Prior Violence: Patient denied any history of prior violence.  Family Psych History: Patient denied any relevant family psychiatric history. Family Hx suicide: Patient denied  any history of suicide in his family.  Social History:  Developmental Hx: Patient reported growing up in [place], [place] in a traumatic household. He stated that he experienced emotional trauma from his dad. Patient reported currently living in [place], [place]. Educational Hx: Did not assess. Occupational Hx: Patient denied working. Patient reported he was  on SSI disability and Medicaid. Legal Hx: Patient endorsed a history of DUI 5 years ago. Living Situation: Patient reported living alone [place], [place] area. Spiritual Hx: Did not assess Access to weapons/lethal means: Patient reported having access to his father's old pistol, but no other weapons. Patient also endorsed large stockpile of alprazolam.   Substance History Alcohol: Patient reported intermittent history of drinking alcohol, totaling 10 years of heavy daily drinking.  Type of alcohol: 10% Beer; tall-boys Last Drink: Patient reported over 1 year ago. Number of drinks per day: During 10 years of heavy drinking, 2-3 tall-boys History of alcohol withdrawal seizures: Patient denied. History of DT's: Patient denied. Tobacco: Patient endorsed 1 year history of smoking cigarettes from the ages of [age]. Patient also endorsed remote 2 year history of daily snuff use. Illicit drugs: Patient endorsed a recent, multiple-month history of using marijuana gummies. Patient reported they contained Delta 8 or 9 and used approximately 20 mg daily up until 2 months ago. Patient denied any other recreational drug use. Prescription drug abuse: Patient denied any prescription drug use. Rehab hx: Patient denied any history of rehab.  Exam Findings  Physical Exam: Targeted physical exam was performed on patient to rule out relevant diagnoses. Vital Signs:  Temp:  [97.3 F (36.3 C)-98.3 F (36.8 C)] 98.3 F (36.8 C) (04/10 1312) Pulse Rate:  [50-62] 60 (04/10 1312) Resp:  [17-18] 17 (04/10 1312) BP: (97-120)/(65-72) 114/72 (04/10 1312) SpO2:  [94 %-100 %] 99 % (04/10 1312) Blood pressure 114/72, pulse 60, temperature 98.3 F (36.8 C), temperature source Oral, resp. rate 17, SpO2 99%. There is no height or weight on file to calculate BMI.  Physical Exam Eyes:     Conjunctiva/sclera: Conjunctivae normal.  Pulmonary:     Effort: Pulmonary effort is normal.  Musculoskeletal:     Comments: No  asterixis noted.  Neurological:     Mental Status: He is alert and oriented to person, place, and time.     Mental Status Exam: General Appearance: Appears stated age, disheveled, well-nourished  Orientation:  Full (Time, Place, and Person)  Memory:  Immediate;   Poor Recent;   Poor Remote;   Poor  Concentration:  Did not assess  Recall:  Poor  Attention  Fair  Eye Contact:  Good  Speech:  Clear and Coherent and slightly fast  Language:  Good  Volume:  Normal  Mood: "Don't feel like doing anything"  Affect:  Congruent and Flat  Thought Process:  Coherent and Disorganized  Thought Content:  Rumination and focused on relationship with father  Suicidal Thoughts:  No  Homicidal Thoughts:  No  Judgement:  Fair  Insight:  Shallow  Psychomotor Activity:  Increased and Restlessness  Akathisia:  NA  Fund of Knowledge:  Good      Assets:  Social Support  Cognition:  WNL  ADL's:  Intact  AIMS (if indicated):        Other History   These have been pulled in through the EMR, reviewed, and updated if appropriate.  Family History:  [name] family history includes Alzheimer's disease in his mother; Cirrhosis in his father; Coronary artery disease in his  father and paternal grandfather; Gallbladder disease in his mother and sister; Heart attack in his father; Heart disease in his father; Hyperlipidemia in his father and mother; Liver disease in his father; Thyroid disease in his father.  Medical History: Past Medical History:  Diagnosis Date   Alcohol abuse    Anemia    Anxiety    Arthritis    Colon cancer (HCC)    Depression    Dysrhythmia    Gall stones    Heart burn    History of kidney stones    Hyperlipidemia    Hypertension    Hypothyroidism    Liver disease    Pernicious anemia    Tachycardia    Typical atrial flutter (HCC)    Vitamin B12 deficiency     Surgical History: Past Surgical History:  Procedure Laterality Date   A-FLUTTER ABLATION N/A  07/31/2016   Procedure: A-Flutter Ablation;  Surgeon: Hillis Range, MD;  Location: [facility]VAS[facility];  Service: Cardiovascular;  Laterality: N/A;   BIOPSY  05/08/2022   Procedure: BIOPSY;  Surgeon: Lynann Bologna, MD;  Location: [facility] ENDOSCOPY;  Service: Gastroenterology;;   COLONOSCOPY N/A 04/17/2016   Procedure: COLONOSCOPY;  Surgeon: Malissa Hippo, MD;  Location: [facility]O [facility];  Service: Endoscopy;  Laterality: N/A;  1200   COLONOSCOPY N/A 05/07/2017   Procedure: COLONOSCOPY;  Surgeon: Malissa Hippo, MD;  Location: [facility];  Service: Endoscopy;  Laterality: N/A;  200   COLONOSCOPY WITH PROPOFOL N/A 07/25/2020   Procedure: COLONOSCOPY WITH PROPOFOL;  Surgeon: Malissa Hippo, MD;  Location: [facility];  Service: Endoscopy;  Laterality: N/A;  12:10   ESOPHAGEAL BANDING  07/25/2020   Procedure: ESOPHAGEAL BANDING;  Surgeon: Malissa Hippo, MD;  Location: [facility];  Service: Endoscopy;;   ESOPHAGOGASTRODUODENOSCOPY (EGD) WITH PROPOFOL N/A 07/25/2020   Procedure: ESOPHAGOGASTRODUODENOSCOPY (EGD) WITH PROPOFOL;  Surgeon: Malissa Hippo, MD;  Location: [facility];  Service: Endoscopy;  Laterality: N/A;   ESOPHAGOGASTRODUODENOSCOPY (EGD) WITH PROPOFOL N/A 04/11/2021   Procedure: ESOPHAGOGASTRODUODENOSCOPY (EGD) WITH PROPOFOL;  Surgeon: Malissa Hippo, MD;  Location: [facility];  Service: Endoscopy;  Laterality: N/A;  1:00   ESOPHAGOGASTRODUODENOSCOPY (EGD) WITH PROPOFOL N/A 05/08/2022   Procedure: ESOPHAGOGASTRODUODENOSCOPY (EGD) WITH PROPOFOL;  Surgeon: Lynann Bologna, MD;  Location: [facility] ENDOS[facility];  Service: Gastroenterology;  Laterality: N/A;   implantable loop recorder placement  01/24/2019    Medtronic Reveal Linq model LNQ 11 implantable loop recorder (SN [id number] ) by Dr Johney Frame for evaluation of palpitations   MINOR CARPAL TUNNEL     right   POLYPECTOMY  04/17/2016   Procedure: POLYPECTOMY;  Surgeon: Malissa Hippo, MD;  [facility];  Service: Endoscopy;;   sigmoid   POLYPECTOMY  07/25/2020   Procedure: POLYPECTOMY;  Surgeon: Malissa Hippo, MD;  Location: [facility];  Service: Endoscopy;;     Medications:   Current Facility-Administered Medications:    acetaminophen (TYLENOL) tablet 650 mg, 650 mg, Oral, Q4H PRN **OR** acetaminophen (TYLENOL) suppository 650 mg, 650 mg, Rectal, Q4H PRN, Jonah Blue, MD   ALPRAZolam Prudy Feeler) tablet 1 mg, 1 mg, Oral, BID PRN, Jonah Blue, MD   diltiazem (CARDIZEM CD) 24 hr capsule 240 mg, 240 mg, Oral, Daily, Jonah Blue, MD, 240 mg at 05/07/23 0939   enoxaparin (LOVENOX) injection 40 mg, 40 mg, Subcutaneous, Q24H, Jonah Blue, MD, 40 mg at 05/06/23 1832   hydrALAZINE (APRESOLINE) injection 5 mg, 5 mg, Intravenous, Q4H PRN, Jonah Blue,  MD   levETIRAcetam (KEPPRA) tablet 750 mg, 750 mg, Oral, BID, Gevena Mart A, NP, 750 mg at 05/07/23 0936   LORazepam (ATIVAN) injection 4 mg, 4 mg, Intravenous, Q5 Min x 2 PRN, Jonah Blue, MD   ondansetron [facility]) tablet 4 mg, 4 mg, Oral, Q6H PRN **OR** ondansetron (ZOFRAN) injection 4 mg, 4 mg, Intravenous, Q6H PRN, Jonah Blue, MD   Oxcarbazepine (TRILEPTAL) tablet 300 mg, 300 mg, Oral, Once, Rejeana Brock, MD   Oxcarbazepine (TRILEPTAL) tablet 600 mg, 600 mg, Oral, BID, Rejeana Brock, MD   pantoprazole (PROTONIX) EC tablet 40 mg, 40 mg, Oral, QAC breakfast, Jonah Blue, MD, 40 mg at 05/07/23 0600   pregabalin (LYRICA) capsule 200 mg, 200 mg, Oral, TID, Jonah Blue, MD, 200 mg at 05/07/23 1610   sodium chloride flush (NS) 0.9 % injection 3 mL, 3 mL, Intravenous, Q12H, Jonah Blue, MD, 3 mL at 05/07/23 1026   thiamine (VITAMIN B1) tablet 100 mg, 100 mg, Oral, Daily, Carrion-Carrero, Arlone Lenhardt, MD  Allergies: Allergies  Allergen Reactions   Amitriptyline Anaphylaxis   Amoxicillin Anaphylaxis and Rash   Asa [Aspirin] Anaphylaxis   Penicillin G Anaphylaxis   Almecillin    Clonidine Derivatives Other (See  Comments)    Severe Dry Mouth   Hydrochlorothiazide     Caused rectal bleeding and hematuria   Levothyroxine Nausea And Vomiting    Patient states that the 75 mcg is too strong for him.   Trintellix [Vortioxetine] Nausea And Vomiting   Janith Lima, Medical Student  I personally was present and performed or re-performed the history, physical exam and medical decision-making activities of this service and have verified that the service and findings are accurately documented in the student's note.   Signed: Lorri Frederick, MD

## 2023-05-07 NOTE — Plan of Care (Signed)

## 2023-05-07 NOTE — Progress Notes (Signed)
 [facility] ROUNDING NOTE Earl [name] FAO:[id number]  DOB: 1960-11-11  DOA: 05/06/2023  PCP: [name], FNP  05/07/2023,7:20 AM  LOS: 1 day    Code Status: full   From:  home    Current Dispo: ?home   63 year old white male B12 deficiency hypothyroidism Alcoholic liver disease with cirrhosis-seizures supposed to be on Keppra Chronic pain peripheral neuropathy on Lyrica Recent hospitalization 4/4-05/03/18/2025 self tapering Lyrica 600-400-developed left-sided facial spasm-had long-term EEG  later admitted to having ongoing facial spasm for 5 to 6 years Keppra was increased to thousand twice daily he was  4/10 return to ED  from [facility] ED [place] rambling paranoid left eye twitching right thigh bruising and abrasion-had visual hallucinations reported Foxes on the porch 1 biting him-- chronic left eyelid twitch worsened Neurology saw the patient, cut back the Keppra to 750 twice daily and recommended LTM EEG to rule out focal sz  he was IVC on admission and psychiatry was consulted  Plan  Focal seizure versus visual hallucinations secondary to either seizure disorder versus/side effect Keppra Long-term EEG still pending-neurology directing management-continue Keppra 750 twice daily is on Lyrica 200 3 times daily additionally Await further word from them  ?  Psychosis-he is IVC Unclear if this is secondary to medication effect versus not-minimize meds that can alter sensorium discontinue Benadryl for now Continues on Trileptal 300 twice daily  EtOH related cirrhosis Off of beta-blocker since last hospital stay-no asterixis and no concern for withdrawal He has appointment with outpatient GI Can continue Xanax 1 mg twice daily as needed anxiety-if having seizures as IV Ativan  Palpitations Continue Cardizem 240 down from 300  Tinea cruris Continue topical clotrimazole  Chronic pain and neuropathy See above dosing of meds    DVT prophylaxis: Lovenox  Status is: Observation The  patient will require care spanning > 2 midnights and should be moved to inpatient because:   Requires further inpatient management  Subjective: Slightly anxious-no current hallucinations no fever no chills No delusions Can name time place person No pain no fever Has questions about being on Adderall would like to be prescribed this-I have declined  Objective + exam Vitals:   05/06/23 2212 05/06/23 2215 05/06/23 2342 05/07/23 0330  BP: 114/65 114/65 105/65 120/66  Pulse: (!) 55  (!) 59 (!) 51  Resp:   18 18  Temp:   97.9 F (36.6 C) (!) 97.3 F (36.3 C)  TempSrc:   Oral Oral  SpO2: 94%  99% 100%   There were no vitals filed for this visit.  Examination: EOMI NCAT HD S1-S2 no murmur Chest clear Abdomen open follow-up subclinical Abdomen soft no rebound no guarding No lower extremity edema Power 5/5  Data Reviewed: reviewed   CBC    Component Value Date/Time   WBC 4.3 05/03/2023 0730   RBC 4.35 05/03/2023 0730   HGB 13.8 05/03/2023 0730   HGB 15.0 04/28/2023 1449   HCT 39.6 05/03/2023 0730   HCT 44.0 04/28/2023 1449   PLT 94 (L) 05/03/2023 0730   PLT 136 (L) 04/28/2023 1449   MCV 91.0 05/03/2023 0730   MCV 92 04/28/2023 1449   MCH 31.7 05/03/2023 0730   MCHC 34.8 05/03/2023 0730   RDW 13.1 05/03/2023 0730   RDW 12.3 04/28/2023 1449   LYMPHSABS 0.8 05/01/2023 1540   LYMPHSABS 1.5 04/28/2023 1449   MONOABS 0.3 05/01/2023 1540   EOSABS 0.0 05/01/2023 1540   EOSABS 0.1 04/28/2023 1449   BASOSABS 0.0 05/01/2023 1540  BASOSABS 0.0 04/28/2023 1449      Latest Ref Rng & Units 05/03/2023    7:30 AM 05/02/2023    9:00 AM 05/01/2023    3:40 PM  CMP  Glucose 70 - 99 mg/dL 84  65  161   BUN 8 - 23 mg/dL 17  16  21    Creatinine 0.61 - 1.24 mg/dL 0.96  0.45  4.09   Sodium 135 - 145 mmol/L 141  141  140   Potassium 3.5 - 5.1 mmol/L 3.4  3.1  3.3   Chloride 98 - 111 mmol/L 106  106  106   CO2 22 - 32 mmol/L 27  24  23    Calcium 8.9 - 10.3 mg/dL 8.6  9.2  9.3   Total  Protein 6.5 - 8.1 g/dL   7.3   Total Bilirubin 0.0 - 1.2 mg/dL   1.3   Alkaline Phos 38 - 126 U/L   94   AST 15 - 41 U/L   27   ALT 0 - 44 U/L   27     Scheduled Meds:  diltiazem  240 mg Oral Daily   enoxaparin (LOVENOX) injection  40 mg Subcutaneous Q24H   levETIRAcetam  1,000 mg Oral BID   Oxcarbazepine  300 mg Oral BID   pantoprazole  40 mg Oral QAC breakfast   pregabalin  200 mg Oral TID   sodium chloride flush  3 mL Intravenous Q12H   Continuous Infusions:  Time  45  Rhetta Mura, MD  [facility]

## 2023-05-07 NOTE — Progress Notes (Signed)
 NEUROLOGY CONSULT FOLLOW UP NOTE   Date of service: May 07, 2023 Patient Name: Earl [name] MRN:  [id number] DOB:  [birth year]  Interval Hx/subjective   Patient is awake and alert walking around his room in NAD.  Patient states he wants to go home.  He tells me his whole problem is due to his MD cutting his Lyrica to 400mg  and it made him crazy so he has gone back up on to 600mg  and that is when he started having seizures.  Yesterday he states he went to the hospital for a rabies shot because a fox grabbed a hold of his pant leg and the hospital would not give him the shots and sent him here.  No new neurological events overnight   Vitals   Vitals:   05/06/23 2342 05/07/23 0328 05/07/23 0330 05/07/23 0804  BP: 105/65 120/66 120/66 107/68  Pulse: (!) 59 (!) 50 (!) 51 (!) 50  Resp: 18 18 18 18   Temp: 97.9 F (36.6 C) (!) 97.3 F (36.3 C) (!) 97.3 F (36.3 C) 97.6 F (36.4 C)  TempSrc: Oral Oral Oral Oral  SpO2: 99% 100% 100% 99%     There is no height or weight on file to calculate BMI.  Physical Exam   Constitutional: Appears well-developed and well-nourished.  Psych: Affect appropriate to situation.  Eyes: No scleral injection.  HENT: No OP obstrucion.  Head: Normocephalic.  Cardiovascular: Normal rate and regular rhythm.  Respiratory: Effort normal, non-labored breathing.  GI: Soft.  No distension. There is no tenderness.  Skin: WDI.   Neurologic Examination   Mental Status -  Level of arousal and orientation to time, place, and person were intact. Language including expression, naming, repetition, comprehension was assessed and found intact. Attention span and concentration were normal. Recent and remote memory were intact. Fund of Knowledge was assessed and was intact.  Cranial Nerves II - XII - II - Visual field intact OU. III, IV, VI - Extraocular movements intact. V - Facial sensation intact bilaterally. VII - Facial movement intact bilaterally. Has a  chronic left facial twitch  VIII - Hearing & vestibular intact bilaterally. X - Palate elevates symmetrically. XI - Chin turning & shoulder shrug intact bilaterally. XII - Tongue protrusion intact.  Motor Strength - [name] strength was normal in all extremities and pronator drift was absent.  Bulk was normal and fasciculations were absent.   Motor Tone - Muscle tone was assessed at the neck and appendages and was normal. Sensory - bilateral neuropathy on lower half of legs  Coordination - The patient had normal movements in the hands and feet with no ataxia or dysmetria.  Tremor was absent. Gait and Station - deferred.  Medications  Current Facility-Administered Medications:    acetaminophen (TYLENOL) tablet 650 mg, 650 mg, Oral, Q4H PRN **OR** acetaminophen (TYLENOL) suppository 650 mg, 650 mg, Rectal, Q4H PRN, Jonah Blue, MD   ALPRAZolam Prudy Feeler) tablet 1 mg, 1 mg, Oral, BID PRN, Jonah Blue, MD   diltiazem (CARDIZEM CD) 24 hr capsule 240 mg, 240 mg, Oral, Daily, Jonah Blue, MD   diphenhydrAMINE (BENADRYL) capsule 25 mg, 25 mg, Oral, Q6H PRN **OR** diphenhydrAMINE (BENADRYL) injection 50 mg, 50 mg, Intramuscular, Q6H PRN, Starkes-Perry, Juel Burrow, FNP   enoxaparin (LOVENOX) injection 40 mg, 40 mg, Subcutaneous, Q24H, Jonah Blue, MD, 40 mg at 05/06/23 1832   hydrALAZINE (APRESOLINE) injection 5 mg, 5 mg, Intravenous, Q4H PRN, Jonah Blue, MD   levETIRAcetam (KEPPRA) tablet 1,000 mg, 1,000  mg, Oral, BID, Jonah Blue, MD, 1,000 mg at 05/06/23 2101   LORazepam (ATIVAN) injection 4 mg, 4 mg, Intravenous, Q5 Min x 2 PRN, Jonah Blue, MD   ondansetron [facility]) tablet 4 mg, 4 mg, Oral, Q6H PRN **OR** ondansetron (ZOFRAN) injection 4 mg, 4 mg, Intravenous, Q6H PRN, Jonah Blue, MD   Oxcarbazepine (TRILEPTAL) tablet 300 mg, 300 mg, Oral, BID, Jonah Blue, MD, 300 mg at 05/06/23 2101   pantoprazole (PROTONIX) EC tablet 40 mg, 40 mg, Oral, QAC breakfast,  Jonah Blue, MD, 40 mg at 05/07/23 0600   pregabalin (LYRICA) capsule 200 mg, 200 mg, Oral, TID, Jonah Blue, MD, 200 mg at 05/07/23 0039   sodium chloride flush (NS) 0.9 % injection 3 mL, 3 mL, Intravenous, Q12H, Jonah Blue, MD, 3 mL at 05/06/23 2250  Labs and Diagnostic Imaging   CBC:  Recent Labs  Lab 05/01/23 1540 05/02/23 0900 05/03/23 0730 05/07/23 0633  WBC 5.4   < > 4.3 7.2  NEUTROABS 4.4  --   --   --   HGB 14.8   < > 13.8 15.8  HCT 42.2   < > 39.6 46.4  MCV 90.2   < > 91.0 92.6  PLT 123*   < > 94* 144*   < > = values in this interval not displayed.    Basic Metabolic Panel:  Lab Results  Component Value Date   NA 136 05/07/2023   K 3.4 (L) 05/07/2023   CO2 24 05/07/2023   GLUCOSE 90 05/07/2023   BUN 13 05/07/2023   CREATININE 0.98 05/07/2023   CALCIUM 9.3 05/07/2023   GFRNONAA >60 05/07/2023   GFRAA 82 09/06/2019   Lipid Panel:  Lab Results  Component Value Date   LDLCALC 76 09/05/2022   HgbA1c:  Lab Results  Component Value Date   HGBA1C 4.5 (L) 07/26/2019   Urine Drug Screen:     Component Value Date/Time   LABOPIA NONE DETECTED 05/01/2023 2210   COCAINSCRNUR NONE DETECTED 05/01/2023 2210   LABBENZ NONE DETECTED 05/01/2023 2210   AMPHETMU NONE DETECTED 05/01/2023 2210   THCU POSITIVE (A) 05/01/2023 2210   LABBARB NONE DETECTED 05/01/2023 2210    Alcohol Level     Component Value Date/Time   ETH <10 05/01/2023 1540   INR  Lab Results  Component Value Date   INR 1.1 (H) 04/11/2022   APTT  Lab Results  Component Value Date   APTT 38 (H) 07/27/2019   AED levels: No results found for: "PHENYTOIN", "ZONISAMIDE", "LAMOTRIGINE", "LEVETIRACETA"  LTM EEG:  Ordered   Assessment   MAYSEN SUDOL is a 63 y.o. male with hx of seizures, neuropathy, HTN, hypothyroidism, B12 deficiency, EtOH abuse, cirrhosis, anxiety/depression, colon cancer who presents as an IVC admit from [facility] after presenting there with diagnosed  visit visual hallucinations/psychosis.  Patient reported that a fox was biting his legs/pulling on his pant leg and he came to the hospital to get a rabies shot.     Patient was just discharged from [facility] 4 days ago where he was admitted due to altered mental status and feeling of fogginess.  He was placed on LTM EEG during this admission and 2 seizures were seen.  His Keppra dosage was increased to 1000 twice daily.  His Lyrica dose was previously reduced from 600 to 400 mg.  Patient states that his seizures began after this decrease. Per  patient's Jamse Arn, She endorses that patient has been slightly more agitated over the  past few weeks and even worse over the past few days.  It is possible that his Keppra is causing this as a side effect.  Recommendations   - Seizure precautions  - LTM EEG  - Continue Keppra 750mg  BID  - Neurology will continue to follow  ______________________________________________________________________  Signed, Mathews Argyle, NP [facility]  I have seen the patient and reviewed the above note.  The patient had episodes of confusion and concern for hallucinations/psychosis, though I am not certain of this.  Given these concerns, and the fact that he has had nonconvulsive seizures very recently, I think that repeating an EEG to rule out the possibility of recurrent seizures is not unreasonable.   The patient was initially reluctant to do this, but once I explained the reasoning behind my the test I was recommending he was willing to comply with this.  He is on three antiepileptics, Lyrica, Trileptal, and Keppra but only the latter was added for seizures.  He is on a moderate dose of Trileptal and I would favor increasing his Trileptal and decreasing his Keppra and as long as he remains seizure-free for a few weeks, then I would consider discontinuing Keppra and continuing him on dual therapy with Lyrica and Trileptal.  If he has no seizures on EEG  overnight, then he could be discharged from a neurology standpoint.   He will need outpatient neurology follow-up and has an appointment with [facility] neurology in May.  Ritta Slot, MD [facility]   If 7pm- 7am, please page neurology on call as listed in AMION.

## 2023-05-07 NOTE — Progress Notes (Signed)
   05/06/23 2212  Assess: MEWS Score  BP 114/65  MAP (mmHg) 81  Pulse Rate (!) 55  SpO2 94 %  O2 Device Room Air  Assess: MEWS Score  MEWS Temp 0  MEWS Systolic 0  MEWS Pulse 0  MEWS RR 0  MEWS LOC 0  MEWS Score 0  MEWS Score Color Green  Provider Notification  Provider Name/Title Midge Minium, Md  Date Provider Notified 05/06/23  Time Provider Notified 2213  Method of Notification  (secure chat)  Notification Reason Other (Comment) (repeat Hr sinus brady 55, bp 114/65)  Provider response Other (Comment) (per MD to hold night dose po diltiazem)  Date of Provider Response 05/06/23  Time of Provider Response 2215  Assess: SIRS CRITERIA  SIRS Temperature  0  SIRS Respirations  0  SIRS Pulse 0  SIRS WBC 0  SIRS Score Sum  0

## 2023-05-07 NOTE — H&P (Deleted)
Please disregard this note, created in error  

## 2023-05-08 ENCOUNTER — Inpatient Hospital Stay (HOSPITAL_COMMUNITY): Payer: MEDICAID

## 2023-05-08 ENCOUNTER — Ambulatory Visit: Payer: MEDICAID | Admitting: Gastroenterology

## 2023-05-08 ENCOUNTER — Encounter (HOSPITAL_COMMUNITY): Payer: Self-pay | Admitting: Internal Medicine

## 2023-05-08 DIAGNOSIS — R569 Unspecified convulsions: Secondary | ICD-10-CM | POA: Diagnosis not present

## 2023-05-08 DIAGNOSIS — R441 Visual hallucinations: Secondary | ICD-10-CM | POA: Diagnosis not present

## 2023-05-08 LAB — CBC WITH DIFFERENTIAL/PLATELET
Abs Immature Granulocytes: 0.04 10*3/uL (ref 0.00–0.07)
Basophils Absolute: 0 10*3/uL (ref 0.0–0.1)
Basophils Relative: 0 %
Eosinophils Absolute: 0.1 10*3/uL (ref 0.0–0.5)
Eosinophils Relative: 2 %
HCT: 41.3 % (ref 39.0–52.0)
Hemoglobin: 14.5 g/dL (ref 13.0–17.0)
Immature Granulocytes: 1 %
Lymphocytes Relative: 29 %
Lymphs Abs: 1.6 10*3/uL (ref 0.7–4.0)
MCH: 31.5 pg (ref 26.0–34.0)
MCHC: 35.1 g/dL (ref 30.0–36.0)
MCV: 89.6 fL (ref 80.0–100.0)
Monocytes Absolute: 0.6 10*3/uL (ref 0.1–1.0)
Monocytes Relative: 11 %
Neutro Abs: 3.1 10*3/uL (ref 1.7–7.7)
Neutrophils Relative %: 57 %
Platelets: 89 10*3/uL — ABNORMAL LOW (ref 150–400)
RBC: 4.61 MIL/uL (ref 4.22–5.81)
RDW: 13.2 % (ref 11.5–15.5)
WBC: 5.4 10*3/uL (ref 4.0–10.5)
nRBC: 0 % (ref 0.0–0.2)

## 2023-05-08 LAB — BASIC METABOLIC PANEL WITH GFR
Anion gap: 7 (ref 5–15)
BUN: 12 mg/dL (ref 8–23)
CO2: 27 mmol/L (ref 22–32)
Calcium: 8.7 mg/dL — ABNORMAL LOW (ref 8.9–10.3)
Chloride: 106 mmol/L (ref 98–111)
Creatinine, Ser: 0.91 mg/dL (ref 0.61–1.24)
GFR, Estimated: >60 mL/min (ref >60)
Glucose, Bld: 88 mg/dL (ref 70–99)
Potassium: 3.6 mmol/L (ref 3.5–5.1)
Sodium: 140 mmol/L (ref 135–145)

## 2023-05-08 LAB — VITAMIN B1: Vitamin B1 (Thiamine): 133.8 nmol/L (ref 66.5–200.0)

## 2023-05-08 MED ORDER — LEVETIRACETAM 750 MG PO TABS: 750.0000 mg | ORAL_TABLET | Freq: Two times a day (BID) | ORAL | 1 refills | Status: DC

## 2023-05-08 MED ORDER — OXCARBAZEPINE 600 MG PO TABS: 600.0000 mg | ORAL_TABLET | Freq: Two times a day (BID) | ORAL | 2 refills | Status: DC

## 2023-05-08 MED ORDER — VITAMIN B-1 100 MG PO TABS: 100.0000 mg | ORAL_TABLET | Freq: Every day | ORAL | 0 refills | Status: AC

## 2023-05-08 NOTE — Discharge Summary (Signed)
 Physician Discharge Summary  Earl [name]:[id number] DOB: November 10, 1960 DOA: 05/06/2023  PCP: [name], FNP  Admit date: 05/06/2023 Discharge date: 05/08/2023  Time spent: 46 minutes  Recommendations for Outpatient Follow-up:  Needs outpatient wean off of Keppra per neurology and should keep appointment Needs B12 CBC Chem-12 as an outpatient Recommend close follow-up  Discharge Diagnoses:  MAIN problem for hospitalization   Hallucinations and possible seizures from side effects of Keppra dosing   Please see below for itemized issues addressed in HOpsital- refer to other progress notes for clarity if needed  Discharge Condition: Improved  Diet recommendation: Heart healthy  There were no vitals filed for this visit.  History of present illness:  63 year old white male B12 deficiency hypothyroidism Alcoholic liver disease with cirrhosis-seizures supposed to be on Keppra Chronic pain peripheral neuropathy on Lyrica Recent hospitalization 4/4-05/03/18/2025 self tapering Lyrica 600-400-developed left-sided facial spasm-had long-term EEG  later admitted to having ongoing facial spasm for 5 to 6 years Keppra was increased to thousand twice daily he was   4/10 return to ED  from [facility] ED [place] rambling paranoid left eye twitching right thigh bruising and abrasion-had visual hallucinations reported Foxes on the porch 1 biting him-- chronic left eyelid twitch worsened Neurology saw the patient, cut back the Keppra to 750 twice daily and recommended LTM EEG to rule out focal sz  he was IVC on admission and psychiatry was consulted  Hospital Course:  Focal seizure versus visual hallucinations secondary to either seizure disorder versus/side effect Keppra Long-term EEG performed and did not show any seizures I discussed with neurology directing management-c they recommend Keppra as time 750 twice daily and outpatient weaning off of the same with his neurologist Dr.Sater and he  has an appointment 06/11/2023 and he should keep that appointment His Lyrica dose should remain the same at 200 3 times daily His Trileptal was increased to 600 twice daily and he should be maintained on this and get labs in about a week   ?  Psychosis-he is IVC Unclear if this is secondary to medication effect versus not-patient's IVC was rescinded by psychiatry it was felt that he was not psychotic It was felt that this was side effect of the various meds   EtOH related cirrhosis Off of beta-blocker since last hospital stay-no asterixis and no concern for withdrawal He has appointment with outpatient GI Can continue Xanax 1 mg twice daily as needed anxiety   Palpitations Continue Cardizem 240 down from 300 He sometimes feels dizzy and this should be taken into account in the outpatient setting when he follows up   Tinea cruris Continue topical clotrimazole   Chronic pain and neuropathy See above dosing of meds      Discharge Exam: Vitals:   05/08/23 0349 05/08/23 0747  BP: (!) 141/88 116/61  Pulse: 75 69  Resp: 19   Temp: 97.8 F (36.6 C) 97.6 F (36.4 C)  SpO2: 99% 100%    Subj on day of d/c   Awake current alert no distress looks fair feels well no seizures no chills no nausea no vomiting  General Exam on discharge  EOMI NCAT no focal deficit power 5/5 no asterixis Chest clear S1-S2 no murmur Abdomen soft no rebound no guarding ROM intact moving 4 limbs equally Sensory grossly intact  Discharge Instructions   Discharge Instructions     Diet - low sodium heart healthy   Complete by: As directed    Discharge instructions   Complete by:  As directed    You came into the hospital potentially with side effects of some of your antiepileptic medications and it would be a good idea to cut back your Keppra dose to 750 twice a day and eventually stop it under the guidance of your neurologist You will notice that we have kept your Lyrica the same as we do not want  to make too many changes at the same time We have increased your Trileptal to 600 twice a day and continued you on B1 Please follow-up with your neurologist in the outpatient setting if you have any further issues with weakness dizziness blurred vision double vision or hallucinations come right back to the hospital   Increase activity slowly   Complete by: As directed       Allergies as of 05/08/2023       Reactions   Amitriptyline Anaphylaxis   Amoxicillin Anaphylaxis, Rash   Asa [aspirin] Anaphylaxis   Penicillin G Anaphylaxis   Almecillin    Clonidine Derivatives Other (See Comments)   Severe Dry Mouth   Hydrochlorothiazide    Caused rectal bleeding and hematuria   Levothyroxine Nausea And Vomiting   Patient states that the 75 mcg is too strong for him.   Trintellix [vortioxetine] Nausea And Vomiting        Medication List     TAKE these medications    ALPRAZolam 1 MG tablet Commonly known as: XANAX TAKE 1 TABLET BY MOUTH DAILY AS NEEDED FOR ANXIETY. Must last 30 DAYS.   clotrimazole 1 % cream Commonly known as: LOTRIMIN Apply topically 2 (two) times daily.   cyanocobalamin 1000 MCG/ML injection Commonly known as: VITAMIN B12 Inject 1 mL (1,000 mcg total) into the muscle every 30 (thirty) days.   diltiazem 240 MG 24 hr capsule Commonly known as: CARDIZEM CD Take 1 capsule (240 mg total) by mouth daily.   feeding supplement Liqd Take 237 mLs by mouth 3 (three) times daily between meals. What changed: when to take this   levETIRAcetam 750 MG tablet Commonly known as: KEPPRA Take 1 tablet (750 mg total) by mouth 2 (two) times daily. What changed:  medication strength how much to take   ondansetron 4 MG tablet Commonly known as: ZOFRAN TAKE 1 TABLET BY MOUTH TWICE DAILY AS NEEDED FOR NAUSEA OR FOR VOMITING   oxcarbazepine 600 MG tablet Commonly known as: Trileptal Take 1 tablet (600 mg total) by mouth 2 (two) times daily. What changed:  medication  strength how much to take   pantoprazole 40 MG tablet Commonly known as: PROTONIX Take 1 tablet (40 mg total) by mouth daily before breakfast. What changed:  when to take this reasons to take this   pregabalin 200 MG capsule Commonly known as: LYRICA Take 1 capsule (200 mg total) by mouth in the morning, at noon, and at bedtime. Take 3 capsules (600 mg) po daily.    Prescription must last 30 or more days. What changed: additional instructions   thiamine 100 MG tablet Commonly known as: Vitamin B-1 Take 1 tablet (100 mg total) by mouth daily.       Allergies  Allergen Reactions   Amitriptyline Anaphylaxis   Amoxicillin Anaphylaxis and Rash   Asa [Aspirin] Anaphylaxis   Penicillin G Anaphylaxis   Almecillin    Clonidine Derivatives Other (See Comments)    Severe Dry Mouth   Hydrochlorothiazide     Caused rectal bleeding and hematuria   Levothyroxine Nausea And Vomiting    Patient states  that the 75 mcg is too strong for him.   Trintellix [Vortioxetine] Nausea And Vomiting      The results of significant diagnostics from this hospitalization (including imaging, microbiology, ancillary and laboratory) are listed below for reference.    Significant Diagnostic Studies: Overnight EEG with video Result Date: 05/08/2023 Charlsie Quest, MD     05/08/2023  5:59 AM Patient Name: Earl [name] MRN: [id number] Epilepsy Attending: Charlsie Quest Referring Physician/Provider: Lynnae January, NP Duration: 05/07/2023 1553 to 05/08/2023 0600 Patient history: 63 y.o. male with hx of seizures, neuropathy, HTN, hypothyroidism, B12 deficiency, EtOH abuse, cirrhosis, anxiety/depression, colon cancer who presents as an IVC admit from [facility] after presenting there with diagnosed visit visual hallucinations/psychosis.  Patient reported that a fox was biting his legs/pulling on his pant leg and he came to the hospital to get a rabies shot.  EEG to evaluate for seizure Level of alertness:  Awake, asleep AEDs during EEG study: LEV, OXC, PGB Technical aspects: This EEG study was done with scalp electrodes positioned according to the 10-20 International system of electrode placement. Electrical activity was reviewed with band pass filter of 1-70Hz , sensitivity of 7 uV/mm, display speed of 78mm/sec with a 60Hz  notched filter applied as appropriate. EEG data were recorded continuously and digitally stored.  Video monitoring was available and reviewed as appropriate. Description: The posterior dominant rhythm consists of 9 Hz activity of moderate voltage (25-35 uV) seen predominantly in posterior head regions, symmetric and reactive to eye opening and eye closing. Sleep was characterized by vertex waves, sleep spindles (12 to 14 Hz), maximal frontocentral region.  Patient was noted to have episodes of left facial twitching intermittently. Concomitant EEG before, during and after the event showed normal posterior dominant rhythm and did not show any EEG changes suggest seizure. Hyperventilation and photic stimulation were not performed.   IMPRESSION: This study is within normal limits. No seizures or epileptiform discharges were seen throughout the recording. Patient was noted to have episodes of left facial twitching intermittently without concomitant EEG change. These events were non epileptic. A normal interictal EEG does not exclude the diagnosis of epilepsy. Priyanka Annabelle Harman   Overnight EEG with video Result Date: 05/02/2023 Charlsie Quest, MD     05/03/2023  8:13 AM Patient Name: DEMORRIS CHOYCE MRN: [id number] Epilepsy Attending: Charlsie Quest Referring Physician/Provider: Milon Dikes, MD Duration: 05/02/2023 0210 to 05/03/2023 0210 Patient history: 63 y.o. male with past history of B12 deficiency, hypothyroidism, alcohol abuse, cirrhosis, seizures on Keppra, peripheral neuropathy and chronic hypertension presents for evaluation of altered mental status and concern for staring spells. EEG to  evaluate for seizure Level of alertness: Awake, asleep AEDs during EEG study: LEV, OXC, PGB Technical aspects: This EEG study was done with scalp electrodes positioned according to the 10-20 International system of electrode placement. Electrical activity was reviewed with band pass filter of 1-70Hz , sensitivity of 7 uV/mm, display speed of 17mm/sec with a 60Hz  notched filter applied as appropriate. EEG data were recorded continuously and digitally stored.  Video monitoring was available and reviewed as appropriate. Description: The posterior dominant rhythm consists of 9 Hz activity of moderate voltage (25-35 uV) seen predominantly in posterior head regions, symmetric and reactive to eye opening and eye closing. Sleep was characterized by vertex waves, sleep spindles (12 to 14 Hz), maximal frontocentral region. EEG showed near continuous, at times rhythmic sharply contoured 3 to 6 Hz theta-delta slowing in left fronto-parietal region with waxing and waning  morphology Seizure was noted on 05/02/2023 at 0210 arising from left fronto-parietal region. During seizure, no clinical signs were noted. EEG showed sharply contoured 5-6Hz  theta slowing in left fronto-parietal region admixed with sharp waves which gradually evolved into 2-3hz  delta slowing and involved all of left hemisphere. Duration of seizure was about 9 minutes ( of note eeg was connected at 0210 and patient was already seizing at the time so duration is likely an underestimate) Seizure was noted on 05/02/2023 at 0338 arising from left fronto-parietal region. During the seizure, patient was noted to be moving around in bed, fumbling with objects. EEG showed sharply contoured 5-6Hz  theta slowing in left fronto-parietal region admixed with sharp waves which gradually evolved into 2-3hz  delta slowing and involved all of left hemisphere. Duration of seizure was about 17 minutes Hyperventilation and photic stimulation were not performed.   ABNORMALITY - Focal  seizure, left fronto-parietal region - Continuous slow,  left fronto-parietal region IMPRESSION: This study showed two seizures as described above arising from  left fronto-parietal region, on  05/02/2023 at 0210 and 0338, lasting about 9 minutes and 17 minutes. Additionally there is cortical dysfunction arising from left fronto-parietal region likely secondary to underlying structural abnormality, post-ictal state. Dr Wilford Corner was notified. Charlsie Quest   CT Head Wo Contrast Result Date: 05/01/2023 CLINICAL DATA:  Altered mental status EXAM: CT HEAD WITHOUT CONTRAST TECHNIQUE: Contiguous axial images were obtained from the base of the skull through the vertex without intravenous contrast. RADIATION DOSE REDUCTION: This exam was performed according to the departmental dose-optimization program which includes automated exposure control, adjustment of the mA and/or kV according to patient size and/or use of iterative reconstruction technique. COMPARISON:  CT head 11/25/2021. FINDINGS: Brain: No evidence of acute infarction, hemorrhage, hydrocephalus, extra-axial collection or mass lesion/mass effect. Vascular: No hyperdense vessel or unexpected calcification. Skull: Normal. Negative for fracture or focal lesion. Sinuses/Orbits: No acute finding. Other: None. IMPRESSION: No acute intracranial abnormality. Electronically Signed   By: Darliss Cheney M.D.   On: 05/01/2023 18:02    Microbiology: No results found for this or any previous visit (from the past 240 hours).   Labs: Basic Metabolic Panel: Recent Labs  Lab 05/01/23 1540 05/02/23 0900 05/03/23 0730 05/07/23 0633 05/08/23 0732  NA 140 141 141 136 140  K 3.3* 3.1* 3.4* 3.4* 3.6  CL 106 106 106 102 106  CO2 23 24 27 24 27   GLUCOSE 112* 65* 84 90 88  BUN 21 16 17 13 12   CREATININE 0.85 1.01 1.06 0.98 0.91  CALCIUM 9.3 9.2 8.6* 9.3 8.7*  MG 2.0 2.1  --   --   --   PHOS  --  3.6  --   --   --    Liver Function Tests: Recent Labs  Lab  05/01/23 1540  AST 27  ALT 27  ALKPHOS 94  BILITOT 1.3*  PROT 7.3  ALBUMIN 4.2   No results for input(s): "LIPASE", "AMYLASE" in the last 168 hours. Recent Labs  Lab 05/01/23 1542 05/07/23 1127  AMMONIA 25 29   CBC: Recent Labs  Lab 05/01/23 1540 05/02/23 0900 05/03/23 0730 05/07/23 0633  WBC 5.4 5.0 4.3 7.2  NEUTROABS 4.4  --   --   --   HGB 14.8 14.2 13.8 15.8  HCT 42.2 41.7 39.6 46.4  MCV 90.2 91.6 91.0 92.6  PLT 123* 104* 94* 144*   Cardiac Enzymes: No results for input(s): "CKTOTAL", "CKMB", "CKMBINDEX", "TROPONINI" in the last 168 hours.  BNP: BNP (last 3 results) No results for input(s): "BNP" in the last 8760 hours.  ProBNP (last 3 results) No results for input(s): "PROBNP" in the last 8760 hours.  CBG: Recent Labs  Lab 05/02/23 2013 05/02/23 2353 05/03/23 0436 05/03/23 0804 05/03/23 1142  GLUCAP 118* 115* 85 85 108*    Signed:  Rhetta Mura MD   [facility] 05/08/2023, 8:51 AM

## 2023-05-08 NOTE — Progress Notes (Signed)
 AVS printed, EEG needs to complete before patient can discharge.

## 2023-05-08 NOTE — Plan of Care (Signed)

## 2023-05-08 NOTE — Progress Notes (Signed)
 LTM EEG discontinued - no skin breakdown at [facility].

## 2023-05-08 NOTE — Procedures (Addendum)
 Patient Name: Earl [name]  MRN: [id number]  Epilepsy Attending: Arleene Lack  Referring Physician/Provider: Audrene Lease, NP  Duration: 05/07/2023 1553 to 05/08/2023 1039  Patient history: 63 y.o. male with hx of seizures, neuropathy, HTN, hypothyroidism, B12 deficiency, EtOH abuse, cirrhosis, anxiety/depression, colon cancer who presents as an IVC admit from [facility] after presenting there with diagnosed visit visual hallucinations/psychosis.  Patient reported that a fox was biting his legs/pulling on his pant leg and he came to the hospital to get a rabies shot.  EEG to evaluate for seizure  Level of alertness: Awake, asleep  AEDs during EEG study: LEV, OXC, PGB  Technical aspects: This EEG study was done with scalp electrodes positioned according to the 10-20 International system of electrode placement. Electrical activity was reviewed with band pass filter of 1-70Hz , sensitivity of 7 uV/mm, display speed of 41mm/sec with a 60Hz  notched filter applied as appropriate. EEG data were recorded continuously and digitally stored.  Video monitoring was available and reviewed as appropriate.  Description: The posterior dominant rhythm consists of 9 Hz activity of moderate voltage (25-35 uV) seen predominantly in posterior head regions, symmetric and reactive to eye opening and eye closing. Sleep was characterized by vertex waves, sleep spindles (12 to 14 Hz), maximal frontocentral region.    Patient was noted to have episodes of left facial twitching intermittently. Concomitant EEG before, during and after the event showed normal posterior dominant rhythm and did not show any EEG changes suggest seizure.  Hyperventilation and photic stimulation were not performed.     IMPRESSION: This study is within normal limits. No seizures or epileptiform discharges were seen throughout the recording.  Patient was noted to have episodes of left facial twitching intermittently without concomitant  EEG change. These events were non epileptic.   A normal interictal EEG does not exclude the diagnosis of epilepsy.   Alexandre Faries O Hurman Ketelsen

## 2023-05-11 ENCOUNTER — Ambulatory Visit: Payer: MEDICAID | Admitting: Gastroenterology

## 2023-05-21 ENCOUNTER — Telehealth: Payer: Self-pay | Admitting: Gastroenterology

## 2023-05-21 NOTE — Telephone Encounter (Signed)
 Good afternoon Dr. Venice Gillis  The following patient is currently under your care and he has called to ask to have his records reviewed to find out if it is time for him to have another colonoscopy. His last one was in 2019 and done at [facility]. [facility] only did his EGD. He is asking to have them done at the same time. He is also under the impression he's supposed to have blood work done as well. Please review and advise of scheduling. Thank you.

## 2023-05-25 NOTE — Telephone Encounter (Signed)
 Patient scheduled for O/V 5/9 WITH PA to discuss colon/egd/labs.

## 2023-05-27 ENCOUNTER — Telehealth: Payer: Self-pay | Admitting: Neurology

## 2023-05-27 NOTE — Telephone Encounter (Signed)
 Pt called to verify appointment

## 2023-05-28 ENCOUNTER — Telehealth: Payer: Self-pay | Admitting: Cardiovascular Disease

## 2023-05-28 NOTE — Telephone Encounter (Signed)
 Patient calling in to see when he was diagnose with tachycardia. Please advise

## 2023-05-28 NOTE — Telephone Encounter (Signed)
 Spoke with pt and advised I see documentation 07/31/2016 at the time of his ablation by Dr Nunzio Belch mention of tachycardia and possible At Fib.  Pt states that is all he needs and was appreciative of the call back and information

## 2023-05-29 ENCOUNTER — Encounter: Payer: Medicaid Other | Admitting: Cardiovascular Disease

## 2023-06-02 ENCOUNTER — Other Ambulatory Visit: Payer: Self-pay | Admitting: Neurology

## 2023-06-02 NOTE — Telephone Encounter (Signed)
 Last seen on 12/29/22 Follow up scheduled on 06/11/23   Dispensed Days Supply Quantity Provider Pharmacy  ALPRAZOLAM    TAB 1MG  05/28/2023 15 15 each Sater, Sherida Dimmer, MD [facility] [place], ...      I called [facility] to confirm and pt has Rx ready for pick up now.   This refill request is for future refill.  Rx pending to be signed

## 2023-06-05 ENCOUNTER — Other Ambulatory Visit (INDEPENDENT_AMBULATORY_CARE_PROVIDER_SITE_OTHER): Payer: MEDICAID

## 2023-06-05 ENCOUNTER — Encounter: Payer: Self-pay | Admitting: Physician Assistant

## 2023-06-05 ENCOUNTER — Ambulatory Visit (INDEPENDENT_AMBULATORY_CARE_PROVIDER_SITE_OTHER): Payer: MEDICAID | Admitting: Physician Assistant

## 2023-06-05 DIAGNOSIS — Z85038 Personal history of other malignant neoplasm of large intestine: Secondary | ICD-10-CM

## 2023-06-05 DIAGNOSIS — I4891 Unspecified atrial fibrillation: Secondary | ICD-10-CM

## 2023-06-05 DIAGNOSIS — K7031 Alcoholic cirrhosis of liver with ascites: Secondary | ICD-10-CM

## 2023-06-05 DIAGNOSIS — E538 Deficiency of other specified B group vitamins: Secondary | ICD-10-CM

## 2023-06-05 DIAGNOSIS — F1091 Alcohol use, unspecified, in remission: Secondary | ICD-10-CM | POA: Diagnosis not present

## 2023-06-05 DIAGNOSIS — I8510 Secondary esophageal varices without bleeding: Secondary | ICD-10-CM | POA: Diagnosis not present

## 2023-06-05 LAB — CBC WITH DIFFERENTIAL/PLATELET
Basophils Absolute: 0.1 10*3/uL (ref 0.0–0.1)
Basophils Relative: 1.1 % (ref 0.0–3.0)
Eosinophils Absolute: 0.1 10*3/uL (ref 0.0–0.7)
Eosinophils Relative: 2.5 % (ref 0.0–5.0)
HCT: 43 % (ref 39.0–52.0)
Hemoglobin: 14.4 g/dL (ref 13.0–17.0)
Lymphocytes Relative: 28.1 % (ref 12.0–46.0)
Lymphs Abs: 1.4 10*3/uL (ref 0.7–4.0)
MCHC: 33.5 g/dL (ref 30.0–36.0)
MCV: 95.4 fl (ref 78.0–100.0)
Monocytes Absolute: 0.4 10*3/uL (ref 0.1–1.0)
Monocytes Relative: 8.4 % (ref 3.0–12.0)
Neutro Abs: 2.9 10*3/uL (ref 1.4–7.7)
Neutrophils Relative %: 59.9 % (ref 43.0–77.0)
Platelets: 109 10*3/uL — ABNORMAL LOW (ref 150.0–400.0)
RBC: 4.51 Mil/uL (ref 4.22–5.81)
RDW: 13.7 % (ref 11.5–15.5)
WBC: 4.9 10*3/uL (ref 4.0–10.5)

## 2023-06-05 LAB — COMPREHENSIVE METABOLIC PANEL WITH GFR
ALT: 16 U/L (ref 0–53)
AST: 19 U/L (ref 0–37)
Albumin: 4 g/dL (ref 3.5–5.2)
Alkaline Phosphatase: 123 U/L — ABNORMAL HIGH (ref 39–117)
BUN: 19 mg/dL (ref 6–23)
CO2: 29 meq/L (ref 19–32)
Calcium: 8.7 mg/dL (ref 8.4–10.5)
Chloride: 108 meq/L (ref 96–112)
Creatinine, Ser: 0.7 mg/dL (ref 0.40–1.50)
GFR: 97.59 mL/min (ref >60.00)
Glucose, Bld: 87 mg/dL (ref 70–99)
Potassium: 4.8 meq/L (ref 3.5–5.1)
Sodium: 142 meq/L (ref 135–145)
Total Bilirubin: 0.4 mg/dL (ref 0.2–1.2)
Total Protein: 6.7 g/dL (ref 6.0–8.3)

## 2023-06-05 LAB — PROTIME-INR
INR: 1.3 ratio — ABNORMAL HIGH (ref 0.8–1.0)
Prothrombin Time: 13.2 s — ABNORMAL HIGH (ref 9.6–13.1)

## 2023-06-05 NOTE — Progress Notes (Signed)
 06/05/2023 Earl [name] [id number] Nov 17, 1960  Referring provider: Yevette Hem, FNP Primary GI doctor: Dr. Venice Gillis  ASSESSMENT AND PLAN:   Cirrhosis 05/08/2023 WBC 5.4 HGB 14.5 Platelets 89 05/01/2023 AST 27 ALT 27 Alkphos 94 TBili 1.3 04/11/2022 INR 1.1 Will get INR to calculate  Serologic workup: 2019, 2020 negative ANA, hepatitis panel, ferritin, will get complete work up but likely from ETOH which patient has stopped Ascites:      Last LVP 06/2019 no SB -Nutrition and low sodium diet discussed with patient and information given - appears euvolemic - he is not on medications Varices screening / surveillance EGD:     Last EGD 05/08/2022  A small scar was found in the lower third of the esophagus from previous EVL. Grade 0- I varices were found in the lower third of the esophagus. Mild portal hypertensive gastropathy was found in the entire examined stomach. Biopsies were taken with a cold forceps for histology. No fundal varices. Incidental note was made of 2- 3 small 4 to 6 mm gastric polyps ( left alone, previously deemed to be hyperplastic) No history of varices. NOT prophylaxis, will schedule for repeat EGD Hepatic encephalopathy:  Pt does not report any symptoms consistent with HE and no asterixis on exam.  Most recent HCC screening:    12/26/2022 MR liver with and without contrast Last AFP remote Provided general information to the patient: -Continue daily multivitamin -Recommended 30 minutes of aerobic and resistance exercise 3 days/week, he is walking, lifting  -Encouraged pt to increase protein intake - [facility]  Afib s/p ablation Not on blood thinner  Personal history of colon cancer No hematochezia, no constipation, diarrhea He underwent a screening colonoscopy in 2018 which identified 1 polyp removed from the sigmoid colon, path report identified invasive adenocarcinoma within a tubular adenomatous polyp.  CTAP was  unremarkable and CEA level was normal.  Her last colonoscopy was 07/25/2020 and it showed 2 show 2 sessile polyps 3 to 5 mm in size external/internal hemorrhoids  Recall colonoscopy 06/2025   Patient Care Team: Yevette Hem, FNP as PCP - General (Family Medicine) Mealor, Donnamae Gaba, MD as PCP - Electrophysiology (Cardiology)  HISTORY OF PRESENT ILLNESS: 63 y.o. 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 Has history of the following complications from cirrhosis: esophageal varices, portal hypertensive gastropathy, ascites, thrombocytopenia, and hepatic encephalopathy  Last EGD was 2022 recall 2025.  Last HCC screen: 11/2022 MRI normal Last AFP remote  Last INR: 04/11/2022 1.1  Wt Readings from Last 3 Encounters:  06/05/23 200 lb (90.7 kg)  04/28/23 195 lb (88.5 kg)  12/29/22 222 lb 8 oz (100.9 kg)   Discussed the use of AI scribe software for clinical note transcription with the patient, who gave verbal consent to proceed.  History of Present Illness   Earl [name] "Nadean August" is a 63 year old male with cirrhosis and a history of varices who presents for follow-up and lab work.  He has a history of cirrhosis with varices that were previously banded. He has not experienced recent issues with varices, melena, or hematochezia. His urine appears normal. He was scheduled to visit the transplant center at [facility] in [place] on June 17th but was hospitalized due to a medication adjustment of Lyrica  from 600 mg to 400 mg, resulting in a seven to eight-day hospital stay.  He reports no abdominal swelling and feels significantly improved compared to a year ago,  having lost nine pounds. He engages in regular physical activity, including lifting weights and walking two miles a day, despite experiencing neuropathy. He follows a low sodium diet and has discontinued Lasix  as he feels it is no longer necessary. He abstains from alcohol and is focused on maintaining his liver  health.  He expresses concern about the potential impact of the Moderna COVID-19 vaccine on his liver, noting that his liver failure occurred shortly after receiving the shot. He has no symptoms of hepatic encephalopathy and is not currently taking any medications to manage fluid retention.  He has a history of pernicious anemia and takes B12 shots, although he did not bring his B12 medication to the appointment. He also takes a prescription-strength vitamin D1 once a week. His neuropathy medication causes a sensation of being 'buzzed', necessitating caution to avoid falls.  He has a history of invasive adenocarcinoma found in a colonoscopy in 2018, but his most recent colonoscopy in 2022 showed only sessile polyps and hemorrhoids, with no current symptoms from the hemorrhoids.  He experiences no shortness of breath, chest pain, or significant changes in bowel habits, although he has a lifelong history of constipation that has improved with dietary changes. No diarrhea. 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 He has undergone a heart ablation procedure in the past.     Social history:  He  reports that he quit smoking about 44 years ago. His smoking use included cigarettes. He has never used smokeless tobacco. He reports that he does not currently use alcohol after a past usage of about 8.0 standard drinks of alcohol per week. He reports that he does not use drugs.  RELEVANT GI HISTORY, LABS, IMAGING:  CBC    Component Value Date/Time   WBC 5.4 05/08/2023 0732   RBC 4.61 05/08/2023 0732   HGB 14.5 05/08/2023 0732   HGB 15.0 04/28/2023 1449   HCT 41.3 05/08/2023 0732   HCT 44.0 04/28/2023 1449   PLT 89 (L) 05/08/2023 0732   PLT 136 (L) 04/28/2023 1449   MCV 89.6 05/08/2023 0732   MCV 92 04/28/2023 1449   MCH 31.5 05/08/2023 0732   MCHC 35.1 05/08/2023 0732   RDW 13.2 05/08/2023 0732   RDW 12.3  04/28/2023 1449   LYMPHSABS 1.6 05/08/2023 0732   LYMPHSABS 1.5 04/28/2023 1449   MONOABS 0.6 05/08/2023 0732   EOSABS 0.1 05/08/2023 0732   EOSABS 0.1 04/28/2023 1449   BASOSABS 0.0 05/08/2023 0732   BASOSABS 0.0 04/28/2023 1449   Recent Labs    09/05/22 1217 04/28/23 1449 05/01/23 1540 05/02/23 0900 05/03/23 0730 05/07/23 0633 05/08/23 0732  HGB 14.5 15.0 14.8 14.2 13.8 15.8 14.5    CMP     Component Value Date/Time   NA 140 05/08/2023 0732   NA 141 04/28/2023 1449   K 3.6 05/08/2023 0732   CL 106 05/08/2023 0732   CO2 27 05/08/2023 0732   GLUCOSE 88 05/08/2023 0732   BUN 12 05/08/2023 0732   BUN 13 04/28/2023 1449   CREATININE 0.91 05/08/2023 0732   CREATININE 0.83 07/18/2020 1304   CALCIUM  8.7 (L) 05/08/2023 0732   PROT 7.3 05/01/2023 1540   PROT 6.8 04/28/2023 1449   ALBUMIN 4.2 05/01/2023 1540   ALBUMIN 4.2 04/28/2023 1449   AST 27 05/01/2023 1540   ALT 27 05/01/2023 1540   ALKPHOS 94 05/01/2023 1540   BILITOT 1.3 (H)  05/01/2023 1540   BILITOT 0.8 04/28/2023 1449   GFRNONAA >60 05/08/2023 0732   GFRNONAA 71 09/06/2019 1313   GFRAA 82 09/06/2019 1313      Latest Ref Rng & Units 05/01/2023    3:40 PM 04/28/2023    2:49 PM 09/05/2022   12:17 PM  Hepatic Function  Total Protein 6.5 - 8.1 g/dL 7.3  6.8  7.0   Albumin 3.5 - 5.0 g/dL 4.2  4.2  4.2   AST 15 - 41 U/L 27  31  21    ALT 0 - 44 U/L 27  32  21   Alk Phosphatase 38 - 126 U/L 94  132  109   Total Bilirubin 0.0 - 1.2 mg/dL 1.3  0.8  0.7       Latest Ref Rng & Units 07/11/2022    1:08 PM 10/22/2022    3:17 PM 04/07/2023   10:05 AM  Hepatitis C  AFP <6.1 ng/mL 3.6  3.2  2.5     Current Medications:    Current Outpatient Medications (Cardiovascular):    diltiazem  (CARDIZEM  CD) 240 MG 24 hr capsule, Take 1 capsule (240 mg total) by mouth daily.    Current Outpatient Medications (Hematological):    cyanocobalamin  (VITAMIN B12) 1000 MCG/ML injection, Inject 1 mL (1,000 mcg total) into the muscle  every 30 (thirty) days.  Current Outpatient Medications (Other):    [START ON 06/28/2023] ALPRAZolam  (XANAX ) 1 MG tablet, TAKE 1 TABLET BY MOUTH DAILY AS NEEDED FOR ANXIETY (MUST LAST 30 DAYS)   clotrimazole  (LOTRIMIN ) 1 % cream, Apply topically 2 (two) times daily.   feeding supplement (ENSURE ENLIVE / ENSURE PLUS) LIQD, Take 237 mLs by mouth 3 (three) times daily between meals. (Patient taking differently: Take 237 mLs by mouth daily.)   levETIRAcetam  (KEPPRA ) 750 MG tablet, Take 1 tablet (750 mg total) by mouth 2 (two) times daily.   ondansetron  (ZOFRAN ) 4 MG tablet, TAKE 1 TABLET BY MOUTH TWICE DAILY AS NEEDED FOR NAUSEA OR FOR VOMITING   oxcarbazepine  (TRILEPTAL ) 600 MG tablet, Take 1 tablet (600 mg total) by mouth 2 (two) times daily.   pantoprazole  (PROTONIX ) 40 MG tablet, Take 1 tablet (40 mg total) by mouth daily before breakfast. (Patient taking differently: Take 40 mg by mouth daily as needed (For acid reflux).)   pregabalin  (LYRICA ) 200 MG capsule, Take 1 capsule (200 mg total) by mouth in the morning, at noon, and at bedtime. Take 3 capsules (600 mg) po daily.    Prescription must last 30 or more days. (Patient taking differently: Take 200 mg by mouth in the morning, at noon, and at bedtime.)   thiamine  (VITAMIN B-1) 100 MG tablet, Take 1 tablet (100 mg total) by mouth daily.  Medical History:  Past Medical History:  Diagnosis Date   Alcohol abuse    Anemia    Anxiety    Arthritis    Colon cancer (HCC)    Depression    Dysrhythmia    Gall stones    Heart burn    History of kidney stones    Hyperlipidemia    Hypertension    Hypothyroidism    Liver disease    Pernicious anemia    Tachycardia    Typical atrial flutter (HCC)    Vitamin B12 deficiency    Allergies:  Allergies  Allergen Reactions   Amitriptyline Anaphylaxis   Amoxicillin Anaphylaxis and Rash   Asa [Aspirin] Anaphylaxis   Penicillin G Anaphylaxis   Almecillin  Clonidine Derivatives Other (See  Comments)    Severe Dry Mouth   Hydrochlorothiazide     Caused rectal bleeding and hematuria   Levothyroxine  Nausea And Vomiting    Patient states that the 75 mcg is too strong for him.   Trintellix  [Vortioxetine ] Nausea And Vomiting     Surgical History:  He  has a past surgical history that includes Minor carpal tunnel; Colonoscopy (N/A, 04/17/2016); polypectomy (04/17/2016); A-FLUTTER ABLATION (N/A, 07/31/2016); Colonoscopy (N/A, 05/07/2017); implantable loop recorder placement (01/24/2019); Colonoscopy with propofol  (N/A, 07/25/2020); Esophagogastroduodenoscopy (egd) with propofol  (N/A, 07/25/2020); polypectomy (07/25/2020); esophageal banding (07/25/2020); Esophagogastroduodenoscopy (egd) with propofol  (N/A, 04/11/2021); Esophagogastroduodenoscopy (egd) with propofol  (N/A, 05/08/2022); and biopsy (05/08/2022). Family History:  His family history includes Alzheimer's disease in his mother; Cirrhosis in his father; Coronary artery disease in his father and paternal grandfather; Gallbladder disease in his mother and sister; Heart attack in his father; Heart disease in his father; Hyperlipidemia in his father and mother; Liver disease in his father; Thyroid  disease in his father.  REVIEW OF SYSTEMS  : All other systems reviewed and negative except where noted in the History of Present Illness.  PHYSICAL EXAM: BP 110/72   Pulse (!) 49   Ht 5\' 10"  (1.778 m)   Wt 200 lb (90.7 kg)   BMI 28.70 kg/m  Physical Exam   GENERAL APPEARANCE: Well nourished, in no apparent distress HEENT: No cervical lymphadenopathy, unremarkable thyroid , sclerae anicteric, conjunctiva pink RESPIRATORY: Respiratory effort normal, lungs clear to auscultation bilaterally, BS equal bilateral without rales, rhonchi, wheezing CARDIO: RRR with no MRGs, peripheral pulses intact ABDOMEN: Soft, non distended, active bowel sounds in all 4 quadrants, non-tender, no rebound, no mass appreciated, no ascites RECTAL:  declines MUSCULOSKELETAL: Full ROM, normal gait, without edema SKIN: Dry, intact without rashes or lesions. No jaundice. NEURO: Alert, oriented, no focal deficits, normal cerebellar function PSYCH: Cooperative, normal mood and affect. EXTREMITIES: No edema in lower extremities         Edmonia Gottron, PA-C 3:22 PM

## 2023-06-05 NOTE — Patient Instructions (Addendum)
 _______________________________________________________  If your blood pressure at your visit was 140/90 or greater, please contact your primary care physician to follow up on this.  If you are age 64 or younger, your body mass index should be between 19-25. Your Body mass index is 28.7 kg/m. If this is out of the aformentioned range listed, please consider follow up with your Primary Care Provider.  ________________________________________________________  The [facility] providers would like to encourage you to use MYCHART to communicate with providers for non-urgent requests or questions.  Due to long hold times on the telephone, sending your provider a message by [facility] may be a faster and more efficient way to get a response.  Please allow 48 business hours for a response.  Please remember that this is for non-urgent requests.  _______________________________________________________  Elene Griffes have been scheduled for an endoscopy. Please follow written instructions given to you at your visit today.  If you use inhalers (even only as needed), please bring them with you on the day of your procedure.  If you take any of the following medications, they will need to be adjusted prior to your procedure:   DO NOT TAKE 7 DAYS PRIOR TO TEST- Trulicity (dulaglutide) Ozempic, Wegovy (semaglutide) Mounjaro (tirzepatide) Bydureon Bcise (exanatide extended release)  DO NOT TAKE 1 DAY PRIOR TO YOUR TEST Rybelsus (semaglutide) Adlyxin (lixisenatide) Victoza (liraglutide) Byetta (exanatide) ___________________________________________________________________________  Your provider has requested that you go to the basement level for lab work before leaving today. Press "B" on the elevator. The lab is located at the first door on the left as you exit the elevator.  You have been scheduled for an abdominal ultrasound at [facility] (1st floor of hospital) on 06-17-23 at 9:30 am. Please arrive 30  minutes prior to your appointment for registration. Make certain not to have anything to eat or drink after midnight the night prior to your appointment. Should you need to reschedule your appointment, please contact radiology at [phone]. This test typically takes about 30 minutes to perform.  Due to recent changes in healthcare laws, you may see the results of your imaging and laboratory studies on MyChart before your provider has had a chance to review them.  We understand that in some cases there may be results that are confusing or concerning to you. Not all laboratory results come back in the same time frame and the provider may be waiting for multiple results in order to interpret others.  Please give us  48 hours in order for your provider to thoroughly review all the results before contacting the office for clarification of your results.    TYLENOL  (ACETAMINOPHEN ) IS SAFE IN LIVER DISEASE: You can take tylenol  (acetaminophen ) up to 2,000 mg/day. This would be four extra strength (500mg ) tablets over 24 hours OR six regular strength (325 mg) tablets over 24 hours. Please be sure to read the ingredients of over the counter medications and prescription pain medications as many contain acetaminophen .   NO NSAIDS (ibuprofen, advil, naproxen, aleve, motrin...)  HEPATIC ENCEPHALOPATHY HEPATIC ENCEPHALOPATHY: Confusion caused by a build up of toxins in the blood due to the liver not being able to filter toxins. This can cause confusion mild or severe, increase falls. If you have been diagnosed with Hepatic encephalopathy, advised to not drive due to increased risk   LACTULOSE :  helps pull ammonia and other toxins from the blood into your stool when you have a bowel movement NO NEED TO CHECK AMMONIA LEVEL IN BLOOD! Only need to  check if you are having symptoms. 30-4ml up to four times a day Take a dose in the morning If by lunch time you have not had AT LEAST 2 bowel movements take another  dose If by dinner you have not had AT LEAST 2 bowel movements that day take another dose If by bedtime you still have not had 2 bowel movements take another dose Goal of 3-4 bowel movements per day Avoid taking with food as this will cause more gas  IF YOU ARE VERY SLEEPY, HARD FOR YOUR FAMILY TO WAKE YOU UP, OR FALLING ASLEEP DURING CONVERSATIONS -INCREASE LACTULOSE /GO TO THE EMERGENCY ROOM  PHYSICAL ACTIVITY It is important to continue to be active when you have cirrhosis. Exercise will help reduce muscle loss and weakness.  DISCUSS REFERRAL FOR PHYSICAL THERAPY WITH YOUR PRIMARY CARE PROVIDER  DIET/NUTRITION FOR CIRRHOSIS NO ALCOHOL YOUR GOALS Evening snack - high protein Supplements between meals to help meet calorie and protein goal: Boost Ensure Premier Protein Shakes Protein Greek yogurt Fish, chicken (NO RAW OR UNDERCOOKED FISH/SHELLFISH) Avoid pork and red meat Plant based protein (non-soy)/Vegan: Lentils, Chickpeas, Peanuts (non salted), almonds (non salted), quinoa, chia seeds  Plant based protein supplements (not soy)  Avoid/limit animal based protein supplements: whey, casein 4.  Low sodium (2,000 mg/day) A. Avoid: table salt, canned foods, deli meats, sausages, hot dogs, anything with a long shelf life B. Read nutrition labels and be aware of serving size. Don't go by percent of daily value.

## 2023-06-08 LAB — AFP TUMOR MARKER: AFP-Tumor Marker: 2.3 ng/mL (ref <6.1)

## 2023-06-09 ENCOUNTER — Ambulatory Visit: Payer: Self-pay | Admitting: Physician Assistant

## 2023-06-11 ENCOUNTER — Ambulatory Visit: Payer: MEDICAID | Admitting: Neurology

## 2023-06-11 ENCOUNTER — Encounter: Payer: Self-pay | Admitting: Neurology

## 2023-06-11 VITALS — BP 119/69 | HR 62 | Ht 70.0 in | Wt 202.8 lb

## 2023-06-11 DIAGNOSIS — G5602 Carpal tunnel syndrome, left upper limb: Secondary | ICD-10-CM

## 2023-06-11 DIAGNOSIS — G5132 Clonic hemifacial spasm, left: Secondary | ICD-10-CM

## 2023-06-11 DIAGNOSIS — R569 Unspecified convulsions: Secondary | ICD-10-CM | POA: Diagnosis not present

## 2023-06-11 DIAGNOSIS — E538 Deficiency of other specified B group vitamins: Secondary | ICD-10-CM

## 2023-06-11 DIAGNOSIS — K7460 Unspecified cirrhosis of liver: Secondary | ICD-10-CM

## 2023-06-11 MED ORDER — ALPRAZOLAM 1 MG PO TABS: ORAL_TABLET | ORAL | 5 refills | Status: DC

## 2023-06-11 MED ORDER — CYANOCOBALAMIN 1000 MCG/ML IJ SOLN
1000.0000 ug | Freq: Once | INTRAMUSCULAR | Status: AC
Start: 2023-06-11 — End: 2023-06-11
  Administered 2023-06-11: 1000 ug via INTRAMUSCULAR

## 2023-06-11 MED ORDER — PREGABALIN 150 MG PO CAPS: 150.0000 mg | ORAL_CAPSULE | Freq: Two times a day (BID) | ORAL | 5 refills | Status: DC

## 2023-06-11 NOTE — Patient Instructions (Signed)
 Change Pregabalin     You are now on 200 mg three times a day.  I sent in a prescription for the 150 mg tablet:  For one week: Take 200 mg in morning, 150 mg in afternoon and 200 mg at night  For next week Take 150 mg in morning, 150 mg in afternoon and 200 mg at night  Then take 150 mg three times a day

## 2023-06-11 NOTE — Progress Notes (Signed)
 [facility]  PATIENT: Earl [name] DOB: 27-Nov-1960  REFERRING DOCTOR OR PCP:  Dr. Gayl Katos SOURCE: Patient, notes from primary care, imaging reports, MRI images personally reviewed on PACS  _________________________________   HISTORICAL  CHIEF COMPLAINT:  Chief Complaint  Patient presents with   Follow-up    Pt in 10.alone. 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 Pt report hemifacial spasm are better. Pt wants to know if B12 shot can be given.    HISTORY OF PRESENT ILLNESS:  Earl [name] is a 63 y.o. man with numbness in his legs and left facial twitching.      Update 06/11/2023 He had an admission to the hospital for 10/17/2023 when he presented with psychosis.  Etiology was felt to be due to his medications, possibly the Keppra .  He notes that he was trying to lower the pregabalin  dose and he thinks all of his symptoms were due to lower it too quickly.  He had no change in the pregabalin  or Keppra  dose in the couple months before his admission.  In the hospital the Keppra  dose was changed and he was started on oxcarbazepine  600 mg twice a day.  Long-term video monitoring on 05/02/2023 had shown a seizure in the left frontal parietal region.  He was discharged on oxcarbazepine , Keppra  and pregabalin  with the recommendation to titrate the Keppra  off.  I discussed this with him.  He feels the Keppra  has helped his hemifacial spasms and he prefers not to discontinue.  Hemifacial spasms: He reports these are doing better since his hospitalization for Lyrica  withdrawalhas had hemifacial spasms on the left since 2017.  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  He is not a good surgical candidate as he has multiple comorbidities including cirrhosis.   He has not tried Botox  therapy.   He feels that Keppra  seems to be helping the HFS though the spasms continue to occur  frequently.     Neuropathy/history of pernicious anemia: He had pernicious anemia /  severe B12 deficiency around 2012.    He was started on B12 shots and the numbness improved but did not resolve.   Since that time his balance has been poor.  He was diagnosed with a small fiber polyneuropathy after a biopsy by Dr. Joleen Navy.   Neuropathic pain is better on combination of oxcarbazepine  and pregabalin  200 mg po tid. the pregabalin  has helped to reduce the pain.  He stopped the B12 shots we discussed getting back on. He also has restless syndrome and is fidgety in general in his legs.   Sometimes leg jerking wakes him up.   He has some anxiety and occasioanlly takes alprazolam .   He feels the alprazolam  at night helps him sleep better Other:  He has cirrhosis followed by GI.   He has atrial flutter, treated with ablation with benefit.    He has benn found to have hypothyroid and used to be on synthroid .   He has a history of alcohol abuse (2 bottles of wine a day) but stopped a couple years ago and has not resumed.  IMAGING: MRI Brain 06/23/2012.  Shows some scattered T2/FLAIR hyperintense foci predominantly in the deep white matter.  There was one focus in the cerebellum also.  This is a nonspecific finding and most likely represents mild chronic microvascular ischemic change.  The lesions do not have a demyelinating appearance.  There  were no acute findings.  MRI Brain 05/27/2017 showed a few scattered small T2/FLAIR hyperintense foci in the cerebral hemispheres consistent with minimal age-related chronic microvascular ischemic change.  No definite change compared to the 06/23/2012 MRI.  Normal enhancement pattern.  The left vertebral artery is dominant and tortuous.  MRI cervical spine 05/27/2017 shows a normal spinal cord.  There are mild degenerative changes at C3-C4 through C6-C7 but no spinal stenosis or nerve root compression.  MRi brain/iAC 02/24/2022 showed: FINDINGS: Brain: Negative for retrocochlear  mass or inflammatory type enhancement. The left vertebral artery is dominant and torturous, traversing the left CP angle cistern. The left AICA passes in close proximity to the cisternal left vestibulocochlear nerve complex with branch that extends medial and then inferior with contact of the left facial nerve root entry zone. Normal appearance of the brainstem.   Few remote white matter insults with nonspecific pattern. No acute or subacute infarct, hemorrhage, hydrocephalus, or collection.   Vascular: Normal flow voids.   Skull and upper cervical spine: Normal marrow signal.   Sinuses/Orbits: Negative.   IMPRESSION: Negative for mass or neuritic enhancement. Arterial anatomy at the left facial nerve root entry zone described above.  NCV 07/21/2022:    There is mild left-sided median neuropathy across the wrist. Patient declined EMG/Needle study. Patient declined testing of his right arm   REVIEW OF SYSTEMS: Constitutional: No fevers, chills, sweats, or change in appetite.     Eyes: No visual changes, double vision, eye pain Ear, nose and throat: No hearing loss, ear pain, nasal congestion, sore throat Cardiovascular: No chest pain, palpitations Respiratory:  No shortness of breath at rest or with exertion.   No wheezes GastrointestinaI: No nausea, vomiting, diarrhea, abdominal pain, fecal incontinence Genitourinary:  No dysuria, urinary retention or frequency.  No nocturia. Musculoskeletal:  No neck pain, back pain Integumentary: No rash, pruritus, skin lesions Neurological: as above Psychiatric: No depression at this time.  No anxiety Endocrine: No palpitations, diaphoresis, change in appetite, change in weigh or increased thirst Hematologic/Lymphatic:  No anemia, purpura, petechiae. Allergic/Immunologic: No itchy/runny eyes, nasal congestion, recent allergic reactions, rashes  ALLERGIES: Allergies  Allergen Reactions   Amitriptyline Anaphylaxis   Amoxicillin Anaphylaxis  and Rash   Asa [Aspirin] Anaphylaxis   Penicillin G Anaphylaxis   Almecillin    Clonidine Derivatives Other (See Comments)    Severe Dry Mouth   Hydrochlorothiazide     Caused rectal bleeding and hematuria   Levothyroxine  Nausea And Vomiting    Patient states that the 75 mcg is too strong for him.   Trintellix  [Vortioxetine ] Nausea And Vomiting    HOME MEDICATIONS:  Current Outpatient Medications:    clotrimazole  (LOTRIMIN ) 1 % cream, Apply topically 2 (two) times daily., Disp: 30 g, Rfl: 0   cyanocobalamin  (VITAMIN B12) 1000 MCG/ML injection, Inject 1 mL (1,000 mcg total) into the muscle every 30 (thirty) days., Disp: 1 mL, Rfl: 11   diltiazem  (CARDIZEM  CD) 240 MG 24 hr capsule, Take 1 capsule (240 mg total) by mouth daily., Disp: 30 capsule, Rfl: 0   feeding supplement (ENSURE ENLIVE / ENSURE PLUS) LIQD, Take 237 mLs by mouth 3 (three) times daily between meals. (Patient taking differently: Take 237 mLs by mouth daily.), Disp: 10000 mL, Rfl: 0   levETIRAcetam  (KEPPRA ) 750 MG tablet, Take 1 tablet (750 mg total) by mouth 2 (two) times daily., Disp: 60 tablet, Rfl: 1   ondansetron  (ZOFRAN ) 4 MG tablet, TAKE 1 TABLET BY MOUTH TWICE DAILY AS NEEDED  FOR NAUSEA OR FOR VOMITING, Disp: 30 tablet, Rfl: 1   oxcarbazepine  (TRILEPTAL ) 600 MG tablet, Take 1 tablet (600 mg total) by mouth 2 (two) times daily., Disp: 60 tablet, Rfl: 2   pantoprazole  (PROTONIX ) 40 MG tablet, Take 1 tablet (40 mg total) by mouth daily before breakfast. (Patient taking differently: Take 40 mg by mouth daily as needed (For acid reflux).), Disp: 90 tablet, Rfl: 0   pregabalin  (LYRICA ) 150 MG capsule, Take 1 capsule (150 mg total) by mouth 2 (two) times daily., Disp: 90 capsule, Rfl: 5   thiamine  (VITAMIN B-1) 100 MG tablet, Take 1 tablet (100 mg total) by mouth daily., Disp: 30 tablet, Rfl: 0   [START ON 06/28/2023] ALPRAZolam  (XANAX ) 1 MG tablet, One po every day prn, Disp: 30 tablet, Rfl: 5  PAST MEDICAL HISTORY: Past  Medical History:  Diagnosis Date   Alcohol abuse    Anemia    Anxiety    Arthritis    Colon cancer (HCC)    Depression    Dysrhythmia    Gall stones    Heart burn    History of kidney stones    Hyperlipidemia    Hypertension    Hypothyroidism    Liver disease    Pernicious anemia    Tachycardia    Typical atrial flutter (HCC)    Vitamin B12 deficiency     PAST SURGICAL HISTORY: Past Surgical History:  Procedure Laterality Date   A-FLUTTER ABLATION N/A 07/31/2016   Procedure: A-Flutter Ablation;  Surgeon: Jolly Needle, MD;  Location: [facility];  Service: Cardiovascular;  Laterality: N/A;   BIOPSY  05/08/2022   Procedure: BIOPSY;  Surgeon: Lajuan Pila, MD;  Location: [facility];  Service: Gastroenterology;;   COLONOSCOPY N/A 04/17/2016   Procedure: COLONOSCOPY;  Surgeon: Ruby Corporal, MD;  Location: [facility];  Service: Endoscopy;  Laterality: N/A;  1200   COLONOSCOPY N/A 05/07/2017   Procedure: COLONOSCOPY;  Surgeon: Ruby Corporal, MD;  Location: [facility];  Service: Endoscopy;  Laterality: N/A;  200   COLONOSCOPY WITH PROPOFOL  N/A 07/25/2020   Procedure: COLONOSCOPY WITH PROPOFOL ;  Surgeon: Ruby Corporal, MD;  Location: [facility];  Service: Endoscopy;  Laterality: N/A;  12:10   ESOPHAGEAL BANDING  07/25/2020   Procedure: ESOPHAGEAL BANDING;  Surgeon: Ruby Corporal, MD;  [facility]: [facility];  Service: Endoscopy;;   ESOPHAGOGASTRODUODENOSCOPY (EGD) WITH PROPOFOL  N/A 07/25/2020   Procedure: ESOPHAGOGASTRODUODENOSCOPY (EGD) WITH PROPOFOL ;  Surgeon: Ruby Corporal, MD;  [facility];  Service: Endoscopy;  Laterality: N/A;   ESOPHAGOGASTRODUODENOSCOPY (EGD) WITH PROPOFOL  N/A 04/11/2021   Procedure: ESOPHAGOGASTRODUODENOSCOPY (EGD) WITH PROPOFOL ;  Surgeon: Ruby Corporal, MD;  [facility];  Service: Endoscopy;  Laterality: N/A;  1:00   ESOPHAGOGASTRODUODENOSCOPY (EGD) WITH PROPOFOL  N/A 05/08/2022   Procedure:  ESOPHAGOGASTRODUODENOSCOPY (EGD) WITH PROPOFOL ;  Surgeon: Lajuan Pila, MD;  Location: [facility];  Service: Gastroenterology;  Laterality: N/A;   implantable loop recorder placement  01/24/2019    Medtronic Reveal Linq model LNQ 11 implantable loop recorder (SN [id number] ) by Dr Nunzio Belch for evaluation of palpitations   MINOR CARPAL TUNNEL     right   POLYPECTOMY  04/17/2016   Procedure: POLYPECTOMY;  Surgeon: Ruby Corporal, MD;  [facility];  Service: Endoscopy;;  sigmoid   POLYPECTOMY  07/25/2020   Procedure: POLYPECTOMY;  Surgeon: Ruby Corporal, MD;  [facility];  Service: Endoscopy;;    FAMILY HISTORY:  Family History  Problem Relation Age of Onset   Alzheimer's disease Mother    Hyperlipidemia Mother    Gallbladder disease Mother    Heart disease Father    Coronary artery disease Father    Liver disease Father    Thyroid  disease Father    Hyperlipidemia Father    Heart attack Father    Cirrhosis Father    Gallbladder disease Sister    Coronary artery disease Paternal Grandfather     SOCIAL HISTORY:  Social History   Socioeconomic History   Marital status: Single    Spouse name: Not on file   Number of children: 0   Years of education: Not on file   Highest education level: Not on file  Occupational History   Occupation: retired/disabled  Tobacco Use   Smoking status: Former    Current packs/day: 0.00    Types: Cigarettes    Quit date: 03/03/1979    Years since quitting: 44.3   Smokeless tobacco: Never  Vaping Use   Vaping status: Never Used  Substance and Sexual Activity   Alcohol use: Not Currently    Alcohol/week: 8.0 standard drinks of alcohol    Types: 8 Cans of beer per week    Comment: every other day   Drug use: No   Sexual activity: Not on file  Other Topics Concern   Not on file  Social History Narrative   Disabled   Lives in [place]   Social Drivers of Health   Financial Resource Strain: Medium Risk (04/25/2023)    Overall Financial Resource Strain (CARDIA)    Difficulty of Paying Living Expenses: Somewhat hard  Food Insecurity: No Food Insecurity (05/06/2023)   Hunger Vital Sign    Worried About Running Out of Food in the Last Year: Never true    Ran Out of Food in the Last Year: Never true  Recent Concern: Food Insecurity - Food Insecurity Present (05/01/2023)   Hunger Vital Sign    Worried About Running Out of Food in the Last Year: Sometimes true    Ran Out of Food in the Last Year: Never true  Transportation Needs: No Transportation Needs (05/06/2023)   PRAPARE - Administrator, Civil Service (Medical): No    Lack of Transportation (Non-Medical): No  Physical Activity: Sufficiently Active (04/25/2023)   Exercise Vital Sign    Days of Exercise per Week: 7 days    Minutes of Exercise per Session: 150+ min  Stress: Stress Concern Present (04/25/2023)   [employer] of Occupational Health - Occupational Stress Questionnaire    Feeling of Stress : Very much  Social Connections: Socially Isolated (04/25/2023)   Social Connection and Isolation Panel [NHANES]    Frequency of Communication with Friends and Family: More than three times a week    Frequency of Social Gatherings with Friends and Family: Never    Attends Religious Services: Never    Database administrator or Organizations: No    Attends Engineer, structural: Not on file    Marital Status: Never married  Intimate Partner Violence: Not At Risk (05/06/2023)   Humiliation, Afraid, Rape, and Kick questionnaire    Fear of Current or Ex-Partner: No    Emotionally Abused: No    Physically Abused: No    Sexually Abused: No     PHYSICAL EXAM  Vitals:   06/11/23 1340  BP: 119/69  Pulse: 62  Weight: 202 lb 12.8 oz (92 kg)  Height: 5\' 10"  (  1.778 m)     Body mass index is 29.1 kg/m.   General: The patient is well-developed and well-nourished and in no acute distress  Eyes:  Funduscopic exam shows normal optic  discs and retinal vessels.  Neck: The neck is supple, no carotid bruits are noted.  The neck is nontender.  Cardiovascular: The heart has a regular rate and rhythm with a normal S1 and S2. There were no murmurs, gallops or rubs.    Skin: Extremities are without significant edema.  Musculoskeletal:  Back is nontender  Neurologic Exam  Mental status: The patient is alert and oriented x 3 at the time of the examination. The patient has apparent normal recent and remote memory, with an apparently normal attention span and concentration ability.   Speech is normal.  Cranial nerves: Extraocular movements are full. Pupils are equal, round, and reactive to light and accomodation.  Visual fields are full.  Minimal left ptosis.  Of note, there is evidence of cross reinnervation with eye closure upon smiling and the left corner of the mouth going up when he closes his eyes.  Frequent left hemifacial spasms are noted.  He has good facial sensation to soft touch bilaterally  No obvious hearing deficits are noted.  Motor:  Muscle bulk is normal.   Tone is normal. Strength is  5 / 5 in all 4 extremities.   Sensory: Sensory testing shows reduced sensation to pinprick in 4th/5th fingers.   He has a Tinel's sign at left wrist     Phalens sign to left  Coordination: Cerebellar testing reveals good finger-nose-finger and heel-to-shin bilaterally.  Gait and station: Station is normal.   Gait is normal. Tandem gait is slightly wide. Romberg is negative.   Reflexes: Deep tendon reflexes are symmetric and normal bilaterally.   Plantar responses are flexor.    DIAGNOSTIC DATA (LABS, IMAGING, TESTING) - I reviewed patient records, labs, notes, testing and imaging myself where available.  Lab Results  Component Value Date   WBC 4.9 06/05/2023   HGB 14.4 06/05/2023   HCT 43.0 06/05/2023   MCV 95.4 06/05/2023   PLT 109.0 (L) 06/05/2023      Component Value Date/Time   NA 142 06/05/2023 1550   NA 141  04/28/2023 1449   K 4.8 06/05/2023 1550   CL 108 06/05/2023 1550   CO2 29 06/05/2023 1550   GLUCOSE 87 06/05/2023 1550   BUN 19 06/05/2023 1550   BUN 13 04/28/2023 1449   CREATININE 0.70 06/05/2023 1550   CREATININE 0.83 07/18/2020 1304   CALCIUM  8.7 06/05/2023 1550   PROT 6.7 06/05/2023 1550   PROT 6.8 04/28/2023 1449   ALBUMIN 4.0 06/05/2023 1550   ALBUMIN 4.2 04/28/2023 1449   AST 19 06/05/2023 1550   ALT 16 06/05/2023 1550   ALKPHOS 123 (H) 06/05/2023 1550   BILITOT 0.4 06/05/2023 1550   BILITOT 0.8 04/28/2023 1449   GFRNONAA >60 05/08/2023 0732   GFRNONAA 71 09/06/2019 1313   GFRAA 82 09/06/2019 1313   Lab Results  Component Value Date   CHOL 129 09/05/2022   HDL 37 (L) 09/05/2022   LDLCALC 76 09/05/2022   TRIG 79 09/05/2022   CHOLHDL 3.5 09/05/2022   Lab Results  Component Value Date   HGBA1C 4.5 (L) 07/26/2019   Lab Results  Component Value Date   VITAMINB12 780 05/06/2023   Lab Results  Component Value Date   TSH 2.490 09/05/2022       ASSESSMENT AND PLAN  Seizures (HCC)  Vitamin B 12 deficiency - Plan: cyanocobalamin  (VITAMIN B12) injection 1,000 mcg  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  Unguinal also continue the Keppra  750 mg twice a day as this seems to be helping his hemifacial spasms.  To avoid overmedication I will see if we can taper the pregabalin  to a lower dose (currently 200 mg 3 times daily and I will try to lower to 150 mg 3 times daily) We discussed Botox  for the HFS he would consider doing this.  Likely not candidate for Neurosurgery for decompression of left 7th nerve (AICA may be  compressing) due to cirrhosis B12 1000 mcg now and he will get additional  B12 shots with the pharmacist. Refill prn alprazolam  for anxiety Rtc  6 months or sooner if new or worsening issues.    Layton Tappan A. Godwin Lat, MD, [facility] 06/11/2023, 5:58 PM Certified in Neurology, Clinical Neurophysiology, Sleep Medicine, Pain Medicine and Neuroimaging  [facility] [address] [place], [place] [address] [phone]

## 2023-06-17 ENCOUNTER — Ambulatory Visit (HOSPITAL_COMMUNITY)
Admission: RE | Admit: 2023-06-17 | Discharge: 2023-06-17 | Disposition: A | Discharge status: Home / Self Care | Payer: MEDICAID | Source: Ambulatory Visit | Attending: Physician Assistant | Admitting: Physician Assistant

## 2023-06-17 ENCOUNTER — Other Ambulatory Visit: Payer: Self-pay | Admitting: Neurology

## 2023-06-17 DIAGNOSIS — K7031 Alcoholic cirrhosis of liver with ascites: Secondary | ICD-10-CM | POA: Insufficient documentation

## 2023-06-17 DIAGNOSIS — F1020 Alcohol dependence, uncomplicated: Secondary | ICD-10-CM

## 2023-06-17 DIAGNOSIS — G629 Polyneuropathy, unspecified: Secondary | ICD-10-CM

## 2023-06-17 DIAGNOSIS — I8510 Secondary esophageal varices without bleeding: Secondary | ICD-10-CM | POA: Diagnosis present

## 2023-06-17 DIAGNOSIS — E538 Deficiency of other specified B group vitamins: Secondary | ICD-10-CM

## 2023-06-17 DIAGNOSIS — R259 Unspecified abnormal involuntary movements: Secondary | ICD-10-CM

## 2023-06-18 ENCOUNTER — Telehealth: Payer: Self-pay

## 2023-06-18 ENCOUNTER — Encounter: Payer: Self-pay | Admitting: Family

## 2023-06-18 ENCOUNTER — Ambulatory Visit (INDEPENDENT_AMBULATORY_CARE_PROVIDER_SITE_OTHER): Payer: MEDICAID | Admitting: Family

## 2023-06-18 VITALS — BP 105/60 | HR 53 | Temp 97.7°F | Ht 70.0 in | Wt 207.4 lb

## 2023-06-18 DIAGNOSIS — R569 Unspecified convulsions: Secondary | ICD-10-CM | POA: Diagnosis not present

## 2023-06-18 DIAGNOSIS — E538 Deficiency of other specified B group vitamins: Secondary | ICD-10-CM

## 2023-06-18 DIAGNOSIS — B3789 Other sites of candidiasis: Secondary | ICD-10-CM | POA: Diagnosis not present

## 2023-06-18 DIAGNOSIS — Z09 Encounter for follow-up examination after completed treatment for conditions other than malignant neoplasm: Secondary | ICD-10-CM

## 2023-06-18 MED ORDER — CLOTRIMAZOLE 1 % EX CREA: TOPICAL_CREAM | Freq: Two times a day (BID) | CUTANEOUS | 3 refills | Status: AC

## 2023-06-18 NOTE — Progress Notes (Signed)
 Subjective:    Patient ID: Earl [name], male    DOB: October 19, 1960, 63 y.o.   MRN: [id number]  Chief Complaint  Patient presents with   Follow-up   PT presents to the office today for hospital for psychosis. He had decreased his Lyrica  from 200 mg TID to 200 mg BID. He had confusion and hallucinations. He is followed by Neurologists and will be weaning him off his Keppra  750 mg BID and Trileptal  600 mg BID.   He went to the ED on 04/30/23 for GAD and was discharged. Then went back to ED on 05/01/23 for transient alteration of awareness and discharged on 05/03/23. He went back to the ED on 05/05/23 for psychosis and visual hallucinations was admitted 05/06/23 and discharged on 05/08/23.  He saw his neurologists on 06/11/23. They have started tampering him down on his Lyrica , "For one week: Take 200 mg in morning, 150 mg in afternoon and 200 mg at night   For next week Take 150 mg in morning, 150 mg in afternoon and 200 mg at night   Then take 150 mg three times a day"  Doing well so far at this dosing.   States he has never had seizures prior to decreasing Lyrica  dosing.   Requesting rx of clotrimazole  1%.  Seizures  This is a new problem.  Rash This is a recurrent problem. The current episode started more than 1 month ago. The problem has been waxing and waning since onset. Location: groin.      Review of Systems  Skin:  Positive for rash.  Neurological:  Positive for seizures.    Social History   Socioeconomic History   Marital status: Single    Spouse name: Not on file   Number of children: 0   Years of education: Not on file   Highest education level: Not on file  Occupational History   Occupation: retired/disabled  Tobacco Use   Smoking status: Former    Current packs/day: 0.00    Types: Cigarettes    Quit date: 03/03/1979    Years since quitting: 44.3   Smokeless tobacco: Never  Vaping Use   Vaping status: Never Used  Substance and Sexual Activity    Alcohol use: Not Currently    Alcohol/week: 8.0 standard drinks of alcohol    Types: 8 Cans of beer per week    Comment: every other day   Drug use: No   Sexual activity: Not on file  Other Topics Concern   Not on file  Social History Narrative   Disabled   Lives in [place]   Social Drivers of Health   Financial Resource Strain: Medium Risk (04/25/2023)   Overall Financial Resource Strain (CARDIA)    Difficulty of Paying Living Expenses: Somewhat hard  Food Insecurity: No Food Insecurity (05/06/2023)   Hunger Vital Sign    Worried About Running Out of Food in the Last Year: Never true    Ran Out of Food in the Last Year: Never true  Recent Concern: Food Insecurity - Food Insecurity Present (05/01/2023)   Hunger Vital Sign    Worried About Running Out of Food in the Last Year: Sometimes true    Ran Out of Food in the Last Year: Never true  Transportation Needs: No Transportation Needs (05/06/2023)   PRAPARE - Administrator, Civil Service (Medical): No    Lack of Transportation (Non-Medical): No  Physical Activity: Sufficiently Active (04/25/2023)   Exercise Vital Sign  Days of Exercise per Week: 7 days    Minutes of Exercise per Session: 150+ min  Stress: Stress Concern Present (04/25/2023)   [employer] of Occupational Health - Occupational Stress Questionnaire    Feeling of Stress : Very much  Social Connections: Socially Isolated (04/25/2023)   Social Connection and Isolation Panel [NHANES]    Frequency of Communication with Friends and Family: More than three times a week    Frequency of Social Gatherings with Friends and Family: Never    Attends Religious Services: Never    Database administrator or Organizations: No    Attends Engineer, structural: Not on file    Marital Status: Never married   Family History  Problem Relation Age of Onset   Alzheimer's disease Mother    Hyperlipidemia Mother    Gallbladder disease Mother    Heart disease Father     Coronary artery disease Father    Liver disease Father    Thyroid  disease Father    Hyperlipidemia Father    Heart attack Father    Cirrhosis Father    Gallbladder disease Sister    Coronary artery disease Paternal Grandfather         Objective:   Physical Exam    BP 105/60   Pulse (!) 53   Temp 97.7 F (36.5 C) (Temporal)   Ht 5\' 10"  (1.778 m)   Wt 207 lb 6.4 oz (94.1 kg)   SpO2 97%   BMI 29.76 kg/m      Assessment & Plan:  JAEVION GOTO comes in today with chief complaint of Follow-up   Diagnosis and orders addressed:  1. Hospital discharge follow-up (Primary) - CMP14+EGFR  2. Vitamin B 12 deficiency - Vitamin B12 - CMP14+EGFR  3. Seizure (HCC) - CMP14+EGFR  4. Candida rash of groin -Keep clean and dry - clotrimazole  (LOTRIMIN ) 1 % cream; Apply topically 2 (two) times daily.  Dispense: 90 g; Refill: 3   Labs pending Hospital notes reviewed  Continue current medications and tamper down medications per Neurologists  Keep follow up with specialists  Health Maintenance reviewed Diet and exercise encouraged Keep chronic follow up with me   Tommas Fragmin, FNP

## 2023-06-18 NOTE — Patient Instructions (Signed)
 Seizure, Adult A seizure is a sudden burst of abnormal activity in the brain. Seizures usually last from 30 seconds to 2 minutes. There are many types of seizures. And they can cause many different symptoms. What are the causes? Common causes of a seizure include: Fever or infection. Problems that affect the brain. These may include: A brain or head injury. A stroke. A brain tumor. Low levels of blood sugar or salt. Kidney problems or liver problems. Some inherited conditions. These are passed down from parent to child. Problems with a substance, such as: Having a reaction to a drug or a medicine. Stopping the use of a substance all of a sudden. When this causes problems, it's called withdrawal. Disorders that affect how you develop, such as autism spectrum disorder or cerebral palsy. Sometimes, the cause may not be known. Some people who have a seizure never have another one. A person who has repeated seizures over time without a clear cause has a condition called epilepsy. What increases the risk? Having a family history of epilepsy. Having had a tonic-clonic seizure before. This type of seizure causes: The muscles of the whole body to tighten, or contract. Loss of consciousness. Having a head injury or a stroke in the past. Having had too little oxygen at birth. What are the signs or symptoms? The symptoms vary depending on the type of seizure you have. Symptoms during a seizure Having convulsions. This means shaking with fast, jerky movements of muscles. Stiffness of the body. Breathing problems. Being confused. Staring or not responding to sound or touch. Head nodding, eye blinking, eye twitching, or fast eye movements. Drooling, grunting, or making clicking sounds with your mouth. Losing control of when you pee or poop. Symptoms before a seizure Feeling afraid, worried, or nervous. Feeling like you may vomit. Vertigo. This feels like: You are moving when you're  not. Things around you are moving when they're not. Dj vu. This is a feeling of having seen or heard something before. Odd tastes or smells. Changes in how you see. You may see flashing lights or spots. Symptoms after a seizure Being confused. Feeling sleepy. Headache. Sore muscles. How is this diagnosed? A seizure may be diagnosed based on: A description of your symptoms. Video of your seizures can be helpful. Your medical history. A physical exam. Tests, such as: Blood tests. CT scan. MRI. Electroencephalogram, or EEG. This test measures electrical activity in the brain. A test of your spinal fluid. This is called a spinal tap or lumbar puncture. How is this treated? If your seizure stops on its own, you will not need treatment. If your seizure lasts longer than 5 minutes, you'll normally need treatment. This may include: Medicines given through an IV. Avoiding things, such as medicines, that are known to cause your seizures. Medicines to prevent seizures. These are called antiepileptics. A device to prevent or control seizures. Eating foods that are low in carbohydrates and high in fat (ketogenic diet). Surgery. This is sometimes needed if you keep having seizures. Follow these instructions at home: Medicines Take your medicines only as told by your health care provider. Avoid anything that may keep your medicine from working, such as alcohol. Activity Follow your provider's advice about driving, swimming, and doing other things that would be dangerous if you had a seizure. Wait until your provider says it's safe for you to do these things. If you live in the [place] ask your local department of motor vehicles [facility]) when you can drive. Get  enough rest and sleep. Not getting enough sleep can make seizures more likely to happen. Teaching others  Teach friends and family what to do if you have a seizure. Tell them to: Help you get down to the ground safely. Protect your head  and body. Loosen any clothing around your neck. Turn you on your side. This helps keep your airway clear if you vomit. Know whether or not you need emergency care. Stay with you until you are better. Also, tell them what not to do if you have a seizure. Tell them: They should not hold you down. They should not put anything in your mouth. General instructions Avoid anything that has caused you to have seizures. Keep a seizure diary. Write down: What you remember about each seizure. What you think might have caused each seizure. Keep all follow-up visits. Your provider may need to monitor your progress. Contact a health care provider if: You have another seizure or seizures. Call each time you have a seizure. You have a change in how often or when you have seizures. You keep having seizures with treatment. You have symptoms of being sick or having an infection. You are not able to take your medicine. Get help right away if: You have or someone has seen you have: A seizure that lasts longer than 5 minutes. Many seizures in a row and you don't feel better between seizures. A seizure that makes it harder to breathe. A seizure that leaves you unable to speak or use a part of your body. You didn't wake up right away after a seizure. You injure yourself during a seizure. You have confusion or pain right after a seizure. These symptoms may be an emergency. Call 911 right away. Do not wait to see if the symptoms will go away. Do not drive yourself to the hospital. This information is not intended to replace advice given to you by your health care provider. Make sure you discuss any questions you have with your health care provider. Document Revised: 10/16/2022 Document Reviewed: 02/26/2022 Elsevier Patient Education  2024 [employer].

## 2023-06-18 NOTE — Telephone Encounter (Signed)
 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 Pt states he was told by the hospital that he needs to stop them, but he is concerned because they also told him coming off of them he will go into withdraw and have a seizure. Pt states that he doesn't want to end up back in the hospital,so he is asking that Dr. Godwin Lat help him safely wean himself off of his medications. Told pt Dr. Godwin Lat is out of the office and will return Tuesday and can address his concerns when he returns.

## 2023-06-19 ENCOUNTER — Ambulatory Visit: Payer: Self-pay | Admitting: Family

## 2023-06-19 LAB — CMP14+EGFR
ALT: 17 IU/L (ref 0–44)
AST: 20 IU/L (ref 0–40)
Albumin: 3.9 g/dL (ref 3.9–4.9)
Alkaline Phosphatase: 134 IU/L — ABNORMAL HIGH (ref 44–121)
BUN/Creatinine Ratio: 26 — ABNORMAL HIGH (ref 10–24)
BUN: 18 mg/dL (ref 8–27)
Bilirubin Total: <0.2 mg/dL (ref 0.0–1.2)
CO2: 22 mmol/L (ref 20–29)
Calcium: 8.6 mg/dL (ref 8.6–10.2)
Chloride: 109 mmol/L — ABNORMAL HIGH (ref 96–106)
Creatinine, Ser: 0.69 mg/dL — ABNORMAL LOW (ref 0.76–1.27)
Globulin, Total: 1.9 g/dL (ref 1.5–4.5)
Glucose: 83 mg/dL (ref 70–99)
Potassium: 4.9 mmol/L (ref 3.5–5.2)
Sodium: 144 mmol/L (ref 134–144)
Total Protein: 5.8 g/dL — ABNORMAL LOW (ref 6.0–8.5)
eGFR: 103 mL/min/{1.73_m2} (ref >59)

## 2023-06-19 LAB — VITAMIN B12: Vitamin B-12: 365 pg/mL (ref 232–1245)

## 2023-06-23 ENCOUNTER — Ambulatory Visit (INDEPENDENT_AMBULATORY_CARE_PROVIDER_SITE_OTHER): Payer: MEDICAID | Admitting: Orthopaedic Surgery

## 2023-06-23 DIAGNOSIS — G5602 Carpal tunnel syndrome, left upper limb: Secondary | ICD-10-CM

## 2023-06-23 NOTE — Progress Notes (Signed)
 Office Visit Note   Patient: Earl [name]           Date of Birth: [birth year]           MRN: [id number] Visit Date: 06/23/2023              Requested by: Yevette Hem, FNP [address] [place],  [place] [address] PCP: Yevette Hem, FNP   Assessment & Plan: Visit Diagnoses:  1. Left carpal tunnel syndrome     Plan: History of Present Illness Earl [name] "Earl [name]" is a 63 year old male with carpal tunnel syndrome who presents with worsening symptoms in his left hand. He was referred by Dr. Godwin Lat to [facility] for evaluation of his carpal tunnel syndrome.  He experiences numbness in the palm and thumb of his left hand, particularly when riding an electric bike or holding a phone, with symptoms appearing within five minutes. Nocturnal symptoms occasionally wake him. Persistent pain affects his ability to play the guitar, reducing practice time.  A nerve study in June of last year confirmed carpal tunnel syndrome. He previously had surgery on his right hand, which resolved similar symptoms.  Fingertips feel normal, but compression of the carpal tunnel area induces numbness and pain. He has difficulty making a full fist and touching his thumb to his fingers.  Physical Exam MUSCULOSKELETAL: Full fist causes pain in left hand. Limited thumb opposition in left hand. Compression of carpal tunnel causes numbness in left hand. NEUROLOGICAL: Sensation intact in fingertips of left hand.  Results DIAGNOSTIC Nerve conduction study: Mild carpal tunnel syndrome (06/2022)  Assessment and Plan Carpal tunnel syndrome, left hand Chronic left hand carpal tunnel syndrome with numbness, tingling, and pain. Symptoms similar to resolved right hand case. Declined conservative treatments, opting for surgery. - Proceed with surgical intervention for carpal tunnel release in the left hand. - [employer] authorization for the surgery. - Surgery scheduler to contact  him within a couple of weeks to arrange the procedure.  Follow-Up Instructions: No follow-ups on file.    Subjective: Chief Complaint  Patient presents with   Left Hand - Pain    HPI  Review of Systems  Constitutional: Negative.   HENT: Negative.    Eyes: Negative.   Respiratory: Negative.    Cardiovascular: Negative.   Gastrointestinal: Negative.   Endocrine: Negative.   Genitourinary: Negative.   Skin: Negative.   Allergic/Immunologic: Negative.   Neurological: Negative.   Hematological: Negative.   Psychiatric/Behavioral: Negative.    All other systems reviewed and are negative.    Objective: Vital Signs: There were no vitals taken for this visit.  Physical Exam Vitals and nursing note reviewed.  Constitutional:      Appearance: He is well-developed.  HENT:     Head: Normocephalic and atraumatic.  Eyes:     Pupils: Pupils are equal, round, and reactive to light.  Pulmonary:     Effort: Pulmonary effort is normal.  Abdominal:     Palpations: Abdomen is soft.  Musculoskeletal:        General: Normal range of motion.     Cervical back: Neck supple.  Skin:    General: Skin is warm.  Neurological:     Mental Status: He is alert and oriented to person, place, and time.  Psychiatric:        Behavior: Behavior normal.        Thought Content: Thought content normal.  Judgment: Judgment normal.    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   Left carpal tunnel syndrome 06/10/2022   Esophageal varices (HCC) 12/09/2021   Paroxysmal atrial fibrillation (HCC) 12/03/2021   Involuntary movements 09/26/2021   Alcoholic cirrhosis (HCC) 06/19/2020   Malignant neoplasm of sigmoid colon (HCC) 11/10/2019   SVT (supraventricular tachycardia) (HCC)    NSVT (nonsustained ventricular tachycardia) (HCC)    Lactic acidosis 07/26/2019   Other ascites 07/26/2019    Noncompliance by refusing service 03/02/2019   Alcohol abuse, 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   Depression, recurrent (HCC) 01/05/2019   Overweight (BMI 25.0-29.9) 10/13/2018   Mixed hyperlipidemia 08/17/2018   Vitamin D  deficiency 08/17/2018   Subclinical hypothyroidism 08/17/2018   Abnormal LFTs 07/29/2018   Numbness 05/19/2017   History of colon cancer 04/16/2017   Palpitations 03/09/2017   Atrial flutter (HCC) 03/09/2017   Hematochezia 11/30/2015   Neuropathy 08/30/2015   Rectal bleeding 08/30/2015   Essential hypertension 08/30/2015   Abnormal brain MRI 08/30/2015   Vitamin B 12 deficiency 08/30/2015   Past Medical History:  Diagnosis Date   Alcohol abuse    Anemia    Anxiety    Arthritis    Colon cancer (HCC)    Depression    Dysrhythmia    Gall stones    Heart burn    History of kidney stones    Hyperlipidemia    Hypertension    Hypothyroidism    Liver disease    Pernicious anemia    Tachycardia    Typical atrial flutter (HCC)    Vitamin B12 deficiency     Family History  Problem Relation Age of Onset   Alzheimer's disease Mother    Hyperlipidemia Mother    Gallbladder disease Mother    Heart disease Father    Coronary artery disease Father    Liver disease Father    Thyroid  disease Father    Hyperlipidemia Father    Heart attack Father    Cirrhosis Father    Gallbladder disease Sister    Coronary artery disease Paternal Grandfather     Past Surgical History:  Procedure Laterality Date   A-FLUTTER ABLATION N/A 07/31/2016   Procedure: A-Flutter Ablation;  Surgeon: Jolly Needle, MD;  Location: [facility];  Service: Cardiovascular;  Laterality: N/A;   BIOPSY  05/08/2022   Procedure: BIOPSY;  Surgeon: Lajuan Pila, MD;  Location: [facility];  Service: Gastroenterology;;   COLONOSCOPY N/A 04/17/2016   Procedure: COLONOSCOPY;  Surgeon: Ruby Corporal, MD;  Location: [facility];  Service: Endoscopy;  Laterality: N/A;  1200   COLONOSCOPY N/A 05/07/2017    Procedure: COLONOSCOPY;  Surgeon: Ruby Corporal, MD;  Location: [facility];  Service: Endoscopy;  Laterality: N/A;  200   COLONOSCOPY WITH PROPOFOL  N/A 07/25/2020   Procedure: COLONOSCOPY WITH PROPOFOL ;  Surgeon: Ruby Corporal, MD;  Location: [facility];  Service: Endoscopy;  Laterality: N/A;  12:10   ESOPHAGEAL BANDING  07/25/2020   Procedure: ESOPHAGEAL BANDING;  Surgeon: Ruby Corporal, MD;  Location: [facility];  Service: Endoscopy;;   ESOPHAGOGASTRODUODENOSCOPY (EGD) WITH PROPOFOL  N/A 07/25/2020   Procedure: ESOPHAGOGASTRODUODENOSCOPY (EGD) WITH PROPOFOL ;  Surgeon: Ruby Corporal, MD;  [facility]: [facility];  Service: Endoscopy;  Laterality: N/A;   ESOPHAGOGASTRODUODENOSCOPY (EGD) WITH PROPOFOL  N/A 04/11/2021   Procedure: ESOPHAGOGASTRODUODENOSCOPY (EGD) WITH PROPOFOL ;  Surgeon: Ruby Corporal,  MD;  Location: [facility];  Service: Endoscopy;  Laterality: N/A;  1:00   ESOPHAGOGASTRODUODENOSCOPY (EGD) WITH PROPOFOL  N/A 05/08/2022   Procedure: ESOPHAGOGASTRODUODENOSCOPY (EGD) WITH PROPOFOL ;  Surgeon: Lajuan Pila, MD;  Location: [facility];  Service: Gastroenterology;  Laterality: N/A;   implantable loop recorder placement  01/24/2019    [employer]tronic Reveal Linq model LNQ 11 implantable loop recorder (SN [id number] ) by Dr Nunzio Belch for evaluation of palpitations   MINOR CARPAL TUNNEL     right   POLYPECTOMY  04/17/2016   Procedure: POLYPECTOMY;  Surgeon: Ruby Corporal, MD;  [facility]: [facility];  Service: Endoscopy;;  sigmoid   POLYPECTOMY  07/25/2020   Procedure: POLYPECTOMY;  Surgeon: Ruby Corporal, MD;  [facility];  Service: Endoscopy;;   Social History   Occupational History   Occupation: retired/disabled  Tobacco Use   Smoking status: Former    Current packs/day: 0.00    Types: Cigarettes    Quit date: 03/03/1979    Years since quitting: 44.3   Smokeless tobacco: Never  Vaping Use   Vaping status: Never Used  Substance and Sexual Activity    Alcohol use: Not Currently    Alcohol/week: 8.0 standard drinks of alcohol    Types: 8 Cans of beer per week    Comment: every other day   Drug use: No   Sexual activity: Not on file

## 2023-07-14 ENCOUNTER — Other Ambulatory Visit: Payer: Self-pay | Admitting: Physician Assistant

## 2023-07-14 ENCOUNTER — Encounter: Payer: Self-pay | Admitting: Cardiovascular Disease

## 2023-07-14 MED ORDER — ONDANSETRON HCL 4 MG PO TABS: 4.0000 mg | ORAL_TABLET | Freq: Three times a day (TID) | ORAL | 0 refills | Status: AC | PRN

## 2023-07-14 MED ORDER — OXYCODONE HCL 5 MG PO TABS: ORAL_TABLET | ORAL | 0 refills | Status: DC

## 2023-07-15 ENCOUNTER — Encounter (HOSPITAL_BASED_OUTPATIENT_CLINIC_OR_DEPARTMENT_OTHER): Payer: Self-pay | Admitting: Orthopaedic Surgery

## 2023-07-15 ENCOUNTER — Telehealth: Payer: Self-pay | Admitting: Orthopaedic Surgery

## 2023-07-15 ENCOUNTER — Other Ambulatory Visit: Payer: Self-pay

## 2023-07-15 NOTE — Telephone Encounter (Signed)
 Local only is fine by me.

## 2023-07-15 NOTE — Progress Notes (Signed)
 Patient recently admitted 05/06/2023 for seizures/altered mental status. Per Dr. Claudio Culver, patient will need neurology clearance prior to planned surgery on 07/22/2023. Patient states he wants local only, no anesthesia at all. He states he doesn't have anyone to stay with I'm after his surgery at home. He states he as transportation bringing him to and from surgery center. He was very adamant that he wants local only and not to cancel this surgery. Called Dr. Christiane Cowing office to explain patient situation and they will return call back to make further plans.

## 2023-07-15 NOTE — Telephone Encounter (Signed)
 Earl [name] with [facility] at [phone] calling regarding patient's anesthesia options. Patient asking for LOCAL ONLY?  This patient is scheduled for left carpal tunnel release 07-21-24 at [facility] and is coming from [place] by transportation and does not have anyone staying with him after surgery. Can't have MAC.  He does not want to cancel.  Please advise ASAP.

## 2023-07-20 ENCOUNTER — Other Ambulatory Visit: Payer: Self-pay | Admitting: Neurology

## 2023-07-20 ENCOUNTER — Encounter: Payer: Self-pay | Admitting: Cardiovascular Disease

## 2023-07-20 ENCOUNTER — Telehealth: Payer: Self-pay | Admitting: Neurology

## 2023-07-20 ENCOUNTER — Ambulatory Visit: Payer: MEDICAID | Attending: Cardiovascular Disease | Admitting: Cardiovascular Disease

## 2023-07-20 VITALS — BP 100/64 | HR 48 | Ht 70.0 in | Wt 210.8 lb

## 2023-07-20 DIAGNOSIS — G629 Polyneuropathy, unspecified: Secondary | ICD-10-CM

## 2023-07-20 DIAGNOSIS — I484 Atypical atrial flutter: Secondary | ICD-10-CM | POA: Diagnosis not present

## 2023-07-20 DIAGNOSIS — F1020 Alcohol dependence, uncomplicated: Secondary | ICD-10-CM

## 2023-07-20 DIAGNOSIS — R259 Unspecified abnormal involuntary movements: Secondary | ICD-10-CM

## 2023-07-20 DIAGNOSIS — E538 Deficiency of other specified B group vitamins: Secondary | ICD-10-CM

## 2023-07-20 NOTE — Telephone Encounter (Signed)
 Pt is asking for a call from RN to discuss his medications so he does not end up in the hospital again. Pt asked it be noted that he does not use My chart, please call.

## 2023-07-20 NOTE — Telephone Encounter (Signed)
 See telephone encounter on 07/20/23 regarding this Rx.

## 2023-07-20 NOTE — Patient Instructions (Signed)

## 2023-07-20 NOTE — Progress Notes (Signed)
   PCP: Lavell Bari LABOR, FNP   Primary EP: Dr Nancey Elsie VEAR Earl [name] is a 63 y.o. male who presents today for routine electrophysiology followup.  Since last being seen in our clinic, the patient reports doing very well.     Today, he denies symptoms of palpitations, chest pain, shortness of breath,  lower extremity edema, dizziness, presyncope, or syncope.  Otherwise, he does complain of facial twitching that he has had for years but has been worse recently, and he notes neuropathy which causes balance issues and frequent falls.       Physical Exam: Vitals:   07/20/23 1541  BP: 100/64  Pulse: (!) 48  SpO2: 97%  Weight: 210 lb 12.8 oz (95.6 kg)  Height: 5' 10 (1.778 m)   Gen: Appears comfortable, well-nourished CV: brady, regular rhythm, no dependent edema Pulm: breathing easily   Wt Readings from Last 3 Encounters:  07/20/23 210 lb 12.8 oz (95.6 kg)  06/18/23 207 lb 6.4 oz (94.1 kg)  06/11/23 202 lb 12.8 oz (92 kg)      Assessment and Plan:  SVT Well controlled by ILR review today S/p prior atach and CTI ablation  2. Atrial fibrillation Diagnosed by ILR -- no episodes during last several months of monitoring V-rates controlled CHADS2VASC is 1. Pt has increased risk of bleeding with frequent falls as well as esophageal varices. Will continue to monitor. May consider ablation in future if burden progresses.  3. HTN Stable No change required today  4. ETOH Avoidance advised  5. Overweight Body mass index is 30.25 kg/m. Has made progress with weight loss.  Risks, benefits and potential toxicities for medications prescribed and/or refilled reviewed with patient today.   Return in a year  Eulas FORBES Nancey, MD  07/20/2023 4:25 PM

## 2023-07-20 NOTE — Telephone Encounter (Signed)
 Pt last saw Dr. Vear 06/11/23. Per note: Change Pregabalin . You are now on 200 mg three times a day.  I sent in a prescription for the 150 mg tablet:For one week: Take 200 mg in morning, 150 mg in afternoon and 200 mg at night. For next week: Take 150 mg in morning, 150 mg in afternoon and 200 mg at night. Then take 150 mg three times a day    I called pt at [phone]. He spoke w/ Dr. Vear about reducing dose of Lyrica . However, he does not want to do this.  Wants to continue taking 200mg  po TID.   Hospital placed him on Trileptal  and Keppra . He wanted to d/c these medications but could not get in touch with anyone to discuss how to wean off so he decided to wean himself slowly and should be off these two meds by the end of this week. He wants refill of Lyrica  200mg  TID sent to pharmacy. Pharmacy: [facility] - [place], [place] - [address]   Aware I will send to Dr. Vear to review/approve.

## 2023-07-22 ENCOUNTER — Encounter (HOSPITAL_BASED_OUTPATIENT_CLINIC_OR_DEPARTMENT_OTHER)
Admission: RE | Disposition: A | Discharge status: Home / Self Care | Payer: Self-pay | Source: Home / Self Care | Attending: Orthopaedic Surgery

## 2023-07-22 ENCOUNTER — Encounter (HOSPITAL_BASED_OUTPATIENT_CLINIC_OR_DEPARTMENT_OTHER): Payer: Self-pay | Admitting: Anesthesiology

## 2023-07-22 ENCOUNTER — Encounter (HOSPITAL_BASED_OUTPATIENT_CLINIC_OR_DEPARTMENT_OTHER): Payer: Self-pay | Admitting: Orthopaedic Surgery

## 2023-07-22 ENCOUNTER — Ambulatory Visit (HOSPITAL_BASED_OUTPATIENT_CLINIC_OR_DEPARTMENT_OTHER)
Admission: RE | Admit: 2023-07-22 | Discharge: 2023-07-22 | Disposition: A | Discharge status: Home / Self Care | Payer: MEDICAID | Attending: Orthopaedic Surgery | Admitting: Orthopaedic Surgery

## 2023-07-22 ENCOUNTER — Other Ambulatory Visit: Payer: Self-pay

## 2023-07-22 DIAGNOSIS — G5602 Carpal tunnel syndrome, left upper limb: Secondary | ICD-10-CM | POA: Diagnosis not present

## 2023-07-22 DIAGNOSIS — Z79899 Other long term (current) drug therapy: Secondary | ICD-10-CM | POA: Diagnosis not present

## 2023-07-22 DIAGNOSIS — Z87891 Personal history of nicotine dependence: Secondary | ICD-10-CM | POA: Insufficient documentation

## 2023-07-22 HISTORY — PX: CARPAL TUNNEL RELEASE: SHX101

## 2023-07-22 SURGERY — CARPAL TUNNEL RELEASE
Anesthesia: LOCAL | Site: Wrist | Laterality: Left

## 2023-07-22 MED ORDER — MIDAZOLAM HCL 2 MG/2ML IJ SOLN
INTRAMUSCULAR | Status: AC
  Filled 2023-07-22: qty 2

## 2023-07-22 MED ORDER — FENTANYL CITRATE (PF) 100 MCG/2ML IJ SOLN
INTRAMUSCULAR | Status: AC
  Filled 2023-07-22: qty 2

## 2023-07-22 MED ORDER — LIDOCAINE-EPINEPHRINE (PF) 1 %-1:200000 IJ SOLN
INTRAMUSCULAR | Status: DC | PRN
  Administered 2023-07-22: 10 mL

## 2023-07-22 SURGICAL SUPPLY — 40 items
BAND RUBBER #18 3X1/16 STRL (MISCELLANEOUS) ×2 IMPLANT
BLADE MINI RND TIP GREEN BEAV (BLADE) ×1 IMPLANT
BLADE SURG 15 STRL LF DISP TIS (BLADE) ×1 IMPLANT
BNDG ELASTIC 3INX 5YD STR LF (GAUZE/BANDAGES/DRESSINGS) ×1 IMPLANT
BNDG ESMARK 4X9 LF (GAUZE/BANDAGES/DRESSINGS) ×1 IMPLANT
BRUSH SCRUB EZ PLAIN DRY (MISCELLANEOUS) ×1 IMPLANT
CANISTER SUCT 1200ML W/VALVE (MISCELLANEOUS) ×1 IMPLANT
CORD BIPOLAR FORCEPS 12FT (ELECTRODE) ×1 IMPLANT
COVER BACK TABLE 60X90IN (DRAPES) ×1 IMPLANT
CUFF TOURN SGL QUICK 18X4 (TOURNIQUET CUFF) ×1 IMPLANT
DRAPE EXTREMITY T 121X128X90 (DISPOSABLE) ×1 IMPLANT
DRAPE SURG 17X23 STRL (DRAPES) ×1 IMPLANT
DRAPE U-SHAPE 47X51 STRL (DRAPES) ×1 IMPLANT
DURAPREP 26ML APPLICATOR (WOUND CARE) ×1 IMPLANT
GAUZE SPONGE 4X4 12PLY STRL (GAUZE/BANDAGES/DRESSINGS) ×1 IMPLANT
GAUZE XEROFORM 1X8 LF (GAUZE/BANDAGES/DRESSINGS) ×1 IMPLANT
GLOVE BIOGEL PI IND STRL 7.5 (GLOVE) ×1 IMPLANT
GLOVE ECLIPSE 7.0 STRL STRAW (GLOVE) ×1 IMPLANT
GLOVE INDICATOR 7.0 STRL GRN (GLOVE) ×1 IMPLANT
GLOVE SURG SYN 7.5 E (GLOVE) ×2 IMPLANT
GLOVE SURG SYN 7.5 PF PI (GLOVE) ×2 IMPLANT
GOWN STRL REUS W/ TWL LRG LVL3 (GOWN DISPOSABLE) ×1 IMPLANT
GOWN STRL SURGICAL XL XLNG (GOWN DISPOSABLE) ×2 IMPLANT
NDL HYPO 25X1 1.5 SAFETY (NEEDLE) ×1 IMPLANT
NEEDLE HYPO 25X1 1.5 SAFETY (NEEDLE) ×1 IMPLANT
NS IRRIG 1000ML POUR BTL (IV SOLUTION) ×1 IMPLANT
PACK BASIN DAY SURGERY FS (CUSTOM PROCEDURE TRAY) ×1 IMPLANT
PAD CAST 3X4 CTTN HI CHSV (CAST SUPPLIES) ×1 IMPLANT
SHEET MEDIUM DRAPE 40X70 STRL (DRAPES) ×1 IMPLANT
SOL PREP POV-IOD 4OZ 10% (MISCELLANEOUS) ×1 IMPLANT
SOLUTION SCRB POV-IOD 4OZ 7.5% (MISCELLANEOUS) ×1 IMPLANT
SPIKE FLUID TRANSFER (MISCELLANEOUS) IMPLANT
SPONGE T-LAP 18X18 ~~LOC~~+RFID (SPONGE) ×1 IMPLANT
STOCKINETTE 4X48 STRL (DRAPES) ×1 IMPLANT
SUT ETHILON 3 0 PS 1 (SUTURE) ×1 IMPLANT
SYR BULB EAR ULCER 3OZ GRN STR (SYRINGE) ×1 IMPLANT
SYR CONTROL 10ML LL (SYRINGE) IMPLANT
TOWEL GREEN STERILE FF (TOWEL DISPOSABLE) ×1 IMPLANT
TRAY DSU PREP LF (CUSTOM PROCEDURE TRAY) ×1 IMPLANT
UNDERPAD 30X36 HEAVY ABSORB (UNDERPADS AND DIAPERS) ×1 IMPLANT

## 2023-07-22 NOTE — Telephone Encounter (Signed)
 Pt states he is still waiting for Dr Vear to call in the Lyrica  200mg  to the [facility] - [place], [place] - [address]  since Dr Vear said it would be ok for him to stay on this.

## 2023-07-22 NOTE — Telephone Encounter (Signed)
 Per phone note on 07/20/23    Dispensed Days Supply Quantity Provider Pharmacy  pregabalin  150 mg capsule 06/11/2023 45 90 each Sater, Charlie LABOR, MD [facility] [place], ...     Rx pending to be sign

## 2023-07-22 NOTE — H&P (Signed)
 PREOPERATIVE H&P  Chief Complaint: left carpal tunnel syndrome  HPI: Earl [name] is a 63 y.o. male who presents for surgical treatment of left carpal tunnel syndrome.  He denies any changes in medical history.  Past Surgical History:  Procedure Laterality Date   A-FLUTTER ABLATION N/A 07/31/2016   Procedure: A-Flutter Ablation;  Surgeon: Kelsie Agent, MD;  [facility];  Service: Cardiovascular;  Laterality: N/A;   BIOPSY  05/08/2022   Procedure: BIOPSY;  Surgeon: Charlanne Groom, MD;  [facility];  Service: Gastroenterology;;   COLONOSCOPY N/A 04/17/2016   Procedure: COLONOSCOPY;  Surgeon: Claudis RAYMOND Rivet, MD;  [facility];  Service: Endoscopy;  Laterality: N/A;  1200   COLONOSCOPY N/A 05/07/2017   Procedure: COLONOSCOPY;  Surgeon: Rivet Claudis RAYMOND, MD;  [facility];  Service: Endoscopy;  Laterality: N/A;  200   COLONOSCOPY WITH PROPOFOL  N/A 07/25/2020   Procedure: COLONOSCOPY WITH PROPOFOL ;  Surgeon: Rivet Claudis RAYMOND, MD;  [facility];  Service: Endoscopy;  Laterality: N/A;  12:10   ESOPHAGEAL BANDING  07/25/2020   Procedure: ESOPHAGEAL BANDING;  Surgeon: Rivet Claudis RAYMOND, MD;  [facility];  Service: Endoscopy;;   ESOPHAGOGASTRODUODENOSCOPY (EGD) WITH PROPOFOL  N/A 07/25/2020   Procedure: ESOPHAGOGASTRODUODENOSCOPY (EGD) WITH PROPOFOL ;  Surgeon: Rivet Claudis RAYMOND, MD;  [facility];  Service: Endoscopy;  Laterality: N/A;   ESOPHAGOGASTRODUODENOSCOPY (EGD) WITH PROPOFOL  N/A 04/11/2021   Procedure: ESOPHAGOGASTRODUODENOSCOPY (EGD) WITH PROPOFOL ;  Surgeon: Rivet Claudis RAYMOND, MD;  [facility];  Service: Endoscopy;  Laterality: N/A;  1:00   ESOPHAGOGASTRODUODENOSCOPY (EGD) WITH PROPOFOL  N/A 05/08/2022   Procedure: ESOPHAGOGASTRODUODENOSCOPY (EGD) WITH PROPOFOL ;  Surgeon: Charlanne Groom, MD;  [facility];  Service: Gastroenterology;  Laterality: N/A;   implantable loop recorder placement   01/24/2019    Medtronic Reveal Linq model LNQ 11 implantable loop recorder (SN [id number] ) by Dr Kelsie for evaluation of palpitations   MINOR CARPAL TUNNEL     right   POLYPECTOMY  04/17/2016   Procedure: POLYPECTOMY;  Surgeon: Claudis RAYMOND Rivet, MD;  [facility];  Service: Endoscopy;;  sigmoid   POLYPECTOMY  07/25/2020   Procedure: POLYPECTOMY;  Surgeon: Rivet Claudis RAYMOND, MD;  [facility];  Service: Endoscopy;;   Social History   Socioeconomic History   Marital status: Single    Spouse name: Not on file   Number of children: 0   Years of education: Not on file   Highest education level: Not on file  Occupational History   Occupation: retired/disabled  Tobacco Use   Smoking status: Former    Current packs/day: 0.00    Types: Cigarettes    Quit date: 03/03/1979    Years since quitting: 44.4   Smokeless tobacco: Never  Vaping Use   Vaping status: Never Used  Substance and Sexual Activity   Alcohol use: Not Currently    Alcohol/week: 8.0 standard drinks of alcohol    Types: 8 Cans of beer per week    Comment: every other day   Drug use: No   Sexual activity: Not on file  Other Topics Concern   Not on file  Social History Narrative   Disabled   Lives in [place]   Social Drivers of Health   Financial Resource Strain: Medium Risk (04/25/2023)   Overall Financial Resource Strain (CARDIA)    Difficulty of Paying Living Expenses: Somewhat hard  Food Insecurity: No Food Insecurity (05/06/2023)  Hunger Vital Sign    Worried About Running Out of Food in the Last Year: Never true    Ran Out of Food in the Last Year: Never true  Recent Concern: Food Insecurity - Food Insecurity Present (05/01/2023)   Hunger Vital Sign    Worried About Running Out of Food in the Last Year: Sometimes true    Ran Out of Food in the Last Year: Never true  Transportation Needs: No Transportation Needs (05/06/2023)   PRAPARE - Administrator, Civil Service (Medical): No     Lack of Transportation (Non-Medical): No  Physical Activity: Sufficiently Active (04/25/2023)   Exercise Vital Sign    Days of Exercise per Week: 7 days    Minutes of Exercise per Session: 150+ min  Stress: Stress Concern Present (04/25/2023)   [employer] of Occupational Health - Occupational Stress Questionnaire    Feeling of Stress : Very much  Social Connections: Socially Isolated (04/25/2023)   Social Connection and Isolation Panel    Frequency of Communication with Friends and Family: More than three times a week    Frequency of Social Gatherings with Friends and Family: Never    Attends Religious Services: Never    Database administrator or Organizations: No    Attends Engineer, structural: Not on file    Marital Status: Never married   Family History  Problem Relation Age of Onset   Alzheimer's disease Mother    Hyperlipidemia Mother    Gallbladder disease Mother    Heart disease Father    Coronary artery disease Father    Liver disease Father    Thyroid  disease Father    Hyperlipidemia Father    Heart attack Father    Cirrhosis Father    Gallbladder disease Sister    Coronary artery disease Paternal Grandfather    Allergies  Allergen Reactions   Amitriptyline Anaphylaxis   Amoxicillin Anaphylaxis and Rash   Asa [Aspirin] Anaphylaxis   Penicillin G Anaphylaxis   Almecillin    Clonidine Derivatives Other (See Comments)    Severe Dry Mouth   Hydrochlorothiazide     Caused rectal bleeding and hematuria   Levothyroxine  Nausea And Vomiting    Patient states that the 75 mcg is too strong for him.   Trintellix  [Vortioxetine ] Nausea And Vomiting   Prior to Admission medications   Medication Sig Start Date End Date Taking? Authorizing Provider  ALPRAZolam  (XANAX ) 1 MG tablet One po every day prn 06/28/23  Yes Sater, Charlie LABOR, MD  diltiazem  (CARDIZEM  CD) 240 MG 24 hr capsule Take 1 capsule (240 mg total) by mouth daily. 05/03/23  Yes Tobie Yetta HERO, MD   levETIRAcetam  (KEPPRA ) 750 MG tablet Take 1 tablet (750 mg total) by mouth 2 (two) times daily. Patient not taking: Reported on 07/20/2023 05/08/23  Yes Samtani, Jai-Gurmukh, MD  oxcarbazepine  (TRILEPTAL ) 600 MG tablet Take 1 tablet (600 mg total) by mouth 2 (two) times daily. Patient not taking: Reported on 07/20/2023 05/08/23  Yes Samtani, Jai-Gurmukh, MD  carvedilol (COREG) 3.125 MG tablet Take 3.125 mg by mouth 2 (two) times daily with a meal. 07/14/23 07/13/24  [provider]  clotrimazole  (LOTRIMIN ) 1 % cream Apply topically 2 (two) times daily. 06/18/23   Lavell Lye A, FNP  cyanocobalamin  (VITAMIN B12) 1000 MCG/ML injection Inject 1 mL (1,000 mcg total) into the muscle every 30 (thirty) days. 12/29/22   Sater, Charlie LABOR, MD  feeding supplement (ENSURE ENLIVE / ENSURE PLUS) LIQD  Take 237 mLs by mouth 3 (three) times daily between meals. Patient taking differently: Take 237 mLs by mouth daily. 05/03/23   Tobie Yetta HERO, MD  LYRICA  200 MG capsule TAKE THREE CAPSULES BY MOUTH DAILY 07/22/23   Sater, Charlie LABOR, MD  ondansetron  (ZOFRAN ) 4 MG tablet TAKE 1 TABLET BY MOUTH TWICE DAILY AS NEEDED FOR NAUSEA OR FOR VOMITING 07/18/21   Rehman, Claudis PENNER, MD  ondansetron  (ZOFRAN ) 4 MG tablet Take 1 tablet (4 mg total) by mouth every 8 (eight) hours as needed for nausea or vomiting. 07/14/23   Jule Ronal CROME, PA-C  oxyCODONE  (ROXICODONE ) 5 MG immediate release tablet Take 1/2 to one pill po tid prn pain.  To be taken as needed after surgery 07/14/23   Jule Ronal CROME, PA-C  pantoprazole  (PROTONIX ) 40 MG tablet Take 1 tablet (40 mg total) by mouth daily before breakfast. Patient taking differently: Take 40 mg by mouth daily as needed (For acid reflux). 04/28/23   Lavell Bari LABOR, FNP  thiamine  (VITAMIN B-1) 100 MG tablet Take 1 tablet (100 mg total) by mouth daily. 05/08/23   Samtani, Jai-Gurmukh, MD     Positive ROS: All other systems have been reviewed and were otherwise negative with the exception  of those mentioned in the HPI and as above.  Physical Exam: General: Alert, no acute distress Cardiovascular: No pedal edema Respiratory: No cyanosis, no use of accessory musculature GI: abdomen soft Skin: No lesions in the area of chief complaint Neurologic: Sensation intact distally Psychiatric: Patient is competent for consent with normal mood and affect Lymphatic: no lymphedema  MUSCULOSKELETAL: exam stable  Assessment: left carpal tunnel syndrome  Plan: Plan for Procedure(s): CARPAL TUNNEL RELEASE  The risks benefits and alternatives were discussed with the patient including but not limited to the risks of nonoperative treatment, versus surgical intervention including infection, bleeding, nerve injury,  blood clots, cardiopulmonary complications, morbidity, mortality, among others, and they were willing to proceed.   Ozell Cummins, MD 07/22/2023 11:17 AM

## 2023-07-22 NOTE — Telephone Encounter (Signed)
 Refill was approved and sent via refill request from pharmacy.

## 2023-07-22 NOTE — Addendum Note (Signed)
 Addended by: DOUGLASS DELON CROME on: 07/22/2023 10:47 AM   Modules accepted: Orders

## 2023-07-22 NOTE — Op Note (Signed)
   Carpal tunnel op note  DATE OF SURGERY:07/22/2023  PREOPERATIVE DIAGNOSIS:  Left carpal tunnel syndrome  POSTOPERATIVE DIAGNOSIS: same  PROCEDURE: Left carpal tunnel release. CPT 35278  SURGEON: Kay Ozell Cummins, M.D.  ASSIST: Ronal Morna Grave, [place]  ANESTHESIA:  Local  TOURNIQUET TIME: not used  BLOOD LOSS: Minimal.  COMPLICATIONS: None.  PATHOLOGY: None.  INDICATIONS: The patient is a 63 y.o. -year-old male who presented with carpal tunnel syndrome failing nonsurgical management, indicated for surgical release.  DESCRIPTION OF PROCEDURE: The patient was identified in the preoperative holding area.  The operative site was marked by the surgeon and confirmed by the patient.  The patient was brought back to the operating room.  Local anesthetic with epi was injected into the operative site.  The operative extremity was prepped and draped in standard sterile fashion.  A timeout was performed.     A palmar incision was made about 5 mm ulnar to the thenar crease.  The palmar aponeurosis was exposed and divided in line with the skin incision. The palmaris brevis was visualized and divided.  The distal edge of the transcarpal ligament was identified. A hemostat was inserted into the carpal tunnel to protect the median nerve and the flexor tendons. Then, the transverse carpal ligament was released under direct visualization. Proximally, a subcutaneous tunnel was made allowing a Sewell retractor to be placed. Then, the distal portion of the antebrachial fascia was released. Distally, all fibrous bands were released. The median nerve was visualized, and the fat pad was exposed. Wound was irrigated and closed with 3-0 nylon sutures. Sterile dressing applied. The patient was transferred to the recovery room in stable condition after all counts were correct.  POSTOPERATIVE PLAN: To start nerve gliding exercises as tolerated and no heavy lifting for four weeks.  Ozell Cummins,  M.D. [facility] 2:16 PM

## 2023-07-22 NOTE — Discharge Instructions (Signed)
 Postoperative instructions:  Weightbearing instructions: don't lift more than 10 lbs for 4 weeks  Dressing instructions: Keep your dressing and/or splint clean and dry at all times.  It will be removed at your first post-operative appointment.  Your stitches and/or staples will be removed at this visit.  Incision instructions:  Do not soak your incision for 3 weeks after surgery.  If the incision gets wet, pat dry and do not scrub the incision.  Pain control:  You have been given a prescription to be taken as directed for post-operative pain control.  In addition, elevate the operative extremity above the heart at all times to prevent swelling and throbbing pain.  Take over-the-counter Colace, 100mg  by mouth twice a day while taking narcotic pain medications to help prevent constipation.  Follow up appointments: 1) 10 days for suture removal and wound check. 2) Dr. Christiane Cowing as scheduled.   -------------------------------------------------------------------------------------------------------------  After Surgery Pain Control:  After your surgery, post-surgical discomfort or pain is likely. This discomfort can last several days to a few weeks. At certain times of the day your discomfort may be more intense.  Did you receive a nerve block?  A nerve block can provide pain relief for one hour to two days after your surgery. As long as the nerve block is working, you will experience little or no sensation in the area the surgeon operated on.  As the nerve block wears off, you will begin to experience pain or discomfort. It is very important that you begin taking your prescribed pain medication before the nerve block fully wears off. Treating your pain at the first sign of the block wearing off will ensure your pain is better controlled and more tolerable when full-sensation returns. Do not wait until the pain is intolerable, as the medicine will be less effective. It is better to treat pain in  advance than to try and catch up.  General Anesthesia:  If you did not receive a nerve block during your surgery, you will need to start taking your pain medication shortly after your surgery and should continue to do so as prescribed by your surgeon.  Pain Medication:  Most commonly we prescribe Vicodin and Percocet for post-operative pain. Both of these medications contain a combination of acetaminophen  (Tylenol ) and a narcotic to help control pain.   It takes between 30 and 45 minutes before pain medication starts to work. It is important to take your medication before your pain level gets too intense.   Nausea is a common side effect of many pain medications. You will want to eat something before taking your pain medicine to help prevent nausea.   If you are taking a prescription pain medication that contains acetaminophen , we recommend that you do not take additional over the counter acetaminophen  (Tylenol ).  Other pain relieving options:   Using a cold pack to ice the affected area a few times a day (15 to 20 minutes at a time) can help to relieve pain, reduce swelling and bruising.   Elevation of the affected area can also help to reduce pain and swelling.  Per [facility] clinic policy, our goal is ensure optimal postoperative pain control with a multimodal pain management strategy. For all [facility] patients, our goal is to wean post-operative narcotic medications by 6 weeks post-operatively. If this is not possible due to utilization of pain medication prior to surgery, your [facility] doctor will support your acute post-operative pain control for the first 6 weeks postoperatively, with a plan  to transition you back to your primary pain team following that. Max Spain will work to ensure a Therapist, occupational.

## 2023-07-23 ENCOUNTER — Encounter (HOSPITAL_BASED_OUTPATIENT_CLINIC_OR_DEPARTMENT_OTHER): Payer: Self-pay | Admitting: Orthopaedic Surgery

## 2023-07-29 ENCOUNTER — Encounter: Payer: Self-pay | Admitting: Physician Assistant

## 2023-07-29 ENCOUNTER — Ambulatory Visit (INDEPENDENT_AMBULATORY_CARE_PROVIDER_SITE_OTHER): Payer: MEDICAID | Admitting: Physician Assistant

## 2023-07-29 DIAGNOSIS — G5602 Carpal tunnel syndrome, left upper limb: Secondary | ICD-10-CM

## 2023-07-29 DIAGNOSIS — Z9889 Other specified postprocedural states: Secondary | ICD-10-CM

## 2023-07-29 NOTE — Progress Notes (Signed)
 Post-Op Visit Note   Patient: Earl [name]           Date of Birth: Apr 11, 1960           MRN: [id number] Visit Date: 07/29/2023 PCP: Lavell Bari LABOR, FNP   Assessment & Plan:  Chief Complaint:  Chief Complaint  Patient presents with   Left Wrist - Follow-up    Left carpal tunnel release 07/22/2023   Visit Diagnoses:  1. Left carpal tunnel syndrome   2. S/P carpal tunnel release     Plan: Patient is a pleasant 63 year old gentleman who comes in today 1 week status post left carpal tunnel release 07/22/2023.  He has been doing well.  No pain.  He notes a little stinging sensation but denies any numbness, tingling or burning.  Examination of his left hand reveals a well-healing surgical incision with nylon sutures in place.  No evidence of infection or cellulitis.  Fingers warm well-perfused.  He is neurovascular intact distally.  Today, his wound was cleaned and recovered.  He will begin nerve gliding exercises.  He has a Velcro splint at home which she will apply and wear at all times for the next week.  No heavy lifting or submerging his hand underwater for 3 more weeks.  Follow-up next week for suture removal.  Call with concerns or questions.  Follow-Up Instructions: Return in about 1 week (around 08/05/2023).   Orders:  No orders of the defined types were placed in this encounter.  No orders of the defined types were placed in this encounter.   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   Left carpal tunnel syndrome 06/10/2022   Esophageal varices (HCC) 12/09/2021   Paroxysmal atrial fibrillation (HCC) 12/03/2021   Involuntary movements 09/26/2021   Alcoholic cirrhosis (HCC) 06/19/2020   Malignant neoplasm of sigmoid colon (HCC) 11/10/2019   SVT (supraventricular tachycardia) (HCC)    NSVT (nonsustained ventricular tachycardia) (HCC)     Lactic acidosis 07/26/2019   Other ascites 07/26/2019   Noncompliance by refusing service 03/02/2019   Alcohol abuse, 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   Depression, recurrent (HCC) 01/05/2019   Overweight (BMI 25.0-29.9) 10/13/2018   Mixed hyperlipidemia 08/17/2018   Vitamin D  deficiency 08/17/2018   Subclinical hypothyroidism 08/17/2018   Abnormal LFTs 07/29/2018   Numbness 05/19/2017   History of colon cancer 04/16/2017   Palpitations 03/09/2017   Atrial flutter (HCC) 03/09/2017   Hematochezia 11/30/2015   Neuropathy 08/30/2015   Rectal bleeding 08/30/2015   Essential hypertension 08/30/2015   Abnormal brain MRI 08/30/2015   Vitamin B 12 deficiency 08/30/2015   Past Medical History:  Diagnosis Date   Alcohol abuse    Anemia    Anxiety    Arthritis    Colon cancer (HCC)    Depression    Dysrhythmia    Gall stones    Heart burn    History of kidney stones    Hyperlipidemia    Hypertension    Hypothyroidism    Liver disease    Pernicious anemia    Tachycardia    Typical atrial flutter (HCC)    Vitamin B12 deficiency     Family History  Problem Relation Age of Onset   Alzheimer's disease Mother    Hyperlipidemia Mother    Gallbladder disease Mother    Heart disease Father    Coronary artery  disease Father    Liver disease Father    Thyroid  disease Father    Hyperlipidemia Father    Heart attack Father    Cirrhosis Father    Gallbladder disease Sister    Coronary artery disease Paternal Grandfather     Past Surgical History:  Procedure Laterality Date   A-FLUTTER ABLATION N/A 07/31/2016   Procedure: A-Flutter Ablation;  Surgeon: Kelsie Agent, MD;  [facility];  Service: Cardiovascular;  Laterality: N/A;   BIOPSY  05/08/2022   Procedure: BIOPSY;  Surgeon: Charlanne Groom, MD;  Location: [facility];  Service: Gastroenterology;;   ORIN MEDIATE RELEASE Left 07/22/2023   Procedure: CARPAL TUNNEL RELEASE;  Surgeon: Jerri Kay HERO, MD;  Location: [facility];  Service: Orthopedics;  Laterality: Left;   COLONOSCOPY N/A 04/17/2016   Procedure: COLONOSCOPY;  Surgeon: Claudis RAYMOND Rivet, MD;  [facility];  Service: Endoscopy;  Laterality: N/A;  1200   COLONOSCOPY N/A 05/07/2017   Procedure: COLONOSCOPY;  Surgeon: Rivet Claudis RAYMOND, MD;  [facility];  Service: Endoscopy;  Laterality: N/A;  200   COLONOSCOPY WITH PROPOFOL  N/A 07/25/2020   Procedure: COLONOSCOPY WITH PROPOFOL ;  Surgeon: Rivet Claudis RAYMOND, MD;  [facility];  Service: Endoscopy;  Laterality: N/A;  12:10   ESOPHAGEAL BANDING  07/25/2020   Procedure: ESOPHAGEAL BANDING;  Surgeon: Rivet Claudis RAYMOND, MD;  [facility];  Service: Endoscopy;;   ESOPHAGOGASTRODUODENOSCOPY (EGD) WITH PROPOFOL  N/A 07/25/2020   Procedure: ESOPHAGOGASTRODUODENOSCOPY (EGD) WITH PROPOFOL ;  Surgeon: Rivet Claudis RAYMOND, MD;  [facility];  Service: Endoscopy;  Laterality: N/A;   ESOPHAGOGASTRODUODENOSCOPY (EGD) WITH PROPOFOL  N/A 04/11/2021   Procedure: ESOPHAGOGASTRODUODENOSCOPY (EGD) WITH PROPOFOL ;  Surgeon: Rivet Claudis RAYMOND, MD;  [facility];  Service: Endoscopy;  Laterality: N/A;  1:00   ESOPHAGOGASTRODUODENOSCOPY (EGD) WITH PROPOFOL  N/A 05/08/2022   Procedure: ESOPHAGOGASTRODUODENOSCOPY (EGD) WITH PROPOFOL ;  Surgeon: Charlanne Groom, MD;  [facility];  Service: Gastroenterology;  Laterality: N/A;   implantable loop recorder placement  01/24/2019    Medtronic Reveal Linq model LNQ 11 implantable loop recorder (SN [id number] ) by Dr Kelsie for evaluation of palpitations   MINOR CARPAL TUNNEL     right   POLYPECTOMY  04/17/2016   Procedure: POLYPECTOMY;  Surgeon: Claudis RAYMOND Rivet, MD;  [facility];  Service: Endoscopy;;  sigmoid   POLYPECTOMY  07/25/2020   Procedure: POLYPECTOMY;  Surgeon: Rivet Claudis RAYMOND, MD;  [facility];  Service: Endoscopy;;   Social History   Occupational History   Occupation: retired/disabled   Tobacco Use   Smoking status: Former    Current packs/day: 0.00    Types: Cigarettes    Quit date: 03/03/1979    Years since quitting: 44.4   Smokeless tobacco: Never  Vaping Use   Vaping status: Never Used  Substance and Sexual Activity   Alcohol use: Not Currently    Alcohol/week: 8.0 standard drinks of alcohol    Types: 8 Cans of beer per week    Comment: every other day   Drug use: No   Sexual activity: Not on file

## 2023-07-30 ENCOUNTER — Telehealth: Payer: Self-pay | Admitting: Gastroenterology

## 2023-07-30 ENCOUNTER — Encounter: Payer: MEDICAID | Admitting: Gastroenterology

## 2023-07-30 NOTE — Telephone Encounter (Signed)
 For now, please put him on first available appointment If any cancellations, lets bring him early RG

## 2023-07-30 NOTE — Telephone Encounter (Signed)
 Dr. Charlanne,   Pt was being seen for cirrhosis of liver with ascites. He ate today causing procedure cancellation. Your next available appt for egd is not until September. Pt wanting to know if he can wait that long to have procedure. Please Advise.

## 2023-07-30 NOTE — Telephone Encounter (Signed)
 PT scheduled for an EGD at 1230pm and he ate food an hour prior to arrival. PT will call back in to schedule. I did notify the PT that the next availability is in September. He wanted to know if he should wait that long with his diagnosis. Please advise.

## 2023-08-03 NOTE — Telephone Encounter (Signed)
 Called and spoke with patient. Dr. Charlanne had a cancellation on Monday, 08/10/23 arriving at 2 pm in the [facility]. Patient accepted this appointment, he will contact [facility] for a care partner. Patient has been advised that he is not allowed to have any solid foods after midnight and he can have clear liquids until 11 am the day of his procedure. Patient states that he forgot about his previous appt because transportation did not call and he did not get a reminder call from our office. I confirmed appt with patient EGD in the [facility] on Monday, 08/10/23 and patient verbalized understanding of instructions.

## 2023-08-03 NOTE — Telephone Encounter (Signed)
 Patient called and stated that he would like to speak to the nurse regarding his EGD being scheduled and him having esophogeal issues at this time. Patient is requesting a call back. Please advise.

## 2023-08-03 NOTE — Telephone Encounter (Signed)
 Inbound call from patient, would like to speak to [place], he states [facility] will not drive to [place] which is where patient resides. Patient is calling [place] so she can further advise.

## 2023-08-03 NOTE — Telephone Encounter (Signed)
 Attempted to reach patient by phone twice. Line only rings, no option to leave a vm. Patient has [facility] - they should offer transportation services. Not aware of any other services close to [facility].

## 2023-08-04 ENCOUNTER — Ambulatory Visit (INDEPENDENT_AMBULATORY_CARE_PROVIDER_SITE_OTHER): Payer: MEDICAID | Admitting: Physician Assistant

## 2023-08-04 DIAGNOSIS — G5602 Carpal tunnel syndrome, left upper limb: Secondary | ICD-10-CM

## 2023-08-04 NOTE — Progress Notes (Signed)
 Post-Op Visit Note   Patient: Earl [name]           Date of Birth: [birth year]           MRN: [id number] Visit Date: 08/04/2023 PCP: Lavell Bari LABOR, FNP   Assessment & Plan:  Chief Complaint:  Chief Complaint  Patient presents with   Left Wrist - Follow-up    Left carpal tunnel release 07/22/2023   Visit Diagnoses:  1. Left carpal tunnel syndrome     Plan: Patient is a 63 year old gentleman who comes in today 2 weeks status post left carpal tunnel release.  He has been doing well.  No pain or paresthesias.  Examination of his left hand reveals a well-healing surgical incision with nylon sutures in place.  No evidence of infection or cellulitis.  Fingers are warm well-perfused.  He is neurovascular tact distally.  Today, sutures were removed Steri-Strips applied.  No heavy lifting or submerging his hand underwater for another 2 weeks.  Follow-up in 2 weeks for recheck.  Call with concerns or questions.  Follow-Up Instructions: Return in about 2 weeks (around 08/18/2023).   Orders:  No orders of the defined types were placed in this encounter.  No orders of the defined types were placed in this encounter.   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   Left carpal tunnel syndrome 06/10/2022   Esophageal varices (HCC) 12/09/2021   Paroxysmal atrial fibrillation (HCC) 12/03/2021   Involuntary movements 09/26/2021   Alcoholic cirrhosis (HCC) 06/19/2020   Malignant neoplasm of sigmoid colon (HCC) 11/10/2019   SVT (supraventricular tachycardia) (HCC)    NSVT (nonsustained ventricular tachycardia) (HCC)    Lactic acidosis 07/26/2019   Other ascites 07/26/2019   Noncompliance by refusing service 03/02/2019   Alcohol abuse, 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   Depression, recurrent (HCC) 01/05/2019   Overweight (BMI 25.0-29.9) 10/13/2018   Mixed  hyperlipidemia 08/17/2018   Vitamin D  deficiency 08/17/2018   Subclinical hypothyroidism 08/17/2018   Abnormal LFTs 07/29/2018   Numbness 05/19/2017   History of colon cancer 04/16/2017   Palpitations 03/09/2017   Atrial flutter (HCC) 03/09/2017   Hematochezia 11/30/2015   Neuropathy 08/30/2015   Rectal bleeding 08/30/2015   Essential hypertension 08/30/2015   Abnormal brain MRI 08/30/2015   Vitamin B 12 deficiency 08/30/2015   Past Medical History:  Diagnosis Date   Alcohol abuse    Anemia    Anxiety    Arthritis    Colon cancer (HCC)    Depression    Dysrhythmia    Gall stones    Heart burn    History of kidney stones    Hyperlipidemia    Hypertension    Hypothyroidism    Liver disease    Pernicious anemia    Tachycardia    Typical atrial flutter (HCC)    Vitamin B12 deficiency     Family History  Problem Relation Age of Onset   Alzheimer's disease Mother    Hyperlipidemia Mother    Gallbladder disease Mother    Heart disease Father    Coronary artery disease Father    Liver disease Father    Thyroid  disease Father    Hyperlipidemia Father    Heart attack Father    Cirrhosis Father    Gallbladder disease Sister    Coronary artery disease Paternal Grandfather     Past  Surgical History:  Procedure Laterality Date   A-FLUTTER ABLATION N/A 07/31/2016   Procedure: A-Flutter Ablation;  Surgeon: Kelsie Agent, MD;  Location: [facility];  Service: Cardiovascular;  Laterality: N/A;   BIOPSY  05/08/2022   Procedure: BIOPSY;  Surgeon: Charlanne Groom, MD;  Location: [facility];  Service: Gastroenterology;;   ORIN MEDIATE RELEASE Left 07/22/2023   Procedure: CARPAL TUNNEL RELEASE;  Surgeon: Jerri Kay HERO, MD;  Location: [facility];  Service: Orthopedics;  Laterality: Left;   COLONOSCOPY N/A 04/17/2016   Procedure: COLONOSCOPY;  Surgeon: Claudis RAYMOND Rivet, MD;  Location: [facility];  Service: Endoscopy;  Laterality: N/A;  1200   COLONOSCOPY N/A  05/07/2017   Procedure: COLONOSCOPY;  Surgeon: Rivet Claudis RAYMOND, MD;  Location: [facility];  Service: Endoscopy;  Laterality: N/A;  200   COLONOSCOPY WITH PROPOFOL  N/A 07/25/2020   Procedure: COLONOSCOPY WITH PROPOFOL ;  Surgeon: Rivet Claudis RAYMOND, MD;  Location: [facility];  Service: Endoscopy;  Laterality: N/A;  12:10   ESOPHAGEAL BANDING  07/25/2020   Procedure: ESOPHAGEAL BANDING;  Surgeon: Rivet Claudis RAYMOND, MD;  Location: [facility];  Service: Endoscopy;;   ESOPHAGOGASTRODUODENOSCOPY (EGD) WITH PROPOFOL  N/A 07/25/2020   Procedure: ESOPHAGOGASTRODUODENOSCOPY (EGD) WITH PROPOFOL ;  Surgeon: Rivet Claudis RAYMOND, MD;  Location: [facility];  Service: Endoscopy;  Laterality: N/A;   ESOPHAGOGASTRODUODENOSCOPY (EGD) WITH PROPOFOL  N/A 04/11/2021   Procedure: ESOPHAGOGASTRODUODENOSCOPY (EGD) WITH PROPOFOL ;  Surgeon: Rivet Claudis RAYMOND, MD;  Location: [facility];  Service: Endoscopy;  Laterality: N/A;  1:00   ESOPHAGOGASTRODUODENOSCOPY (EGD) WITH PROPOFOL  N/A 05/08/2022   Procedure: ESOPHAGOGASTRODUODENOSCOPY (EGD) WITH PROPOFOL ;  Surgeon: Charlanne Groom, MD;  Location: [facility];  Service: Gastroenterology;  Laterality: N/A;   implantable loop recorder placement  01/24/2019    [employer]tronic Reveal Linq model LNQ 11 implantable loop recorder (SN [id number] ) by Dr Kelsie for evaluation of palpitations   MINOR CARPAL TUNNEL     right   POLYPECTOMY  04/17/2016   Procedure: POLYPECTOMY;  Surgeon: Claudis RAYMOND Rivet, MD;  Location: [facility];  Service: Endoscopy;;  sigmoid   POLYPECTOMY  07/25/2020   Procedure: POLYPECTOMY;  Surgeon: Rivet Claudis RAYMOND, MD;  Location: [facility];  Service: Endoscopy;;   Social History   Occupational History   Occupation: retired/disabled  Tobacco Use   Smoking status: Former    Current packs/day: 0.00    Types: Cigarettes    Quit date: 03/03/1979    Years since quitting: 44.4   Smokeless tobacco: Never  Vaping Use   Vaping status: Never Used  Substance and  Sexual Activity   Alcohol use: Not Currently    Alcohol/week: 8.0 standard drinks of alcohol    Types: 8 Cans of beer per week    Comment: every other day   Drug use: No   Sexual activity: Not on file

## 2023-08-07 ENCOUNTER — Telehealth: Payer: Self-pay | Admitting: *Deleted

## 2023-08-07 NOTE — Telephone Encounter (Signed)
 See other phone note from 08/07/23 for more details.

## 2023-08-07 NOTE — Telephone Encounter (Signed)
 Pt called stating he was going to arrive at 2pm and his transportation was going to arrive at 3:30pm to take him home after his procedure on 08/09/23, but they would not be able to stay with him. Instructed pt that he had to have a care partner in the building the entire time of his procedure per [facility] policy. Pt very frustrated and states no one told him this information when he was scheduled for the procedure in the office. States he had conversations with office staff and was never told. RN explained the reason for the policy to the pt and that the information regarding a care partner is clearly written in his instructions and that it was his responsibility to read the instructions provided to him. Pt also stated he was mad because the ride service [employer] that he was given information about does not come to [place] where his lives so he had to spend hours on the phone to find a different service. Pt states he does not think he will be able to get in touch with the ride service he hired over the weekend to see if someone can stay. Pt also states he probably won't be able to contact the office on Monday to say if he is not coming for the appointment. RN asked him why he would not be able to call and inform us  and he states because I may be busy all day until I have to come in. RN encouraged pt to call if he is unable to keep his appointment, reminded him we open at 7am on Monday.

## 2023-08-07 NOTE — Telephone Encounter (Signed)
 Called and spoke with patient to confirm that he has transportation for EGD appt on Monday. Patient states that he plans to be dropped off and care partner will come at 3:30 pm. I told patient that they would not be able to start the procedure without a care partner there. I called and spoke with Harlene in the [facility] and she said that she could discuss this further with the patient. Patient call transferred to [facility].

## 2023-08-08 ENCOUNTER — Encounter: Payer: Self-pay | Admitting: Gastroenterology

## 2023-08-10 ENCOUNTER — Encounter: Payer: Self-pay | Admitting: Gastroenterology

## 2023-08-10 ENCOUNTER — Ambulatory Visit (AMBULATORY_SURGERY_CENTER): Payer: MEDICAID | Admitting: Gastroenterology

## 2023-08-10 VITALS — BP 128/71 | HR 49 | Temp 98.2°F | Resp 11 | Ht 70.0 in | Wt 200.0 lb

## 2023-08-10 DIAGNOSIS — K7031 Alcoholic cirrhosis of liver with ascites: Secondary | ICD-10-CM

## 2023-08-10 DIAGNOSIS — K317 Polyp of stomach and duodenum: Secondary | ICD-10-CM

## 2023-08-10 DIAGNOSIS — K3189 Other diseases of stomach and duodenum: Secondary | ICD-10-CM

## 2023-08-10 MED ORDER — SODIUM CHLORIDE 0.9 % IV SOLN: 500.0000 mL | INTRAVENOUS | Status: DC

## 2023-08-10 NOTE — Op Note (Signed)
 [facility] Patient Name: Earl [name] Procedure Date: 08/10/2023 3:12 PM MRN: [id number] Endoscopist: Lynnie Bring , MD, [id number] Age: 63 Referring MD:  Date of Birth: [birth year] Gender: Male Account #: [id number] Procedure:                Upper GI endoscopy Indications:              Screening procedure for EV in ETOH liver cirrhosis                            with portal hypertension. Previous history of EVL Medicines:                Monitored Anesthesia Care Procedure:                Pre-Anesthesia Assessment:                           - Prior to the procedure, a History and Physical                            was performed, and patient medications and                            allergies were reviewed. [name] tolerance of                            previous anesthesia was also reviewed. The risks                            and benefits of the procedure and the sedation                            options and risks were discussed with the patient.                            All questions were answered, and informed consent                            was obtained. Prior Anticoagulants: The patient has                            taken no anticoagulant or antiplatelet agents. ASA                            Grade Assessment: III - A patient with severe                            systemic disease. After reviewing the risks and                            benefits, the patient was deemed in satisfactory                            condition to undergo the procedure.  After obtaining informed consent, the endoscope was                            passed under direct vision. Throughout the                            procedure, the patient's blood pressure, pulse, and                            oxygen saturations were monitored continuously. The                            GIF [id number] #[id number] was introduced through the                            mouth,  and advanced to the second part of duodenum.                            The upper GI endoscopy was accomplished without                            difficulty. The patient tolerated the procedure                            well. Scope In: Scope Out: Findings:                 The examined esophagus was normal. No varices noted.                           The Z-line was regular and was found 40 cm from the                            incisors.                           Mild portal hypertensive gastropathy was found in                            the entire examined stomach. Biopsies were taken                            with a cold forceps for histology.                           A few 2 to 3 mm sessile polyps with no bleeding and                            no stigmata of recent bleeding were found in the                            gastric fundus and in the gastric body. Typical for  hyperplastic polyps.                           The examined duodenum was normal. Complications:            No immediate complications. Estimated Blood Loss:     Estimated blood loss: none. Impression:               - Minimal portal hypertensive gastropathy. Biopsied.                           - No esophageal varices noted. Recommendation:           - Patient has a contact number available for                            emergencies. The signs and symptoms of potential                            delayed complications were discussed with the                            patient. Return to normal activities tomorrow.                            Written discharge instructions were provided to the                            patient.                           - Resume previous diet.                           - Continue present medications.                           - Await pathology results.                           - Continue Coreg 3.125 mg p.o. twice daily (cannot                             advance dose due to borderline heart rate 50-55/min)                           - Avoid ibuprofen, naproxen, or other non-steroidal                            anti-inflammatory drugs.                           - Strictly no alcohol.                           - The findings and recommendations were discussed  with the patient's family. Lynnie Bring, MD 08/10/2023 3:31:26 PM This report has been signed electronically.

## 2023-08-10 NOTE — Patient Instructions (Signed)
 YOU HAD AN ENDOSCOPIC PROCEDURE TODAY AT THE [facility]:   Refer to the procedure report that was given to you for any specific questions about what was found during the examination.  If the procedure report does not answer your questions, please call your gastroenterologist to clarify.  If you requested that your care partner not be given the details of your procedure findings, then the procedure report has been included in a sealed envelope for you to review at your convenience later.  YOU SHOULD EXPECT: Some feelings of bloating in the abdomen. Passage of more gas than usual.  Walking can help get rid of the air that was put into your GI tract during the procedure and reduce the bloating. If you had a lower endoscopy (such as a colonoscopy or flexible sigmoidoscopy) you may notice spotting of blood in your stool or on the toilet paper. If you underwent a bowel prep for your procedure, you may not have a normal bowel movement for a few days.  Please Note:  You might notice some irritation and congestion in your nose or some drainage.  This is from the oxygen used during your procedure.  There is no need for concern and it should clear up in a day or so.  SYMPTOMS TO REPORT IMMEDIATELY:  Following upper endoscopy (EGD)  Vomiting of blood or coffee ground material  New chest pain or pain under the shoulder blades  Painful or persistently difficult swallowing  New shortness of breath  Fever of 100F or higher  Black, tarry-looking stools  Resume previous diet Await pathology results Continue Coreg - 3.123 mg twice daily Avoid ibuprofen, naproxen or other non-steroidal anti-inflammatory drugs Strictly no alcohol    For urgent or emergent issues, a gastroenterologist can be reached at any hour by calling [phone]. Do not use MyChart messaging for urgent concerns.    DIET:  We do recommend a small meal at first, but then you may proceed to your regular diet.  Drink plenty  of fluids but you should avoid alcoholic beverages for 24 hours.  ACTIVITY:  You should plan to take it easy for the rest of today and you should NOT DRIVE or use heavy machinery until tomorrow (because of the sedation medicines used during the test).    FOLLOW UP: Our staff will call the number listed on your records the next business day following your procedure.  We will call around 7:15- 8:00 am to check on you and address any questions or concerns that you may have regarding the information given to you following your procedure. If we do not reach you, we will leave a message.     If any biopsies were taken you will be contacted by phone or by letter within the next 1-3 weeks.  Please call us  at [phone] if you have not heard about the biopsies in 3 weeks.    SIGNATURES/CONFIDENTIALITY: You and/or your care partner have signed paperwork which will be entered into your electronic medical record.  These signatures attest to the fact that that the information above on your After Visit Summary has been reviewed and is understood.  Full responsibility of the confidentiality of this discharge information lies with you and/or your care-partner.

## 2023-08-10 NOTE — Progress Notes (Signed)
 Report given to PACU, vss

## 2023-08-10 NOTE — Progress Notes (Signed)
 06/05/2023 REQUAN [name] [id number] 1960/10/18   Referring provider: Lavell Bari LABOR, FNP Primary GI doctor: Dr. Charlanne   ASSESSMENT AND PLAN:    Cirrhosis 05/08/2023 WBC 5.4 HGB 14.5 Platelets 89 05/01/2023 AST 27 ALT 27 Alkphos 94 TBili 1.3 04/11/2022 INR 1.1 Will get INR to calculate  Serologic workup: 2019, 2020 negative ANA, hepatitis panel, ferritin, will get complete work up but likely from ETOH which patient has stopped Ascites:      Last LVP 06/2019 no SB -Nutrition and low sodium diet discussed with patient and information given - appears euvolemic - he is not on medications Varices screening / surveillance EGD:     Last EGD 05/08/2022  A small scar was found in the lower third of the esophagus from previous EVL. Grade 0- I varices were found in the lower third of the esophagus. Mild portal hypertensive gastropathy was found in the entire examined stomach. Biopsies were taken with a cold forceps for histology. No fundal varices. Incidental note was made of 2- 3 small 4 to 6 mm gastric polyps ( left alone, previously deemed to be hyperplastic) No history of varices. NOT prophylaxis, will schedule for repeat EGD Hepatic encephalopathy:  Pt does not report any symptoms consistent with HE and no asterixis on exam.  Most recent HCC screening:    12/26/2022 MR liver with and without contrast Last AFP remote Provided general information to the patient: -Continue daily multivitamin -Recommended 30 minutes of aerobic and resistance exercise 3 days/week, he is walking, lifting  -Encouraged pt to increase protein intake - [facility]   Afib s/p ablation Not on blood thinner   Personal history of colon cancer No hematochezia, no constipation, diarrhea He underwent a screening colonoscopy in 2018 which identified 1 polyp removed from the sigmoid colon, path report identified invasive adenocarcinoma within a tubular adenomatous polyp.  CTAP was  unremarkable and CEA level was normal.  Her last colonoscopy was 07/25/2020 and it showed 2 show 2 sessile polyps 3 to 5 mm in size external/internal hemorrhoids  Recall colonoscopy 06/2025     Patient Care Team: Lavell Bari LABOR, FNP as PCP - General (Family Medicine) Mealor, Eulas BRAVO, MD as PCP - Electrophysiology (Cardiology)   HISTORY OF PRESENT ILLNESS: 63 y.o. 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 Has history of the following complications from cirrhosis: esophageal varices, portal hypertensive gastropathy, ascites, thrombocytopenia, and hepatic encephalopathy   Last EGD was 2022 recall 2025.  Last HCC screen: 11/2022 MRI normal Last AFP remote  Last INR: 04/11/2022 1.1     Wt Readings from Last 3 Encounters:  06/05/23 200 lb (90.7 kg)  04/28/23 195 lb (88.5 kg)  12/29/22 222 lb 8 oz (100.9 kg)    Discussed the use of AI scribe software for clinical note transcription with the patient, who gave verbal consent to proceed.   History of Present Illness   Earl [name] is a 63 year old male with cirrhosis and a history of varices who presents for follow-up and lab work.   He has a history of cirrhosis with varices that were previously banded. He has not experienced recent issues with varices, melena, or hematochezia. His urine appears normal. He was scheduled to visit the transplant center at [facility] in [place] on June 17th but was hospitalized due to a medication adjustment of Lyrica  from 600 mg to 400 mg, resulting in a seven to eight-day hospital stay.  He reports no abdominal swelling and feels significantly improved compared to a year ago, having lost nine pounds. He engages in regular physical activity, including lifting weights and walking two miles a day, despite experiencing neuropathy. He follows a low sodium diet and has discontinued Lasix  as he feels it is no longer necessary. He abstains from alcohol and is focused on maintaining his liver  health.   He expresses concern about the potential impact of the Moderna COVID-19 vaccine on his liver, noting that his liver failure occurred shortly after receiving the shot. He has no symptoms of hepatic encephalopathy and is not currently taking any medications to manage fluid retention.   He has a history of pernicious anemia and takes B12 shots, although he did not bring his B12 medication to the appointment. He also takes a prescription-strength vitamin D1 once a week. His neuropathy medication causes a sensation of being 'buzzed', necessitating caution to avoid falls.   He has a history of invasive adenocarcinoma found in a colonoscopy in 2018, but his most recent colonoscopy in 2022 showed only sessile polyps and hemorrhoids, with no current symptoms from the hemorrhoids.   He experiences no shortness of breath, chest pain, or significant changes in bowel habits, although he has a lifelong history of constipation that has improved with dietary changes. No diarrhea. 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 He has undergone a heart ablation procedure in the past.     Social history:  He  reports that he quit smoking about 44 years ago. His smoking use included cigarettes. He has never used smokeless tobacco. He reports that he does not currently use alcohol after a past usage of about 8.0 standard drinks of alcohol per week. He reports that he does not use drugs.   RELEVANT GI HISTORY, LABS, IMAGING:   CBC Labs (Brief)          Component Value Date/Time    WBC 5.4 05/08/2023 0732    RBC 4.61 05/08/2023 0732    HGB 14.5 05/08/2023 0732    HGB 15.0 04/28/2023 1449    HCT 41.3 05/08/2023 0732    HCT 44.0 04/28/2023 1449    PLT 89 (L) 05/08/2023 0732    PLT 136 (L) 04/28/2023 1449    MCV 89.6 05/08/2023 0732    MCV 92 04/28/2023 1449    MCH 31.5 05/08/2023 0732    MCHC 35.1 05/08/2023 0732     RDW 13.2 05/08/2023 0732    RDW 12.3 04/28/2023 1449    LYMPHSABS 1.6 05/08/2023 0732    LYMPHSABS 1.5 04/28/2023 1449    MONOABS 0.6 05/08/2023 0732    EOSABS 0.1 05/08/2023 0732    EOSABS 0.1 04/28/2023 1449    BASOSABS 0.0 05/08/2023 0732    BASOSABS 0.0 04/28/2023 1449      Recent Labs (within last 365 days)           Recent Labs    09/05/22 1217 04/28/23 1449 05/01/23 1540 05/02/23 0900 05/03/23 0730 05/07/23 0633 05/08/23 0732  HGB 14.5 15.0 14.8 14.2 13.8 15.8 14.5        CMP     Labs (Brief)          Component Value Date/Time    NA 140 05/08/2023 0732    NA 141 04/28/2023 1449    K 3.6 05/08/2023 0732    CL 106 05/08/2023 0732  CO2 27 05/08/2023 0732    GLUCOSE 88 05/08/2023 0732    BUN 12 05/08/2023 0732    BUN 13 04/28/2023 1449    CREATININE 0.91 05/08/2023 0732    CREATININE 0.83 07/18/2020 1304    CALCIUM  8.7 (L) 05/08/2023 0732    PROT 7.3 05/01/2023 1540    PROT 6.8 04/28/2023 1449    ALBUMIN 4.2 05/01/2023 1540    ALBUMIN 4.2 04/28/2023 1449    AST 27 05/01/2023 1540    ALT 27 05/01/2023 1540    ALKPHOS 94 05/01/2023 1540    BILITOT 1.3 (H) 05/01/2023 1540    BILITOT 0.8 04/28/2023 1449    GFRNONAA >60 05/08/2023 0732    GFRNONAA 71 09/06/2019 1313    GFRAA 82 09/06/2019 1313          Latest Ref Rng & Units 05/01/2023    3:40 PM 04/28/2023    2:49 PM 09/05/2022   12:17 PM  Hepatic Function  Total Protein 6.5 - 8.1 g/dL 7.3  6.8  7.0   Albumin 3.5 - 5.0 g/dL 4.2  4.2  4.2   AST 15 - 41 U/L 27  31  21    ALT 0 - 44 U/L 27  32  21   Alk Phosphatase 38 - 126 U/L 94  132  109   Total Bilirubin 0.0 - 1.2 mg/dL 1.3  0.8  0.7         Latest Ref Rng & Units 07/11/2022    1:08 PM 10/22/2022    3:17 PM 04/07/2023   10:05 AM  Hepatitis C  AFP <6.1 ng/mL 3.6  3.2  2.5       Current Medications:      Current Outpatient Medications (Cardiovascular):    diltiazem  (CARDIZEM  CD) 240 MG 24 hr capsule, Take 1 capsule (240 mg total) by mouth  daily.       Current Outpatient Medications (Hematological):    cyanocobalamin  (VITAMIN B12) 1000 MCG/ML injection, Inject 1 mL (1,000 mcg total) into the muscle every 30 (thirty) days.   Current Outpatient Medications (Other):    [START ON 06/28/2023] ALPRAZolam  (XANAX ) 1 MG tablet, TAKE 1 TABLET BY MOUTH DAILY AS NEEDED FOR ANXIETY (MUST LAST 30 DAYS)   clotrimazole  (LOTRIMIN ) 1 % cream, Apply topically 2 (two) times daily.   feeding supplement (ENSURE ENLIVE / ENSURE PLUS) LIQD, Take 237 mLs by mouth 3 (three) times daily between meals. (Patient taking differently: Take 237 mLs by mouth daily.)   levETIRAcetam  (KEPPRA ) 750 MG tablet, Take 1 tablet (750 mg total) by mouth 2 (two) times daily.   ondansetron  (ZOFRAN ) 4 MG tablet, TAKE 1 TABLET BY MOUTH TWICE DAILY AS NEEDED FOR NAUSEA OR FOR VOMITING   oxcarbazepine  (TRILEPTAL ) 600 MG tablet, Take 1 tablet (600 mg total) by mouth 2 (two) times daily.   pantoprazole  (PROTONIX ) 40 MG tablet, Take 1 tablet (40 mg total) by mouth daily before breakfast. (Patient taking differently: Take 40 mg by mouth daily as needed (For acid reflux).)   pregabalin  (LYRICA ) 200 MG capsule, Take 1 capsule (200 mg total) by mouth in the morning, at noon, and at bedtime. Take 3 capsules (600 mg) po daily.    Prescription must last 30 or more days. (Patient taking differently: Take 200 mg by mouth in the morning, at noon, and at bedtime.)   thiamine  (VITAMIN B-1) 100 MG tablet, Take 1 tablet (100 mg total) by mouth daily.   Medical History:      Past  Medical History:  Diagnosis Date   Alcohol abuse     Anemia     Anxiety     Arthritis     Colon cancer (HCC)     Depression     Dysrhythmia     Gall stones     Heart burn     History of kidney stones     Hyperlipidemia     Hypertension     Hypothyroidism     Liver disease     Pernicious anemia     Tachycardia     Typical atrial flutter (HCC)     Vitamin B12 deficiency          Allergies:  Allergies        Allergies  Allergen Reactions   Amitriptyline Anaphylaxis   Amoxicillin Anaphylaxis and Rash   Asa [Aspirin] Anaphylaxis   Penicillin G Anaphylaxis   Almecillin     Clonidine Derivatives Other (See Comments)      Severe Dry Mouth   Hydrochlorothiazide        Caused rectal bleeding and hematuria   Levothyroxine  Nausea And Vomiting      Patient states that the 75 mcg is too strong for him.   Trintellix  [Vortioxetine ] Nausea And Vomiting        Surgical History:  He  has a past surgical history that includes Minor carpal tunnel; Colonoscopy (N/A, 04/17/2016); polypectomy (04/17/2016); A-FLUTTER ABLATION (N/A, 07/31/2016); Colonoscopy (N/A, 05/07/2017); implantable loop recorder placement (01/24/2019); Colonoscopy with propofol  (N/A, 07/25/2020); Esophagogastroduodenoscopy (egd) with propofol  (N/A, 07/25/2020); polypectomy (07/25/2020); esophageal banding (07/25/2020); Esophagogastroduodenoscopy (egd) with propofol  (N/A, 04/11/2021); Esophagogastroduodenoscopy (egd) with propofol  (N/A, 05/08/2022); and biopsy (05/08/2022). Family History:  His family history includes Alzheimer's disease in his mother; Cirrhosis in his father; Coronary artery disease in his father and paternal grandfather; Gallbladder disease in his mother and sister; Heart attack in his father; Heart disease in his father; Hyperlipidemia in his father and mother; Liver disease in his father; Thyroid  disease in his father.   REVIEW OF SYSTEMS  : All other systems reviewed and negative except where noted in the History of Present Illness.   PHYSICAL EXAM: BP 110/72   Pulse (!) 49   Ht 5' 10 (1.778 m)   Wt 200 lb (90.7 kg)   BMI 28.70 kg/m  Physical Exam   GENERAL APPEARANCE: Well nourished, in no apparent distress HEENT: No cervical lymphadenopathy, unremarkable thyroid , sclerae anicteric, conjunctiva pink RESPIRATORY: Respiratory effort normal, lungs clear to auscultation bilaterally, BS equal bilateral without rales, rhonchi,  wheezing CARDIO: RRR with no MRGs, peripheral pulses intact ABDOMEN: Soft, non distended, active bowel sounds in all 4 quadrants, non-tender, no rebound, no mass appreciated, no ascites RECTAL: declines MUSCULOSKELETAL: Full ROM, normal gait, without edema SKIN: Dry, intact without rashes or lesions. No jaundice. NEURO: Alert, oriented, no focal deficits, normal cerebellar function PSYCH: Cooperative, normal mood and affect. EXTREMITIES: No edema in lower extremities             Alan JONELLE Coombs, PA-C     Attending physician's note   I have taken history, reviewed the chart and examined the patient. I performed a substantive portion of this encounter, including complete performance of at least one of the key components, in conjunction with the APP. I agree with the Advanced Practitioner's note, impression and recommendations.   For EGD today On coreg 3.125mg  po BID   Anselm Bring, MD [facility] [phone]

## 2023-08-10 NOTE — Progress Notes (Signed)
1516 Robinul 0.1 mg IV given due large amount of secretions upon assessment.  MD made aware, vss  °

## 2023-08-11 ENCOUNTER — Telehealth: Payer: Self-pay

## 2023-08-11 NOTE — Telephone Encounter (Signed)
 Attempted to reach patient for follow up phone call. No answer, unable to leave voicemail.

## 2023-08-13 LAB — SURGICAL PATHOLOGY

## 2023-08-18 ENCOUNTER — Encounter: Payer: MEDICAID | Admitting: Physician Assistant

## 2023-08-18 ENCOUNTER — Ambulatory Visit: Payer: Self-pay | Admitting: Gastroenterology

## 2023-08-26 ENCOUNTER — Encounter: Payer: Self-pay | Admitting: Physician Assistant

## 2023-08-26 ENCOUNTER — Ambulatory Visit (INDEPENDENT_AMBULATORY_CARE_PROVIDER_SITE_OTHER): Payer: MEDICAID | Admitting: Physician Assistant

## 2023-08-26 DIAGNOSIS — G5602 Carpal tunnel syndrome, left upper limb: Secondary | ICD-10-CM

## 2023-08-26 DIAGNOSIS — Z9889 Other specified postprocedural states: Secondary | ICD-10-CM

## 2023-08-26 NOTE — Progress Notes (Signed)
 Post-Op Visit Note   Patient: Earl [name]           Date of Birth: [birth year]           MRN: [id number] Visit Date: 08/26/2023 PCP: Lavell Bari LABOR, FNP   Assessment & Plan:  Chief Complaint:  Chief Complaint  Patient presents with   Left Wrist - Follow-up    Left carpal tunnel release 07/22/2023   Visit Diagnoses:  1. Left carpal tunnel syndrome   2. S/P carpal tunnel release     Plan: Patient is a pleasant 63 year old gentleman who comes in today 5 weeks status post left carpal tunnel release.  He has been doing well.  Very little pain and stiffness.  No paresthesias.  Examination of the left hand reveals a fully healed surgical scar without complication.  Fingers are warm well-perfused.  He is neurovascularly intact distally.  This point, he may advance with activity as tolerated.  Follow-up as needed.  Call with concerns or questions.  Follow-Up Instructions: Return if symptoms worsen or fail to improve.   Orders:  No orders of the defined types were placed in this encounter.  No orders of the defined types were placed in this encounter.   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   Left carpal tunnel syndrome 06/10/2022   Esophageal varices (HCC) 12/09/2021   Paroxysmal atrial fibrillation (HCC) 12/03/2021   Involuntary movements 09/26/2021   Alcoholic cirrhosis (HCC) 06/19/2020   Malignant neoplasm of sigmoid colon (HCC) 11/10/2019   SVT (supraventricular tachycardia) (HCC)    NSVT (nonsustained ventricular tachycardia) (HCC)    Lactic acidosis 07/26/2019   Other ascites 07/26/2019   Noncompliance by refusing service 03/02/2019   Alcohol abuse, 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   Depression, recurrent (HCC) 01/05/2019   Overweight (BMI 25.0-29.9) 10/13/2018   Mixed hyperlipidemia 08/17/2018   Vitamin D  deficiency 08/17/2018    Subclinical hypothyroidism 08/17/2018   Abnormal LFTs 07/29/2018   Numbness 05/19/2017   History of colon cancer 04/16/2017   Palpitations 03/09/2017   Atrial flutter (HCC) 03/09/2017   Hematochezia 11/30/2015   Neuropathy 08/30/2015   Rectal bleeding 08/30/2015   Essential hypertension 08/30/2015   Abnormal brain MRI 08/30/2015   Vitamin B 12 deficiency 08/30/2015   Past Medical History:  Diagnosis Date   Alcohol abuse    Allergy    Anemia    Anxiety    Arthritis    Cirrhosis (HCC)    Colon cancer (HCC)    Depression    Dysrhythmia    Gall stones    Heart burn    History of kidney stones    Hyperlipidemia    Hypertension    Hypothyroidism    Liver disease    Pernicious anemia    Seizures (HCC)    Per patient last seizure 04/2023 while cutting back on Lyrica    Tachycardia    Typical atrial flutter (HCC)    Vitamin B12 deficiency     Family History  Problem Relation Age of Onset   Alzheimer's disease Mother    Hyperlipidemia Mother    Gallbladder disease Mother    Heart disease Father    Coronary artery disease Father    Liver disease Father    Thyroid  disease Father    Hyperlipidemia Father    Heart attack Father    Cirrhosis Father  Gallbladder disease Sister    Coronary artery disease Paternal Grandfather    Colon cancer Neg Hx    Esophageal cancer Neg Hx    Rectal cancer Neg Hx    Stomach cancer Neg Hx     Past Surgical History:  Procedure Laterality Date   A-FLUTTER ABLATION N/A 07/31/2016   Procedure: A-Flutter Ablation;  Surgeon: Kelsie Agent, MD;  Location: [facility];  Service: Cardiovascular;  Laterality: N/A;   BIOPSY  05/08/2022   Procedure: BIOPSY;  Surgeon: Charlanne Groom, MD;  Location: [facility];  Service: Gastroenterology;;   ORIN MEDIATE RELEASE Left 07/22/2023   Procedure: CARPAL TUNNEL RELEASE;  Surgeon: Jerri Kay HERO, MD;  Location: [facility];  Service: Orthopedics;  Laterality: Left;   COLONOSCOPY N/A  04/17/2016   Procedure: COLONOSCOPY;  Surgeon: Claudis RAYMOND Rivet, MD;  Location: [facility];  Service: Endoscopy;  Laterality: N/A;  1200   COLONOSCOPY N/A 05/07/2017   Procedure: COLONOSCOPY;  Surgeon: Rivet Claudis RAYMOND, MD;  Location: [facility];  Service: Endoscopy;  Laterality: N/A;  200   COLONOSCOPY WITH PROPOFOL  N/A 07/25/2020   Procedure: COLONOSCOPY WITH PROPOFOL ;  Surgeon: Rivet Claudis RAYMOND, MD;  Location: [facility];  Service: Endoscopy;  Laterality: N/A;  12:10   ESOPHAGEAL BANDING  07/25/2020   Procedure: ESOPHAGEAL BANDING;  Surgeon: Rivet Claudis RAYMOND, MD;  Location: [facility];  Service: Endoscopy;;   ESOPHAGOGASTRODUODENOSCOPY (EGD) WITH PROPOFOL  N/A 07/25/2020   Procedure: ESOPHAGOGASTRODUODENOSCOPY (EGD) WITH PROPOFOL ;  Surgeon: Rivet Claudis RAYMOND, MD;  Location: [facility];  Service: Endoscopy;  Laterality: N/A;   ESOPHAGOGASTRODUODENOSCOPY (EGD) WITH PROPOFOL  N/A 04/11/2021   Procedure: ESOPHAGOGASTRODUODENOSCOPY (EGD) WITH PROPOFOL ;  Surgeon: Rivet Claudis RAYMOND, MD;  Location: [facility];  Service: Endoscopy;  Laterality: N/A;  1:00   ESOPHAGOGASTRODUODENOSCOPY (EGD) WITH PROPOFOL  N/A 05/08/2022   Procedure: ESOPHAGOGASTRODUODENOSCOPY (EGD) WITH PROPOFOL ;  Surgeon: Charlanne Groom, MD;  Location: [facility];  Service: Gastroenterology;  Laterality: N/A;   implantable loop recorder placement  01/24/2019    Medtronic Reveal Linq model LNQ 11 implantable loop recorder (SN [id number] ) by Dr Kelsie for evaluation of palpitations   MINOR CARPAL TUNNEL     right   POLYPECTOMY  04/17/2016   Procedure: POLYPECTOMY;  Surgeon: Claudis RAYMOND Rivet, MD;  Location: [facility];  Service: Endoscopy;;  sigmoid   POLYPECTOMY  07/25/2020   Procedure: POLYPECTOMY;  Surgeon: Rivet Claudis RAYMOND, MD;  Location: [facility];  Service: Endoscopy;;   Social History   Occupational History   Occupation: retired/disabled  Tobacco Use   Smoking status: Former    Current packs/day: 0.00    Types:  Cigarettes    Quit date: 03/03/1979    Years since quitting: 44.5   Smokeless tobacco: Never  Vaping Use   Vaping status: Never Used  Substance and Sexual Activity   Alcohol use: Not Currently    Alcohol/week: 8.0 standard drinks of alcohol    Types: 8 Cans of beer per week    Comment: every other day   Drug use: No    Comment: THC, last use 08/06/23   Sexual activity: Not on file

## 2023-09-17 ENCOUNTER — Other Ambulatory Visit: Payer: Self-pay | Admitting: Family

## 2023-09-17 DIAGNOSIS — I10 Essential (primary) hypertension: Secondary | ICD-10-CM

## 2023-10-15 ENCOUNTER — Other Ambulatory Visit: Payer: Self-pay | Admitting: Neurology

## 2023-10-15 DIAGNOSIS — R259 Unspecified abnormal involuntary movements: Secondary | ICD-10-CM

## 2023-10-15 DIAGNOSIS — G629 Polyneuropathy, unspecified: Secondary | ICD-10-CM

## 2023-10-15 DIAGNOSIS — F1020 Alcohol dependence, uncomplicated: Secondary | ICD-10-CM

## 2023-10-15 DIAGNOSIS — E538 Deficiency of other specified B group vitamins: Secondary | ICD-10-CM

## 2023-10-15 NOTE — Telephone Encounter (Signed)
 Dr.Athar you are work in provider Last seen on 06/11/23 Follow up scheduled on 01/13/24   Dispensed Days Supply Quantity Provider Pharmacy  pregabalin  200 mg capsule 09/27/2023 30 90 each Sater, Charlie LABOR, MD [facility] [place], ...     Rx pending

## 2023-10-15 NOTE — Telephone Encounter (Signed)
 Lyrica  Rx renewed.

## 2023-10-20 ENCOUNTER — Other Ambulatory Visit: Payer: Self-pay | Admitting: Neurology

## 2023-10-21 ENCOUNTER — Telehealth: Payer: Self-pay | Admitting: Neurology

## 2023-10-21 MED ORDER — PREGABALIN 200 MG PO CAPS: 200.0000 mg | ORAL_CAPSULE | Freq: Every day | ORAL | 0 refills | Status: DC

## 2023-10-21 MED ORDER — PREGABALIN 150 MG PO CAPS: 150.0000 mg | ORAL_CAPSULE | Freq: Two times a day (BID) | ORAL | 0 refills | Status: DC

## 2023-10-21 NOTE — Telephone Encounter (Signed)
 Okay to take 150 mg of pregabalin  twice daily and 200 mg once daily for a total dose of 500 mg daily until Dr. Duncan return.   Please clarify with patient that he will only take 200 mg once daily and the new prescription will be for the 150 mg twice daily.

## 2023-10-21 NOTE — Telephone Encounter (Signed)
 He had 150mg  capsules on hand from previous rx but has none left now

## 2023-10-21 NOTE — Telephone Encounter (Signed)
 Pt called to request for Nurse to give Pt call back . PT would like to have a conversation with MD  about lowering the dosage os his medication (pregabalin  (LYRICA ) 200 MG capsule ) Pt request a call back asap.

## 2023-10-21 NOTE — Telephone Encounter (Signed)
 I had debated sending a new prescription for the 200 mg strength pregabalin  but since he already had a 200 mg prescription I just did not know if we needed to rewrite it.  Updated Rx signed.

## 2023-10-21 NOTE — Telephone Encounter (Addendum)
 Dr. Buck- I had already spoken with the pharmacy before sending you the message and cx rx Lyrica  200mg  on file. Can you resend 200mg  since you want pt to take it with 150mg ? Rx ready below for you to e-scribe. Thank you

## 2023-10-21 NOTE — Telephone Encounter (Signed)
 Dr. Sater- for review once back in office.  Called pt. Relayed that two prescription strengths called in for Lyrica  (150, 200) and explained directions. He verbalized understanding and appreciation. He is aware Dr. Vear will review once back to determine long term plan for management of this medication.   Pt would like to go on lowest dose possible over time, even consider d/c'ing Lyrica  and changing to gabapentin possibly. He will discuss further with Dr. Vear.

## 2023-10-21 NOTE — Telephone Encounter (Signed)
 Called pt at [phone].  Current Lyrica  rx written for 200mg , 3 caps daily (600mg  total). He previously tried decreasing dose per Dr. Duncan recommendation but ended up in hospital with withdrawal d/t decreasing too quickly. He has now started to decrease again. This past Saturday, went to 500mg  total daily. He is requesting 150mg  capsule be called in instead to continue tapering off. He plans to lower by 50mg  per 10 days until he is to 450mg  total daily. Called [facility] at [phone]. Spoke w/ Debbie/pharmacist. Cx rx Lyrica  200mg  on file.   Aware Dr. Vear out of the office until next week and will send to work in MD, Dr. Buck to see if she is willing to call in updated rx for 150mg .

## 2023-10-21 NOTE — Addendum Note (Signed)
 Addended by: JOSHUA MAURILIO CROME on: 10/21/2023 01:01 PM   Modules accepted: Orders

## 2023-10-21 NOTE — Addendum Note (Signed)
 Addended by: Callen Vancuren on: 10/21/2023 01:05 PM   Modules accepted: Orders

## 2023-10-21 NOTE — Addendum Note (Signed)
 Addended by: Roben Schliep on: 10/21/2023 12:33 PM   Modules accepted: Orders

## 2023-10-21 NOTE — Telephone Encounter (Signed)
 How is he taking 500 mg daily? It doesn't show in his Rx.

## 2023-10-29 ENCOUNTER — Ambulatory Visit: Payer: MEDICAID | Admitting: Family

## 2023-10-29 ENCOUNTER — Encounter: Payer: Self-pay | Admitting: Family

## 2023-10-29 VITALS — BP 98/65 | HR 53 | Temp 97.9°F | Ht 70.0 in | Wt 210.6 lb

## 2023-10-29 DIAGNOSIS — I484 Atypical atrial flutter: Secondary | ICD-10-CM

## 2023-10-29 DIAGNOSIS — E782 Mixed hyperlipidemia: Secondary | ICD-10-CM

## 2023-10-29 DIAGNOSIS — E538 Deficiency of other specified B group vitamins: Secondary | ICD-10-CM | POA: Diagnosis not present

## 2023-10-29 DIAGNOSIS — E663 Overweight: Secondary | ICD-10-CM

## 2023-10-29 DIAGNOSIS — I480 Paroxysmal atrial fibrillation: Secondary | ICD-10-CM

## 2023-10-29 DIAGNOSIS — G629 Polyneuropathy, unspecified: Secondary | ICD-10-CM

## 2023-10-29 DIAGNOSIS — I10 Essential (primary) hypertension: Secondary | ICD-10-CM | POA: Diagnosis not present

## 2023-10-29 DIAGNOSIS — H6123 Impacted cerumen, bilateral: Secondary | ICD-10-CM

## 2023-10-29 DIAGNOSIS — E038 Other specified hypothyroidism: Secondary | ICD-10-CM

## 2023-10-29 DIAGNOSIS — J301 Allergic rhinitis due to pollen: Secondary | ICD-10-CM

## 2023-10-29 DIAGNOSIS — F1011 Alcohol abuse, in remission: Secondary | ICD-10-CM

## 2023-10-29 DIAGNOSIS — F339 Major depressive disorder, recurrent, unspecified: Secondary | ICD-10-CM

## 2023-10-29 DIAGNOSIS — R569 Unspecified convulsions: Secondary | ICD-10-CM

## 2023-10-29 MED ORDER — CETIRIZINE HCL 10 MG PO TABS: 10.0000 mg | ORAL_TABLET | Freq: Every day | ORAL | 1 refills | Status: DC

## 2023-10-29 MED ORDER — DILTIAZEM HCL ER COATED BEADS 240 MG PO CP24: 240.0000 mg | ORAL_CAPSULE | Freq: Every day | ORAL | 1 refills | Status: DC

## 2023-10-29 MED ORDER — OLOPATADINE HCL 0.1 % OP SOLN: 1.0000 [drp] | Freq: Two times a day (BID) | OPHTHALMIC | 12 refills | Status: AC

## 2023-10-29 NOTE — Progress Notes (Signed)
 Subjective:    Patient ID: Earl [name], male    DOB: 1960/03/09, 63 y.o.   MRN: [id number]  Chief Complaint  Patient presents with   Medical Management of Chronic Issues    Eyes burning and watering. Patient wants ears cleaned.   Pt presents to the office today for chronic follow up.   He is followed by GI for alcoholic cirrhosis and malignant neoplasm of colon.  He is followed by Cardiologists for NSVT and atrial flutter.   He is followed by Neurologists for neuropathy who gives him Lyrica . He is trying to tamper this down.    He states he stopped drinking alcohol 12/09/21.   He is currently taking lasix  20 mg  as needed when he gains fluid. Has not taken in over a month.  He's lost 75 lbs in the last 6 months through dieting  and working out daily. States he is walking 3 miles a day and weight lifting.       10/29/2023    1:35 PM 08/10/2023    2:38 PM 07/20/2023    3:41 PM  Last 3 Weights  Weight (lbs) 210 lb 9.6 oz 200 lb 210 lb 12.8 oz  Weight (kg) 95.528 kg 90.719 kg 95.618 kg      Hypertension This is a chronic problem. The current episode started more than 1 year ago. The problem has been resolved since onset. The problem is controlled. Pertinent negatives include no malaise/fatigue or peripheral edema. Risk factors for coronary artery disease include dyslipidemia, obesity, male gender and sedentary lifestyle. The current treatment provides moderate improvement. Identifiable causes of hypertension include a thyroid  problem.  Thyroid  Problem Presents for follow-up visit. Symptoms include dry skin. Patient reports no anxiety or constipation. The symptoms have been stable.  Hyperlipidemia This is a chronic problem. The current episode started more than 1 year ago. The problem is controlled. Recent lipid tests were reviewed and are normal. Exacerbating diseases include obesity. Current antihyperlipidemic treatment includes diet change. The current treatment provides  moderate improvement of lipids. Risk factors for coronary artery disease include dyslipidemia, hypertension, a sedentary lifestyle and male sex.  Depression        This is a chronic problem.  The current episode started more than 1 year ago.   The problem occurs intermittently.  Associated symptoms include no helplessness, no hopelessness and not sad.  Past treatments include nothing.  Past medical history includes thyroid  problem.   Gastroesophageal Reflux He complains of belching and heartburn. This is a chronic problem. The current episode started more than 1 year ago. The problem occurs occasionally. The symptoms are aggravated by certain foods. He has tried a diet change for the symptoms. The treatment provided moderate relief.  Ear Fullness  There is pain in both ears. This is a new problem. The current episode started more than 1 month ago. The problem has been unchanged. There has been no fever.     Review of Systems  Constitutional:  Negative for malaise/fatigue.  Gastrointestinal:  Positive for heartburn. Negative for constipation.  Psychiatric/Behavioral:  The patient is not nervous/anxious.   All other systems reviewed and are negative.  Family History  Problem Relation Age of Onset   Alzheimer's disease Mother    Hyperlipidemia Mother    Gallbladder disease Mother    Heart disease Father    Coronary artery disease Father    Liver disease Father    Thyroid  disease Father    Hyperlipidemia Father  Heart attack Father    Cirrhosis Father    Gallbladder disease Sister    Coronary artery disease Paternal Grandfather    Colon cancer Neg Hx    Esophageal cancer Neg Hx    Rectal cancer Neg Hx    Stomach cancer Neg Hx    Social History   Socioeconomic History   Marital status: Single    Spouse name: Not on file   Number of children: 0   Years of education: Not on file   Highest education level: Not on file  Occupational History   Occupation: retired/disabled   Tobacco Use   Smoking status: Former    Current packs/day: 0.00    Types: Cigarettes    Quit date: 03/03/1979    Years since quitting: 44.6   Smokeless tobacco: Never  Vaping Use   Vaping status: Never Used  Substance and Sexual Activity   Alcohol use: Not Currently    Alcohol/week: 8.0 standard drinks of alcohol    Types: 8 Cans of beer per week    Comment: every other day   Drug use: No    Comment: THC, last use 08/06/23   Sexual activity: Not on file  Other Topics Concern   Not on file  Social History Narrative   Disabled   Lives in [place]   Social Drivers of Health   Financial Resource Strain: Medium Risk (04/25/2023)   Overall Financial Resource Strain (CARDIA)    Difficulty of Paying Living Expenses: Somewhat hard  Food Insecurity: No Food Insecurity (05/06/2023)   Hunger Vital Sign    Worried About Running Out of Food in the Last Year: Never true    Ran Out of Food in the Last Year: Never true  Recent Concern: Food Insecurity - Food Insecurity Present (05/01/2023)   Hunger Vital Sign    Worried About Running Out of Food in the Last Year: Sometimes true    Ran Out of Food in the Last Year: Never true  Transportation Needs: No Transportation Needs (05/06/2023)   PRAPARE - Administrator, Civil Service (Medical): No    Lack of Transportation (Non-Medical): No  Physical Activity: Sufficiently Active (04/25/2023)   Exercise Vital Sign    Days of Exercise per Week: 7 days    Minutes of Exercise per Session: 150+ min  Stress: Stress Concern Present (04/25/2023)   [name] [employer] - Occupational Stress Questionnaire    Feeling of Stress : Very much  Social Connections: Socially Isolated (04/25/2023)   Social Connection and Isolation Panel    Frequency of Communication with Friends and Family: More than three times a week    Frequency of Social Gatherings with Friends and Family: Never    Attends Religious Services: Never    Doctor, general practice or Organizations: No    Attends Engineer, structural: Not on file    Marital Status: Never married       Objective:   Physical Exam Vitals reviewed.  Constitutional:      General: He is not in acute distress.    Appearance: He is well-developed. He is obese.  HENT:     Head: Normocephalic.     Right Ear: There is impacted cerumen.     Left Ear: There is impacted cerumen.  Eyes:     General:        Right eye: No discharge.        Left eye: No discharge.  Pupils: Pupils are equal, round, and reactive to light.  Neck:     Thyroid : No thyromegaly.  Cardiovascular:     Rate and Rhythm: Normal rate and regular rhythm.     Heart sounds: Normal heart sounds. No murmur heard. Pulmonary:     Effort: Pulmonary effort is normal. No respiratory distress.     Breath sounds: Normal breath sounds. No wheezing.  Abdominal:     General: Bowel sounds are normal. There is no distension.     Palpations: Abdomen is soft.     Tenderness: There is no abdominal tenderness.  Musculoskeletal:        General: No tenderness. Normal range of motion.     Cervical back: Normal range of motion and neck supple.  Skin:    General: Skin is warm and dry.     Findings: No erythema or rash.  Neurological:     Mental Status: He is alert and oriented to person, place, and time.     Cranial Nerves: No cranial nerve deficit.     Deep Tendon Reflexes: Reflexes are normal and symmetric.     Comments: twitch of left eye  Psychiatric:        Behavior: Behavior normal.        Thought Content: Thought content normal.        Judgment: Judgment normal.     Bilateral ears washed with warm water  and peroxide. TM WNL  BP 98/65   Pulse (!) 53   Temp 97.9 F (36.6 C) (Temporal)   Ht 5' 10 (1.778 m)   Wt 210 lb 9.6 oz (95.5 kg)   SpO2 97%   BMI 30.22 kg/m      Assessment & Plan:  JASHER BARKAN comes in today with chief complaint of Medical Management of Chronic Issues (Eyes burning and  watering. Patient wants ears cleaned.)   Diagnosis and orders addressed:  1. Essential (primary) hypertension (Primary) - CMP14+EGFR - CBC with Differential/Platelet  2. Neuropathy - CMP14+EGFR - CBC with Differential/Platelet  3. Essential hypertension - diltiazem  (CARDIZEM  CD) 240 MG 24 hr capsule; Take 1 capsule (240 mg total) by mouth daily.  Dispense: 90 capsule; Refill: 1 - CMP14+EGFR - CBC with Differential/Platelet  4. Vitamin B 12 deficiency - CMP14+EGFR - CBC with Differential/Platelet  5. Atypical atrial flutter (HCC) - CMP14+EGFR - CBC with Differential/Platelet  6. Mixed hyperlipidemia - CMP14+EGFR - CBC with Differential/Platelet  7. Overweight (BMI 25.0-29.9) - CMP14+EGFR - CBC with Differential/Platelet  8. Alcohol abuse, in remission  - CMP14+EGFR - CBC with Differential/Platelet  9. Depression, recurrent - CMP14+EGFR - CBC with Differential/Platelet  10. Subclinical hypothyroidism - CMP14+EGFR - CBC with Differential/Platelet  11. Paroxysmal atrial fibrillation (HCC)  - CMP14+EGFR - CBC with Differential/Platelet  12. Seizure (HCC) - CMP14+EGFR - CBC with Differential/Platelet  13. Seasonal allergic rhinitis due to pollen Start pataday and zyrtec  Avoid allergens  - cetirizine (ZYRTEC ALLERGY) 10 MG tablet; Take 1 tablet (10 mg total) by mouth daily.  Dispense: 90 tablet; Refill: 1 - olopatadine (PATADAY) 0.1 % ophthalmic solution; Place 1 drop into both eyes 2 (two) times daily.  Dispense: 5 mL; Refill: 12 - CMP14+EGFR - CBC with Differential/Platelet  13. Bilateral impacted cerumen - TM WNL   Labs pending Cardizem  decreased to 240 mg from 300 mg.  Will start pataday and Zyrtec 10 mg  Keep follow up with specialists  Health Maintenance reviewed Diet and exercise encouraged  Follow up plan: 2 weeks  Bari Learn, FNP

## 2023-10-29 NOTE — Patient Instructions (Signed)
Allergic Conjunctivitis, Adult Allergic conjunctivitis is inflammation of the conjunctiva. The conjunctiva is the thin, clear membrane that covers the white part of the eye and the inner surface of the eyelid. Allergies can affect this layer of the eye. In this condition: The blood vessels in the conjunctiva swell and become irritated. The eyes become red or pink and feel itchy. There is often a watery discharge from the eyes. Allergic conjunctivitis is not contagious. This means it cannot be spread from person to person. The condition can develop at any age and may be outgrown. What are the causes? This condition is caused by allergens. These are things that can cause an allergic reaction in some people. Common allergens include: Outdoor allergens, such as: Pollen, including pollen from grass and weeds. Mold spores. Car fumes. Pollution. Indoor allergens, such as: Dust. Smoke. Mold spores. Proteins in a pet's urine, saliva, or dander. Protein buildup on contact lenses. What increases the risk? You may be more likely to develop this condition if you have a family history of these things: Allergies. Conditions caused by being exposed to allergens, such as: Allergic rhinitis. This is an allergic reaction that affects the nose. Bronchial asthma. This condition affects the large airways in the lungs and makes breathing difficult. Atopic dermatitis (eczema). This is inflammation of the skin that is long-term (chronic). What are the signs or symptoms? Symptoms of this condition include eyes that are itchy, red, watery, or puffy. Your eyes may also: Sting or burn. Have clear fluid draining from them. Have thick mucus discharge and pain (vernal conjunctivitis). This happens in severe cases. How is this diagnosed? This condition may be diagnosed based on: Your medical history. A physical exam, including an eye exam. Tests of the fluid draining from your eyes to rule out other causes. Other  tests to confirm the diagnosis, including: Testing for allergies. The skin may be pricked with a tiny needle. The pricked area is then exposed to small amounts of allergens. Testing for other eye conditions. Tests may include: Blood tests. Tissue scrapings from your eyelid. The tissue is then checked under a microscope. How is this treated? Treatment for this condition may include: Using cold, wet cloths (cold compresses) to soothe itching and swelling. Washing your face and hair. Also, washing your clothes often to remove allergens. Using eye drops. These may be prescription or over-the-counter. You may need to try different types to see which one works best for you. Examples include: Eye drops that wash allergens out of the eyes (preservative-free artificial tears). Eye drops that block the allergic reaction (antihistamine). Eye drops that reduce swelling and irritation (anti-inflammatory). Steroid eye drops, which may be given if other treatments have not worked. Oral antihistamine medicines. These are medicines taken by mouth to lessen your allergic reaction. You may need these if eye drops do not help or are difficult to use. An air purifier at home and work. Wraparound sunglasses. This may help to decrease the amount of allergens reaching the eye. Not wearing contact lenses until symptoms improve, if the condition was caused by contact lenses. Change to daily wear disposable contact lenses, if possible. Follow these instructions at home: Eye care Apply a clean, cold compress to your eyes for 10-20 minutes, 3-4 times a day. Do not touch or rub your eyes. Do not wear contact lenses until the inflammation is gone. Wear glasses instead. Do not wear eye makeup until the inflammation is gone. General instructions Avoid known allergens whenever possible. Take or apply  over-the-counter and prescription medicines only as told by your health care provider. These include any eye drops. Drink  enough fluid to keep your urine pale yellow. Keep all follow-up visits. Contact a health care provider if: Your symptoms get worse or do not get better with treatment. You have mild eye pain. You become sensitive to light. You have spots or blisters on your eyes. You have a fever. Get help right away if: You have redness, swelling, or other symptoms in only one eye. Your vision is blurred or you have other vision changes. You have pus draining from your eyes. You have severe eye pain. Summary Allergic conjunctivitis is inflammation of the eye that is caused by allergens. It affects the clear membrane that covers the white part of the eye and the inner surface of the eyelid. It often causes eye itching, redness, and a watery discharge. Take or apply over-the-counter and prescription medicines only as told by your health care provider. These include eye drops. Do not touch or rub your eyes. Contact a health care provider if your symptoms get worse or do not get better with treatment. This information is not intended to replace advice given to you by your health care provider. Make sure you discuss any questions you have with your health care provider. Document Revised: 03/25/2021 Document Reviewed: 03/25/2021 Elsevier Patient Education  2024 [employer].

## 2023-10-30 LAB — CMP14+EGFR
ALT: 15 IU/L (ref 0–44)
AST: 17 IU/L (ref 0–40)
Albumin: 4 g/dL (ref 3.9–4.9)
Alkaline Phosphatase: 90 IU/L (ref 47–123)
BUN/Creatinine Ratio: 22 (ref 10–24)
BUN: 22 mg/dL (ref 8–27)
Bilirubin Total: 0.6 mg/dL (ref 0.0–1.2)
CO2: 25 mmol/L (ref 20–29)
Calcium: 8.8 mg/dL (ref 8.6–10.2)
Chloride: 105 mmol/L (ref 96–106)
Creatinine, Ser: 1 mg/dL (ref 0.76–1.27)
Globulin, Total: 2.4 g/dL (ref 1.5–4.5)
Glucose: 63 mg/dL — AB (ref 70–99)
Potassium: 5.1 mmol/L (ref 3.5–5.2)
Sodium: 142 mmol/L (ref 134–144)
Total Protein: 6.4 g/dL (ref 6.0–8.5)
eGFR: 84 mL/min/1.73 (ref >59)

## 2023-10-30 LAB — CBC WITH DIFFERENTIAL/PLATELET
Basophils Absolute: 0 x10E3/uL (ref 0.0–0.2)
Basos: 0 %
EOS (ABSOLUTE): 0.2 x10E3/uL (ref 0.0–0.4)
Eos: 3 %
Hematocrit: 46.2 % (ref 37.5–51.0)
Hemoglobin: 15.2 g/dL (ref 13.0–17.7)
Immature Grans (Abs): 0 x10E3/uL (ref 0.0–0.1)
Immature Granulocytes: 0 %
Lymphocytes Absolute: 1.6 x10E3/uL (ref 0.7–3.1)
Lymphs: 31 %
MCH: 31.1 pg (ref 26.6–33.0)
MCHC: 32.9 g/dL (ref 31.5–35.7)
MCV: 95 fL (ref 79–97)
Monocytes Absolute: 0.4 x10E3/uL (ref 0.1–0.9)
Monocytes: 7 %
Neutrophils Absolute: 3.1 x10E3/uL (ref 1.4–7.0)
Neutrophils: 59 %
Platelets: 119 x10E3/uL — ABNORMAL LOW (ref 150–450)
RBC: 4.88 x10E6/uL (ref 4.14–5.80)
RDW: 11.9 % (ref 11.6–15.4)
WBC: 5.4 x10E3/uL (ref 3.4–10.8)

## 2023-11-02 ENCOUNTER — Telehealth: Payer: Self-pay | Admitting: Neurology

## 2023-11-02 ENCOUNTER — Other Ambulatory Visit: Payer: Self-pay | Admitting: Neurology

## 2023-11-02 ENCOUNTER — Telehealth: Payer: Self-pay | Admitting: Gastroenterology

## 2023-11-02 ENCOUNTER — Ambulatory Visit: Payer: Self-pay | Admitting: Family

## 2023-11-02 MED ORDER — PREGABALIN 150 MG PO CAPS: ORAL_CAPSULE | ORAL | 0 refills | Status: DC

## 2023-11-02 NOTE — Telephone Encounter (Signed)
 Pt called to informed MD that Pt is doing well on the 450 mg pregabalin  (LYRICA ) 150 MG capsule  and would like to keep the mediation like this .  Pt also states that He will like a call from Nurse to discuss Medication as well

## 2023-11-02 NOTE — Telephone Encounter (Signed)
 PT is requesting to speak with a nurse regarding a new medication. He would also like to discuss why he has to have OV to get EGD. Please advise.

## 2023-11-02 NOTE — Telephone Encounter (Signed)
 Dr. Vear- do you want to send in updated rx now for 150mg  po TID or wait? He last refilled 150mg  10/21/23 #60  Called pt at [phone]. He is taking 150mg  three times daily. He is tolerating this well but would like to stay on this dose for awhile. Has less than a month supply of 150mg . Not taking 200mg .

## 2023-11-02 NOTE — Telephone Encounter (Signed)
 Pt stated that he is unsure if he needs to have another EGD, Pt stated that he has several questions about some of his medications and are they safe for him to take.  ( Cefirizine) Pt was scheduled for an office visit on 11/02/2023 at 1:50 PM. Pt made aware.  Pt verbalized understanding with all questions answered.

## 2023-11-11 ENCOUNTER — Encounter (INDEPENDENT_AMBULATORY_CARE_PROVIDER_SITE_OTHER): Payer: Self-pay | Admitting: Gastroenterology

## 2023-11-17 ENCOUNTER — Encounter: Payer: Self-pay | Admitting: Family

## 2023-11-17 ENCOUNTER — Ambulatory Visit (INDEPENDENT_AMBULATORY_CARE_PROVIDER_SITE_OTHER): Payer: MEDICAID | Admitting: Family

## 2023-11-17 VITALS — BP 101/61 | HR 53 | Temp 97.8°F | Ht 70.0 in | Wt 209.0 lb

## 2023-11-17 DIAGNOSIS — L989 Disorder of the skin and subcutaneous tissue, unspecified: Secondary | ICD-10-CM | POA: Diagnosis not present

## 2023-11-17 DIAGNOSIS — I10 Essential (primary) hypertension: Secondary | ICD-10-CM | POA: Diagnosis not present

## 2023-11-17 DIAGNOSIS — L299 Pruritus, unspecified: Secondary | ICD-10-CM

## 2023-11-17 MED ORDER — DILTIAZEM HCL ER COATED BEADS 180 MG PO CP24: 180.0000 mg | ORAL_CAPSULE | Freq: Every day | ORAL | 1 refills | Status: AC

## 2023-11-17 NOTE — Progress Notes (Signed)
 Subjective:    Patient ID: Earl [name], male    DOB: 1960-12-30, 63 y.o.   MRN: [id number]  Chief Complaint  Patient presents with   Follow-up   PT presents to the office today to recheck hypotension. He was seen on 10/29/23 and we decreased his Cardizem  to 240 mg from 300 mg. His BP is improved, but still on the lower side. Denies any lightheadedness, new SOB, or chest pain.   Reports right ear itching that comes and goes. He has used lidocaine  with moderate relief. Denies any pain or fevers.   She reports a skin lesion on right shoulder that he noticed over a year that is unchanged, but has not resolved.  Hypertension This is a chronic problem. The current episode started more than 1 year ago. The problem has been resolved since onset. The problem is controlled. Pertinent negatives include no malaise/fatigue, peripheral edema or shortness of breath. Risk factors for coronary artery disease include male gender, obesity, dyslipidemia and family history. Past treatments include calcium  channel blockers. The current treatment provides moderate improvement.      Review of Systems  Constitutional:  Negative for malaise/fatigue.  Respiratory:  Negative for shortness of breath.   All other systems reviewed and are negative.   Social History   Socioeconomic History   Marital status: Single    Spouse name: Not on file   Number of children: 0   Years of education: Not on file   Highest education level: Not on file  Occupational History   Occupation: retired/disabled  Tobacco Use   Smoking status: Former    Current packs/day: 0.00    Types: Cigarettes    Quit date: 03/03/1979    Years since quitting: 44.7   Smokeless tobacco: Never  Vaping Use   Vaping status: Never Used  Substance and Sexual Activity   Alcohol use: Not Currently    Alcohol/week: 8.0 standard drinks of alcohol    Types: 8 Cans of beer per week    Comment: every other day   Drug use: No    Comment: THC,  last use 08/06/23   Sexual activity: Not on file  Other Topics Concern   Not on file  Social History Narrative   Disabled   Lives in [place]   Social Drivers of Health   Financial Resource Strain: Medium Risk (04/25/2023)   Overall Financial Resource Strain (CARDIA)    Difficulty of Paying Living Expenses: Somewhat hard  Food Insecurity: No Food Insecurity (05/06/2023)   Hunger Vital Sign    Worried About Running Out of Food in the Last Year: Never true    Ran Out of Food in the Last Year: Never true  Recent Concern: Food Insecurity - Food Insecurity Present (05/01/2023)   Hunger Vital Sign    Worried About Running Out of Food in the Last Year: Sometimes true    Ran Out of Food in the Last Year: Never true  Transportation Needs: No Transportation Needs (05/06/2023)   PRAPARE - Administrator, Civil Service (Medical): No    Lack of Transportation (Non-Medical): No  Physical Activity: Sufficiently Active (04/25/2023)   Exercise Vital Sign    Days of Exercise per Week: 7 days    Minutes of Exercise per Session: 150+ min  Stress: Stress Concern Present (04/25/2023)   [employer] of Occupational Health - Occupational Stress Questionnaire    Feeling of Stress : Very much  Social Connections: Socially Isolated (04/25/2023)   Social  Connection and Isolation Panel    Frequency of Communication with Friends and Family: More than three times a week    Frequency of Social Gatherings with Friends and Family: Never    Attends Religious Services: Never    Database administrator or Organizations: No    Attends Engineer, structural: Not on file    Marital Status: Never married   Family History  Problem Relation Age of Onset   Alzheimer's disease Mother    Hyperlipidemia Mother    Gallbladder disease Mother    Heart disease Father    Coronary artery disease Father    Liver disease Father    Thyroid  disease Father    Hyperlipidemia Father    Heart attack Father     Cirrhosis Father    Gallbladder disease Sister    Coronary artery disease Paternal Grandfather    Colon cancer Neg Hx    Esophageal cancer Neg Hx    Rectal cancer Neg Hx    Stomach cancer Neg Hx         Objective:   Physical Exam Vitals reviewed.  Constitutional:      General: He is not in acute distress.    Appearance: He is well-developed.  HENT:     Head: Normocephalic.     Right Ear: Tympanic membrane normal.     Left Ear: Tympanic membrane normal.  Eyes:     General:        Right eye: No discharge.        Left eye: No discharge.     Pupils: Pupils are equal, round, and reactive to light.  Neck:     Thyroid : No thyromegaly.  Cardiovascular:     Rate and Rhythm: Normal rate and regular rhythm.     Heart sounds: Normal heart sounds. No murmur heard. Pulmonary:     Effort: Pulmonary effort is normal. No respiratory distress.     Breath sounds: Normal breath sounds. No wheezing.  Abdominal:     General: Bowel sounds are normal. There is no distension.     Palpations: Abdomen is soft.     Tenderness: There is no abdominal tenderness.  Musculoskeletal:        General: No tenderness. Normal range of motion.     Cervical back: Normal range of motion and neck supple.  Skin:    General: Skin is warm and dry.     Findings: No erythema or rash.         Comments: Skin color papule on right shoulder, circular   Neurological:     Mental Status: He is alert and oriented to person, place, and time.     Cranial Nerves: No cranial nerve deficit.     Deep Tendon Reflexes: Reflexes are normal and symmetric.  Psychiatric:        Behavior: Behavior normal.        Thought Content: Thought content normal.        Judgment: Judgment normal.       BP 101/61   Pulse (!) 53   Temp 97.8 F (36.6 C) (Temporal)   Ht 5' 10 (1.778 m)   Wt 209 lb (94.8 kg)   SpO2 99%   BMI 29.99 kg/m      Assessment & Plan:  Earl [name] comes in today with chief complaint of  Follow-up   Diagnosis and orders addressed:  1. Essential hypertension (Primary) Will decrease Cardizem  to 180 mg from 240 mg  Force  fluids Report any lightheadedness, dizziness Labs review  Follow up in 2 months  - diltiazem  (CARDIZEM  CD) 180 MG 24 hr capsule; Take 1 capsule (180 mg total) by mouth daily.  Dispense: 90 capsule; Refill: 1   2. Ear itching Wash ear after shampoo. Can use antifungal cream in ear  3. Skin lesion Looks benign. Continue to monitor. Report any changes in size or color    Bari Learn, FNP

## 2023-11-17 NOTE — Patient Instructions (Signed)
 Hypotension As the heart beats, it forces blood through the body. Hypotension, commonly called low blood pressure, is when the force of blood pumping through the arteries is too weak. Arteries are blood vessels that carry blood from the heart throughout the body. Depending on the cause and severity, hypotension may be harmless (benign) or may cause serious problems (be critical). When your blood pressure is too low, you may not get enough blood to your brain or to the rest of your organs. This can cause weakness, light-headedness, a rapid heartbeat, and fainting. What are the causes? This condition may be caused by: Blood loss. Loss of body fluids (dehydration). Heart problems. Hormone (endocrine) problems. Pregnancy. Severe infection. Lack of certain nutrients. Severe allergic reactions (anaphylaxis). Certain medicines, such as blood pressure medicine or medicines that make the body lose excess fluids (diuretics). Sometimes, hypotension may be caused by not taking medicine as directed, such as taking too much of a certain medicine. What increases the risk? The following factors may make you more likely to develop this condition: Age. Risk increases as you get older. Having a condition that affects the heart or the central nervous system. What are the signs or symptoms? Common symptoms of this condition include: Weakness. Light-headedness. Dizziness. Blurred vision. Tiredness (fatigue). Rapid heartbeat. Fainting, in severe cases. How is this diagnosed? This condition is diagnosed based on: Your medical history. Your symptoms. Your blood pressure measurement. Your health care provider will check your blood pressure when you are: Lying down. Sitting. Standing. A blood pressure reading is recorded as two numbers, such as "120 over 80" (or 120/80). The first ("top") number is called the systolic pressure. It is a measure of the pressure in your arteries as your heart beats. The second  ("bottom") number is called the diastolic pressure. It is a measure of the pressure in your arteries when your heart relaxes between beats. Blood pressure is measured in a unit called mm Hg. Healthy blood pressure for most adults is 120/80. If your blood pressure is below 90/60, you may be diagnosed with hypotension. Other information or tests that may be used to diagnose hypotension include: Your other vital signs, such as your heart rate and temperature. Blood tests. Tilt table test. For this test, you will be safely secured to a table that moves you from a lying position to an upright position. Your heart rhythm and blood pressure will be monitored during the test. How is this treated? Treatment for this condition may include: Changing your diet. This may involve drinking more water or increasing your salt (sodium) intake with high-sodium foods. Taking medicines to raise your blood pressure. Changing the dosage of certain medicines you are taking that might be lowering your blood pressure. Wearing compression stockings. These stockings help to prevent blood clots and reduce swelling in your legs. In some cases, you may need to go to the hospital for: Fluid replacement. This means you will receive fluids through an IV. Blood replacement. This means you will receive donated blood through an IV (transfusion). Treating an infection or heart problems, if this applies. Monitoring. You may need to be monitored while medicines that you are taking wear off. Follow these instructions at home: Eating and drinking  Drink enough fluid to keep your urine pale yellow. Eat a healthy diet, and follow instructions from your health care provider about eating or drinking restrictions. A healthy diet includes: Fresh fruits and vegetables. Whole grains. Lean meats. Low-fat dairy products. Increase your salt intake if told  to do so. Do not add extra salt to your diet unless your health care provider tells you  to do that. Eat frequent, small meals. Avoid standing up suddenly after eating. Medicines Take over-the-counter and prescription medicines only as told by your health care provider. Follow instructions from your health care provider about changing the dosage of your current medicines, if this applies. Do not stop or adjust any of your medicines on your own. General instructions  Wear compression stockings as told by your health care provider. Get up slowly from lying down or sitting positions. This gives your blood pressure a chance to adjust. Avoid hot showers and excessive heat as directed by your health care provider. Return to your normal activities as told by your health care provider. Ask your health care provider what activities are safe for you. Do not use any products that contain nicotine or tobacco. These products include cigarettes, chewing tobacco, and vaping devices, such as e-cigarettes. If you need help quitting, ask your health care provider. Keep all follow-up visits. This is important. Contact a health care provider if: You vomit. You have diarrhea. You have a fever for more than 2-3 days. You feel more thirsty than usual. You feel weak and tired. Get help right away if: You have chest pain. You have a fast or irregular heartbeat. You develop numbness in any part of your body. You cannot move your arms or your legs. You have trouble speaking. You become sweaty or feel light-headed. You faint. You feel short of breath. You have trouble staying awake. You feel confused. These symptoms may be an emergency. Get help right away. Call 911. Do not wait to see if the symptoms will go away. Do not drive yourself to the hospital. Summary Hypotension is when the force of blood pumping through the arteries is too weak. Hypotension may be harmless (benign) or may cause serious problems (be critical). Treatment for this condition may include changing your diet, changing  your medicines, and wearing compression stockings. In some cases, you may need to go to the hospital for fluid or blood replacement. This information is not intended to replace advice given to you by your health care provider. Make sure you discuss any questions you have with your health care provider. Document Revised: 09/03/2020 Document Reviewed: 09/03/2020 Elsevier Patient Education  2024 [employer].

## 2023-11-30 ENCOUNTER — Encounter: Payer: Self-pay | Admitting: Radiology

## 2023-11-30 ENCOUNTER — Ambulatory Visit (INDEPENDENT_AMBULATORY_CARE_PROVIDER_SITE_OTHER): Payer: MEDICAID | Admitting: Gastroenterology

## 2023-11-30 ENCOUNTER — Encounter: Payer: Self-pay | Admitting: Gastroenterology

## 2023-11-30 VITALS — BP 90/50 | HR 55 | Ht 70.0 in | Wt 211.0 lb

## 2023-11-30 DIAGNOSIS — K7031 Alcoholic cirrhosis of liver with ascites: Secondary | ICD-10-CM

## 2023-11-30 DIAGNOSIS — Z860100 Personal history of colon polyps, unspecified: Secondary | ICD-10-CM | POA: Diagnosis not present

## 2023-11-30 NOTE — Patient Instructions (Signed)
 _______________________________________________________  If your blood pressure at your visit was 140/90 or greater, please contact your primary care physician to follow up on this.  _______________________________________________________  If you are age 64 or older, your body mass index should be between 23-30. Your Body mass index is 30.28 kg/m. If this is out of the aforementioned range listed, please consider follow up with your Primary Care Provider.  If you are age 99 or younger, your body mass index should be between 19-25. Your Body mass index is 30.28 kg/m. If this is out of the aformentioned range listed, please consider follow up with your Primary Care Provider.   ________________________________________________________  The [facility] providers would like to encourage you to use MYCHART to communicate with providers for non-urgent requests or questions.  Due to long hold times on the telephone, sending your provider a message by [facility] may be a faster and more efficient way to get a response.  Please allow 48 business hours for a response.  Please remember that this is for non-urgent requests.  _______________________________________________________  [facility] is using a team-based approach to care.  Your team is made up of your doctor and two to three APPS. Our APPS (Nurse Practitioners and Physician Assistants) work with your physician to ensure care continuity for you. They are fully qualified to address your health concerns and develop a treatment plan. They communicate directly with your gastroenterologist to care for you. Seeing the Advanced Practice Practitioners on your physician's team can help you by facilitating care more promptly, often allowing for earlier appointments, access to diagnostic testing, procedures, and other specialty referrals.   Recall ultrasound for 05-2024. Please call is in April 2026 to schedule for May 2026  Repeat EGD/colonoscopy for  06-2025. Please call 2 months prior to schedule this. A letter will be sent as it gets closer.  Continue current medications Call us  with any questions or concerns.  It was a pleasure to see you today!  Thank you for trusting me with your gastrointestinal care!

## 2023-11-30 NOTE — Progress Notes (Signed)
 06/05/2023 Earl [name] [id number] 1960-07-23   Referring provider: Lavell Bari LABOR, FNP Primary GI doctor: Dr. Charlanne   ASSESSMENT AND PLAN:    Cirrhosis 05/08/2023 WBC 5.4 HGB 14.5 Platelets 89 05/01/2023 AST 27 ALT 27 Alkphos 94 TBili 1.3 04/11/2022 INR 1.1 Will get INR to calculate  Serologic workup: 2019, 2020 negative ANA, hepatitis panel, ferritin, will get complete work up but likely from ETOH which patient has stopped Ascites:      Last LVP 06/2019 no SB -Nutrition and low sodium diet discussed with patient and information given - appears euvolemic - he is not on medications Varices screening / surveillance EGD:     Last EGD 05/08/2022  A small scar was found in the lower third of the esophagus from previous EVL. Grade 0- I varices were found in the lower third of the esophagus. Mild portal hypertensive gastropathy was found in the entire examined stomach. Biopsies were taken with a cold forceps for histology. No fundal varices. Incidental note was made of 2- 3 small 4 to 6 mm gastric polyps ( left alone, previously deemed to be hyperplastic) No history of varices. NOT prophylaxis, will schedule for repeat EGD Hepatic encephalopathy:  Pt does not report any symptoms consistent with HE and no asterixis on exam.  Most recent HCC screening:    12/26/2022 MR liver with and without contrast Last AFP remote Provided general information to the patient: -Continue daily multivitamin -Recommended 30 minutes of aerobic and resistance exercise 3 days/week, he is walking, lifting  -Encouraged pt to increase protein intake - [facility]   Afib s/p ablation Not on blood thinner, on cardiazem   Personal history of colon cancer No hematochezia, no constipation, diarrhea He underwent a screening colonoscopy in 2018 which identified 1 polyp removed from the sigmoid colon, path report identified invasive adenocarcinoma within a tubular adenomatous polyp.   CTAP was unremarkable and CEA level was normal.  Her last colonoscopy was 07/25/2020 and it showed 2 show 2 sessile polyps 3 to 5 mm in size external/internal hemorrhoids  Recall colonoscopy 06/2025     Patient Care Team: Lavell Bari LABOR, FNP as PCP - General (Family Medicine) Mealor, Eulas BRAVO, MD as PCP - Electrophysiology (Cardiology)   HISTORY OF PRESENT ILLNESS: 63 y.o. 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 Has history of the following complications from cirrhosis: esophageal varices, portal hypertensive gastropathy, ascites, thrombocytopenia, and hepatic encephalopathy   Last EGD was 2022 recall 2025.  Last HCC screen: 11/2022 MRI normal Last AFP remote  Last INR: 04/11/2022 1.1       Wt Readings from Last 3 Encounters:  06/05/23 200 lb (90.7 kg)  04/28/23 195 lb (88.5 kg)  12/29/22 222 lb 8 oz (100.9 kg)    Discussed the use of AI scribe software for clinical note transcription with the patient, who gave verbal consent to proceed.   History of Present Illness   Earl [name] is a 63 year old male with cirrhosis and a history of varices who presents for follow-up and lab work.   He has a history of cirrhosis with varices that were previously banded. He has not experienced recent issues with varices, melena, or hematochezia. His urine appears normal. He was scheduled to visit the transplant center at [facility] in [place] on June 17th but was hospitalized due to a medication adjustment of Lyrica  from 600 mg to 400 mg, resulting in a seven to  eight-day hospital stay.   He reports no abdominal swelling and feels significantly improved compared to a year ago, having lost nine pounds. He engages in regular physical activity, including lifting weights and walking two miles a day, despite experiencing neuropathy. He follows a low sodium diet and has discontinued Lasix  as he feels it is no longer necessary. He abstains from alcohol and is focused on  maintaining his liver health.   He expresses concern about the potential impact of the Moderna COVID-19 vaccine on his liver, noting that his liver failure occurred shortly after receiving the shot. He has no symptoms of hepatic encephalopathy and is not currently taking any medications to manage fluid retention.   He has a history of pernicious anemia and takes B12 shots, although he did not bring his B12 medication to the appointment. He also takes a prescription-strength vitamin D1 once a week. His neuropathy medication causes a sensation of being 'buzzed', necessitating caution to avoid falls.   He has a history of invasive adenocarcinoma found in a colonoscopy in 2018, but his most recent colonoscopy in 2022 showed only sessile polyps and hemorrhoids, with no current symptoms from the hemorrhoids.   He experiences no shortness of breath, chest pain, or significant changes in bowel habits, although he has a lifelong history of constipation that has improved with dietary changes. No diarrhea. 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 He has undergone a heart ablation procedure in the past.     Social history:  He  reports that he quit smoking about 44 years ago. His smoking use included cigarettes. He has never used smokeless tobacco. He reports that he does not currently use alcohol after a past usage of about 8.0 standard drinks of alcohol per week. He reports that he does not use drugs.   RELEVANT GI HISTORY, LABS, IMAGING:   CBC Labs (Brief)              Component Value Date/Time    WBC 5.4 05/08/2023 0732    RBC 4.61 05/08/2023 0732    HGB 14.5 05/08/2023 0732    HGB 15.0 04/28/2023 1449    HCT 41.3 05/08/2023 0732    HCT 44.0 04/28/2023 1449    PLT 89 (L) 05/08/2023 0732    PLT 136 (L) 04/28/2023 1449    MCV 89.6 05/08/2023 0732    MCV 92 04/28/2023 1449    MCH 31.5 05/08/2023 0732    MCHC  35.1 05/08/2023 0732    RDW 13.2 05/08/2023 0732    RDW 12.3 04/28/2023 1449    LYMPHSABS 1.6 05/08/2023 0732    LYMPHSABS 1.5 04/28/2023 1449    MONOABS 0.6 05/08/2023 0732    EOSABS 0.1 05/08/2023 0732    EOSABS 0.1 04/28/2023 1449    BASOSABS 0.0 05/08/2023 0732    BASOSABS 0.0 04/28/2023 1449         Recent Labs (within last 365 days)                    Recent Labs    09/05/22 1217 04/28/23 1449 05/01/23 1540 05/02/23 0900 05/03/23 0730 05/07/23 0633 05/08/23 0732  HGB 14.5 15.0 14.8 14.2 13.8 15.8 14.5        CMP     Labs (Brief)              Component Value Date/Time    NA 140 05/08/2023  0732    NA 141 04/28/2023 1449    K 3.6 05/08/2023 0732    CL 106 05/08/2023 0732    CO2 27 05/08/2023 0732    GLUCOSE 88 05/08/2023 0732    BUN 12 05/08/2023 0732    BUN 13 04/28/2023 1449    CREATININE 0.91 05/08/2023 0732    CREATININE 0.83 07/18/2020 1304    CALCIUM  8.7 (L) 05/08/2023 0732    PROT 7.3 05/01/2023 1540    PROT 6.8 04/28/2023 1449    ALBUMIN 4.2 05/01/2023 1540    ALBUMIN 4.2 04/28/2023 1449    AST 27 05/01/2023 1540    ALT 27 05/01/2023 1540    ALKPHOS 94 05/01/2023 1540    BILITOT 1.3 (H) 05/01/2023 1540    BILITOT 0.8 04/28/2023 1449    GFRNONAA >60 05/08/2023 0732    GFRNONAA 71 09/06/2019 1313    GFRAA 82 09/06/2019 1313          Latest Ref Rng & Units 05/01/2023    3:40 PM 04/28/2023    2:49 PM 09/05/2022   12:17 PM  Hepatic Function  Total Protein 6.5 - 8.1 g/dL 7.3  6.8  7.0   Albumin 3.5 - 5.0 g/dL 4.2  4.2  4.2   AST 15 - 41 U/L 27  31  21    ALT 0 - 44 U/L 27  32  21   Alk Phosphatase 38 - 126 U/L 94  132  109   Total Bilirubin 0.0 - 1.2 mg/dL 1.3  0.8  0.7         Latest Ref Rng & Units 07/11/2022    1:08 PM 10/22/2022    3:17 PM 04/07/2023   10:05 AM  Hepatitis C  AFP <6.1 ng/mL 3.6  3.2  2.5       Current Medications:      Current Outpatient Medications (Cardiovascular):    diltiazem  (CARDIZEM  CD) 240 MG 24 hr capsule,  Take 1 capsule (240 mg total) by mouth daily.       Current Outpatient Medications (Hematological):    cyanocobalamin  (VITAMIN B12) 1000 MCG/ML injection, Inject 1 mL (1,000 mcg total) into the muscle every 30 (thirty) days.   Current Outpatient Medications (Other):    [START ON 06/28/2023] ALPRAZolam  (XANAX ) 1 MG tablet, TAKE 1 TABLET BY MOUTH DAILY AS NEEDED FOR ANXIETY (MUST LAST 30 DAYS)   clotrimazole  (LOTRIMIN ) 1 % cream, Apply topically 2 (two) times daily.   feeding supplement (ENSURE ENLIVE / ENSURE PLUS) LIQD, Take 237 mLs by mouth 3 (three) times daily between meals. (Patient taking differently: Take 237 mLs by mouth daily.)   levETIRAcetam  (KEPPRA ) 750 MG tablet, Take 1 tablet (750 mg total) by mouth 2 (two) times daily.   ondansetron  (ZOFRAN ) 4 MG tablet, TAKE 1 TABLET BY MOUTH TWICE DAILY AS NEEDED FOR NAUSEA OR FOR VOMITING   oxcarbazepine  (TRILEPTAL ) 600 MG tablet, Take 1 tablet (600 mg total) by mouth 2 (two) times daily.   pantoprazole  (PROTONIX ) 40 MG tablet, Take 1 tablet (40 mg total) by mouth daily before breakfast. (Patient taking differently: Take 40 mg by mouth daily as needed (For acid reflux).)   pregabalin  (LYRICA ) 200 MG capsule, Take 1 capsule (200 mg total) by mouth in the morning, at noon, and at bedtime. Take 3 capsules (600 mg) po daily.    Prescription must last 30 or more days. (Patient taking differently: Take 200 mg by mouth in the morning, at noon, and at bedtime.)  thiamine  (VITAMIN B-1) 100 MG tablet, Take 1 tablet (100 mg total) by mouth daily.   Medical History:         Past Medical History:  Diagnosis Date   Alcohol abuse     Anemia     Anxiety     Arthritis     Colon cancer (HCC)     Depression     Dysrhythmia     Gall stones     Heart burn     History of kidney stones     Hyperlipidemia     Hypertension     Hypothyroidism     Liver disease     Pernicious anemia     Tachycardia     Typical atrial flutter (HCC)     Vitamin B12  deficiency           Allergies:  Allergies           Allergies  Allergen Reactions   Amitriptyline Anaphylaxis   Amoxicillin Anaphylaxis and Rash   Asa [Aspirin] Anaphylaxis   Penicillin G Anaphylaxis   Almecillin     Clonidine Derivatives Other (See Comments)      Severe Dry Mouth   Hydrochlorothiazide        Caused rectal bleeding and hematuria   Levothyroxine  Nausea And Vomiting      Patient states that the 75 mcg is too strong for him.   Trintellix  [Vortioxetine ] Nausea And Vomiting        Surgical History:  He  has a past surgical history that includes Minor carpal tunnel; Colonoscopy (N/A, 04/17/2016); polypectomy (04/17/2016); A-FLUTTER ABLATION (N/A, 07/31/2016); Colonoscopy (N/A, 05/07/2017); implantable loop recorder placement (01/24/2019); Colonoscopy with propofol  (N/A, 07/25/2020); Esophagogastroduodenoscopy (egd) with propofol  (N/A, 07/25/2020); polypectomy (07/25/2020); esophageal banding (07/25/2020); Esophagogastroduodenoscopy (egd) with propofol  (N/A, 04/11/2021); Esophagogastroduodenoscopy (egd) with propofol  (N/A, 05/08/2022); and biopsy (05/08/2022). Family History:  His family history includes Alzheimer's disease in his mother; Cirrhosis in his father; Coronary artery disease in his father and paternal grandfather; Gallbladder disease in his mother and sister; Heart attack in his father; Heart disease in his father; Hyperlipidemia in his father and mother; Liver disease in his father; Thyroid  disease in his father.   REVIEW OF SYSTEMS  : All other systems reviewed and negative except where noted in the History of Present Illness.   PHYSICAL EXAM: BP 110/72   Pulse (!) 49   Ht 5' 10 (1.778 m)   Wt 200 lb (90.7 kg)   BMI 28.70 kg/m  Physical Exam   GENERAL APPEARANCE: Well nourished, in no apparent distress HEENT: No cervical lymphadenopathy, unremarkable thyroid , sclerae anicteric, conjunctiva pink RESPIRATORY: Respiratory effort normal, lungs clear to auscultation  bilaterally, BS equal bilateral without rales, rhonchi, wheezing CARDIO: RRR with no MRGs, peripheral pulses intact ABDOMEN: Soft, non distended, active bowel sounds in all 4 quadrants, non-tender, no rebound, no mass appreciated, no ascites RECTAL: declines MUSCULOSKELETAL: Full ROM, normal gait, without edema SKIN: Dry, intact without rashes or lesions. No jaundice. NEURO: Alert, oriented, no focal deficits, normal cerebellar function PSYCH: Cooperative, normal mood and affect. EXTREMITIES: No edema in lower extremities             Alan JONELLE Coombs, PA-C        Attending physician's note    I have taken history, reviewed the chart and examined the patient. I performed a substantive portion of this encounter, including complete performance of at least one of the key components, in conjunction with the APP. I agree  with the Advanced Practitioner's note, impression and recommendations.    Well compensated ETOH Liver cirrhosis. MELD 3.0: 8. No liver transplant as he has very low MELD score. H/O malignant colonic polyp 2018, neg colon 2022 except for polyps H/O EV s/p EVL 06/2020, Neg EGD 04/2022, most recent EGD 07/2023-very minimal varices.  Started on Coreg.  HCC screening. No HE or ascites.   Plan: For EGD/colon 06/2025 Continue coreg 3.125mg  po BID Continue annual US  (US  abd may 2026) for HCC screening Continue current meds     Anselm Bring, MD 11/30/2023, 2:07 PM  Cc: Lavell Bari LABOR, FNP   MELD 3.0: 8 at 06/05/2023  3:50 PM MELD-Na: 9 at 06/05/2023  3:50 PM Calculated from: Serum Creatinine: 0.7 mg/dL (Using min of 1 mg/dL) at 4/0/7974  6:49 PM Serum Sodium: 142 mEq/L (Using max of 137 mEq/L) at 06/05/2023  3:50 PM Total Bilirubin: 0.4 mg/dL (Using min of 1 mg/dL) at 4/0/7974  6:49 PM Serum Albumin: 4 g/dL (Using max of 3.5 g/dL) at 4/0/7974  6:49 PM INR(ratio): 1.3 ratio at 06/05/2023  3:50 PM Age at listing (hypothetical): 63 years Sex: Male at 06/05/2023  3:50 PM

## 2023-12-21 ENCOUNTER — Other Ambulatory Visit: Payer: Self-pay | Admitting: Neurology

## 2023-12-21 ENCOUNTER — Telehealth: Payer: Self-pay

## 2023-12-21 NOTE — Telephone Encounter (Signed)
 Per Dr. Ira 11/3 office note, patient will continue annual US  screenings. Next US  due May 2026.

## 2023-12-21 NOTE — Telephone Encounter (Signed)
-----   Message from Nurse Elspeth RAMAN sent at 06/19/2023  8:44 AM EDT -----  Personal reminder sent on 06/19/2023 Recall AB US  6 months, please place US  ABDOMEN LIMITED RUQ (LIVER/GB)  Alan Coombs PA

## 2023-12-21 NOTE — Telephone Encounter (Signed)
 Requested Prescriptions   Pending Prescriptions Disp Refills   ALPRAZolam  (XANAX ) 1 MG tablet [Pharmacy Med Name: alprazolam  1 mg tablet] 30 tablet 5    Sig: TAKE 1 TABLET BY MOUTH DAILY AS NEEDED   pregabalin  (LYRICA ) 150 MG capsule [Pharmacy Med Name: pregabalin  150 mg capsule] 90 capsule 0    Sig: TAKE 1 CAPSULE BY MOUTH THREE TIMES DAILY   Last seen 06/11/23 Next appt 01/13/24  Dispenses   Dispensed Days Supply Quantity Provider Pharmacy  pregabalin  150 mg capsule 11/17/2023 30 90 each Sater, Charlie LABOR, [place] [facility] [place]  pregabalin  150 mg capsule 10/21/2023 30 60 each Athar, Saima, [place] [facility] [place] ...  pregabalin  200 mg capsule 09/27/2023 30 90 each Sater, Charlie LABOR, MD [facility] [place], ...  pregabalin  200 mg capsule 08/31/2023 30 90 each Sater, Charlie LABOR, MD [facility] [place], ...  pregabalin  200 mg capsule 07/22/2023 30 90 each Sater, Charlie LABOR, [facility] [place], ...  pregabalin  150 mg capsule 06/11/2023 45 90 each Sater, Charlie LABOR, MD [facility] [place], ...  pregabalin  200 mg capsule 05/28/2023 30 90 each Sater, Charlie LABOR, MD [facility] [place], ...  pregabalin  200 mg capsule 04/29/2023 30 90 each Sater, Charlie LABOR, MD [facility] [place], ...  pregabalin  200 mg capsule 03/31/2023 30 90 each Sater, Charlie LABOR, MD [facility] [place], ...  PREGABALIN    CAP 200MG  03/01/2023 30 90 each Sater, Charlie LABOR, MD [facility] [place], ...  pregabalin  200 mg capsule 01/30/2023 30 90 each Sater, Charlie LABOR, MD [facility] [place], ...  pregabalin  200 mg capsule 12/31/2022 30 90 each Sater, Charlie LABOR, MD [facility] [place], ...      Dispenses   Dispensed Days Supply Quantity Provider Pharmacy  pregabalin  150 mg capsule 11/17/2023 30 90 each Sater, Charlie LABOR, MD [facility] [place], ...  pregabalin  150 mg capsule 10/21/2023 30 60 each Athar, Saima, MD [facility] [place], ...  pregabalin  200 mg capsule 09/27/2023 30 90 each Sater, Charlie LABOR, MD  [facility] [place], ...  pregabalin  200 mg capsule 08/31/2023 30 90 each Sater, Charlie LABOR, MD [facility] [place], ...  pregabalin  200 mg capsule 07/22/2023 30 90 each Sater, Charlie LABOR, MD [facility] [place], ...  pregabalin  150 mg capsule 06/11/2023 45 90 each Sater, Charlie LABOR, MD [facility] [place], ...  pregabalin  200 mg capsule 05/28/2023 30 90 each Sater, Charlie LABOR, MD [facility] [place], ...  pregabalin  200 mg capsule 04/29/2023 30 90 each Sater, Charlie LABOR, MD [facility] [place], ...  pregabalin  200 mg capsule 03/31/2023 30 90 each Sater, Charlie LABOR, MD [facility] [place], ...  PREGABALIN    CAP 200MG  03/01/2023 30 90 each Sater, Charlie LABOR, MD [facility] [place], ...  pregabalin  200 mg capsule 01/30/2023 30 90 each Sater, Charlie LABOR, MD [facility] [place], ...  pregabalin  200 mg capsule 12/31/2022 30 90 each Sater, Charlie LABOR, MD [facility] [place], .[place]

## 2023-12-21 NOTE — Telephone Encounter (Signed)
-----   Message from Nurse Nyla C sent at 06/18/2023  9:21 AM EDT ----- Recall 6 month abd US . Refer back to imaging note 06/17/23, order needs to be placed.

## 2023-12-21 NOTE — Telephone Encounter (Signed)
 Chart reviewed and noted recent documentation form office visit that stated  Continue annual US  (US  abd may 2026) for [facility] screening Reminder placed in Epic.

## 2024-01-07 ENCOUNTER — Telehealth: Payer: Self-pay | Admitting: Neurology

## 2024-01-07 NOTE — Telephone Encounter (Signed)
 MYC conf

## 2024-01-13 ENCOUNTER — Ambulatory Visit: Payer: MEDICAID | Admitting: Neurology

## 2024-01-13 ENCOUNTER — Encounter: Payer: Self-pay | Admitting: Neurology

## 2024-01-13 VITALS — BP 117/74 | HR 48 | Ht 70.0 in | Wt 213.5 lb

## 2024-01-13 DIAGNOSIS — G5132 Clonic hemifacial spasm, left: Secondary | ICD-10-CM

## 2024-01-13 DIAGNOSIS — G629 Polyneuropathy, unspecified: Secondary | ICD-10-CM

## 2024-01-13 DIAGNOSIS — G5602 Carpal tunnel syndrome, left upper limb: Secondary | ICD-10-CM | POA: Diagnosis not present

## 2024-01-13 DIAGNOSIS — K7460 Unspecified cirrhosis of liver: Secondary | ICD-10-CM

## 2024-01-13 DIAGNOSIS — R569 Unspecified convulsions: Secondary | ICD-10-CM | POA: Diagnosis not present

## 2024-01-13 MED ORDER — PREGABALIN 150 MG PO CAPS: 150.0000 mg | ORAL_CAPSULE | Freq: Three times a day (TID) | ORAL | 5 refills | Status: AC

## 2024-01-13 MED ORDER — ALPRAZOLAM 1 MG PO TABS: 1.0000 mg | ORAL_TABLET | Freq: Every day | ORAL | 5 refills | Status: AC | PRN

## 2024-01-13 NOTE — Progress Notes (Signed)
 [facility]  PATIENT: Earl [name] DOB: 07/10/1960  REFERRING DOCTOR OR PCP:  Dr. Jerrell SOURCE: Patient, notes from primary care, imaging reports, MRI images personally reviewed on PACS  _________________________________   HISTORICAL  CHIEF COMPLAINT:  Chief Complaint  Patient presents with   RM10/SEIZURES    Pt is here Alone. Pt states he has been doing pretty good since last appointment.     HISTORY OF PRESENT ILLNESS:  Earl [name] is a 63 y.o. man with numbness in his legs and left facial twitching.      Update 01/13/2024 He denies seizure since the last appt.   He is off oxcarbazepine  and Keppra  and feels much better off the medications.  He is still on Lyrica  150 mg po bid.     He is walking 3-4 miles a day for exercise and feels this is better than earlier in the year.     HFS are generally doing better than earlier this year.     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  He is not a good surgical candidate as he has multiple comorbidities including cirrhosis.   He has not tried Botox  therapy.  Seizure History:  He had an admission to the hospital for 10/17/2022 when he presented with psychosis.  Etiology was felt to be due to his medications, possibly the Keppra .  He notes that he was trying to lower the pregabalin  dose and he thinks all of his symptoms were due to lower it too quickly.  He had no change in the pregabalin  or Keppra  dose in the couple months before his admission.  In the hospital the Keppra  dose was changed and he was started on oxcarbazepine  600 mg twice a day.  Long-term video monitoring on 05/02/2023 had shown a seizure in the left frontal parietal region.  He was discharged on oxcarbazepine , Keppra  and pregabalin  .  We had tapered the Keppra  off.  In late 2025, he tapred the oxcarbaepine off.   Neuropathy/history of pernicious anemia:  He had pernicious anemia /  severe B12 deficiency around 2012.    He was started on B12 shots and the numbness improved but did not resolve.   Since that time his balance has been poor.  He was diagnosed with a small fiber polyneuropathy after a biopsy by Dr. Milton.   Neuropathic pain is better on combination of oxcarbazepine  and pregabalin  200 mg po tid. the pregabalin  has helped to reduce the pain.  He stopped the B12 shots we discussed getting back on.  RLS:  He also has restless syndrome and is fidgety in general in his legs.   Sometimes leg jerking wakes him up.   He has some anxiety and occasioanlly takes alprazolam .   He feels the alprazolam  at night helps him sleep better  Other:  He has cirrhosis followed by GI.   He has atrial flutter, treated with ablation with benefit.    He has benn found to have hypothyroid and used to be on synthroid .   He has a history of alcohol abuse (2 bottles of wine a day) but stopped a couple years ago and has not resumed.  IMAGING: MRI Brain 06/23/2012.  Shows some scattered T2/FLAIR hyperintense foci predominantly in the deep white matter.  There was one focus in the cerebellum also.  This is a nonspecific finding and most likely represents mild chronic microvascular ischemic change.  The lesions do not have a demyelinating appearance.  There were no acute findings.  MRI Brain 05/27/2017 showed a few scattered small T2/FLAIR hyperintense foci in the cerebral hemispheres consistent with minimal age-related chronic microvascular ischemic change.  No definite change compared to the 06/23/2012 MRI.  Normal enhancement pattern.  The left vertebral artery is dominant and tortuous.  MRI cervical spine 05/27/2017 shows a normal spinal cord.  There are mild degenerative changes at C3-C4 through C6-C7 but no spinal stenosis or nerve root compression.  MRi brain/iAC 02/24/2022 showed: FINDINGS: Brain: Negative for retrocochlear mass or inflammatory type enhancement. The left  vertebral artery is dominant and torturous, traversing the left CP angle cistern. The left AICA passes in close proximity to the cisternal left vestibulocochlear nerve complex with branch that extends medial and then inferior with contact of the left facial nerve root entry zone. Normal appearance of the brainstem.   Few remote white matter insults with nonspecific pattern. No acute or subacute infarct, hemorrhage, hydrocephalus, or collection.   Vascular: Normal flow voids.   Skull and upper cervical spine: Normal marrow signal.   Sinuses/Orbits: Negative.   IMPRESSION: Negative for mass or neuritic enhancement. Arterial anatomy at the left facial nerve root entry zone described above.  NCV 07/21/2022:    There is mild left-sided median neuropathy across the wrist. Patient declined EMG/Needle study. Patient declined testing of his right arm   REVIEW OF SYSTEMS: Constitutional: No fevers, chills, sweats, or change in appetite.     Eyes: No visual changes, double vision, eye pain Ear, nose and throat: No hearing loss, ear pain, nasal congestion, sore throat Cardiovascular: No chest pain, palpitations Respiratory:  No shortness of breath at rest or with exertion.   No wheezes GastrointestinaI: No nausea, vomiting, diarrhea, abdominal pain, fecal incontinence Genitourinary:  No dysuria, urinary retention or frequency.  No nocturia. Musculoskeletal:  No neck pain, back pain Integumentary: No rash, pruritus, skin lesions Neurological: as above Psychiatric: No depression at this time.  No anxiety Endocrine: No palpitations, diaphoresis, change in appetite, change in weigh or increased thirst Hematologic/Lymphatic:  No anemia, purpura, petechiae. Allergic/Immunologic: No itchy/runny eyes, nasal congestion, recent allergic reactions, rashes  ALLERGIES: Allergies  Allergen Reactions   Almecillin Anaphylaxis   Amitriptyline Anaphylaxis   Amoxicillin Anaphylaxis and Rash   Asa  [Aspirin] Anaphylaxis   Penicillin G Anaphylaxis   Hydrochlorothiazide Other (See Comments)    Caused rectal bleeding and hematuria   Trintellix  [Vortioxetine ] Nausea And Vomiting   Clonidine Derivatives Other (See Comments)    Severe Dry Mouth   Levothyroxine  Nausea And Vomiting    Patient states that the 75 mcg is too strong for him.    HOME MEDICATIONS:  Current Outpatient Medications:    ALPRAZolam  (XANAX ) 1 MG tablet, TAKE 1 TABLET BY MOUTH DAILY AS NEEDED, Disp: 30 tablet, Rfl: 5   carvedilol (COREG) 3.125 MG tablet, Take 3.125 mg by mouth 2 (two) times daily with a meal., Disp: , Rfl:    clotrimazole  (LOTRIMIN ) 1 % cream, Apply topically 2 (two) times daily., Disp: 90 g, Rfl: 3   cyanocobalamin  (VITAMIN B12) 1000 MCG/ML injection, Inject 1 mL (1,000 mcg total) into the muscle every 30 (thirty) days., Disp: 1 mL, Rfl: 11   diltiazem  (CARDIZEM  CD) 180 MG 24 hr capsule, Take 1 capsule (180 mg total) by mouth daily., Disp: 90 capsule, Rfl: 1   ondansetron  (ZOFRAN ) 4 MG tablet, Take 1 tablet (4 mg total) by mouth every 8 (eight) hours as  needed for nausea or vomiting., Disp: 40 tablet, Rfl: 0   pantoprazole  (PROTONIX ) 40 MG tablet, Take 1 tablet (40 mg total) by mouth daily before breakfast., Disp: 90 tablet, Rfl: 0   pregabalin  (LYRICA ) 150 MG capsule, TAKE 1 CAPSULE BY MOUTH THREE TIMES DAILY, Disp: 90 capsule, Rfl: 0   olopatadine  (PATADAY ) 0.1 % ophthalmic solution, Place 1 drop into both eyes 2 (two) times daily. (Patient not taking: Reported on 01/13/2024), Disp: 5 mL, Rfl: 12   thiamine  (VITAMIN B-1) 100 MG tablet, Take 1 tablet (100 mg total) by mouth daily. (Patient not taking: Reported on 01/13/2024), Disp: 30 tablet, Rfl: 0  PAST MEDICAL HISTORY: Past Medical History:  Diagnosis Date   Alcohol abuse    Allergy    Anemia    Anxiety    Arthritis    Cirrhosis (HCC)    Colon cancer (HCC)    Depression    Dysrhythmia    Gall stones    Heart burn    History of kidney  stones    Hyperlipidemia    Hypertension    Hypothyroidism    Liver disease    Pernicious anemia    Seizures (HCC)    Per patient last seizure 04/2023 while cutting back on Lyrica    Tachycardia    Typical atrial flutter (HCC)    Vitamin B12 deficiency     PAST SURGICAL HISTORY: Past Surgical History:  Procedure Laterality Date   A-FLUTTER ABLATION N/A 07/31/2016   Procedure: A-Flutter Ablation;  Surgeon: Kelsie Agent, MD;  Location: [facility];  Service: Cardiovascular;  Laterality: N/A;   BIOPSY  05/08/2022   Procedure: BIOPSY;  Surgeon: Charlanne Groom, MD;  Location: [facility];  Service: Gastroenterology;;   ORIN MEDIATE RELEASE Left 07/22/2023   Procedure: CARPAL TUNNEL RELEASE;  Surgeon: Jerri Kay HERO, MD;  Location: [facility];  Service: Orthopedics;  Laterality: Left;   COLONOSCOPY N/A 04/17/2016   Procedure: COLONOSCOPY;  Surgeon: Claudis RAYMOND Rivet, MD;  [facility];  Service: Endoscopy;  Laterality: N/A;  1200   COLONOSCOPY N/A 05/07/2017   Procedure: COLONOSCOPY;  Surgeon: Rivet Claudis RAYMOND, MD;  Location: [facility];  Service: Endoscopy;  Laterality: N/A;  200   COLONOSCOPY WITH PROPOFOL  N/A 07/25/2020   Procedure: COLONOSCOPY WITH PROPOFOL ;  Surgeon: Rivet Claudis RAYMOND, MD;  [facility];  Service: Endoscopy;  Laterality: N/A;  12:10   ESOPHAGEAL BANDING  07/25/2020   Procedure: ESOPHAGEAL BANDING;  Surgeon: Rivet Claudis RAYMOND, MD;  [facility]: [facility];  Service: Endoscopy;;   ESOPHAGOGASTRODUODENOSCOPY (EGD) WITH PROPOFOL  N/A 07/25/2020   Procedure: ESOPHAGOGASTRODUODENOSCOPY (EGD) WITH PROPOFOL ;  Surgeon: Rivet Claudis RAYMOND, MD;  [facility];  Service: Endoscopy;  Laterality: N/A;   ESOPHAGOGASTRODUODENOSCOPY (EGD) WITH PROPOFOL  N/A 04/11/2021   Procedure: ESOPHAGOGASTRODUODENOSCOPY (EGD) WITH PROPOFOL ;  Surgeon: Rivet Claudis RAYMOND, MD;  [facility];  Service: Endoscopy;  Laterality: N/A;  1:00    ESOPHAGOGASTRODUODENOSCOPY (EGD) WITH PROPOFOL  N/A 05/08/2022   Procedure: ESOPHAGOGASTRODUODENOSCOPY (EGD) WITH PROPOFOL ;  Surgeon: Charlanne Groom, MD;  Location: [facility];  Service: Gastroenterology;  Laterality: N/A;   implantable loop recorder placement  01/24/2019    Medtronic Reveal Linq model LNQ 11 implantable loop recorder (SN [id number] ) by Dr Kelsie for evaluation of palpitations   MINOR CARPAL TUNNEL     right   POLYPECTOMY  04/17/2016   Procedure: POLYPECTOMY;  Surgeon: Claudis RAYMOND Rivet, MD;  [facility];  Service: Endoscopy;;  sigmoid   POLYPECTOMY  07/25/2020   Procedure: POLYPECTOMY;  Surgeon: Golda Claudis PENNER, MD;  [facility];  Service: Endoscopy;;    FAMILY HISTORY: Family History  Problem Relation Age of Onset   Alzheimer's disease Mother    Hyperlipidemia Mother    Gallbladder disease Mother    Heart disease Father    Coronary artery disease Father    Liver disease Father    Thyroid  disease Father    Hyperlipidemia Father    Heart attack Father    Cirrhosis Father    Gallbladder disease Sister    Coronary artery disease Paternal Grandfather    Colon cancer Neg Hx    Esophageal cancer Neg Hx    Rectal cancer Neg Hx    Stomach cancer Neg Hx     SOCIAL HISTORY:  Social History   Socioeconomic History   Marital status: Single    Spouse name: Not on file   Number of children: 0   Years of education: Not on file   Highest education level: Not on file  Occupational History   Occupation: retired/disabled  Tobacco Use   Smoking status: Former    Current packs/day: 0.00    Types: Cigarettes    Quit date: 03/03/1979    Years since quitting: 44.8   Smokeless tobacco: Never  Vaping Use   Vaping status: Never Used  Substance and Sexual Activity   Alcohol use: Not Currently    Alcohol/week: 8.0 standard drinks of alcohol    Types: 8 Cans of beer per week    Comment: every other day   Drug use: No    Comment: THC, last use 08/06/23    Sexual activity: Not on file  Other Topics Concern   Not on file  Social History Narrative   Disabled   Lives in [place]   Social Drivers of Health   Tobacco Use: Medium Risk (01/13/2024)   Patient History    Smoking Tobacco Use: Former    Smokeless Tobacco Use: Never    Passive Exposure: Not on file  Financial Resource Strain: Medium Risk (04/25/2023)   Overall Financial Resource Strain (CARDIA)    Difficulty of Paying Living Expenses: Somewhat hard  Food Insecurity: No Food Insecurity (05/06/2023)   Hunger Vital Sign    Worried About Running Out of Food in the Last Year: Never true    Ran Out of Food in the Last Year: Never true  Recent Concern: Food Insecurity - Food Insecurity Present (05/01/2023)   Hunger Vital Sign    Worried About Running Out of Food in the Last Year: Sometimes true    Ran Out of Food in the Last Year: Never true  Transportation Needs: No Transportation Needs (05/06/2023)   PRAPARE - Administrator, Civil Service (Medical): No    Lack of Transportation (Non-Medical): No  Physical Activity: Sufficiently Active (04/25/2023)   Exercise Vital Sign    Days of Exercise per Week: 7 days    Minutes of Exercise per Session: 150+ min  Stress: Stress Concern Present (04/25/2023)   [employer] of Occupational Health - Occupational Stress Questionnaire    Feeling of Stress : Very much  Social Connections: Socially Isolated (04/25/2023)   Social Connection and Isolation Panel    Frequency of Communication with Friends and Family: More than three times a week    Frequency of Social Gatherings with Friends and Family: Never    Attends Religious Services: Never    Active Member  of Clubs or Organizations: No    Attends Banker Meetings: Not on file    Marital Status: Never married  Intimate Partner Violence: Not At Risk (05/06/2023)   Humiliation, Afraid, Rape, and Kick questionnaire    Fear of Current or Ex-Partner: No    Emotionally Abused: No     Physically Abused: No    Sexually Abused: No  Depression (PHQ2-9): Low Risk (10/29/2023)   Depression (PHQ2-9)    PHQ-2 Score: 0  Alcohol Screen: Not on file  Housing: Low Risk (05/06/2023)   Housing Stability Vital Sign    Unable to Pay for Housing in the Last Year: No    Number of Times Moved in the Last Year: 0    Homeless in the Last Year: No  Utilities: Not At Risk (05/06/2023)   AHC Utilities    Threatened with loss of utilities: No  Health Literacy: Not on file     PHYSICAL EXAM  Vitals:   01/13/24 1256  BP: 117/74  Pulse: (!) 48  SpO2: 99%  Weight: 213 lb 8 oz (96.8 kg)  Height: 5' 10 (1.778 m)     Body mass index is 30.63 kg/m.   General: The patient is well-developed and well-nourished and in no acute distress  Eyes:  Funduscopic exam shows normal optic discs and retinal vessels.  Neck: The neck is supple, no carotid bruits are noted.  The neck is nontender.  Cardiovascular: The heart has a regular rate and rhythm with a normal S1 and S2. There were no murmurs, gallops or rubs.    Skin: Extremities are without significant edema.  Musculoskeletal:  Back is nontender  Neurologic Exam  Mental status: The patient is alert and oriented x 3 at the time of the examination. The patient has apparent normal recent and remote memory, with an apparently normal attention span and concentration ability.   Speech is normal.  Cranial nerves: Extraocular movements are full. Pupils are equal, round, and reactive to light and accomodation.  Visual fields are full.  Minimal left ptosis.  Of note, there is evidence of cross reinnervation with eye closure upon smiling and the left corner of the mouth going up when he closes his eyes.  Frequent left hemifacial spasms are noted.  He has good facial sensation to soft touch bilaterally  No obvious hearing deficits are noted.  Motor:  Muscle bulk is normal.   Tone is normal. Strength is  5 / 5 in all 4 extremities.   Sensory:  Sensory testing shows reduced sensation to pinprick in 4th/5th fingers.   He has a Tinel's sign at left wrist     Phalens sign to left.  He has mild reduced vibration sensation at ankles and toes.   Feels touch on feet.    Coordination: Cerebellar testing reveals good finger-nose-finger and heel-to-shin bilaterally.  Gait and station: Station is normal.   Gait is normal. Tandem gait is wide. Romberg is negative.   Reflexes: Deep tendon reflexes are symmetric and normal bilaterally.        DIAGNOSTIC DATA (LABS, IMAGING, TESTING) - I reviewed patient records, labs, notes, testing and imaging myself where available.  Lab Results  Component Value Date   WBC 5.4 10/29/2023   HGB 15.2 10/29/2023   HCT 46.2 10/29/2023   MCV 95 10/29/2023   PLT 119 (L) 10/29/2023      Component Value Date/Time   NA 142 10/29/2023 1415   K 5.1 10/29/2023 1415   CL  105 10/29/2023 1415   CO2 25 10/29/2023 1415   GLUCOSE 63 (L) 10/29/2023 1415   GLUCOSE 87 06/05/2023 1550   BUN 22 10/29/2023 1415   CREATININE 1.00 10/29/2023 1415   CREATININE 0.83 07/18/2020 1304   CALCIUM  8.8 10/29/2023 1415   PROT 6.4 10/29/2023 1415   ALBUMIN 4.0 10/29/2023 1415   AST 17 10/29/2023 1415   ALT 15 10/29/2023 1415   ALKPHOS 90 10/29/2023 1415   BILITOT 0.6 10/29/2023 1415   GFRNONAA >60 05/08/2023 0732   GFRNONAA 71 09/06/2019 1313   GFRAA 82 09/06/2019 1313   Lab Results  Component Value Date   CHOL 129 09/05/2022   HDL 37 (L) 09/05/2022   LDLCALC 76 09/05/2022   TRIG 79 09/05/2022   CHOLHDL 3.5 09/05/2022   Lab Results  Component Value Date   HGBA1C 4.5 (L) 07/26/2019   Lab Results  Component Value Date   VITAMINB12 365 06/18/2023   Lab Results  Component Value Date   TSH 2.490 09/05/2022       ASSESSMENT AND PLAN  Seizures (HCC)  Neuropathy  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   He will stay on Lyrica  150 mg po bid.    We discussed Botox  for the HFS if this worsens.   Likely not candidate for Neurosurgery for decompression of left 7th nerve (AICA may be  compressing) due to cirrhosis B12 shots with the pharmacist. Rtc 6 months or sooner if new or worsening issues.    Dennies Coate A. Vear, MD, [facility] 01/13/2024, 1:45 PM Certified in Neurology, Clinical Neurophysiology, Sleep Medicine, Pain Medicine and Neuroimaging  [facility] [address], [address] [place], [place] [address] [phone]

## 2024-02-09 ENCOUNTER — Telehealth: Payer: Self-pay | Admitting: Family

## 2024-02-09 ENCOUNTER — Other Ambulatory Visit: Payer: Self-pay | Admitting: Family

## 2024-02-09 DIAGNOSIS — E559 Vitamin D deficiency, unspecified: Secondary | ICD-10-CM

## 2024-02-09 MED ORDER — VITAMIN D (ERGOCALCIFEROL) 1.25 MG (50000 UNIT) PO CAPS: 50000.0000 [IU] | ORAL_CAPSULE | ORAL | 1 refills | Status: DC

## 2024-02-09 NOTE — Telephone Encounter (Signed)
 Copied from CRM #[id number]. Topic: Clinical - Medication Refill >> Feb 09, 2024  8:57 AM Joesph B wrote: Medication: Ergocalciferol  1.25 MG (50000 UNIT) TAKE ONE CAPSULE BY MOUTH EVERY 7 DAYS  Has the patient contacted their pharmacy? Yes (Agent: If no, request that the patient contact the pharmacy for the refill. If patient does not wish to contact the pharmacy document the reason why and proceed with request.) (Agent: If yes, when and what did the pharmacy advise?)  This is the patient's preferred pharmacy:  [facility] - [place], [place] - [address] [place] [address] Phone: [phone] Fax: [phone]    Is this the correct pharmacy for this prescription? Yes If no, delete pharmacy and type the correct one.   Has the prescription been filled recently? Yes  Is the patient out of the medication? Yes  Has the patient been seen for an appointment in the last year OR does the patient have an upcoming appointment? Yes  Can we respond through MyChart? Yes  Agent: Please be advised that Rx refills may take up to 3 business days. We ask that you follow-up with your pharmacy.

## 2024-02-09 NOTE — Telephone Encounter (Signed)
"  Unable to pend  "

## 2024-02-09 NOTE — Telephone Encounter (Signed)
 Prescription sent to pharmacy.

## 2024-02-09 NOTE — Telephone Encounter (Signed)
 Patient aware and verbalized understanding.

## 2024-02-09 NOTE — Telephone Encounter (Signed)
 Not on patients meds list can not send please advise if you would like sent in for patient?

## 2024-02-12 ENCOUNTER — Telehealth: Payer: Self-pay | Admitting: Family

## 2024-02-12 MED ORDER — VITAMIN D (ERGOCALCIFEROL) 1.25 MG (50000 UNIT) PO CAPS: 50000.0000 [IU] | ORAL_CAPSULE | ORAL | 1 refills | Status: DC

## 2024-02-12 MED ORDER — FUROSEMIDE 20 MG PO TABS: 20.0000 mg | ORAL_TABLET | Freq: Every day | ORAL | 3 refills | Status: DC

## 2024-02-12 NOTE — Telephone Encounter (Signed)
 Vit D and Lasix  Prescription sent to pharmacy

## 2024-02-12 NOTE — Telephone Encounter (Signed)
 Copied from CRM [id number]. Topic: Clinical - Prescription Issue >> Feb 12, 2024 10:06 AM Diannia H wrote: Reason for CRM: Patient called and stated Vitamin-D2 was sent in to the pharmacy and he is not able to take that because it upsets his stomach. Could you resend a rx for Vitamin-D3 to: [facility] - [place], [place] - [address] [place] [address] Phone: [phone] Fax: [phone] Hours: Not open 24 hours  He also has some questions about some more of his medicine (furosemide  (LASIX ) 20 MG) running out to fast could he get more because sometimes he takes more than 1 a day. Could you assist? Patients callback number is [phone]

## 2024-02-18 ENCOUNTER — Encounter: Payer: Self-pay | Admitting: Family

## 2024-02-18 ENCOUNTER — Ambulatory Visit: Payer: MEDICAID | Admitting: Family

## 2024-02-18 VITALS — BP 103/55 | HR 43 | Temp 97.9°F | Ht 70.0 in | Wt 216.6 lb

## 2024-02-18 DIAGNOSIS — F1011 Alcohol abuse, in remission: Secondary | ICD-10-CM

## 2024-02-18 DIAGNOSIS — C187 Malignant neoplasm of sigmoid colon: Secondary | ICD-10-CM

## 2024-02-18 DIAGNOSIS — K7469 Other cirrhosis of liver: Secondary | ICD-10-CM | POA: Diagnosis not present

## 2024-02-18 DIAGNOSIS — E663 Overweight: Secondary | ICD-10-CM | POA: Diagnosis not present

## 2024-02-18 DIAGNOSIS — E559 Vitamin D deficiency, unspecified: Secondary | ICD-10-CM | POA: Diagnosis not present

## 2024-02-18 DIAGNOSIS — F339 Major depressive disorder, recurrent, unspecified: Secondary | ICD-10-CM

## 2024-02-18 DIAGNOSIS — I10 Essential (primary) hypertension: Secondary | ICD-10-CM

## 2024-02-18 DIAGNOSIS — K7031 Alcoholic cirrhosis of liver with ascites: Secondary | ICD-10-CM | POA: Diagnosis not present

## 2024-02-18 DIAGNOSIS — K219 Gastro-esophageal reflux disease without esophagitis: Secondary | ICD-10-CM

## 2024-02-18 DIAGNOSIS — I480 Paroxysmal atrial fibrillation: Secondary | ICD-10-CM

## 2024-02-18 DIAGNOSIS — E538 Deficiency of other specified B group vitamins: Secondary | ICD-10-CM | POA: Diagnosis not present

## 2024-02-18 DIAGNOSIS — I471 Supraventricular tachycardia, unspecified: Secondary | ICD-10-CM

## 2024-02-18 DIAGNOSIS — E782 Mixed hyperlipidemia: Secondary | ICD-10-CM | POA: Diagnosis not present

## 2024-02-18 MED ORDER — VITAMIN D3 125 MCG (5000 UT) PO CAPS: 5000.0000 [IU] | ORAL_CAPSULE | Freq: Every day | ORAL | 1 refills | Status: AC

## 2024-02-18 MED ORDER — FUROSEMIDE 20 MG PO TABS: 20.0000 mg | ORAL_TABLET | Freq: Every day | ORAL | 3 refills | Status: DC

## 2024-02-18 MED ORDER — CARVEDILOL 3.125 MG PO TABS: 3.1250 mg | ORAL_TABLET | Freq: Two times a day (BID) | ORAL | 2 refills | Status: AC

## 2024-02-18 MED ORDER — PANTOPRAZOLE SODIUM 40 MG PO TBEC: 40.0000 mg | DELAYED_RELEASE_TABLET | Freq: Every day | ORAL | 0 refills | Status: AC

## 2024-02-18 NOTE — Progress Notes (Signed)
 "  Subjective:    Patient ID: Earl [name], male    DOB: [birth year], 64 y.o.   MRN: [id number]  Chief Complaint  Patient presents with   Medical Management of Chronic Issues    3 mos, med RF   Pt presents to the office today for chronic follow up.   He is followed by GI for alcoholic cirrhosis and malignant neoplasm of colon.  He is followed by Cardiologists for NSVT and atrial flutter.   He is followed by Neurologists for neuropathy who gives him Lyrica . He is trying to tamper this down. Currently taking 450 mg daily.    He states he stopped drinking alcohol 12/09/21.   He is currently taking lasix  20 mg  as needed when he gains fluid.   He had lost 75 lbs  through dieting  and working out daily. States he is walking 3 miles a day and weight lifting.  His weight is stable. Now.      02/18/2024    2:00 PM 01/13/2024   12:56 PM 11/30/2023    1:46 PM  Last 3 Weights  Weight (lbs) 216 lb 9.6 oz 213 lb 8 oz 211 lb  Weight (kg) 98.249 kg 96.843 kg 95.709 kg      Hypertension This is a chronic problem. The current episode started more than 1 year ago. The problem has been resolved since onset. The problem is controlled. Pertinent negatives include no malaise/fatigue, peripheral edema or shortness of breath. Risk factors for coronary artery disease include dyslipidemia, obesity, male gender and sedentary lifestyle. The current treatment provides moderate improvement. Identifiable causes of hypertension include a thyroid  problem.  Thyroid  Problem Presents for follow-up visit. Symptoms include dry skin. Patient reports no anxiety, constipation or fatigue. The symptoms have been stable.  Hyperlipidemia This is a chronic problem. The current episode started more than 1 year ago. The problem is controlled. Recent lipid tests were reviewed and are normal. Exacerbating diseases include obesity. Pertinent negatives include no shortness of breath. Current antihyperlipidemic treatment includes  diet change. The current treatment provides moderate improvement of lipids. Risk factors for coronary artery disease include dyslipidemia, hypertension, a sedentary lifestyle, male sex and obesity.  Depression        This is a chronic problem.  The current episode started more than 1 year ago.   The problem occurs intermittently.  Associated symptoms include no fatigue, no helplessness, no hopelessness and not sad.  Past treatments include nothing.  Past medical history includes thyroid  problem.   Gastroesophageal Reflux He complains of belching and heartburn. This is a chronic problem. The current episode started more than 1 year ago. The problem occurs occasionally. The symptoms are aggravated by certain foods. Pertinent negatives include no fatigue. He has tried a diet change and a PPI for the symptoms. The treatment provided moderate relief.     Review of Systems  Constitutional:  Negative for fatigue and malaise/fatigue.  Respiratory:  Negative for shortness of breath.   Gastrointestinal:  Positive for heartburn. Negative for constipation.  Psychiatric/Behavioral:  The patient is not nervous/anxious.   All other systems reviewed and are negative.  Family History  Problem Relation Age of Onset   Alzheimer's disease Mother    Hyperlipidemia Mother    Gallbladder disease Mother    Heart disease Father    Coronary artery disease Father    Liver disease Father    Thyroid  disease Father    Hyperlipidemia Father    Heart attack Father  Cirrhosis Father    Gallbladder disease Sister    Coronary artery disease Paternal Grandfather    Colon cancer Neg Hx    Esophageal cancer Neg Hx    Rectal cancer Neg Hx    Stomach cancer Neg Hx    Social History   Socioeconomic History   Marital status: Single    Spouse name: Not on file   Number of children: 0   Years of education: Not on file   Highest education level: Not on file  Occupational History   Occupation: retired/disabled   Tobacco Use   Smoking status: Former    Current packs/day: 0.00    Types: Cigarettes    Quit date: 03/03/1979    Years since quitting: 44.9   Smokeless tobacco: Never  Vaping Use   Vaping status: Never Used  Substance and Sexual Activity   Alcohol use: Not Currently    Alcohol/week: 8.0 standard drinks of alcohol    Types: 8 Cans of beer per week    Comment: every other day   Drug use: No    Comment: THC, last use 08/06/23   Sexual activity: Not on file  Other Topics Concern   Not on file  Social History Narrative   Disabled   Lives in [place]   Social Drivers of Health   Tobacco Use: Medium Risk (02/18/2024)   Patient History    Smoking Tobacco Use: Former    Smokeless Tobacco Use: Never    Passive Exposure: Not on file  Financial Resource Strain: Medium Risk (04/25/2023)   Overall Financial Resource Strain (CARDIA)    Difficulty of Paying Living Expenses: Somewhat hard  Food Insecurity: No Food Insecurity (05/06/2023)   Hunger Vital Sign    Worried About Running Out of Food in the Last Year: Never true    Ran Out of Food in the Last Year: Never true  Recent Concern: Food Insecurity - Food Insecurity Present (05/01/2023)   Hunger Vital Sign    Worried About Running Out of Food in the Last Year: Sometimes true    Ran Out of Food in the Last Year: Never true  Transportation Needs: No Transportation Needs (05/06/2023)   PRAPARE - Administrator, Civil Service (Medical): No    Lack of Transportation (Non-Medical): No  Physical Activity: Sufficiently Active (04/25/2023)   Exercise Vital Sign    Days of Exercise per Week: 7 days    Minutes of Exercise per Session: 150+ min  Stress: Stress Concern Present (04/25/2023)   Earl [name]    Feeling of Stress : Very much  Social Connections: Socially Isolated (04/25/2023)   Social Connection and Isolation Panel    Frequency of Communication with Friends and Family:  More than three times a week    Frequency of Social Gatherings with Friends and Family: Never    Attends Religious Services: Never    Database Administrator or Organizations: No    Attends Engineer, Structural: Not on file    Marital Status: Never married  Depression (PHQ2-9): Low Risk (02/18/2024)   Depression (PHQ2-9)    PHQ-2 Score: 0  Alcohol Screen: Not on file  Housing: Low Risk (05/06/2023)   Housing Stability Vital Sign    Unable to Pay for Housing in the Last Year: No    Number of Times Moved in the Last Year: 0    Homeless in the Last Year: No  Utilities: Not At Risk (  05/06/2023)   [facility] Utilities    Threatened with loss of utilities: No  Health Literacy: Not on file       Objective:   Physical Exam Vitals reviewed.  Constitutional:      General: He is not in acute distress.    Appearance: He is well-developed. He is obese.  HENT:     Head: Normocephalic.     Right Ear: Tympanic membrane normal.     Left Ear: Tympanic membrane normal.  Eyes:     General:        Right eye: No discharge.        Left eye: No discharge.     Pupils: Pupils are equal, round, and reactive to light.  Neck:     Thyroid : No thyromegaly.  Cardiovascular:     Rate and Rhythm: Normal rate and regular rhythm.     Heart sounds: Normal heart sounds. No murmur heard. Pulmonary:     Effort: Pulmonary effort is normal. No respiratory distress.     Breath sounds: Normal breath sounds. No wheezing.  Abdominal:     General: Bowel sounds are normal. There is no distension.     Palpations: Abdomen is soft.     Tenderness: There is no abdominal tenderness.  Musculoskeletal:        General: No tenderness. Normal range of motion.     Cervical back: Normal range of motion and neck supple.  Skin:    General: Skin is warm and dry.     Findings: No erythema or rash.  Neurological:     Mental Status: He is alert and oriented to person, place, and time.     Cranial Nerves: No cranial nerve  deficit.     Deep Tendon Reflexes: Reflexes are normal and symmetric.     Comments: twitch of left eye  Psychiatric:        Behavior: Behavior normal.        Thought Content: Thought content normal.        Judgment: Judgment normal.    BP (!) 103/55   Pulse (!) 43   Temp 97.9 F (36.6 C) (Oral)   Ht 5' 10 (1.778 m)   Wt 216 lb 9.6 oz (98.2 kg)   SpO2 97%   BMI 31.08 kg/m      Assessment & Plan:  Earl [name] comes in today with chief complaint of Medical Management of Chronic Issues (3 mos, med RF)   Diagnosis and orders addressed:  1. Essential hypertension - carvedilol  (COREG ) 3.125 MG tablet; Take 1 tablet (3.125 mg total) by mouth 2 (two) times daily with a meal.  Dispense: 180 tablet; Refill: 2 - CMP14+EGFR - CBC with Differential/Platelet  2. SVT (supraventricular tachycardia) (Primary) - CMP14+EGFR - CBC with Differential/Platelet  3. Paroxysmal atrial fibrillation (HCC) - CMP14+EGFR - CBC with Differential/Platelet  4. Alcoholic cirrhosis of liver with ascites (HCC) - CMP14+EGFR - CBC with Differential/Platelet  5. Malignant neoplasm of sigmoid colon (HCC) - CMP14+EGFR - CBC with Differential/Platelet  6. Vitamin D  deficiency - Cholecalciferol (VITAMIN D3) 125 MCG (5000 UT) CAPS; Take 1 capsule (5,000 Units total) by mouth daily.  Dispense: 90 capsule; Refill: 1 - CMP14+EGFR - CBC with Differential/Platelet  7. Vitamin B 12 deficiency  - CMP14+EGFR - CBC with Differential/Platelet  8. Overweight (BMI 25.0-29.9) - CMP14+EGFR - CBC with Differential/Platelet  9. Alcohol abuse, in remission - CMP14+EGFR - CBC with Differential/Platelet  10. Depression, recurrent - CMP14+EGFR - CBC with Differential/Platelet  11. Mixed hyperlipidemia  - CMP14+EGFR - CBC with Differential/Platelet  12. Other cirrhosis of liver (HCC) - CMP14+EGFR - CBC with Differential/Platelet  13. Gastroesophageal reflux disease without esophagitis  -  pantoprazole  (PROTONIX ) 40 MG tablet; Take 1 tablet (40 mg total) by mouth daily before breakfast.  Dispense: 90 tablet; Refill: 0 - CMP14+EGFR - CBC with Differential/Platelet   Labs pending Keep follow up with specialists  Health Maintenance reviewed Diet and exercise encouraged  Follow up plan: 3 months   Bari Learn, FNP   "

## 2024-02-18 NOTE — Patient Instructions (Signed)
 Nerve Damage (Peripheral Neuropathy): What to Know Peripheral neuropathy happens when there's damage to the peripheral nerves. These nerves send signals between your spinal cord and your arms and legs. You can have damage in one or more nerves. What are the causes? There are many reasons why nerves can get damaged. Damage may be caused by some diseases, such as: Diabetes. This is the most common cause. Autoimmune diseases, such as rheumatoid arthritis or lupus. These happen when your body's defense system (immune system) attacks healthy parts of your body by mistake. Inherited nerve disease. This is passed down in families. Kidney disease. Thyroid disease. Other causes include: Pressure or stress on a nerve that lasts a long time. Lack of B vitamins from poor diet or drinking too much alcohol. Infections. Some medicines, like chemotherapy used for cancer treatment. Poisonous (toxic) substances, such as lead or mercury. Too little blood flowing to the legs. Sometimes, the cause isn't known. What are the signs or symptoms? Symptoms depend on which nerves are damaged. In your legs, hands, or arms, you may have: Loss of feeling (numbness). Tingling. Burning pain. Very sensitive skin. Weakness. Paralysis. This is when you can't move a part of your body. Clumsiness. Muscle twitching. Loss of balance. You may have trouble walking. Other symptoms can include: Not being able to control when you pee. Feeling dizzy. Problems during sex. How is this diagnosed? Your health care provider will: Ask about your symptoms and medical history. Do a physical exam. Do a neurological exam. They will check your reflexes, how you move, and what you can feel. You may need to have other tests, such as: Blood tests. Nerve tests to check how well your nerves and muscles work. Imaging tests, such as a CT scan or MRI. These are done to rule out other causes. Nerve biopsy. This is when a small piece of a  nerve is removed for testing. Lumbar puncture. A small amount of the fluid that surrounds the brain and spinal cord is taken and checked in a lab. How is this treated? Treatment may include: Treating the cause of the nerve damage. Pain medicines. Other medicines that may help with pain, such as: Medicines that treat seizures. Medicines that treat depression. Pain patches or creams that are put on painful areas of skin. TENS therapy. This uses a device that sends mild electrical pulses to your nerves. This will prevent pain signals from reaching the brain. Massage. Acupuncture. Surgery to relieve pressure on a nerve or to destroy a nerve that's causing pain. This is done only if the other treatments are not helping. You may also need physical therapy to help improve your movement, balance, and muscle strength. You may be told to use a cane or walker if needed. Follow these instructions at home: Medicines Take your medicines only as told. You may need to take steps to help treat or prevent trouble pooping (constipation), such as: Taking medicines to help you poop. Eating foods high in fiber, like beans, whole grains, and fresh fruits and vegetables. Drinking more fluids as told. Ask your provider if it's safe to drive or use machines while taking your medicine. Lifestyle  Do not smoke, vape, or use nicotine or tobacco. Avoid or limit alcohol. Too much alcohol can be harmful to nerves and cause damage. Eat a healthy diet. Safety  Take care to avoid burning or damaging your skin if you have numbness. If you have numbness in your feet: Check your feet each day for signs of injury  or infection. Watch for redness, warmth, and swelling. Wear padded socks and comfortable shoes. These help protect your feet. General instructions If you have diabetes, keep your blood sugar under control. Develop a good support system. Living with nerve damage can cause a lot of stress. Talk with a mental  health therapist or join a support group. Exercise as told. Ask what things are safe for you to do at home. Work with a pain specialist to find the best way to manage your pain. Keep all follow-up visits. Your provider will check if the treatments are working and change them if needed. Where to find support The Foundation for Peripheral Neuropathy: [employer]si Where to find more information To learn more, go to: [employer] of Neurological Disorders and Stroke at [employer]. Click Search and type peripheral neuropathy. Find the link you need. Contact a health care provider if: Your pain treatments are not working. Your symptoms are getting worse. You have side effects from any of your medicines. You have new symptoms. You're having a hard time coping with your condition. Get help right away if: You have a wound that's not healing. You have new weakness in an arm or leg. You've fallen or you fall often. This information is not intended to replace advice given to you by your health care provider. Make sure you discuss any questions you have with your health care provider. Document Revised: 04/09/2023 Document Reviewed: 04/09/2023 Elsevier Patient Education  2025 [employer].

## 2024-02-19 ENCOUNTER — Ambulatory Visit: Payer: Self-pay | Admitting: Family

## 2024-02-19 ENCOUNTER — Telehealth: Payer: Self-pay | Admitting: Family Medicine

## 2024-02-19 DIAGNOSIS — E875 Hyperkalemia: Secondary | ICD-10-CM

## 2024-02-19 LAB — CMP14+EGFR
ALT: 18 IU/L (ref 0–44)
AST: 17 IU/L (ref 0–40)
Albumin: 4.1 g/dL (ref 3.9–4.9)
Alkaline Phosphatase: 78 IU/L (ref 47–123)
BUN/Creatinine Ratio: 15 (ref 10–24)
BUN: 15 mg/dL (ref 8–27)
Bilirubin Total: 0.4 mg/dL (ref 0.0–1.2)
CO2: 25 mmol/L (ref 20–29)
Calcium: 9.4 mg/dL (ref 8.6–10.2)
Chloride: 110 mmol/L — AB (ref 96–106)
Creatinine, Ser: 0.99 mg/dL (ref 0.76–1.27)
Globulin, Total: 2.4 g/dL (ref 1.5–4.5)
Glucose: 77 mg/dL (ref 70–99)
Potassium: 5.4 mmol/L — AB (ref 3.5–5.2)
Sodium: 144 mmol/L (ref 134–144)
Total Protein: 6.5 g/dL (ref 6.0–8.5)
eGFR: 85 mL/min/1.73

## 2024-02-19 LAB — CBC WITH DIFFERENTIAL/PLATELET
Basophils Absolute: 0 x10E3/uL (ref 0.0–0.2)
Basos: 0 %
EOS (ABSOLUTE): 0.2 x10E3/uL (ref 0.0–0.4)
Eos: 5 %
Hematocrit: 43.7 % (ref 37.5–51.0)
Hemoglobin: 14.6 g/dL (ref 13.0–17.7)
Immature Grans (Abs): 0 x10E3/uL (ref 0.0–0.1)
Immature Granulocytes: 0 %
Lymphocytes Absolute: 1.8 x10E3/uL (ref 0.7–3.1)
Lymphs: 36 %
MCH: 32 pg (ref 26.6–33.0)
MCHC: 33.4 g/dL (ref 31.5–35.7)
MCV: 96 fL (ref 79–97)
Monocytes Absolute: 0.4 x10E3/uL (ref 0.1–0.9)
Monocytes: 8 %
Neutrophils Absolute: 2.6 x10E3/uL (ref 1.4–7.0)
Neutrophils: 51 %
Platelets: 103 x10E3/uL — ABNORMAL LOW (ref 150–450)
RBC: 4.56 x10E6/uL (ref 4.14–5.80)
RDW: 12 % (ref 11.6–15.4)
WBC: 4.9 x10E3/uL (ref 3.4–10.8)

## 2024-02-19 MED ORDER — FUROSEMIDE 20 MG PO TABS: 20.0000 mg | ORAL_TABLET | Freq: Every day | ORAL | 3 refills | Status: AC

## 2024-02-19 NOTE — Addendum Note (Signed)
 Addended by: LAVELL LYE A on: 02/19/2024 01:15 PM   Modules accepted: Orders

## 2024-02-19 NOTE — Telephone Encounter (Signed)
Fyi NOTED.

## 2024-02-19 NOTE — Telephone Encounter (Signed)
 Copied from CRM #[id number]. Topic: Clinical - Prescription Issue >> Feb 19, 2024 10:33 AM Jasmin G wrote: Reason for CRM: Pt requested for his prescription for furosemide  (LASIX ) 20 MG tablet to be resent to pharmacy as he states they have not received it, and requested for it to be a 60 or 90 day prescription as he stakes 2 pills a day.

## 2024-02-19 NOTE — Telephone Encounter (Signed)
 Prescription sent to pharmacy.

## 2024-02-19 NOTE — Telephone Encounter (Signed)
 Copied from CRM [id number]. Topic: Clinical - Lab/Test Results >> Feb 19, 2024 10:10 AM Susanna ORN wrote: Reason for CRM: Patient called back stating that he spoke with someone about his lab results and they made an appt for him to come back. He states that he used AI on his phone & he eats plenty of pinto beans and mashed potatoes daily. States that he thinks that his potassium being elevated is because of his diet. States he bet it goes down when he comes back. He just wanted to let the provider know that. >> Feb 19, 2024 10:53 AM Donna BRAVO wrote: Patient has been eating food that contains potassium and No Salt is pure potassium chloride .   Patient has many questions and would like to speak with Rosina,

## 2024-02-25 ENCOUNTER — Telehealth: Payer: Self-pay | Admitting: Family

## 2024-02-25 ENCOUNTER — Other Ambulatory Visit: Payer: MEDICAID

## 2024-02-25 DIAGNOSIS — E875 Hyperkalemia: Secondary | ICD-10-CM

## 2024-02-25 NOTE — Telephone Encounter (Signed)
 Pt checked in for labs and wanted provider to know that he was upset that he was told to stop eating salt. He found out that was more dangerous. He was upset and I offered to get nursing supervisor and he refused

## 2024-02-26 ENCOUNTER — Ambulatory Visit: Payer: Self-pay | Admitting: Family

## 2024-02-26 LAB — BMP8+EGFR
BUN/Creatinine Ratio: 14 (ref 10–24)
BUN: 16 mg/dL (ref 8–27)
CO2: 25 mmol/L (ref 20–29)
Calcium: 8.9 mg/dL (ref 8.6–10.2)
Chloride: 104 mmol/L (ref 96–106)
Creatinine, Ser: 1.11 mg/dL (ref 0.76–1.27)
Glucose: 77 mg/dL (ref 70–99)
Potassium: 4.1 mmol/L (ref 3.5–5.2)
Sodium: 144 mmol/L (ref 134–144)
eGFR: 74 mL/min/{1.73_m2}

## 2024-02-29 ENCOUNTER — Telehealth: Payer: Self-pay

## 2024-02-29 NOTE — Telephone Encounter (Signed)
 No answer and no vmail

## 2024-03-03 NOTE — Telephone Encounter (Signed)
 Contacted patient. Notified patient. Patient verbalized understanding.

## 2024-05-20 ENCOUNTER — Ambulatory Visit: Payer: MEDICAID | Admitting: Family

## 2024-09-13 ENCOUNTER — Ambulatory Visit: Payer: MEDICAID | Admitting: Neurology
